# Patient Record
Sex: Female | Born: 1935
Health system: Southern US, Community
[De-identification: ages and names within clinical notes are randomized; demographics above are authoritative.]

## PROBLEM LIST (undated history)

## (undated) DIAGNOSIS — M810 Age-related osteoporosis without current pathological fracture: Principal | ICD-10-CM

## (undated) DIAGNOSIS — I447 Left bundle-branch block, unspecified: Secondary | ICD-10-CM

## (undated) DIAGNOSIS — K219 Gastro-esophageal reflux disease without esophagitis: Secondary | ICD-10-CM

## (undated) DIAGNOSIS — Z87442 Personal history of urinary calculi: Secondary | ICD-10-CM

## (undated) DIAGNOSIS — M069 Rheumatoid arthritis, unspecified: Secondary | ICD-10-CM

## (undated) DIAGNOSIS — I341 Nonrheumatic mitral (valve) prolapse: Secondary | ICD-10-CM

## (undated) DIAGNOSIS — E039 Hypothyroidism, unspecified: Secondary | ICD-10-CM

## (undated) DIAGNOSIS — M1990 Unspecified osteoarthritis, unspecified site: Secondary | ICD-10-CM

## (undated) DIAGNOSIS — N201 Calculus of ureter: Secondary | ICD-10-CM

## (undated) DIAGNOSIS — L719 Rosacea, unspecified: Secondary | ICD-10-CM

## (undated) DIAGNOSIS — T8859XA Other complications of anesthesia, initial encounter: Secondary | ICD-10-CM

## (undated) DIAGNOSIS — R002 Palpitations: Secondary | ICD-10-CM

## (undated) DIAGNOSIS — T4145XA Adverse effect of unspecified anesthetic, initial encounter: Secondary | ICD-10-CM

## (undated) HISTORY — PX: BACK SURGERY: SHX140

## (undated) HISTORY — DX: Palpitations: R00.2

## (undated) HISTORY — PX: CATARACT EXTRACTION W/ INTRAOCULAR LENS  IMPLANT, BILATERAL: SHX1307

## (undated) HISTORY — PX: KNEE ARTHROSCOPY: SHX127

## (undated) HISTORY — PX: OTHER SURGICAL HISTORY: SHX169

## (undated) HISTORY — DX: Age-related osteoporosis without current pathological fracture: M81.0

## (undated) HISTORY — DX: Unspecified osteoarthritis, unspecified site: M19.90

---
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
## 1968-05-27 HISTORY — PX: VAGINAL HYSTERECTOMY: SUR661

## 1992-07-27 ENCOUNTER — Encounter (INDEPENDENT_AMBULATORY_CARE_PROVIDER_SITE_OTHER): Payer: Self-pay | Admitting: *Deleted

## 1994-09-20 ENCOUNTER — Encounter (INDEPENDENT_AMBULATORY_CARE_PROVIDER_SITE_OTHER): Payer: Self-pay | Admitting: *Deleted

## 1997-02-01 ENCOUNTER — Encounter (INDEPENDENT_AMBULATORY_CARE_PROVIDER_SITE_OTHER): Payer: Self-pay | Admitting: *Deleted

## 1997-03-01 ENCOUNTER — Encounter (INDEPENDENT_AMBULATORY_CARE_PROVIDER_SITE_OTHER): Payer: Self-pay | Admitting: *Deleted

## 1997-03-21 ENCOUNTER — Encounter (INDEPENDENT_AMBULATORY_CARE_PROVIDER_SITE_OTHER): Payer: Self-pay | Admitting: *Deleted

## 1998-05-23 ENCOUNTER — Other Ambulatory Visit: Admission: RE | Admit: 1998-05-23 | Discharge: 1998-05-23 | Payer: Self-pay | Admitting: Obstetrics and Gynecology

## 1998-09-23 ENCOUNTER — Ambulatory Visit (HOSPITAL_COMMUNITY): Admission: RE | Admit: 1998-09-23 | Discharge: 1998-09-23 | Payer: Self-pay | Admitting: Neurosurgery

## 1998-09-23 ENCOUNTER — Encounter: Payer: Self-pay | Admitting: Neurosurgery

## 1999-05-31 ENCOUNTER — Other Ambulatory Visit: Admission: RE | Admit: 1999-05-31 | Discharge: 1999-05-31 | Payer: Self-pay | Admitting: Obstetrics and Gynecology

## 2000-09-22 ENCOUNTER — Other Ambulatory Visit: Admission: RE | Admit: 2000-09-22 | Discharge: 2000-09-22 | Payer: Self-pay | Admitting: Obstetrics and Gynecology

## 2001-07-16 ENCOUNTER — Ambulatory Visit (HOSPITAL_COMMUNITY): Admission: RE | Admit: 2001-07-16 | Discharge: 2001-07-16 | Payer: Self-pay | Admitting: Internal Medicine

## 2001-09-07 ENCOUNTER — Ambulatory Visit (HOSPITAL_COMMUNITY): Admission: RE | Admit: 2001-09-07 | Discharge: 2001-09-07 | Payer: Self-pay | Admitting: Internal Medicine

## 2001-09-07 ENCOUNTER — Encounter: Payer: Self-pay | Admitting: Internal Medicine

## 2003-05-28 HISTORY — PX: CHOLECYSTECTOMY: SHX55

## 2004-05-27 DIAGNOSIS — I341 Nonrheumatic mitral (valve) prolapse: Secondary | ICD-10-CM

## 2004-05-27 HISTORY — DX: Nonrheumatic mitral (valve) prolapse: I34.1

## 2005-02-20 ENCOUNTER — Ambulatory Visit: Payer: Self-pay | Admitting: Internal Medicine

## 2005-08-21 ENCOUNTER — Other Ambulatory Visit: Admission: RE | Admit: 2005-08-21 | Discharge: 2005-08-21 | Payer: Self-pay | Admitting: Obstetrics and Gynecology

## 2005-11-06 ENCOUNTER — Encounter: Payer: Self-pay | Admitting: Internal Medicine

## 2005-11-06 ENCOUNTER — Ambulatory Visit (HOSPITAL_COMMUNITY): Admission: RE | Admit: 2005-11-06 | Discharge: 2005-11-06 | Payer: Self-pay | Admitting: Internal Medicine

## 2005-11-06 ENCOUNTER — Ambulatory Visit: Payer: Self-pay | Admitting: Internal Medicine

## 2006-08-25 ENCOUNTER — Other Ambulatory Visit: Admission: RE | Admit: 2006-08-25 | Discharge: 2006-08-25 | Payer: Self-pay | Admitting: Obstetrics and Gynecology

## 2007-01-21 ENCOUNTER — Ambulatory Visit (HOSPITAL_COMMUNITY): Admission: RE | Admit: 2007-01-21 | Discharge: 2007-01-21 | Payer: Self-pay | Admitting: Rheumatology

## 2007-03-02 ENCOUNTER — Ambulatory Visit (HOSPITAL_COMMUNITY): Admission: RE | Admit: 2007-03-02 | Discharge: 2007-03-02 | Payer: Self-pay | Admitting: Rheumatology

## 2007-09-08 ENCOUNTER — Encounter: Admission: RE | Admit: 2007-09-08 | Discharge: 2007-09-08 | Payer: Self-pay | Admitting: Neurosurgery

## 2008-02-22 ENCOUNTER — Ambulatory Visit (HOSPITAL_COMMUNITY): Admission: RE | Admit: 2008-02-22 | Discharge: 2008-02-22 | Payer: Self-pay | Admitting: Rheumatology

## 2008-07-20 ENCOUNTER — Ambulatory Visit: Payer: Self-pay | Admitting: Obstetrics and Gynecology

## 2009-02-28 ENCOUNTER — Ambulatory Visit: Payer: Self-pay | Admitting: Obstetrics and Gynecology

## 2009-03-02 ENCOUNTER — Ambulatory Visit: Payer: Self-pay | Admitting: Obstetrics and Gynecology

## 2009-04-13 ENCOUNTER — Ambulatory Visit: Payer: Self-pay | Admitting: Obstetrics and Gynecology

## 2009-06-08 ENCOUNTER — Ambulatory Visit: Payer: Self-pay | Admitting: Obstetrics and Gynecology

## 2009-06-28 ENCOUNTER — Ambulatory Visit (HOSPITAL_COMMUNITY): Admission: RE | Admit: 2009-06-28 | Discharge: 2009-06-28 | Payer: Self-pay | Admitting: Rheumatology

## 2009-12-05 ENCOUNTER — Other Ambulatory Visit: Admission: RE | Admit: 2009-12-05 | Discharge: 2009-12-05 | Payer: Self-pay | Admitting: Obstetrics and Gynecology

## 2009-12-05 ENCOUNTER — Ambulatory Visit: Payer: Self-pay | Admitting: Obstetrics and Gynecology

## 2010-01-01 ENCOUNTER — Ambulatory Visit: Payer: Self-pay | Admitting: Obstetrics and Gynecology

## 2010-01-22 ENCOUNTER — Ambulatory Visit: Payer: Self-pay | Admitting: Internal Medicine

## 2010-01-22 DIAGNOSIS — K219 Gastro-esophageal reflux disease without esophagitis: Secondary | ICD-10-CM | POA: Insufficient documentation

## 2010-01-22 DIAGNOSIS — R1031 Right lower quadrant pain: Secondary | ICD-10-CM

## 2010-01-22 DIAGNOSIS — R1310 Dysphagia, unspecified: Secondary | ICD-10-CM | POA: Insufficient documentation

## 2010-01-22 DIAGNOSIS — K5900 Constipation, unspecified: Secondary | ICD-10-CM | POA: Insufficient documentation

## 2010-03-27 ENCOUNTER — Ambulatory Visit: Payer: Self-pay | Admitting: Internal Medicine

## 2010-03-30 ENCOUNTER — Encounter: Payer: Self-pay | Admitting: Internal Medicine

## 2010-05-27 HISTORY — PX: OTHER SURGICAL HISTORY: SHX169

## 2010-06-01 ENCOUNTER — Ambulatory Visit
Admission: RE | Admit: 2010-06-01 | Discharge: 2010-06-01 | Payer: Self-pay | Source: Home / Self Care | Attending: Obstetrics and Gynecology | Admitting: Obstetrics and Gynecology

## 2010-06-17 ENCOUNTER — Encounter: Payer: Self-pay | Admitting: Rheumatology

## 2010-06-26 NOTE — Assessment & Plan Note (Signed)
Summary: LOWER ABD PAIN/SHARP AT TIMES/YF   History of Present Illness Visit Type: Initial Consult Primary GI MD: Yancey Flemings MD Primary Provider: Charissa Bash, MD Requesting Provider: Edyth Gunnels, MD Chief Complaint: Abdominal pain, rt sided & lower History of Present Illness:   77 y.o. female w/ hypothyroidism, afib, osteoporosis, kindey stones, and GERD w/ peptic stricture. Remote GI care here [facility] '98) and more recent with Dr. Karilyn Cota in [place] ( Colonoscopy and EGD / dilation in 2003 and again in 2007 - REPORTS REVIEWED). Family hx colon cancer (mom 39 and aunt). Now sent re chronic right sided pain, recurrent dyspagia, and the need for colonoscopy. Could not get in to see Dr. Karilyn Cota until October, so sent here. Has had RLQ and right low back pain for several years. Gyn, GU, and NSU evals negative for cause. Small right ovary lesion stable and being followed - OUTSIDE RECORDS AND [place] REVIEWED. No exacerbating factors. Pain occurs most days. BM seems to help. Description is somewhat vague, though occasionally sharp.No polyps on prior colonoscopies. Tends to be constipated.. Takes Aciphex daily for GERD and Prev OTC for breakthrough. Now w/ recuurent solid food dysphagia for several months. Chronic medical problems stable.   GI Review of Systems    Reports abdominal pain, acid reflux, dysphagia with solids, and  nausea.     Location of  Abdominal pain: RLQ and back.    Denies belching, bloating, chest pain, dysphagia with liquids, heartburn, loss of appetite, vomiting, vomiting blood, weight loss, and  weight gain.      Reports constipation, diarrhea, and  hemorrhoids.     Denies anal fissure, black tarry stools, change in bowel habit, diverticulosis, fecal incontinence, heme positive stool, irritable bowel syndrome, jaundice, light color stool, liver problems, rectal bleeding, and  rectal pain. Preventive Screening-Counseling & Management  Alcohol-Tobacco     Smoking Status: never   Drug Use:  no.      Current Medications (verified): 1)  Lanoxin 0.125 Mg Tabs (Digoxin) .... Take 1/2 Tablet Once Daily 2)  Tenormin 25 Mg Tabs (Atenolol) .... Take 1/2 Tablet By Mouth Once Daily 3)  Aspirin 81 Mg Tbec (Aspirin) .... Once Daily 4)  Synthroid 75 Mcg Tabs (Levothyroxine Sodium) .... Once Daily 5)  Calcium-Vitamin D 600-125 Mg-Unit Tabs (Calcium-Vitamin D) .... Once Daily 6)  Centrum Silver  Tabs (Multiple Vitamins-Minerals) .... Once Daily 7)  Aciphex 20 Mg Tbec (Rabeprazole Sodium) .... Once Daily 8)  Vitamin D3 1000 Unit Tabs (Cholecalciferol) .... Once Daily 9)  Doxycycline Hyclate 100 Mg Tabs (Doxycycline Hyclate) .... Once Daily 10)  Tranxene-T 7.5 Mg Tabs (Clorazepate Dipotassium) .... At Bedtime As Needed 11)  Prevacid 30 Mg Cpdr (Lansoprazole) .... Once Daily  Allergies (verified): 1)  ! Penicillin  Past History:  Past Medical History: Osteoporosis Rt. Ovarian Neoplasm Arrhythmia Atrial Fibrillation Hypothyroidism Kidney Stones  Past Surgical History: Cholecystectomy Hysterectomy  Family History: Family History of Colon Cancer:Mother Stroke: Father  Social History: Occupation: Retired Patient has never smoked.  Alcohol Use - no Illicit Drug Use - no Smoking Status:  never Drug Use:  no  Review of Systems       The patient complains of arthritis/joint pain, back pain, fatigue, muscle pains/cramps, sleeping problems, swelling of feet/legs, urination - excessive, and urine leakage.  The patient denies allergy/sinus, anemia, anxiety-new, blood in urine, breast changes/lumps, change in vision, confusion, cough, coughing up blood, depression-new, fainting, fever, headaches-new, hearing problems, heart murmur, heart rhythm changes, itching, menstrual pain, night sweats, nosebleeds,  pregnancy symptoms, shortness of breath, skin rash, sore throat, swollen lymph glands, thirst - excessive , urination - excessive , urination changes/pain, vision changes, and  voice change.    Vital Signs:  Patient profile:   77 year old female Height:      65 inches Weight:      130.50 pounds BMI:     21.79 Pulse rate:   68 / minute Pulse rhythm:   irregular BP sitting:   102 / 66  (left arm) Cuff size:   regular  Vitals Entered By: June McMurray CMA Duncan Dull) (January 22, 2010 1:41 PM)  Physical Exam  General:  Well developed, well nourished, no acute distress. Head:  Normocephalic and atraumatic. Eyes:  PERRLA, no icterus. Mouth:  No deformity or lesions Neck:  Supple; no masses or thyromegaly. Lungs:  Clear throughout to auscultation. Heart:  Irregular rate with controlled rhythm; no murmurs, rubs,  or bruits. Abdomen:  Soft, nontender and nondistended. No masses, hepatosplenomegaly or hernias noted. Normal bowel sounds. Rectal:  deferred until colonoscopy Msk:  Symmetrical with no gross deformities. Normal posture. Pulses:  irregular Extremities:  No clubbing, cyanosis, edema or deformities noted. Neurologic:  Alert and  oriented x4;  grossly normal neurologically. Skin:  Intact without significant lesions or rashes. Cervical Nodes:  No significant cervical or supraclavicular adenopathy. Psych:  Alert and cooperative. Normal mood and affect.   Impression & Recommendations:  Problem # 1:  ABDOMINAL PAIN RIGHT LOWER QUADRANT (ICD-789.03) Chronic, stable, and nondescript. Suspect musculoskeletal or adhesions. Negative prior evaluations by other specialists. Somewhat better w/ BM.  PLAN: 1.Colonoscopy to r/o colonic / ileal cause (and provide CRC follow up screening) 2.Regulate bowels  Problem # 2:  FM HX MALIGNANT NEOPLASM GASTROINTESTINAL TRACT (ICD-V16.0) Due for follow up. Last exam 2007. DIFFICULT EXAM DESCRIBED.  PLAN: 1.COLONOSCOPY WITH PEDS SCOPE. Nature of procedure, risks,benefits,and alternatives reviewed. She understood and agreed to proceed. 2. Movi pep prescribed. Pt instructed on its use  Problem # 3:  CONSTIPATION  (ICD-564.00) 1. daily fiber 2. Miralax if needed  Problem # 4:  GERD (ICD-530.81) Some breakthough   PLAN: 1. Reflux precautions 2. Cont PPI  Problem # 5:  DYSPHAGIA UNSPECIFIED (ICD-787.20) Assessment: Deteriorated PLAN: 1. EGD with dilation. The nature of the procedure, R,B, and Alt. reviewed. She understood and agreed to proceed..  Other Orders: Colon/Endo (Colon/Endo)  Patient Instructions: 1)  Copy sent to : Charissa Bash, MD Edyth Gunnels, MD 2)  Colon/Endo [facility] 02/28/10 2:30 pm arrive at 1:30 pm 3)  MOvi prep instructions given to patient. 4)  Movi prep Rx. sent to pharmacy. 5)  Colonoscopy and Flexible Sigmoidoscopy brochure given.  6)  Upper Endoscopy brochure given.  7)  The medication list was reviewed and reconciled.  All changed / newly prescribed medications were explained.  A complete medication list was provided to the patient / caregiver. Prescriptions: MOVIPREP 100 GM  SOLR (PEG-KCL-NACL-NASULF-NA ASC-C) As per prep instructions.  #1 x 0   Entered by:   Milford Cage NCMA   Authorized by:   Hilarie Fredrickson MD   Signed by:   Milford Cage NCMA on 01/22/2010   Method used:   Electronically to        [facility]* (retail)       [address]       [place], [place]  [address]       Ph: [phone]  Fax: [phone]   RxID:   [id number]

## 2010-06-26 NOTE — Procedures (Signed)
Summary: Upper Endoscopy/[facility]  Upper Endoscopy/[facility]   Imported By: Sherian Rein 01/30/2010 12:19:51  _____________________________________________________________________  External Attachment:    Type:   Image     Comment:   External Document

## 2010-06-26 NOTE — Progress Notes (Signed)
Summary[facility]   Imported By: Sherian Rein 01/30/2010 12:22:50  _____________________________________________________________________  External Attachment:    Type:   Image     Comment:   External Document

## 2010-06-26 NOTE — Progress Notes (Signed)
Summary[facility]   Imported By: Sherian Rein 01/30/2010 12:23:50  _____________________________________________________________________  External Attachment:    Type:   Image     Comment:   External Document

## 2010-06-26 NOTE — Letter (Signed)
Summary: Patient Notice- Polyp Results  [facility]  [address] [place], [place] [address]   Phone: [phone]  Fax: [phone]        March 30, 2010 MRN: [id number]    Debra Richards [address] [place], [place]  [address]    Dear [name],  I am pleased to inform you that the colon polyp(s) removed during your recent colonoscopy was (were) found to be benign (no cancer detected) upon pathologic examination.    Additional information/recommendations:  __ No further action with gastroenterology is needed at this time. Please      follow-up with your primary care physician for your other healthcare      needs.    Please call us if you are having persistent problems or have questions about your condition that have not been fully answered at this time.  Sincerely,  Hilarie Fredrickson MD  This letter has been electronically signed by your physician.  Appended Document: Patient Notice- Polyp Results letter mailed

## 2010-06-26 NOTE — Procedures (Signed)
Summary: Upper Endoscopy  Patient: Debra Richards Note: All result statuses are Final unless otherwise noted.  Tests: (1) Upper Endoscopy (EGD)   EGD Upper Endoscopy       DONE     [facility]     [address]     [place], [place]  [address]           ENDOSCOPY PROCEDURE REPORT           PATIENT:  Debra Richards, Debra Richards  MR#:  [id number]     BIRTHDATE:  [birth year], 83 yrs. old  GENDER:  female           ENDOSCOPIST:  Wilhemina Bonito. Eda Keys, MD     Referred by:  Edyth Gunnels, M.D.           PROCEDURE DATE:  03/27/2010     PROCEDURE:  EGD, diagnostic 43235,     Maloney Dilation of Esophagus -71F     ASA CLASS:  Class II     INDICATIONS:  dysphagia, abdominal pain           MEDICATIONS:   There was residual sedation effect present from     prior procedure.     TOPICAL ANESTHETIC:  Exactacain Spray           DESCRIPTION OF PROCEDURE:   After the risks benefits and     alternatives of the procedure were thoroughly explained, informed     consent was obtained.  The LB GIF-H180 [id number] endoscope was     introduced through the mouth and advanced to the second portion of     the duodenum, without limitations.  The instrument was slowly     withdrawn as the mucosa was fully examined.     <<PROCEDUREIMAGES>>           A large caliber ring-like stricture was found in the distal     esophagus.  The stomach was entered and closely examined. The     antrum, angularis, and lesser curvature were well visualized,     including a retroflexed view of the cardia and fundus. The stomach     wall was normally distensable. The scope passed easily through the     pylorus into the duodenum.  The duodenal bulb was normal in     appearance, as was the postbulbar duodenum.    Retroflexed views     revealed no abnormalities.    The scope was then withdrawn from     the patient and the procedure completed.           THERAPY: 73F MALONEY DILATOR PASSED W/O RESITANCE OR HEME.  TOLERATED WELL           COMPLICATIONS:  None           ENDOSCOPIC IMPRESSION:     1) Stricture in the distal esophagus - S/P DILATION 73F     2) Normal stomach     3) Normal duodenum           RECOMMENDATIONS:     1) Clear liquids until 6PM, then soft foods rest of day. Resume     prior diet tomorrow.           ______________________________     Wilhemina Bonito. Eda Keys, MD           CC:  Carmelina Peal, MD, Werner Lean, MD, The Patient           n.     eSIGNED:  Wilhemina Bonito. Eda Keys at 03/27/2010 04:21 PM           Som-cook, Nicole Cella, [phone]  Note: An exclamation mark (!) indicates a result that was not dispersed into the flowsheet. Document Creation Date: 03/27/2010 4:21 PM _______________________________________________________________________  (1) Order result status: Final Collection or observation date-time: 03/27/2010 16:11 Requested date-time:  Receipt date-time:  Reported date-time:  Referring Physician:   Ordering Physician: Fransico Setters [phone]) Specimen Source:  Source: Launa Grill Order Number: [id number] Lab site:

## 2010-06-26 NOTE — Procedures (Signed)
Summary: EGD & Colonoscopy/[place]  EGD & Colonoscopy/[place]   Imported By: Sherian Rein 02/08/2010 12:04:00  _____________________________________________________________________  External Attachment:    Type:   Image     Comment:   External Document

## 2010-06-26 NOTE — Procedures (Signed)
Summary: Colonoscopy  Patient: Debra Richards Note: All result statuses are Final unless otherwise noted.  Tests: (1) Colonoscopy (COL)   COL Colonoscopy           [facility]      [facility]     [address]     [place], [place]  [address]           COLONOSCOPY PROCEDURE REPORT           PATIENT:  Debra Richards, Debra Richards  MR#:  [id number]     BIRTHDATE:  [birth year], 83 yrs. old  GENDER:  female     ENDOSCOPIST:  Wilhemina Bonito. Eda Keys, MD     REF. BY:  Edyth Gunnels, M.D.     PROCEDURE DATE:  03/27/2010     PROCEDURE:  Colonoscopy with snare polypectomy x 1     ASA CLASS:  Class II     INDICATIONS:  family history of colon cancer,  Elevated Risk     Screening - RLQ pain     MEDICATIONS:   Fentanyl 100 mcg IV, Versed 10 mg IV           DESCRIPTION OF PROCEDURE:   After the risks benefits and     alternatives of the procedure were thoroughly explained, informed     consent was obtained.  Digital rectal exam was performed and     revealed no abnormalities.   The LB CF-H180AL [id number] PEDS     endoscope was introduced through the anus and advanced to the     cecum, which was identified by both the appendix and ileocecal     valve, without limitations.Time to cecum = 14:29 min. The quality     of the prep was excellent, using MoviPrep.  The instrument was     then slowly withdrawn (time = 9:19 min) as the colon was fully     examined.     <<PROCEDUREIMAGES>>           FINDINGS:  The terminal ileum appeared normal.  A diminutive polyp     was found in the sigmoid colon. Polyp was snared without cautery.     Retrieval was successful.   Moderate diverticulosis was found in     the sigmoid colon.The rectosigmoid juction was narrowed and fixed     (likely due to adhesions and diverticular changes) and very     difficult to traverse.   Retroflexed views in the rectum revealed     no abnormalities.    The scope was then withdrawn from the patient     and the procedure completed.          COMPLICATIONS:  None     ENDOSCOPIC IMPRESSION:     1) Normal terminal ileum     2) Diminutive polyp in the sigmoid colon -removed     3) Moderate diverticulosis in the sigmoid colon     4) No cause for chronic pain found           RECOMMENDATIONS:     1) EGD today     2) Return to the care of your primary Wanda Rideout. GI follow up as     needed           ______________________________     Wilhemina Bonito. Eda Keys, MD           CC:  Carmelina Peal, MD; Werner Lean, MD; The Patient           n.  eSIGNED:   Wilhemina Bonito. Eda Keys at 03/27/2010 04:07 PM           Gartin-cook, Nicole Cella, [phone]  Note: An exclamation mark (!) indicates a result that was not dispersed into the flowsheet. Document Creation Date: 03/27/2010 4:08 PM _______________________________________________________________________  (1) Order result status: Final Collection or observation date-time: 03/27/2010 15:58 Requested date-time:  Receipt date-time:  Reported date-time:  Referring Physician:   Ordering Physician: Fransico Setters [phone]) Specimen Source:  Source: Launa Grill Order Number: [id number] Lab site:

## 2010-06-26 NOTE — Progress Notes (Signed)
[facility]   Imported By: Sherian Rein 01/30/2010 12:21:28  _____________________________________________________________________  External Attachment:    Type:   Image     Comment:   External Document

## 2010-06-26 NOTE — Procedures (Signed)
Summary: Colonoscopy & EGD/[place]  Colonoscopy & EGD/[place]   Imported By: Sherian Rein 02/08/2010 12:02:32  _____________________________________________________________________  External Attachment:    Type:   Image     Comment:   External Document

## 2010-06-26 NOTE — Procedures (Signed)
Summary: Colonoscopy/[place]  Colonoscopy/[place]   Imported By: Sherian Rein 01/30/2010 12:17:45  _____________________________________________________________________  External Attachment:    Type:   Image     Comment:   External Document

## 2010-06-26 NOTE — Letter (Signed)
Summary: [facility] Instructions  [facility]  [address] [place], [place] [address]   Phone: [phone]  Fax: [phone]       Debra Richards    12/11/1947    MRN: [id number]        Procedure Day /Date:WEDNESDAY, 02/28/10     Arrival Time:1:30 PM     Procedure Time:2:30 PM     Location of Procedure:                    X  [facility] (4th Floor)   PREPARATION FOR COLONOSCOPY WITH MOVIPREP/ENDO   Starting 5 days prior to your procedure 02/23/10 do not eat nuts, seeds, popcorn, corn, beans, peas,  salads, or any raw vegetables.  Do not take any fiber supplements (e.g. Metamucil, Citrucel, and Benefiber).  THE DAY BEFORE YOUR PROCEDURE         DATE: 02/27/10  DAY: TUESDAY  1.  Drink clear liquids the entire day-NO SOLID FOOD  2.  Do not drink anything colored red or purple.  Avoid juices with pulp.  No orange juice.  3.  Drink at least 64 oz. (8 glasses) of fluid/clear liquids during the day to prevent dehydration and help the prep work efficiently.  CLEAR LIQUIDS INCLUDE: Water Jello Ice Popsicles Tea (sugar ok, no milk/cream) Powdered fruit flavored drinks Coffee (sugar ok, no milk/cream) Gatorade Juice: apple, white grape, white cranberry  Lemonade Clear bullion, consomm, broth Carbonated beverages (any kind) Strained chicken noodle soup Hard Candy                             4.  In the morning, mix first dose of MoviPrep solution:    Empty 1 Pouch A and 1 Pouch B into the disposable container    Add lukewarm drinking water to the top line of the container. Mix to dissolve    Refrigerate (mixed solution should be used within 24 hrs)  5.  Begin drinking the prep at 5:00 p.m. The MoviPrep container is divided by 4 marks.   Every 15 minutes drink the solution down to the next mark (approximately 8 oz) until the full liter is complete.   6.  Follow completed prep with 16 oz of clear liquid of your choice (Nothing red or  purple).  Continue to drink clear liquids until bedtime.  7.  Before going to bed, mix second dose of MoviPrep solution:    Empty 1 Pouch A and 1 Pouch B into the disposable container    Add lukewarm drinking water to the top line of the container. Mix to dissolve    Refrigerate  THE DAY OF YOUR PROCEDURE      DATE: 02/28/10 DAY: WEDNESDAY  Beginning at 9:30 a.m. (5 hours before procedure):         1. Every 15 minutes, drink the solution down to the next mark (approx 8 oz) until the full liter is complete.  2. Follow completed prep with 16 oz. of clear liquid of your choice.    3. You may drink clear liquids until 12:30 PM (2 HOURS BEFORE PROCEDURE).   MEDICATION INSTRUCTIONS  Unless otherwise instructed, you should take regular prescription medications with a small sip of water   as early as possible the morning of your procedure.           OTHER INSTRUCTIONS  You will need a responsible adult at least 75 years  of age to accompany you and drive you home.   This person must remain in the waiting room during your procedure.  Wear loose fitting clothing that is easily removed.  Leave jewelry and other valuables at home.  However, you may wish to bring a book to read or  an iPod/MP3 player to listen to music as you wait for your procedure to start.  Remove all body piercing jewelry and leave at home.  Total time from sign-in until discharge is approximately 2-3 hours.  You should go home directly after your procedure and rest.  You can resume normal activities the  day after your procedure.  The day of your procedure you should not:   Drive   Make legal decisions   Operate machinery   Drink alcohol   Return to work  You will receive specific instructions about eating, activities and medications before you leave.    The above instructions have been reviewed and explained to me by   _______________________    I fully understand and can verbalize these  instructions _____________________________ Date _________

## 2010-09-24 ENCOUNTER — Ambulatory Visit (HOSPITAL_COMMUNITY)
Admission: RE | Admit: 2010-09-24 | Discharge: 2010-09-24 | Disposition: A | Discharge status: Home / Self Care | Payer: Medicare Other | Source: Ambulatory Visit | Attending: Urology | Admitting: Urology

## 2010-09-24 DIAGNOSIS — N201 Calculus of ureter: Secondary | ICD-10-CM | POA: Insufficient documentation

## 2010-09-24 DIAGNOSIS — R1084 Generalized abdominal pain: Secondary | ICD-10-CM | POA: Insufficient documentation

## 2010-09-24 DIAGNOSIS — K219 Gastro-esophageal reflux disease without esophagitis: Secondary | ICD-10-CM | POA: Insufficient documentation

## 2010-09-24 DIAGNOSIS — R310 Gross hematuria: Secondary | ICD-10-CM | POA: Insufficient documentation

## 2010-09-24 HISTORY — PX: EXTRACORPOREAL SHOCK WAVE LITHOTRIPSY: SHX1557

## 2010-10-12 NOTE — Op Note (Signed)
[facility]  Patient:    Debra Richards, Debra Richards Visit Number: [id number] MRN: [id number]          Service Type: END Location: DAY Attending Physician:  Malissa Hippo Dictated by:   Lionel December, M.D. Proc. Date: 09/07/01 Admit Date:  09/07/2001   CC:         Dr. Kathlene November   Operative Report  PROCEDURE:  Total colonoscopy, followed by esophagogastroduodenoscopy with esophageal dilatation.  INDICATIONS:  Debra Richards is a 77 year old Caucasian female with history of abdominal pain, constipation, and hematochezia recently.  Family history is positive for colon carcinoma in a mother.  Her last colonoscopy was five years ago in [place].  She is felt to be ______ and therefore undergoing examination, both for diagnostic and screening purposes.  She also has symptoms of GERD and persistent dysphagia.  She had EGD with ED about six weeks ago at this facility.  She had esophageal web and distal esophageal ring.  Both of the areas was disrupted by passing 54 [place] dilator. However, she is not feeling any better, and she has had a barium study performed at [facility], which reveals some narrowing of the GE junction.  She would like to have her esophagus stretched again.  Both of the procedures were reviewed with the patient, and informed consent was obtained.  PREOPERATIVE MEDICATIONS:  Demerol 50 mg IV, Versed 5 mg IV, Cetacaine spray for oropharyngeal topical anesthesia.  INSTRUMENT:  Olympus video system.  FINDINGS:  Procedures performed in endoscopy suite.  The patients vital signs and O2 sat were monitored during the procedure and remained stable.  PROCEDURE #1:  Total colonoscopy.  Rectal examination performed. This was within normal limits.  The scope was placed in the rectum with observation of sigmoid colon beyond.  A few diverticula were noted.  Sigmoid colon: Preparation was excellent.  The scope was passed through the cecum, which was  identified by appendiceal orifice and ileocecal valve.  As the scope was withdrawn, the mucosa was carefully examined, and no polyps and/or tumor masses were noted.  Rectal mucosa was normal. The scope was retroflexed.  Examined anorectal junction, and small hemorrhoids were noted below the dentate line.  The endoscope was withdrawn, and the patient was prepared for procedure #2.  PROCEDURE #2:  Esophagogastroduodenoscopy.  Endoscope was passed ______ difficulty into the esophagus.  Esophagus:  The mucosa of the esophagus was normal.  The squamocolumnar junction was unremarkable.  There was no obvious ring or stricture.  There was a small sliding hiatal hernia.  Stomach:  Examination of the stomach was normal.  The pyloric channel was patent.  Duodenum:  The duodenum was not examined.  Esophageal dilatation was performed using a 20 mm balloon.  The balloon dilator was positioned across the LES and insufflated to a diameter of 20 mm and maintained for about 2 minutes.  As the balloon dilator was deflated, this segment was re-examined.  There was a very superficial mucosal tear just above the GE junction. The stomach was decompressed, and the endoscope was withdrawn.  The patient tolerated the procedure well.  FINAL DIAGNOSES: 1. A few sigmoid colon diverticula and small external hemorrhoids; otherwise    normal total colonoscopy. 2. Small sliding hiatal hernia.  No evidence of ring or stricture.  Distal    esophagus/GE junction was dilated with a 20 mm balloon.  RECOMMENDATIONS:  She will continue antireflux measures, Prevacid, and ______ as before.  She will also be on a high-fiber diet  and continue Lactulose.  She will call us with a progress report in two weeks. Dictated by:   Lionel December, M.D. Attending Physician:  Malissa Hippo DD:  09/07/01 TD:  09/08/01 Job: [id number]

## 2010-10-12 NOTE — Op Note (Signed)
[name], BEFORT        ACCOUNT NO.:  [id number]   MEDICAL RECORD NO.:  [id number]          PATIENT TYPE:  AMB   LOCATION:  DAY                           FACILITY:  [facility]   PHYSICIAN:  Lionel December, M.D.    DATE OF BIRTH:  28-Aug-1925   DATE OF PROCEDURE:  11/06/2005  DATE OF DISCHARGE:                                 OPERATIVE REPORT   PROCEDURE:  Esophagogastroduodenoscopy with esophageal dilation followed by  colonoscopy.   INDICATIONS:  Debra Richards is a 77 year old Caucasian female who had an episode  of rectal bleeding one month ago described as moderate, large amount of  blood resolved spontaneously.  She was seen by Dr. Doyne Keel and colonoscopy  was requested.  She also complains of right lower quadrant pain.  She has  history of irritable bowel syndrome.  She also complains of bloating and  epigastric pain and weight loss, to evaluate for dysphagia.  At that time  EGD was recommended but she wanted to wait.  Now that she is here for  colonoscopy, she would like to get her upper GI tract evaluated, too.  Both  procedures were reviewed with the patient and informed consent was obtained.   FAMILY HISTORY:  Significant for colon carcinoma in her mother in her early  62s and maternal aunt in her mid 17s.  The patient's last colonoscopy was in  2003.   PREMEDICATION:  For conscious sedation, benzocaine spray for pharyngeal  topical anesthesia, Demerol 50 mg IV, Versed 6 mg IV in divided doses.   FINDINGS:  The procedures were performed in the endoscopy suite.  The  patient's vital signs and O2 saturation were monitored during the procedure  and remained stable.   ESOPHAGOGASTRODUODENOSCOPY:  The patient was placed in the left lateral  recumbent position and Olympus videoscope was passed through the oropharynx  without any difficulty into the esophagus.  Esophagus:  Mucosa of the esophagus was normal.  The GE junction was at 40  cm with inconspicuous, noncritical ring.   No hernia was noted.  Stomach:  It was empty and distended very well on insufflation.  Folds of  the proximal stomach were normal.  Examination of the mucosa revealed antral  erythema with no erosions or ulcers noted.  Pyloric channel was patent.  Angularis, fundus and cardia were examined by retroflexing the scope and  were normal.  Duodenum:  Duodenal bulb and post bulbar duodenum was normal.  The scope was  passed to the second part of the duodenum.  The mucosa and folds were  normal.   The endoscope was withdrawn and the esophagus was dilated by passing 54-  [place] dilator to full insertion.  After the dilator was withdrawn,  endoscope was passed in and there was a partial tear of the cervical  esophagus.  Part of the web was seen.  There was much smaller tear at the GE  junction.  Pictures were taken for right record.  Endoscope was withdrawn.   Patient was prepared for procedure #2.   COLONOSCOPY:  Rectal examination performed.  No abnormality noted on the  external or digital exam.  Olympus  videoscope was placed in the rectum and  advanced into the sigmoid colon which is very tortuous and noncompliant.  The scope was slowly and carefully advanced proximally.  Scattered  diverticula were noted in the sigmoid colon.  Preparation was satisfactory.  She had a lot of liquid stool which was suctioned out with the scope.  Using  abdominal pressure in different positions, scope was passed further and  finally into the cecum.  Cecum was identified by the ileocecal valve and  appendiceal orifice.  Pictures were taken for the record.  As the scope was  withdrawn, colonic mucosa was carefully examined.  It was normal throughout.  Rectal mucosa was normal.  Scope was retroflexed, examined the anorectal  junction.  Small hemorrhoids were noted below the dentate line.  Endoscope  was straightened and withdrawn. The patient tolerated the procedure fairly  well.   FINAL DIAGNOSIS:  1.   Noncritical distal esophageal ring.  Both of these were disrupted by      passing 54-[place] dilator.  2.  Nonerosive antral gastritis.  3.  A few scattered diverticula sigmoid colon.  4.  External hemorrhoids.  5.  Suspect recent episode of rectal bleeding could be secondary to      diverticular bleeding.   RECOMMENDATIONS:  1.  She will resume her usual medications and diet.  She needs to be on high      fiber diet and fiber supplement, 3-4 g per day.  2.  Will check her H pylori serology today.      Lionel December, M.D.  Electronically Signed     NR/MEDQ  D:  11/06/2005  T:  11/06/2005  Job:  [id number]   cc:   Wende Crease, MD

## 2010-10-30 ENCOUNTER — Ambulatory Visit (INDEPENDENT_AMBULATORY_CARE_PROVIDER_SITE_OTHER): Payer: Medicare Other | Admitting: Gynecology

## 2010-10-30 DIAGNOSIS — N898 Other specified noninflammatory disorders of vagina: Secondary | ICD-10-CM

## 2010-10-30 DIAGNOSIS — R82998 Other abnormal findings in urine: Secondary | ICD-10-CM

## 2010-10-30 DIAGNOSIS — B373 Candidiasis of vulva and vagina: Secondary | ICD-10-CM

## 2010-12-11 ENCOUNTER — Other Ambulatory Visit: Payer: Medicare Other

## 2010-12-11 ENCOUNTER — Ambulatory Visit (INDEPENDENT_AMBULATORY_CARE_PROVIDER_SITE_OTHER): Payer: Medicare Other | Admitting: Obstetrics and Gynecology

## 2010-12-11 DIAGNOSIS — N8300 Follicular cyst of ovary, unspecified side: Secondary | ICD-10-CM

## 2010-12-11 DIAGNOSIS — N949 Unspecified condition associated with female genital organs and menstrual cycle: Secondary | ICD-10-CM

## 2010-12-11 DIAGNOSIS — D3910 Neoplasm of uncertain behavior of unspecified ovary: Secondary | ICD-10-CM

## 2011-03-04 ENCOUNTER — Other Ambulatory Visit (HOSPITAL_COMMUNITY): Payer: Self-pay | Admitting: Orthopedic Surgery

## 2011-03-04 DIAGNOSIS — M1710 Unilateral primary osteoarthritis, unspecified knee: Secondary | ICD-10-CM

## 2011-03-04 DIAGNOSIS — M545 Low back pain: Secondary | ICD-10-CM

## 2011-03-05 ENCOUNTER — Ambulatory Visit (HOSPITAL_COMMUNITY)
Admission: RE | Admit: 2011-03-05 | Discharge: 2011-03-05 | Disposition: A | Discharge status: Home / Self Care | Payer: Medicare Other | Source: Ambulatory Visit | Attending: Orthopedic Surgery | Admitting: Orthopedic Surgery

## 2011-03-05 DIAGNOSIS — M545 Low back pain, unspecified: Secondary | ICD-10-CM | POA: Insufficient documentation

## 2011-03-05 DIAGNOSIS — M5137 Other intervertebral disc degeneration, lumbosacral region: Secondary | ICD-10-CM | POA: Insufficient documentation

## 2011-03-05 DIAGNOSIS — M5126 Other intervertebral disc displacement, lumbar region: Secondary | ICD-10-CM | POA: Insufficient documentation

## 2011-03-05 DIAGNOSIS — M1710 Unilateral primary osteoarthritis, unspecified knee: Secondary | ICD-10-CM

## 2011-03-05 DIAGNOSIS — M51379 Other intervertebral disc degeneration, lumbosacral region without mention of lumbar back pain or lower extremity pain: Secondary | ICD-10-CM | POA: Insufficient documentation

## 2011-03-05 DIAGNOSIS — M79609 Pain in unspecified limb: Secondary | ICD-10-CM | POA: Insufficient documentation

## 2011-03-07 ENCOUNTER — Other Ambulatory Visit (HOSPITAL_COMMUNITY): Payer: Medicare Other

## 2011-03-12 ENCOUNTER — Other Ambulatory Visit (HOSPITAL_COMMUNITY): Payer: Medicare Other

## 2011-04-12 ENCOUNTER — Encounter (HOSPITAL_BASED_OUTPATIENT_CLINIC_OR_DEPARTMENT_OTHER): Payer: Self-pay | Admitting: *Deleted

## 2011-04-12 NOTE — Progress Notes (Signed)
NPO AFTER MN WITH EXCEPTION CLEAR LIQUIDS (NO MILK/CREAM PRODUCTS) UNTIL 0800. PT TO ARRIVE 1200. NEEDS ISTAT. EKG AND NOTE TO BE FAXED FROM DR Alanda Amass.  WILL TAKES TENORMIN, ACIPHEX AND LANOXIN AM OF SURG. W/ SIPS OF WATER.

## 2011-04-16 ENCOUNTER — Encounter (HOSPITAL_BASED_OUTPATIENT_CLINIC_OR_DEPARTMENT_OTHER): Payer: Self-pay | Admitting: Orthopedic Surgery

## 2011-04-16 NOTE — H&P (Signed)
CC- Debra Richards is a 77 y.o. female who presents with right knee pain.  HPI- . Knee Pain: Patient presents with knee pain involving the  right knee. Onset of the symptoms was several months ago. Inciting event: none known. Current symptoms include giving out, locking, pain located laterally, stiffness and swelling. Pain is aggravated by any weight bearing, going up and down stairs, inactivity, squatting and walking.  Patient has had no prior knee problems. Evaluation to date: plain films: abnormal with slight lateral joint space narrowing and MRI: abnormal with lateral meniscal tear. Treatment to date: avoidance of offending activity, corticosteroid injection which was somewhat effective and rest.  Past Medical History  Diagnosis Date  . Fibroid   . Right ovarian cyst     MONITORED BY DR Eda Paschal  . History of kidney stones   . Rosacea   . Hypothyroidism   . MVP (mitral valve prolapse) MILD  . RA (rheumatoid arthritis)     followed by dr Kellie Simmering  . Right ureteral stone   . Coronary artery disease     CARDIOLOGIST- DR Alanda Amass-- LAST VISIT 04-04-11 (will request note)---   (per note cardiolite 2009 negative,  echo jan.2010  . Dysrhythmia     HX SVT AND PAF--- occ. palpitations  . LBBB (left bundle branch block) CHRONIC  . GERD (gastroesophageal reflux disease)     CONTROLLED W/ ACIPHEX AND prn prevacid  . Lateral meniscal tear right knee    pain and swelling  . Edema occasional in ankles    Past Surgical History  Procedure Date  . Cholecystectomy 2005  . Moles excised   . Extracorporeal shock wave lithotripsy 09-24-10    and 1990  . Vaginal hysterectomy 1970  . Cataract extraction w/ intraocular lens  implant, bilateral     Prior to Admission medications   Medication Sig Start Date End Date Taking? Authorizing Provider  aspirin 81 MG chewable tablet Chew 81 mg by mouth daily.    Yes Historical Provider, MD  Atenolol (TENORMIN PO) Take 25 mg by mouth every  morning. NO DOSE NOTED. TAKES 1/2 TAB DAILY.   Yes Historical Provider, MD  calcium carbonate (OS-CAL) 600 MG TABS Take 600 mg by mouth 2 (two) times daily with a meal.    Yes Historical Provider, MD  cholecalciferol (VITAMIN D) 1000 UNITS tablet Take 1,000 Units by mouth daily.    Yes Historical Provider, MD  clorazepate (TRANXENE) 7.5 MG tablet Take 7.5 mg by mouth 2 (two) times daily as needed.    Yes Historical Provider, MD  digoxin (LANOXIN) 0.125 MG tablet Take 125 mcg by mouth every morning. Takes 1/2 tablet    Yes Historical Provider, MD  doxycycline (VIBRAMYCIN) 100 MG capsule Take 100 mg by mouth 2 (two) times daily.    Yes Historical Provider, MD  HYDROcodone-acetaminophen (NORCO) 5-325 MG per tablet Take 1 tablet by mouth every 6 (six) hours as needed.     Yes Historical Provider, MD  lansoprazole (PREVACID) 30 MG capsule Take 30 mg by mouth as needed.     Yes Historical Provider, MD  levothyroxine (SYNTHROID, LEVOTHROID) 75 MCG tablet Take 75 mcg by mouth every evening.    Yes Historical Provider, MD  methotrexate (RHEUMATREX) 2.5 MG tablet Take 2.5 mg by mouth once a week. Caution:Chemotherapy. Protect from light.    Yes Historical Provider, MD  Multiple Vitamin (MULTIVITAMIN PO) Take by mouth daily.    Yes Historical Provider, MD  RABEprazole (ACIPHEX) 20 MG tablet Take  20 mg by mouth every morning.    Yes Historical Provider, MD     KNEE EXAM--antalgic gait, soft tissue tenderness over lateral joint line, effusion, negative drawer sign, negative pivot-shift, collateral ligaments intact, negative Lachman sign, normal ipsilateral hip exam, normal contralateral knee exam  Physical Examination: General appearance - alert, well appearing, and in no distress and oriented to person, place, and time Mental status - alert, oriented to person, place, and time, normal mood, behavior, speech, dress, motor activity, and thought processes Neck - supple, no significant adenopathy Chest - clear to  auscultation, no wheezes, rales or rhonchi, symmetric air entry Heart - normal rate, regular rhythm, normal S1, S2, no murmurs, rubs, clicks or gallops Abdomen - soft, nontender, nondistended, no masses or organomegaly Neurological - alert, oriented, normal speech, no focal findings or movement disorder noted Skin - normal coloration and turgor, no rashes, no suspicious skin lesions noted   Asessment/Plan--- Rightt knee lateral meniscal tear- - Plan rightt knee arthroscopy with meniscal debridement. Procedure risks and potential comps discussed with patient who elects to proceed. Goals are decreased pain and increased function with a high likelihood of achieving both   Gus Rankin. Issam Carlyon, MD    04/16/2011, 11:23 PM

## 2011-04-17 ENCOUNTER — Encounter (HOSPITAL_BASED_OUTPATIENT_CLINIC_OR_DEPARTMENT_OTHER): Payer: Self-pay | Admitting: Anesthesiology

## 2011-04-17 ENCOUNTER — Encounter (HOSPITAL_BASED_OUTPATIENT_CLINIC_OR_DEPARTMENT_OTHER): Payer: Self-pay | Admitting: Orthopedic Surgery

## 2011-04-17 ENCOUNTER — Encounter (HOSPITAL_BASED_OUTPATIENT_CLINIC_OR_DEPARTMENT_OTHER): Payer: Self-pay | Admitting: *Deleted

## 2011-04-17 ENCOUNTER — Ambulatory Visit (HOSPITAL_BASED_OUTPATIENT_CLINIC_OR_DEPARTMENT_OTHER): Payer: Medicare Other | Admitting: Anesthesiology

## 2011-04-17 ENCOUNTER — Encounter (HOSPITAL_BASED_OUTPATIENT_CLINIC_OR_DEPARTMENT_OTHER)
Admission: RE | Disposition: A | Discharge status: Home / Self Care | Payer: Self-pay | Source: Ambulatory Visit | Attending: Orthopedic Surgery

## 2011-04-17 ENCOUNTER — Ambulatory Visit (HOSPITAL_BASED_OUTPATIENT_CLINIC_OR_DEPARTMENT_OTHER)
Admission: RE | Admit: 2011-04-17 | Discharge: 2011-04-17 | Disposition: A | Discharge status: Home / Self Care | Payer: Medicare Other | Source: Ambulatory Visit | Attending: Orthopedic Surgery | Admitting: Orthopedic Surgery

## 2011-04-17 DIAGNOSIS — K219 Gastro-esophageal reflux disease without esophagitis: Secondary | ICD-10-CM | POA: Insufficient documentation

## 2011-04-17 DIAGNOSIS — Z79899 Other long term (current) drug therapy: Secondary | ICD-10-CM | POA: Insufficient documentation

## 2011-04-17 DIAGNOSIS — I447 Left bundle-branch block, unspecified: Secondary | ICD-10-CM | POA: Insufficient documentation

## 2011-04-17 DIAGNOSIS — M23359 Other meniscus derangements, posterior horn of lateral meniscus, unspecified knee: Secondary | ICD-10-CM | POA: Insufficient documentation

## 2011-04-17 DIAGNOSIS — E039 Hypothyroidism, unspecified: Secondary | ICD-10-CM | POA: Insufficient documentation

## 2011-04-17 DIAGNOSIS — Z7982 Long term (current) use of aspirin: Secondary | ICD-10-CM | POA: Insufficient documentation

## 2011-04-17 DIAGNOSIS — I2510 Atherosclerotic heart disease of native coronary artery without angina pectoris: Secondary | ICD-10-CM | POA: Insufficient documentation

## 2011-04-17 DIAGNOSIS — M25569 Pain in unspecified knee: Secondary | ICD-10-CM | POA: Insufficient documentation

## 2011-04-17 HISTORY — DX: Gastro-esophageal reflux disease without esophagitis: K21.9

## 2011-04-17 HISTORY — DX: Rosacea, unspecified: L71.9

## 2011-04-17 HISTORY — DX: Personal history of urinary calculi: Z87.442

## 2011-04-17 HISTORY — DX: Hypothyroidism, unspecified: E03.9

## 2011-04-17 HISTORY — DX: Calculus of ureter: N20.1

## 2011-04-17 HISTORY — DX: Nonrheumatic mitral (valve) prolapse: I34.1

## 2011-04-17 HISTORY — DX: Left bundle-branch block, unspecified: I44.7

## 2011-04-17 HISTORY — DX: Rheumatoid arthritis, unspecified: M06.9

## 2011-04-17 LAB — POCT I-STAT 4, (NA,K, GLUC, HGB,HCT)
Hemoglobin: 13.9 g/dL (ref 12.0–15.0)
Potassium: 4 mEq/L (ref 3.5–5.1)

## 2011-04-17 SURGERY — ARTHROSCOPY, KNEE
Anesthesia: Choice | Laterality: Right

## 2011-04-17 MED ORDER — METHOCARBAMOL 500 MG PO TABS
500.0000 mg | ORAL_TABLET | Freq: Three times a day (TID) | ORAL | Status: DC
  Administered 2011-04-17: 500 mg via ORAL

## 2011-04-17 MED ORDER — LIDOCAINE HCL (CARDIAC) 20 MG/ML IV SOLN
INTRAVENOUS | Status: DC | PRN
  Administered 2011-04-17: 50 mg via INTRAVENOUS

## 2011-04-17 MED ORDER — DEXAMETHASONE SODIUM PHOSPHATE 4 MG/ML IJ SOLN
INTRAMUSCULAR | Status: DC | PRN
  Administered 2011-04-17: 4 mg via INTRAVENOUS

## 2011-04-17 MED ORDER — FENTANYL CITRATE 0.05 MG/ML IJ SOLN
INTRAMUSCULAR | Status: DC | PRN
  Administered 2011-04-17 (×4): 25 ug via INTRAVENOUS

## 2011-04-17 MED ORDER — METHOCARBAMOL 100 MG/ML IJ SOLN: 500.0000 mg | Freq: Three times a day (TID) | INTRAVENOUS | Status: DC

## 2011-04-17 MED ORDER — CEFAZOLIN SODIUM 1-5 GM-% IV SOLN
1.0000 g | Freq: Once | INTRAVENOUS | Status: AC
  Administered 2011-04-17: 1 g via INTRAVENOUS

## 2011-04-17 MED ORDER — OXYCODONE-ACETAMINOPHEN 5-325 MG PO TABS: 1.0000 | ORAL_TABLET | ORAL | Status: AC | PRN

## 2011-04-17 MED ORDER — LACTATED RINGERS IV SOLN: INTRAVENOUS | Status: DC

## 2011-04-17 MED ORDER — BUPIVACAINE-EPINEPHRINE 0.25% -1:200000 IJ SOLN
INTRAMUSCULAR | Status: DC | PRN
  Administered 2011-04-17: 20 mL

## 2011-04-17 MED ORDER — PROMETHAZINE HCL 25 MG/ML IJ SOLN: 6.2500 mg | INTRAMUSCULAR | Status: DC | PRN

## 2011-04-17 MED ORDER — SODIUM CHLORIDE 0.9 % IR SOLN
Status: DC | PRN
  Administered 2011-04-17: 9000 mL

## 2011-04-17 MED ORDER — ONDANSETRON HCL 4 MG/2ML IJ SOLN
INTRAMUSCULAR | Status: DC | PRN
  Administered 2011-04-17: 4 mg via INTRAVENOUS

## 2011-04-17 MED ORDER — OXYCODONE-ACETAMINOPHEN 5-325 MG PO TABS
1.0000 | ORAL_TABLET | ORAL | Status: DC | PRN
  Administered 2011-04-17: 1 via ORAL

## 2011-04-17 MED ORDER — METHOCARBAMOL 500 MG PO TABS: 500.0000 mg | ORAL_TABLET | Freq: Four times a day (QID) | ORAL | Status: AC

## 2011-04-17 MED ORDER — PROPOFOL 10 MG/ML IV EMUL
INTRAVENOUS | Status: DC | PRN
  Administered 2011-04-17: 150 mg via INTRAVENOUS

## 2011-04-17 MED ORDER — LACTATED RINGERS IV SOLN
INTRAVENOUS | Status: DC
  Administered 2011-04-17: 100 mL/h via INTRAVENOUS

## 2011-04-17 MED ORDER — FENTANYL CITRATE 0.05 MG/ML IJ SOLN
25.0000 ug | INTRAMUSCULAR | Status: DC | PRN
  Administered 2011-04-17 (×2): 25 ug via INTRAVENOUS

## 2011-04-17 MED ORDER — CHLORHEXIDINE GLUCONATE 4 % EX LIQD: 60.0000 mL | Freq: Once | CUTANEOUS | Status: DC

## 2011-04-17 SURGICAL SUPPLY — 36 items
BANDAGE ELASTIC 6 VELCRO ST LF (GAUZE/BANDAGES/DRESSINGS) ×2 IMPLANT
BLADE 4.2CUDA (BLADE) ×2 IMPLANT
BLADE CUDA SHAVER 3.5 (BLADE) IMPLANT
BLADE CUTTER GATOR 3.5 (BLADE) IMPLANT
CANISTER SUCT LVC 12 LTR MEDI- (MISCELLANEOUS) ×2 IMPLANT
CANISTER SUCTION 2500CC (MISCELLANEOUS) IMPLANT
CLOTH BEACON ORANGE TIMEOUT ST (SAFETY) ×2 IMPLANT
DRAPE ARTHROSCOPY W/POUCH 114 (DRAPES) ×2 IMPLANT
DRSG EMULSION OIL 3X3 NADH (GAUZE/BANDAGES/DRESSINGS) ×2 IMPLANT
DURAPREP 26ML APPLICATOR (WOUND CARE) ×2 IMPLANT
ELECT MENISCUS 165MM 90D (ELECTRODE) IMPLANT
ELECT REM PT RETURN 9FT ADLT (ELECTROSURGICAL)
ELECTRODE REM PT RTRN 9FT ADLT (ELECTROSURGICAL) IMPLANT
GLOVE BIO SURGEON STRL SZ 6 (GLOVE) ×1 IMPLANT
GLOVE BIO SURGEON STRL SZ8 (GLOVE) ×2 IMPLANT
GLOVE ECLIPSE 6.0 STRL STRAW (GLOVE) ×1 IMPLANT
GLOVE INDICATOR 8.0 STRL GRN (GLOVE) ×2 IMPLANT
GOWN PREVENTION PLUS LG XLONG (DISPOSABLE) ×2 IMPLANT
GOWN STRL NON-REIN LRG LVL3 (GOWN DISPOSABLE) ×2 IMPLANT
IV NS IRRIG 3000ML ARTHROMATIC (IV SOLUTION) ×6 IMPLANT
KNEE WRAP E Z 3 GEL PACK (MISCELLANEOUS) ×2 IMPLANT
PACK ARTHROSCOPY DSU (CUSTOM PROCEDURE TRAY) ×2 IMPLANT
PACK BASIN DAY SURGERY FS (CUSTOM PROCEDURE TRAY) ×2 IMPLANT
PADDING CAST ABS 4INX4YD NS (CAST SUPPLIES) ×1
PADDING CAST ABS COTTON 4X4 ST (CAST SUPPLIES) ×1 IMPLANT
PADDING CAST COTTON 6X4 STRL (CAST SUPPLIES) ×2 IMPLANT
PADDING WEBRIL 4 STERILE (GAUZE/BANDAGES/DRESSINGS) ×1 IMPLANT
PENCIL BUTTON HOLSTER BLD 10FT (ELECTRODE) IMPLANT
SET ARTHROSCOPY TUBING (MISCELLANEOUS) ×2
SET ARTHROSCOPY TUBING LN (MISCELLANEOUS) ×1 IMPLANT
SPONGE GAUZE 4X4 12PLY (GAUZE/BANDAGES/DRESSINGS) ×2 IMPLANT
SUT ETHILON 4 0 PS 2 18 (SUTURE) ×2 IMPLANT
TOWEL OR 17X24 6PK STRL BLUE (TOWEL DISPOSABLE) ×2 IMPLANT
WAND 30 DEG SABER W/CORD (SURGICAL WAND) IMPLANT
WAND 90 DEG TURBOVAC W/CORD (SURGICAL WAND) ×1 IMPLANT
WATER STERILE IRR 500ML POUR (IV SOLUTION) ×2 IMPLANT

## 2011-04-17 NOTE — Anesthesia Preprocedure Evaluation (Signed)
Anesthesia Evaluation  Patient identified by MRN, date of birth, ID band Patient awake    Reviewed: Allergy & Precautions, H&P , NPO status , Patient's Chart, lab work & pertinent test results  Airway Mallampati: II TM Distance: >3 FB Neck ROM: Full    Dental No notable dental hx.    Pulmonary neg pulmonary ROS,  clear to auscultation  Pulmonary exam normal       Cardiovascular + CAD and neg cardio ROS Regular Normal lbbb    Neuro/Psych Negative Neurological ROS  Negative Psych ROS   GI/Hepatic negative GI ROS, Neg liver ROS, GERD-  Medicated,  Endo/Other  Negative Endocrine ROSHypothyroidism   Renal/GU negative Renal ROS  Genitourinary negative   Musculoskeletal negative musculoskeletal ROS (+) Arthritis -, Rheumatoid disorders,    Abdominal   Peds negative pediatric ROS (+)  Hematology negative hematology ROS (+)   Anesthesia Other Findings   Reproductive/Obstetrics negative OB ROS                           Anesthesia Physical Anesthesia Plan  ASA: II  Anesthesia Plan: General   Post-op Pain Management:    Induction: Intravenous  Airway Management Planned:   Additional Equipment:   Intra-op Plan:   Post-operative Plan: Extubation in OR  Informed Consent: I have reviewed the patients History and Physical, chart, labs and discussed the procedure including the risks, benefits and alternatives for the proposed anesthesia with the patient or authorized representative who has indicated his/her understanding and acceptance.   Dental advisory given  Plan Discussed with: CRNA  Anesthesia Plan Comments:         Anesthesia Quick Evaluation

## 2011-04-17 NOTE — Transfer of Care (Signed)
Immediate Anesthesia Transfer of Care Note  Patient: Debra Richards [facility]  Procedure(s) Performed:  ARTHROSCOPY KNEE - WITH LATERAL MENISCAL DEBRIDEMENT  Patient Location: PACU  Anesthesia Type: General  Level of Consciousness: sedated  Airway & Oxygen Therapy: Patient Spontanous Breathing and Patient connected to nasal cannula oxygen  Post-op Assessment: Report given to PACU RN  Post vital signs: Reviewed and stable  Complications: No apparent anesthesia complications

## 2011-04-17 NOTE — Interval H&P Note (Signed)
History and Physical Interval Note:   04/17/2011   1:47 PM   Kelli Churn Duff-Cook  has presented today for surgery, with the diagnosis of RIGHT KNEE LATERAL MENISCAL TEAR  The various methods of treatment have been discussed with the patient and family. After consideration of risks, benefits and other options for treatment, the patient has consented to  Procedure(s): ARTHROSCOPY KNEE as a surgical intervention .  The patients' history has been reviewed, patient examined, no change in status, stable for surgery.  I have reviewed the patients' chart and labs.  Questions were answered to the patient's satisfaction.     Loanne Drilling  MD

## 2011-04-17 NOTE — Anesthesia Procedure Notes (Signed)
Procedure Name: LMA Insertion Performed by: Ludie Pavlik Pre-anesthesia Checklist: Patient identified, Emergency Drugs available, Suction available and Patient being monitored Patient Re-evaluated:Patient Re-evaluated prior to inductionOxygen Delivery Method: Circle System Utilized Preoxygenation: Pre-oxygenation with 100% oxygen Intubation Type: IV induction Ventilation: Mask ventilation without difficulty LMA: LMA inserted LMA Size: 4.0 Number of attempts: 1 Placement Confirmation: positive ETCO2 Dental Injury: Teeth and Oropharynx as per pre-operative assessment      

## 2011-04-17 NOTE — Op Note (Signed)
Preoperative diagnosis-  Right knee lateral meniscal tear  Postoperative diagnosis Right- knee lateral meniscal tear   Procedure- Right knee arthroscopy with lateral  Meniscal debridement    Surgeon- Gus Rankin. Elric Tirado, MD  Anesthesia-General  EBL-  minimal Complications- None  Condition- PACU - hemodynamically stable.  Brief clinical note- -Debra Richards is a 77 y.o.  female with a several month history of right knee pain and mechanical symptoms. Exam and history suggested lateral meniscal tear confirmed by MRI. She presents now for arthroscopy and debridement  Procedure in detail -       After successful administration of General anesthetic, a tourmiquet is placed high on the Right  thigh and the Right lower extremity is prepped and draped in the usual sterile fashion. Time out is performed by the surgical team. Standard superomedial and inferolateral portal sites are marked and incisions made with an 11 blade. The inflow cannula is passed through the superomedial portal and camera through the inferolateral portal and inflow is initiated. Arthroscopic visualization proceeds.      The undersurface of the patella and trochlea are visualized and there is grade II chondromalacia of both with no unstable cartilage. The medial and lateral gutters are visualized and there are  no loose bodies. Flexion and valgus force is applied to the knee and the medial compartment is entered. A spinal needle is passed into the joint through the site marked for the inferomedial portal. A small incision is made and the dilator passed into the joint. The findings for the medial compartment are no evidence of tears or chondral defects .     The intercondylar notch is visualized and the ACL appears normal. The lateral compartment is entered and the findings are unstable tear of the body and posterior horn of the lateral meniscus . The tear is debrided to a stable base with baskets and a shaver and sealed off  with the Arthrocare. It is probed and found to be stable.      The joint is again inspected and there are no other tears, defects or loose bodies identified. The arthroscopic equipment is then removed from the inferior portals which are closed with interrupted 4-0 nylon. 20 ml of .25% Marcaine with epinephrine are injected through the inflow cannula and the cannula is then removed and the portal closed with nylon. The incisions are cleaned and dried and a bulky sterile dressing is applied. The patient is then awakened and transported to recovery in stable condition.   04/17/2011, 2:35 PM

## 2011-04-29 ENCOUNTER — Encounter (HOSPITAL_BASED_OUTPATIENT_CLINIC_OR_DEPARTMENT_OTHER): Payer: Self-pay | Admitting: Orthopedic Surgery

## 2011-04-29 NOTE — Anesthesia Postprocedure Evaluation (Signed)
  Anesthesia Post-op Note  Patient: Debra Richards  Procedure(s) Performed:  ARTHROSCOPY KNEE - WITH LATERAL MENISCAL DEBRIDEMENT  Patient Location: PACU  Anesthesia Type: General  Level of Consciousness: awake and alert   Airway and Oxygen Therapy: Patient Spontanous Breathing  Post-op Pain: mild  Post-op Assessment: Post-op Vital signs reviewed, Patient's Cardiovascular Status Stable, Respiratory Function Stable, Patent Airway and No signs of Nausea or vomiting  Post-op Vital Signs: stable  Complications: No apparent anesthesia complications

## 2011-05-10 ENCOUNTER — Encounter (HOSPITAL_BASED_OUTPATIENT_CLINIC_OR_DEPARTMENT_OTHER): Payer: Self-pay

## 2011-08-08 ENCOUNTER — Other Ambulatory Visit (INDEPENDENT_AMBULATORY_CARE_PROVIDER_SITE_OTHER): Payer: Medicare Other

## 2011-08-08 ENCOUNTER — Ambulatory Visit (INDEPENDENT_AMBULATORY_CARE_PROVIDER_SITE_OTHER): Payer: Medicare Other | Admitting: Obstetrics and Gynecology

## 2011-08-08 ENCOUNTER — Other Ambulatory Visit: Payer: Self-pay | Admitting: Obstetrics and Gynecology

## 2011-08-08 DIAGNOSIS — N83209 Unspecified ovarian cyst, unspecified side: Secondary | ICD-10-CM

## 2011-08-08 DIAGNOSIS — N839 Noninflammatory disorder of ovary, fallopian tube and broad ligament, unspecified: Secondary | ICD-10-CM

## 2011-08-08 DIAGNOSIS — N838 Other noninflammatory disorders of ovary, fallopian tube and broad ligament: Secondary | ICD-10-CM

## 2011-08-08 DIAGNOSIS — N8300 Follicular cyst of ovary, unspecified side: Secondary | ICD-10-CM

## 2011-08-08 DIAGNOSIS — B373 Candidiasis of vulva and vagina: Secondary | ICD-10-CM

## 2011-08-08 MED ORDER — TERCONAZOLE 0.8 % VA CREA: 1.0000 | TOPICAL_CREAM | Freq: Every day | VAGINAL | Status: AC

## 2011-08-08 NOTE — Progress Notes (Signed)
Patient came to see me today for followup ultrasound. We have been watching a complex cyst in her right ovary which was discovered in 2010 when she was ultrasounded for pelvic pain. In October of 2011 we had her see Dr. Emmaline Kluver for a second opinion who recommended observation rather than surgery if it remains stable. Today she came back for followup ultrasound. On ultrasound her uterus is surgically absent. Her right ovary continues to show a small solid tumor which is 1.2 x 0.9 x 1.3 cm which is stable. It still contains calcification. It is negative for PFD. Her left ovary shows a thin-walled cyst which is new and which is 1.2 cm with internal echoes. It is also negative for PFD. The cul-de-sac is free of fluid. The patient also feels that she has a slight yeast infection.  Assessment: #1. Persistent right ovarian cyst-stable #2 new left ovarian cyst #3 yeast vaginitis  Plan: Reassured patient that we should continue to watch these lesions. Followup ultrasound in 6 months. Prescription for terconazole 3 cream written.

## 2011-12-26 DIAGNOSIS — M810 Age-related osteoporosis without current pathological fracture: Secondary | ICD-10-CM

## 2011-12-26 HISTORY — DX: Age-related osteoporosis without current pathological fracture: M81.0

## 2011-12-27 ENCOUNTER — Encounter: Payer: Self-pay | Admitting: Obstetrics and Gynecology

## 2011-12-27 ENCOUNTER — Ambulatory Visit (INDEPENDENT_AMBULATORY_CARE_PROVIDER_SITE_OTHER): Payer: Medicare Other | Admitting: Obstetrics and Gynecology

## 2011-12-27 VITALS — BP 112/68 | Ht 64.0 in | Wt 135.0 lb

## 2011-12-27 DIAGNOSIS — I499 Cardiac arrhythmia, unspecified: Secondary | ICD-10-CM | POA: Insufficient documentation

## 2011-12-27 DIAGNOSIS — K219 Gastro-esophageal reflux disease without esophagitis: Secondary | ICD-10-CM | POA: Insufficient documentation

## 2011-12-27 DIAGNOSIS — N949 Unspecified condition associated with female genital organs and menstrual cycle: Secondary | ICD-10-CM

## 2011-12-27 DIAGNOSIS — N201 Calculus of ureter: Secondary | ICD-10-CM | POA: Insufficient documentation

## 2011-12-27 DIAGNOSIS — N83209 Unspecified ovarian cyst, unspecified side: Secondary | ICD-10-CM

## 2011-12-27 DIAGNOSIS — R102 Pelvic and perineal pain: Secondary | ICD-10-CM

## 2011-12-27 DIAGNOSIS — S83289A Other tear of lateral meniscus, current injury, unspecified knee, initial encounter: Secondary | ICD-10-CM | POA: Insufficient documentation

## 2011-12-27 DIAGNOSIS — R609 Edema, unspecified: Secondary | ICD-10-CM | POA: Insufficient documentation

## 2011-12-27 DIAGNOSIS — Z87442 Personal history of urinary calculi: Secondary | ICD-10-CM | POA: Insufficient documentation

## 2011-12-27 DIAGNOSIS — B373 Candidiasis of vulva and vagina: Secondary | ICD-10-CM

## 2011-12-27 DIAGNOSIS — D219 Benign neoplasm of connective and other soft tissue, unspecified: Secondary | ICD-10-CM | POA: Insufficient documentation

## 2011-12-27 DIAGNOSIS — N83201 Unspecified ovarian cyst, right side: Secondary | ICD-10-CM | POA: Insufficient documentation

## 2011-12-27 DIAGNOSIS — R350 Frequency of micturition: Secondary | ICD-10-CM

## 2011-12-27 DIAGNOSIS — L719 Rosacea, unspecified: Secondary | ICD-10-CM | POA: Insufficient documentation

## 2011-12-27 DIAGNOSIS — E039 Hypothyroidism, unspecified: Secondary | ICD-10-CM | POA: Insufficient documentation

## 2011-12-27 DIAGNOSIS — M069 Rheumatoid arthritis, unspecified: Secondary | ICD-10-CM | POA: Insufficient documentation

## 2011-12-27 DIAGNOSIS — I447 Left bundle-branch block, unspecified: Secondary | ICD-10-CM | POA: Insufficient documentation

## 2011-12-27 DIAGNOSIS — I341 Nonrheumatic mitral (valve) prolapse: Secondary | ICD-10-CM | POA: Insufficient documentation

## 2011-12-27 DIAGNOSIS — I2510 Atherosclerotic heart disease of native coronary artery without angina pectoris: Secondary | ICD-10-CM | POA: Insufficient documentation

## 2011-12-27 MED ORDER — FLUCONAZOLE 200 MG PO TABS: 200.0000 mg | ORAL_TABLET | Freq: Every day | ORAL | Status: AC

## 2011-12-27 NOTE — Patient Instructions (Signed)
Schedule pelvic ultrasound here for September this year. Bone density either at orthopedist or here.

## 2011-12-27 NOTE — Progress Notes (Signed)
Patient came back to see me today for further followup. We have been watching her with a complex cyst of her right ovary first found in 2010 when she was having pelvic pain. It has remained stable since then. We got a second opinion from Dr. Emmaline Kluver who recommended observation. Her last ultrasound was March, 2013 and it was stable but she had a new 1 cm cyst on her left ovary which appeared nonsuspicious. She is due for a followup ultrasound in September of this year. She continues to have some residual right lower quadrant pain that she feels is tolerable. She is having many orthopedic problems. She has had knee surgery in the past but Dr. Despina Hick now feels she needs knee replacement. He also told her that she has a fracture of her femur that did not occur with a fall. No one  has ordered a bone density. She had a bone density many years ago and does not know what it showed. She has had a hysterectomy and has never had abnormal Pap smears. Her last Pap smear was July, 2011 and was normal. She has had trouble with recurrent yeast infections. She was treated by the urologist with a cream which she said was pasty and this did not help. She continues to have vaginal itching and did not respond to the terconazole 3 cream I gave her in March.  ROS: 12 system review done. Pertinent positives above. Other positives include GERD, constipation, coronary artery disease, rosacea, renal stone and hypothyroidism  HEENT: Within normal limits. Neck: No masses. Supraclavicular lymph nodes: Not enlarged. Breasts: Examined in both sitting and lying position. Symmetrical without skin changes or masses. Abdomen: Soft no masses guarding or rebound. No hernias. Pelvic: External within normal limits. BUS within normal limits. Vaginal examination shows good estrogen effect, no cystocele enterocele or rectocele. Cervix and uterus absent. Adnexa within normal limits. Rectovaginal confirmatory. Extremities within  normal limits.  Assessment: Bilateral ovarian cysts. Possible osteoporosis. Yeast vaginitis. Chronic pelvic pain.  Plan: Followup ultrasound September, 2013 here. I told patient she needs bone density. She will talk to orthopedist in either have him do it prior to her surgery or come back here for it. Treat her with Diflucan 200 mg daily for 7 days. We may want to consider estrogen cream if itching persists. Mammogram-she will schedule it [place].

## 2011-12-28 LAB — URINALYSIS W MICROSCOPIC + REFLEX CULTURE
Bilirubin Urine: NEGATIVE
Casts: NONE SEEN
Glucose, UA: NEGATIVE mg/dL
Hgb urine dipstick: NEGATIVE
Ketones, ur: NEGATIVE mg/dL
Leukocytes, UA: NEGATIVE
Nitrite: NEGATIVE
Protein, ur: NEGATIVE mg/dL
Specific Gravity, Urine: 1.019 (ref 1.005–1.030)
Squamous Epithelial / HPF: NONE SEEN
Urobilinogen, UA: 1 mg/dL (ref 0.0–1.0)
pH: 5.5 (ref 5.0–8.0)

## 2011-12-30 ENCOUNTER — Other Ambulatory Visit: Payer: Self-pay | Admitting: *Deleted

## 2011-12-30 DIAGNOSIS — Z780 Asymptomatic menopausal state: Secondary | ICD-10-CM

## 2012-01-02 ENCOUNTER — Ambulatory Visit (INDEPENDENT_AMBULATORY_CARE_PROVIDER_SITE_OTHER): Payer: Medicare Other

## 2012-01-02 DIAGNOSIS — M810 Age-related osteoporosis without current pathological fracture: Secondary | ICD-10-CM

## 2012-01-02 DIAGNOSIS — Z780 Asymptomatic menopausal state: Secondary | ICD-10-CM

## 2012-01-22 ENCOUNTER — Ambulatory Visit (INDEPENDENT_AMBULATORY_CARE_PROVIDER_SITE_OTHER): Payer: Medicare Other | Admitting: Obstetrics and Gynecology

## 2012-01-22 DIAGNOSIS — M810 Age-related osteoporosis without current pathological fracture: Secondary | ICD-10-CM

## 2012-01-22 NOTE — Patient Instructions (Signed)
Call Dr. Tana Felts office and make sure they received the bone density we sent and get his advice as to whether she should start treatment before his surgery in october.

## 2012-01-22 NOTE — Progress Notes (Signed)
Patient came to see me today at my request because her bone density showed osteoporosis. She actually had a previous one done in [place] in 2009 which showed osteoporosis. At that time her rheumatoid arthritis was being managed by Dr. Doyne Keel who ordered the bone density. He discussed with her going on medication but wanted her to stop her estrogen first. She is scheduled for surgery on her knee by Dr. Lequita Halt  in October. She also has a fracture of her femur that he is going to fix at the time of surgery. Her rheumatologist is currently Dr. Kellie Simmering. She has terrible trouble swallowing pills and suffers from both GERD and throws up medication easily. She is taking calcium and vitamin D. She is a nonsmoker nondrinker. She tries to exercise as much as possible with her orthopedic problems.  We discussed her bone density in detail. I have made her aware of her great risk for future fractures and that they could be life-threatening especially if it was a hip fracture. We are going to send a copy of her bone density both to Dr. Kellie Simmering and Dr. Bernadene Bell. She will call Dr. Despina Hick and see if he wants her started on medication prior to her surgery. We discussed Prolia is the perfect choice for her with her problem with oral medication. I explained to her that both because I am retiring and a complicated issue she has with her rheumatoid arthritis that I thought Dr. Kellie Simmering should be the one  treating her. She will see him after her surgery. We will be happy to start her medication now if that's what her orthopedic surgeon wants. Lab work was done for secondary causes of  bone loss.

## 2012-01-23 LAB — VITAMIN D 25 HYDROXY (VIT D DEFICIENCY, FRACTURES): Vit D, 25-Hydroxy: 60 ng/mL (ref 30–89)

## 2012-02-13 NOTE — Addendum Note (Signed)
Addendum  created 02/13/12 0719 by Fran Lowes, CRNA   Modules edited:Notes Section

## 2012-02-26 ENCOUNTER — Other Ambulatory Visit: Payer: Self-pay | Admitting: Orthopedic Surgery

## 2012-02-26 MED ORDER — BUPIVACAINE 0.25 % ON-Q PUMP SINGLE CATH 300ML: 300.0000 mL | INJECTION | Status: DC

## 2012-02-26 MED ORDER — DEXAMETHASONE SODIUM PHOSPHATE 10 MG/ML IJ SOLN: 10.0000 mg | Freq: Once | INTRAMUSCULAR | Status: DC

## 2012-02-26 NOTE — Progress Notes (Signed)
Preoperative surgical orders have been place into the Epic hospital system for Debra Richards on 02/26/2012, 5:05 PM  by Patrica Duel for surgery on 03/23/2012.  Preop Total Knee orders including Bupivacaine On-Q pump, IV Tylenol, and IV Decadron as long as there are no contraindications to the above medications. Avel Peace, PA-C

## 2012-03-13 ENCOUNTER — Encounter (HOSPITAL_COMMUNITY): Payer: Self-pay | Admitting: Pharmacy Technician

## 2012-03-17 ENCOUNTER — Encounter (HOSPITAL_COMMUNITY): Payer: Self-pay

## 2012-03-18 ENCOUNTER — Encounter (HOSPITAL_COMMUNITY): Payer: Self-pay

## 2012-03-18 ENCOUNTER — Encounter (HOSPITAL_COMMUNITY)
Admission: RE | Admit: 2012-03-18 | Discharge: 2012-03-18 | Disposition: A | Discharge status: Home / Self Care | Payer: Medicare Other | Source: Ambulatory Visit | Attending: Orthopedic Surgery | Admitting: Orthopedic Surgery

## 2012-03-18 ENCOUNTER — Ambulatory Visit (HOSPITAL_COMMUNITY)
Admission: RE | Admit: 2012-03-18 | Discharge: 2012-03-18 | Disposition: A | Discharge status: Home / Self Care | Payer: Medicare Other | Source: Ambulatory Visit | Attending: Orthopedic Surgery | Admitting: Orthopedic Surgery

## 2012-03-18 DIAGNOSIS — M1710 Unilateral primary osteoarthritis, unspecified knee: Secondary | ICD-10-CM | POA: Insufficient documentation

## 2012-03-18 DIAGNOSIS — Z01818 Encounter for other preprocedural examination: Secondary | ICD-10-CM | POA: Insufficient documentation

## 2012-03-18 DIAGNOSIS — IMO0002 Reserved for concepts with insufficient information to code with codable children: Secondary | ICD-10-CM | POA: Insufficient documentation

## 2012-03-18 DIAGNOSIS — Z01812 Encounter for preprocedural laboratory examination: Secondary | ICD-10-CM | POA: Insufficient documentation

## 2012-03-18 LAB — URINE MICROSCOPIC-ADD ON

## 2012-03-18 LAB — SURGICAL PCR SCREEN: MRSA, PCR: NEGATIVE

## 2012-03-18 LAB — COMPREHENSIVE METABOLIC PANEL
ALT: 12 U/L (ref 0–35)
AST: 23 U/L (ref 0–37)
Calcium: 9.5 mg/dL (ref 8.4–10.5)
GFR calc Af Amer: 87 mL/min — ABNORMAL LOW (ref >90)
Glucose, Bld: 89 mg/dL (ref 70–99)
Sodium: 140 mEq/L (ref 135–145)
Total Protein: 6.8 g/dL (ref 6.0–8.3)

## 2012-03-18 LAB — URINALYSIS, ROUTINE W REFLEX MICROSCOPIC
Glucose, UA: NEGATIVE mg/dL
Nitrite: NEGATIVE
Protein, ur: NEGATIVE mg/dL

## 2012-03-18 LAB — CBC
MCH: 32.1 pg (ref 26.0–34.0)
MCHC: 34.1 g/dL (ref 30.0–36.0)
Platelets: 158 10*3/uL (ref 150–400)

## 2012-03-18 NOTE — Patient Instructions (Addendum)
[address] Alfredo Collymore Quintela-Cook  03/18/2012   Your procedure is scheduled on:  03-23-12  Report to [id number]   AM.  Call this number if you have problems the morning of surgery: [phone]   Remember:   Do not eat food :After Midnight.  Clear liquids midnight until 0755am day of surgery, then nothing by mouth..  Take these medicines the morning of surgery with A SIP OF WATER: tenormin, lanoxin, protonix   Do not wear jewelry or make up.  Do not wear lotions, powders, or perfumes. You may wear deodorant.    Do not bring valuables to the hospital.  Contacts, dentures or bridgework may not be worn into surgery.  Leave suitcase in the car. After surgery it may be brought to your room.  For patients admitted to the hospital, checkout time is 11:00 AM the day of discharge                             Patients discharged the day of surgery will not be allowed to drive home. If going home same day of surgery, you must have someone stay with you the first 24 hours at home and arrange for some one to drive you home from hospital.    Special Instructions: See [facility] Preparing for Surgery instruction sheet. Women do not shave legs or underarms for 12 hours before showers. Men may shave face morning of surgery.    Please read over the following fact sheets that you were given: MRSA Information  Cain Sieve [facility] pre op nurse phone number [phone], call if needed

## 2012-03-18 NOTE — Progress Notes (Signed)
Micro ua results faxed to dr alsuisio by epic

## 2012-03-18 NOTE — Pre-Procedure Instructions (Signed)
Pt wants to know if right remur needs to be added to or consent, if so please place new order in epic for pt to sign day of surgery. thanks

## 2012-03-18 NOTE — Progress Notes (Addendum)
ekg 02-18-2012 dr Alanda Amass on chart lov note 02-18-2012 dr Alanda Amass on chart 10-2--2013 stress test se heart and vascular on chart Dr Sherril Croon medical clearance note on chart Dr Alanda Amass cardiac clearance note on chart

## 2012-03-22 ENCOUNTER — Other Ambulatory Visit: Payer: Self-pay | Admitting: Orthopedic Surgery

## 2012-03-22 NOTE — H&P (Signed)
Debra Richards  DOB: 02/09/1926 Married / Language: English / Race: White Female  Date of Admission:  03/23/2012  Chief Complaint:  Right Knee Pain  History of Present Illness The patient is a 78 year old female who comes in for a preoperative History and Physical. The patient is scheduled for a right total knee arthroplasty to be performed by Dr. Frank V. Aluisio, MD at [facility] on 03/23/2012. The patient is being followed for their right knee pain and osteoarthritis. Symptoms reported today include: pain. Current treatment includes: physical therapy (patient states that it has helped.). The patient has been on the walker following her previous knee scope. She is still using the cane at times. She states that the physical therapy really has helped her a lot. She has regained more strength. She is still having discomfort. It is felt that she could benefit from undergoing the total knee replacement. She is now ready to proceed with surgery. They have been treated conservatively in the past for the above stated problem and despite conservative measures, they continue to have progressive pain and severe functional limitations and dysfunction. They have failed non-operative management including home exercise, medications. It is felt that they would benefit from undergoing total joint replacement. Risks and benefits of the procedure have been discussed with the patient and they elect to proceed with surgery. There are no active contraindications to surgery such as ongoing infection or rapidly progressive neurological disease.   Problem List Osteoarthritis, knee (715.96)   Allergies Penicillins   Family History Cerebrovascular Accident. father Cancer. mother and grandmother mothers side Heart Disease. father Congestive Heart Failure. father Liver Disease, Chronic. brother   Social History Living situation. live with spouse Illicit drug use.  no Number of flights of stairs before winded. 2-3 Marital status. married Exercise. Exercises rarely; does running / walking Current work status. retired Children. 2 Drug/Alcohol Rehab (Previously). no Drug/Alcohol Rehab (Currently). no Pain Contract. yes Tobacco use. Never smoker. never smoker Tobacco / smoke exposure. yes outdoors only Alcohol use. never consumed alcohol Post-Surgical Plans. Plan is to go home with famiy. Advance Directives. Healthcare POA   Medication History Lanoxin (0.125MG Tablet, Oral) Active. Tenormin ( Oral) Specific dose unknown - Active. Protonix (40MG Tablet DR, Oral) Active. Synthroid ( Oral) Specific dose unknown - Active. Calcium 600 (1500MG Tablet, Oral) Active. Centrum ( Oral) Active. Tranxene-T (7.5MG Tablet, Oral) Active. Aspirin EC (81MG Tablet DR, Oral) Active.   Past Surgical History Hysterectomy. partial (non-cancerous) Arthroscopy of Knee. bilateral Lithotripsy Gallbladder Surgery. open Breast Biopsy. left   Medical History Kidney Stone Irritable bowel syndrome Rheumatoid Arthritis Gout Cardiac Arrhythmia . A. Fib Anxiety Disorder Gastroesophageal Reflux Disease Hiatal Hernia Measles Mumps Left Bundle Branch Block. Chronic Mitral Valve Prolapse Mitral Regurgitation Shingles. Twice Bronchitis Diverticulosis Urinary Tract Infection   Review of Systems General:Present- Fatigue. Not Present- Chills, Fever, Night Sweats, Weight Gain, Weight Loss and Memory Loss. Skin:Not Present- Hives, Itching, Rash, Eczema and Lesions. HEENT:Not Present- Tinnitus, Headache, Double Vision, Visual Loss, Hearing Loss and Dentures. Respiratory:Present- Shortness of breath with exertion. Not Present- Shortness of breath at rest, Allergies, Coughing up blood and Chronic Cough. Cardiovascular:Present- Palpitations. Not Present- Chest Pain, Racing/skipping heartbeats, Difficulty Breathing Lying Down, Murmur and  Swelling. Gastrointestinal:Present- Constipation and Diarrhea. Not Present- Bloody Stool, Heartburn, Abdominal Pain, Vomiting, Nausea, Difficulty Swallowing, Jaundice and Loss of appetitie. Female Genitourinary:Present- Urinary frequency. Not Present- Blood in Urine, Weak urinary stream, Discharge, Flank Pain, Incontinence, Painful Urination, Urgency, Urinary Retention and Urinating   at Night. Musculoskeletal:Present- Muscle Pain, Joint Swelling, Joint Pain and Morning Stiffness. Not Present- Muscle Weakness, Back Pain and Spasms. Neurological:Not Present- Tremor, Dizziness, Blackout spells, Paralysis, Difficulty with balance and Weakness. Psychiatric:Not Present- Insomnia.   Vitals Weight: 130 lb Height: 65 in Body Surface Area: 1.64 m Body Mass Index: 21.63 kg/m Pulse: 56 (Regular) Resp.: 12 (Unlabored) BP: 122/82 (Sitting, Left Arm, Standard)    Physical Exam The physical exam findings are as follows:  Note: Patient is an 78 year old female with continued knee pain. Patient is accompanied today by her daughter.   General Mental Status - Alert, cooperative and good historian. General Appearance- pleasant. Not in acute distress. Orientation- Oriented X3. Build & Nutrition- Well nourished and Well developed.   Head and Neck Head- normocephalic, atraumatic . Neck Global Assessment- supple. no bruit auscultated on the right and no bruit auscultated on the left.   Eye Pupil- Bilateral- Regular and Round. Motion- Bilateral- EOMI.   Chest and Lung Exam Auscultation: Breath sounds:- clear at anterior chest wall and - clear at posterior chest wall. Adventitious sounds:- No Adventitious sounds.   Cardiovascular Auscultation:Rhythm- Regular rate and rhythm. Heart Sounds- S1 WNL and S2 WNL. Murmurs & Other Heart Sounds:Auscultation of the heart reveals - No Murmurs.   Abdomen Palpation/Percussion:Tenderness- Abdomen is non-tender  to palpation. Rigidity (guarding)- Abdomen is soft. Auscultation:Auscultation of the abdomen reveals - Bowel sounds normal.   Female Genitourinary Not done, not pertinent to present illness  Musculoskeletal She is in no distress. Her right knee shows trace effusion. She has a valgus deformity. Range 5 to 125 with no instability.  Assessment & Plan Osteoarthritis, knee (715.96)  Note: Patient is for a right total knee replacement by Dr. Aluisio.  Plan is to go home.  PCP - Dr.Vyas - Patient has been seen preoperatively and felt to be stable for surgery. Cardiology - Dr. Richard Weintruab   Signed electronically by DREW L PERKINS, PA-C  

## 2012-03-23 ENCOUNTER — Inpatient Hospital Stay (HOSPITAL_COMMUNITY)
Admission: RE | Admit: 2012-03-23 | Discharge: 2012-03-28 | DRG: 470 | Disposition: A | Discharge status: Home Health | Payer: Medicare Other | Source: Ambulatory Visit | Attending: Orthopedic Surgery | Admitting: Orthopedic Surgery

## 2012-03-23 ENCOUNTER — Encounter (HOSPITAL_COMMUNITY): Payer: Self-pay | Admitting: *Deleted

## 2012-03-23 ENCOUNTER — Inpatient Hospital Stay (HOSPITAL_COMMUNITY): Payer: Medicare Other | Admitting: *Deleted

## 2012-03-23 ENCOUNTER — Encounter (HOSPITAL_COMMUNITY)
Admission: RE | Disposition: A | Discharge status: Home Health | Payer: Self-pay | Source: Ambulatory Visit | Attending: Orthopedic Surgery

## 2012-03-23 DIAGNOSIS — D72829 Elevated white blood cell count, unspecified: Secondary | ICD-10-CM | POA: Diagnosis not present

## 2012-03-23 DIAGNOSIS — D62 Acute posthemorrhagic anemia: Secondary | ICD-10-CM | POA: Diagnosis not present

## 2012-03-23 DIAGNOSIS — K449 Diaphragmatic hernia without obstruction or gangrene: Secondary | ICD-10-CM | POA: Diagnosis present

## 2012-03-23 DIAGNOSIS — I4891 Unspecified atrial fibrillation: Secondary | ICD-10-CM | POA: Diagnosis present

## 2012-03-23 DIAGNOSIS — R410 Disorientation, unspecified: Secondary | ICD-10-CM

## 2012-03-23 DIAGNOSIS — E039 Hypothyroidism, unspecified: Secondary | ICD-10-CM | POA: Diagnosis present

## 2012-03-23 DIAGNOSIS — R404 Transient alteration of awareness: Secondary | ICD-10-CM

## 2012-03-23 DIAGNOSIS — E871 Hypo-osmolality and hyponatremia: Secondary | ICD-10-CM | POA: Diagnosis not present

## 2012-03-23 DIAGNOSIS — F1920 Other psychoactive substance dependence, uncomplicated: Secondary | ICD-10-CM | POA: Diagnosis present

## 2012-03-23 DIAGNOSIS — M1710 Unilateral primary osteoarthritis, unspecified knee: Principal | ICD-10-CM | POA: Diagnosis present

## 2012-03-23 DIAGNOSIS — F19939 Other psychoactive substance use, unspecified with withdrawal, unspecified: Secondary | ICD-10-CM | POA: Diagnosis not present

## 2012-03-23 DIAGNOSIS — E876 Hypokalemia: Secondary | ICD-10-CM

## 2012-03-23 DIAGNOSIS — Z96659 Presence of unspecified artificial knee joint: Secondary | ICD-10-CM

## 2012-03-23 HISTORY — PX: TOTAL KNEE ARTHROPLASTY: SHX125

## 2012-03-23 LAB — TYPE AND SCREEN

## 2012-03-23 LAB — ABO/RH: ABO/RH(D): A NEG

## 2012-03-23 SURGERY — ARTHROPLASTY, KNEE, TOTAL
Anesthesia: Spinal | Site: Knee | Laterality: Right | Wound class: Clean

## 2012-03-23 MED ORDER — LIDOCAINE HCL (CARDIAC) 20 MG/ML IV SOLN
INTRAVENOUS | Status: DC | PRN
  Administered 2012-03-23: 30 mg via INTRAVENOUS

## 2012-03-23 MED ORDER — DEXAMETHASONE 6 MG PO TABS
10.0000 mg | ORAL_TABLET | Freq: Three times a day (TID) | ORAL | Status: AC
  Administered 2012-03-23 – 2012-03-24 (×2): 10 mg via ORAL
  Filled 2012-03-23 (×3): qty 1

## 2012-03-23 MED ORDER — POLYETHYLENE GLYCOL 3350 17 G PO PACK: 17.0000 g | PACK | Freq: Every day | ORAL | Status: DC | PRN

## 2012-03-23 MED ORDER — SODIUM CHLORIDE 0.9 % IV SOLN
10.0000 mg | INTRAVENOUS | Status: DC | PRN
  Administered 2012-03-23: 20 ug/min via INTRAVENOUS

## 2012-03-23 MED ORDER — LEVOTHYROXINE SODIUM 75 MCG PO TABS
75.0000 ug | ORAL_TABLET | Freq: Every evening | ORAL | Status: DC
  Administered 2012-03-23 – 2012-03-27 (×5): 75 ug via ORAL
  Filled 2012-03-23 (×6): qty 1

## 2012-03-23 MED ORDER — FLEET ENEMA 7-19 GM/118ML RE ENEM: 1.0000 | ENEMA | Freq: Once | RECTAL | Status: AC | PRN

## 2012-03-23 MED ORDER — ACETAMINOPHEN 10 MG/ML IV SOLN
1000.0000 mg | Freq: Four times a day (QID) | INTRAVENOUS | Status: AC
  Administered 2012-03-23 – 2012-03-24 (×4): 1000 mg via INTRAVENOUS
  Filled 2012-03-23 (×7): qty 100

## 2012-03-23 MED ORDER — GLYCOPYRROLATE 0.2 MG/ML IJ SOLN
INTRAMUSCULAR | Status: DC | PRN
  Administered 2012-03-23: 0.2 mg via INTRAVENOUS

## 2012-03-23 MED ORDER — BUPIVACAINE ON-Q PAIN PUMP (FOR ORDER SET NO CHG)
INJECTION | Status: DC
  Filled 2012-03-23: qty 1

## 2012-03-23 MED ORDER — VANCOMYCIN HCL IN DEXTROSE 1-5 GM/200ML-% IV SOLN
1000.0000 mg | Freq: Two times a day (BID) | INTRAVENOUS | Status: AC
  Administered 2012-03-24: 1000 mg via INTRAVENOUS
  Filled 2012-03-23: qty 200

## 2012-03-23 MED ORDER — RIVAROXABAN 10 MG PO TABS
10.0000 mg | ORAL_TABLET | Freq: Every day | ORAL | Status: DC
  Administered 2012-03-24 – 2012-03-28 (×5): 10 mg via ORAL
  Filled 2012-03-23 (×7): qty 1

## 2012-03-23 MED ORDER — METOCLOPRAMIDE HCL 5 MG/ML IJ SOLN
5.0000 mg | Freq: Three times a day (TID) | INTRAMUSCULAR | Status: DC | PRN
  Administered 2012-03-23: 10 mg via INTRAVENOUS
  Filled 2012-03-23: qty 2

## 2012-03-23 MED ORDER — PHENOL 1.4 % MT LIQD
1.0000 | OROMUCOSAL | Status: DC | PRN
  Filled 2012-03-23: qty 177

## 2012-03-23 MED ORDER — ONDANSETRON HCL 4 MG/2ML IJ SOLN: 4.0000 mg | Freq: Four times a day (QID) | INTRAMUSCULAR | Status: DC | PRN

## 2012-03-23 MED ORDER — 0.9 % SODIUM CHLORIDE (POUR BTL) OPTIME
TOPICAL | Status: DC | PRN
  Administered 2012-03-23: 1000 mL

## 2012-03-23 MED ORDER — LACTATED RINGERS IV SOLN
INTRAVENOUS | Status: DC
  Administered 2012-03-23: 16:00:00 via INTRAVENOUS

## 2012-03-23 MED ORDER — DIPHENHYDRAMINE HCL 12.5 MG/5ML PO ELIX: 12.5000 mg | ORAL_SOLUTION | ORAL | Status: DC | PRN

## 2012-03-23 MED ORDER — SODIUM CHLORIDE 0.9 % IV SOLN: INTRAVENOUS | Status: DC

## 2012-03-23 MED ORDER — PROPOFOL 10 MG/ML IV EMUL
INTRAVENOUS | Status: DC | PRN
  Administered 2012-03-23: 75 ug/kg/min via INTRAVENOUS

## 2012-03-23 MED ORDER — BUPIVACAINE 0.25 % ON-Q PUMP SINGLE CATH 100 ML
INJECTION | Status: DC | PRN
  Administered 2012-03-23: 300 mL

## 2012-03-23 MED ORDER — PANTOPRAZOLE SODIUM 20 MG PO TBEC
20.0000 mg | DELAYED_RELEASE_TABLET | Freq: Every morning | ORAL | Status: DC
  Administered 2012-03-24 – 2012-03-28 (×5): 20 mg via ORAL
  Filled 2012-03-23 (×5): qty 1

## 2012-03-23 MED ORDER — METOCLOPRAMIDE HCL 10 MG PO TABS: 5.0000 mg | ORAL_TABLET | Freq: Three times a day (TID) | ORAL | Status: DC | PRN

## 2012-03-23 MED ORDER — LACTATED RINGERS IV SOLN
INTRAVENOUS | Status: DC | PRN
  Administered 2012-03-23 (×2): via INTRAVENOUS

## 2012-03-23 MED ORDER — DOCUSATE SODIUM 100 MG PO CAPS
100.0000 mg | ORAL_CAPSULE | Freq: Two times a day (BID) | ORAL | Status: DC
  Administered 2012-03-23 – 2012-03-26 (×6): 100 mg via ORAL

## 2012-03-23 MED ORDER — ACETAMINOPHEN 10 MG/ML IV SOLN
1000.0000 mg | Freq: Once | INTRAVENOUS | Status: AC
  Administered 2012-03-23: 1000 mg via INTRAVENOUS

## 2012-03-23 MED ORDER — MIDAZOLAM HCL 5 MG/5ML IJ SOLN
INTRAMUSCULAR | Status: DC | PRN
  Administered 2012-03-23 (×2): 0.5 mg via INTRAVENOUS
  Administered 2012-03-23: 1 mg via INTRAVENOUS

## 2012-03-23 MED ORDER — ATENOLOL 12.5 MG HALF TABLET
12.5000 mg | ORAL_TABLET | Freq: Every morning | ORAL | Status: DC
Start: 2012-03-24 — End: 2012-03-24
  Filled 2012-03-23: qty 1

## 2012-03-23 MED ORDER — SODIUM CHLORIDE 0.9 % IR SOLN
Status: DC | PRN
  Administered 2012-03-23: 1000 mL

## 2012-03-23 MED ORDER — DEXAMETHASONE SODIUM PHOSPHATE 10 MG/ML IJ SOLN
10.0000 mg | Freq: Three times a day (TID) | INTRAMUSCULAR | Status: AC
  Administered 2012-03-24: 10 mg via INTRAVENOUS
  Filled 2012-03-23 (×3): qty 1

## 2012-03-23 MED ORDER — ACETAMINOPHEN 325 MG PO TABS
650.0000 mg | ORAL_TABLET | Freq: Four times a day (QID) | ORAL | Status: DC | PRN
  Administered 2012-03-25 – 2012-03-26 (×3): 650 mg via ORAL
  Filled 2012-03-23 (×3): qty 2

## 2012-03-23 MED ORDER — ONDANSETRON HCL 4 MG/2ML IJ SOLN
INTRAMUSCULAR | Status: DC | PRN
  Administered 2012-03-23: 4 mg via INTRAVENOUS

## 2012-03-23 MED ORDER — DIGOXIN 0.0625 MG HALF TABLET
62.5000 ug | ORAL_TABLET | Freq: Every day | ORAL | Status: DC
  Administered 2012-03-24 – 2012-03-28 (×5): 62.5 ug via ORAL
  Filled 2012-03-23 (×5): qty 1

## 2012-03-23 MED ORDER — MENTHOL 3 MG MT LOZG
1.0000 | LOZENGE | OROMUCOSAL | Status: DC | PRN
  Filled 2012-03-23: qty 9

## 2012-03-23 MED ORDER — PHENYLEPHRINE HCL 10 MG/ML IJ SOLN
INTRAMUSCULAR | Status: DC | PRN
  Administered 2012-03-23 (×2): 40 ug via INTRAVENOUS

## 2012-03-23 MED ORDER — OXYCODONE HCL 5 MG PO TABS: 5.0000 mg | ORAL_TABLET | ORAL | Status: DC | PRN

## 2012-03-23 MED ORDER — CLORAZEPATE DIPOTASSIUM 7.5 MG PO TABS
7.5000 mg | ORAL_TABLET | Freq: Two times a day (BID) | ORAL | Status: DC
  Administered 2012-03-24 – 2012-03-27 (×5): 7.5 mg via ORAL
  Filled 2012-03-23 (×6): qty 1

## 2012-03-23 MED ORDER — EPHEDRINE SULFATE 50 MG/ML IJ SOLN
INTRAMUSCULAR | Status: DC | PRN
  Administered 2012-03-23 (×2): 5 mg via INTRAVENOUS

## 2012-03-23 MED ORDER — CHLORHEXIDINE GLUCONATE 4 % EX LIQD
60.0000 mL | Freq: Once | CUTANEOUS | Status: DC
  Filled 2012-03-23: qty 60

## 2012-03-23 MED ORDER — DEXTROSE-NACL 5-0.9 % IV SOLN
INTRAVENOUS | Status: DC
  Administered 2012-03-24 – 2012-03-25 (×2): via INTRAVENOUS

## 2012-03-23 MED ORDER — METHOCARBAMOL 100 MG/ML IJ SOLN
500.0000 mg | Freq: Four times a day (QID) | INTRAVENOUS | Status: DC | PRN
  Administered 2012-03-23: 500 mg via INTRAVENOUS
  Filled 2012-03-23 (×2): qty 5

## 2012-03-23 MED ORDER — METHOCARBAMOL 500 MG PO TABS
500.0000 mg | ORAL_TABLET | Freq: Four times a day (QID) | ORAL | Status: DC | PRN
  Administered 2012-03-24 (×2): 500 mg via ORAL
  Filled 2012-03-23 (×2): qty 1

## 2012-03-23 MED ORDER — BISACODYL 10 MG RE SUPP: 10.0000 mg | Freq: Every day | RECTAL | Status: DC | PRN

## 2012-03-23 MED ORDER — HYDROMORPHONE HCL PF 1 MG/ML IJ SOLN
0.2500 mg | INTRAMUSCULAR | Status: DC | PRN
  Administered 2012-03-23 (×2): 0.25 mg via INTRAVENOUS
  Administered 2012-03-23: 0.5 mg via INTRAVENOUS

## 2012-03-23 MED ORDER — MORPHINE SULFATE 2 MG/ML IJ SOLN
1.0000 mg | INTRAMUSCULAR | Status: DC | PRN
  Administered 2012-03-23: 1 mg via INTRAVENOUS
  Filled 2012-03-23 (×2): qty 1

## 2012-03-23 MED ORDER — VANCOMYCIN HCL 1000 MG IV SOLR
1500.0000 mg | INTRAVENOUS | Status: AC
  Administered 2012-03-23: 1500 mg via INTRAVENOUS
  Filled 2012-03-23: qty 1500

## 2012-03-23 MED ORDER — TRAMADOL HCL 50 MG PO TABS
50.0000 mg | ORAL_TABLET | Freq: Four times a day (QID) | ORAL | Status: DC | PRN
  Administered 2012-03-23: 50 mg via ORAL
  Administered 2012-03-24 (×2): 100 mg via ORAL
  Filled 2012-03-23 (×2): qty 2
  Filled 2012-03-23: qty 1

## 2012-03-23 MED ORDER — ACETAMINOPHEN 650 MG RE SUPP: 650.0000 mg | Freq: Four times a day (QID) | RECTAL | Status: DC | PRN

## 2012-03-23 MED ORDER — ONDANSETRON HCL 4 MG PO TABS: 4.0000 mg | ORAL_TABLET | Freq: Four times a day (QID) | ORAL | Status: DC | PRN

## 2012-03-23 SURGICAL SUPPLY — 55 items
BAG SPEC THK2 15X12 ZIP CLS (MISCELLANEOUS) ×1
BAG ZIPLOCK 12X15 (MISCELLANEOUS) ×2 IMPLANT
BANDAGE ELASTIC 6 VELCRO ST LF (GAUZE/BANDAGES/DRESSINGS) ×2 IMPLANT
BANDAGE ESMARK 6X9 LF (GAUZE/BANDAGES/DRESSINGS) ×1 IMPLANT
BLADE SAG 18X100X1.27 (BLADE) ×2 IMPLANT
BLADE SAW SGTL 11.0X1.19X90.0M (BLADE) ×2 IMPLANT
BNDG CMPR 9X6 STRL LF SNTH (GAUZE/BANDAGES/DRESSINGS) ×1
BNDG ESMARK 6X9 LF (GAUZE/BANDAGES/DRESSINGS) ×2
BOWL SMART MIX CTS (DISPOSABLE) ×2 IMPLANT
CATH KIT ON-Q SILVERSOAK 5IN (CATHETERS) ×2 IMPLANT
CEMENT HV SMART SET (Cement) ×4 IMPLANT
CLOTH BEACON ORANGE TIMEOUT ST (SAFETY) ×2 IMPLANT
CUFF TOURN SGL QUICK 34 (TOURNIQUET CUFF) ×2
CUFF TRNQT CYL 34X4X40X1 (TOURNIQUET CUFF) ×1 IMPLANT
DRAPE EXTREMITY T 121X128X90 (DRAPE) ×2 IMPLANT
DRAPE POUCH INSTRU U-SHP 10X18 (DRAPES) ×2 IMPLANT
DRAPE U-SHAPE 47X51 STRL (DRAPES) ×2 IMPLANT
DRSG ADAPTIC 3X8 NADH LF (GAUZE/BANDAGES/DRESSINGS) ×2 IMPLANT
DRSG PAD ABDOMINAL 8X10 ST (GAUZE/BANDAGES/DRESSINGS) ×2 IMPLANT
DURAPREP 26ML APPLICATOR (WOUND CARE) ×2 IMPLANT
ELECT REM PT RETURN 9FT ADLT (ELECTROSURGICAL) ×2
ELECTRODE REM PT RTRN 9FT ADLT (ELECTROSURGICAL) ×1 IMPLANT
EVACUATOR 1/8 PVC DRAIN (DRAIN) ×2 IMPLANT
FACESHIELD LNG OPTICON STERILE (SAFETY) ×10 IMPLANT
GLOVE BIO SURGEON STRL SZ8 (GLOVE) ×2 IMPLANT
GLOVE BIOGEL PI IND STRL 8 (GLOVE) ×2 IMPLANT
GLOVE BIOGEL PI INDICATOR 8 (GLOVE) ×2
GLOVE ECLIPSE 8.0 STRL XLNG CF (GLOVE) ×2 IMPLANT
GLOVE SURG SS PI 6.5 STRL IVOR (GLOVE) ×4 IMPLANT
GOWN STRL NON-REIN LRG LVL3 (GOWN DISPOSABLE) ×4 IMPLANT
GOWN STRL REIN XL XLG (GOWN DISPOSABLE) ×2 IMPLANT
HANDPIECE INTERPULSE COAX TIP (DISPOSABLE) ×2
IMMOBILIZER KNEE 20 (SOFTGOODS) ×2
IMMOBILIZER KNEE 20 THIGH 36 (SOFTGOODS) ×1 IMPLANT
KIT BASIN OR (CUSTOM PROCEDURE TRAY) ×2 IMPLANT
MANIFOLD NEPTUNE II (INSTRUMENTS) ×2 IMPLANT
NS IRRIG 1000ML POUR BTL (IV SOLUTION) ×2 IMPLANT
PACK TOTAL JOINT (CUSTOM PROCEDURE TRAY) ×2 IMPLANT
PAD ABD 7.5X8 STRL (GAUZE/BANDAGES/DRESSINGS) ×2 IMPLANT
PAD CAST 4YDX4 CTTN HI CHSV (CAST SUPPLIES) IMPLANT
PADDING CAST COTTON 4X4 STRL (CAST SUPPLIES) ×2
PADDING CAST COTTON 6X4 STRL (CAST SUPPLIES) ×4 IMPLANT
POSITIONER SURGICAL ARM (MISCELLANEOUS) ×2 IMPLANT
SET HNDPC FAN SPRY TIP SCT (DISPOSABLE) ×1 IMPLANT
SPONGE GAUZE 4X4 12PLY (GAUZE/BANDAGES/DRESSINGS) ×2 IMPLANT
STRIP CLOSURE SKIN 1/2X4 (GAUZE/BANDAGES/DRESSINGS) ×3 IMPLANT
SUCTION FRAZIER 12FR DISP (SUCTIONS) ×2 IMPLANT
SUT MNCRL AB 4-0 PS2 18 (SUTURE) ×2 IMPLANT
SUT VIC AB 2-0 CT1 27 (SUTURE) ×6
SUT VIC AB 2-0 CT1 TAPERPNT 27 (SUTURE) ×3 IMPLANT
SUT VLOC 180 0 24IN GS25 (SUTURE) ×2 IMPLANT
TOWEL OR 17X26 10 PK STRL BLUE (TOWEL DISPOSABLE) ×4 IMPLANT
TRAY FOLEY CATH 14FRSI W/METER (CATHETERS) ×2 IMPLANT
WATER STERILE IRR 1500ML POUR (IV SOLUTION) ×2 IMPLANT
WRAP KNEE MAXI GEL POST OP (GAUZE/BANDAGES/DRESSINGS) ×4 IMPLANT

## 2012-03-23 NOTE — H&P (View-Only) (Signed)
Debra Richards  DOB: 07-29-1925 Married / Language: English / Race: White Female  Date of Admission:  03/23/2012  Chief Complaint:  Right Knee Pain  History of Present Illness The patient is a 78 year old female who comes in for a preoperative History and Physical. The patient is scheduled for a right total knee arthroplasty to be performed by Dr. Gus Rankin. Aluisio, MD at [facility] on 03/23/2012. The patient is being followed for their right knee pain and osteoarthritis. Symptoms reported today include: pain. Current treatment includes: physical therapy (patient states that it has helped.). The patient has been on the walker following her previous knee scope. She is still using the cane at times. She states that the physical therapy really has helped her a lot. She has regained more strength. She is still having discomfort. It is felt that she could benefit from undergoing the total knee replacement. She is now ready to proceed with surgery. They have been treated conservatively in the past for the above stated problem and despite conservative measures, they continue to have progressive pain and severe functional limitations and dysfunction. They have failed non-operative management including home exercise, medications. It is felt that they would benefit from undergoing total joint replacement. Risks and benefits of the procedure have been discussed with the patient and they elect to proceed with surgery. There are no active contraindications to surgery such as ongoing infection or rapidly progressive neurological disease.   Problem List Osteoarthritis, knee (715.96)   Allergies Penicillins   Family History Cerebrovascular Accident. father Cancer. mother and grandmother mothers side Heart Disease. father Congestive Heart Failure. father Liver Disease, Chronic. brother   Social History Living situation. live with spouse Illicit drug use.  no Number of flights of stairs before winded. 2-3 Marital status. married Exercise. Exercises rarely; does running / walking Current work status. retired Copywriter, advertising. 2 Drug/Alcohol Rehab (Previously). no Drug/Alcohol Rehab (Currently). no Pain Contract. yes Tobacco use. Never smoker. never smoker Tobacco / smoke exposure. yes outdoors only Alcohol use. never consumed alcohol Post-Surgical Plans. Plan is to go home with famiy. Advance Directives. Healthcare POA   Medication History Lanoxin (0.125MG  Tablet, Oral) Active. Tenormin ( Oral) Specific dose unknown - Active. Protonix (40MG  Tablet DR, Oral) Active. Synthroid ( Oral) Specific dose unknown - Active. Calcium 600 (1500MG  Tablet, Oral) Active. Centrum ( Oral) Active. Tranxene-T (7.5MG  Tablet, Oral) Active. Aspirin EC (81MG  Tablet DR, Oral) Active.   Past Surgical History Hysterectomy. partial (non-cancerous) Arthroscopy of Knee. bilateral Lithotripsy Gallbladder Surgery. open Breast Biopsy. left   Medical History Kidney Stone Irritable bowel syndrome Rheumatoid Arthritis Gout Cardiac Arrhythmia . A. Fib Anxiety Disorder Gastroesophageal Reflux Disease Hiatal Hernia Measles Mumps Left Bundle Branch Block. Chronic Mitral Valve Prolapse Mitral Regurgitation Shingles. Twice Bronchitis Diverticulosis Urinary Tract Infection   Review of Systems General:Present- Fatigue. Not Present- Chills, Fever, Night Sweats, Weight Gain, Weight Loss and Memory Loss. Skin:Not Present- Hives, Itching, Rash, Eczema and Lesions. HEENT:Not Present- Tinnitus, Headache, Double Vision, Visual Loss, Hearing Loss and Dentures. Respiratory:Present- Shortness of breath with exertion. Not Present- Shortness of breath at rest, Allergies, Coughing up blood and Chronic Cough. Cardiovascular:Present- Palpitations. Not Present- Chest Pain, Racing/skipping heartbeats, Difficulty Breathing Lying Down, Murmur and  Swelling. Gastrointestinal:Present- Constipation and Diarrhea. Not Present- Bloody Stool, Heartburn, Abdominal Pain, Vomiting, Nausea, Difficulty Swallowing, Jaundice and Loss of appetitie. Female Genitourinary:Present- Urinary frequency. Not Present- Blood in Urine, Weak urinary stream, Discharge, Flank Pain, Incontinence, Painful Urination, Urgency, Urinary Retention and Urinating  at Night. Musculoskeletal:Present- Muscle Pain, Joint Swelling, Joint Pain and Morning Stiffness. Not Present- Muscle Weakness, Back Pain and Spasms. Neurological:Not Present- Tremor, Dizziness, Blackout spells, Paralysis, Difficulty with balance and Weakness. Psychiatric:Not Present- Insomnia.   Vitals Weight: 130 lb Height: 65 in Body Surface Area: 1.64 m Body Mass Index: 21.63 kg/m Pulse: 56 (Regular) Resp.: 12 (Unlabored) BP: 122/82 (Sitting, Left Arm, Standard)    Physical Exam The physical exam findings are as follows:  Note: Patient is an 78 year old female with continued knee pain. Patient is accompanied today by her daughter.   General Mental Status - Alert, cooperative and good historian. General Appearance- pleasant. Not in acute distress. Orientation- Oriented X3. Build & Nutrition- Well nourished and Well developed.   Head and Neck Head- normocephalic, atraumatic . Neck Global Assessment- supple. no bruit auscultated on the right and no bruit auscultated on the left.   Eye Pupil- Bilateral- Regular and Round. Motion- Bilateral- EOMI.   Chest and Lung Exam Auscultation: Breath sounds:- clear at anterior chest wall and - clear at posterior chest wall. Adventitious sounds:- No Adventitious sounds.   Cardiovascular Auscultation:Rhythm- Regular rate and rhythm. Heart Sounds- S1 WNL and S2 WNL. Murmurs & Other Heart Sounds:Auscultation of the heart reveals - No Murmurs.   Abdomen Palpation/Percussion:Tenderness- Abdomen is non-tender  to palpation. Rigidity (guarding)- Abdomen is soft. Auscultation:Auscultation of the abdomen reveals - Bowel sounds normal.   Female Genitourinary Not done, not pertinent to present illness  Musculoskeletal She is in no distress. Her right knee shows trace effusion. She has a valgus deformity. Range 5 to 125 with no instability.  Assessment & Plan Osteoarthritis, knee (715.96)  Note: Patient is for a right total knee replacement by Dr. Lequita Halt.  Plan is to go home.  PCP - Dr.Vyas - Patient has been seen preoperatively and felt to be stable for surgery. Cardiology - Dr. Craig Staggers   Signed electronically by Roberts Gaudy, PA-C

## 2012-03-23 NOTE — Anesthesia Postprocedure Evaluation (Signed)
  Anesthesia Post-op Note  Patient: Debra Richards  Procedure(s) Performed: Procedure(s) (LRB): TOTAL KNEE ARTHROPLASTY (Right)  Patient Location: PACU  Anesthesia Type: Spinal  Level of Consciousness: awake and alert   Airway and Oxygen Therapy: Patient Spontanous Breathing  Post-op Pain: mild  Post-op Assessment: Post-op Vital signs reviewed, Patient's Cardiovascular Status Stable, Respiratory Function Stable, Patent Airway and No signs of Nausea or vomiting  Post-op Vital Signs: stable  Complications: No apparent anesthesia complications

## 2012-03-23 NOTE — Transfer of Care (Signed)
Immediate Anesthesia Transfer of Care Note  Patient: Debra Richards  Procedure(s) Performed: Procedure(s) (LRB) with comments: TOTAL KNEE ARTHROPLASTY (Right)  Patient Location: PACU  Anesthesia Type:Spinal  Level of Consciousness: awake and patient cooperative  Airway & Oxygen Therapy: Patient Spontanous Breathing and Patient connected to face mask oxygen  Post-op Assessment: Report given to PACU RN and Post -op Vital signs reviewed and stable  Post vital signs: Reviewed and stable  Complications: No apparent anesthesia complications

## 2012-03-23 NOTE — Anesthesia Preprocedure Evaluation (Addendum)
Anesthesia Evaluation  Patient identified by MRN, date of birth, ID band Patient awake    Reviewed: Allergy & Precautions, H&P , NPO status , Patient's Chart, lab work & pertinent test results, reviewed documented beta blocker date and time   Airway Mallampati: II TM Distance: >3 FB Neck ROM: full    Dental No notable dental hx. (+) Teeth Intact and Dental Advisory Given   Pulmonary neg pulmonary ROS,  breath sounds clear to auscultation  Pulmonary exam normal       Cardiovascular + CAD + dysrhythmias Atrial Fibrillation and Supra Ventricular Tachycardia + Valvular Problems/Murmurs MVP Rhythm:regular Rate:Normal  LBBB   Neuro/Psych negative neurological ROS  negative psych ROS   GI/Hepatic negative GI ROS, Neg liver ROS, hiatal hernia, GERD-  Medicated and Controlled,  Endo/Other  negative endocrine ROSHypothyroidism   Renal/GU negative Renal ROS  negative genitourinary   Musculoskeletal  (+) Arthritis -, Rheumatoid disorders,    Abdominal   Peds  Hematology negative hematology ROS (+)   Anesthesia Other Findings   Reproductive/Obstetrics negative OB ROS                          Anesthesia Physical Anesthesia Plan  ASA: III  Anesthesia Plan: Spinal   Post-op Pain Management:    Induction:   Airway Management Planned:   Additional Equipment:   Intra-op Plan:   Post-operative Plan:   Informed Consent: I have reviewed the patients History and Physical, chart, labs and discussed the procedure including the risks, benefits and alternatives for the proposed anesthesia with the patient or authorized representative who has indicated his/her understanding and acceptance.   Dental Advisory Given  Plan Discussed with: CRNA and Surgeon  Anesthesia Plan Comments:         Anesthesia Quick Evaluation

## 2012-03-23 NOTE — Interval H&P Note (Signed)
History and Physical Interval Note:  03/23/2012 1:03 PM  Debra Richards  has presented today for surgery, with the diagnosis of Osteoarthritis of the Right Knee  The various methods of treatment have been discussed with the patient and family. After consideration of risks, benefits and other options for treatment, the patient has consented to  Procedure(s) (LRB) with comments: TOTAL KNEE ARTHROPLASTY (Right) as a surgical intervention .  The patient's history has been reviewed, patient examined, no change in status, stable for surgery.  I have reviewed the patient's chart and labs.  Questions were answered to the patient's satisfaction.     Loanne Drilling

## 2012-03-23 NOTE — Anesthesia Procedure Notes (Signed)
Spinal  Patient location during procedure: OR Start time: 03/23/2012 1:20 PM Staffing Performed by: resident/CRNA  Preanesthetic Checklist Completed: patient identified, site marked, surgical consent, pre-op evaluation, timeout performed, IV checked, risks and benefits discussed and monitors and equipment checked Spinal Block Patient position: sitting Prep: Betadine Patient monitoring: heart rate, continuous pulse ox and blood pressure Approach: midline Location: L3-4 Injection technique: single-shot Needle Needle gauge: 22 G Needle length: 5 cm Needle insertion depth: 4 cm Assessment Sensory level: T10

## 2012-03-23 NOTE — Op Note (Signed)
Pre-operative diagnosis- Osteoarthritis Right knee(s)  Post-operative diagnosis- Osteoarthritis  Right knee(s)  Procedure-   Right Total Knee Arthroplasty  Surgeon- Gus Rankin. Jye Fariss, MD  Assistant- Dimitri Ped, PA-C   Anesthesia-  Spinal   EBL- * No blood loss amount entered *   Drains Hemovac   Tourniquet time  Total Tourniquet Time Documented: Thigh (Right) - 29 minutes   Complications- None  Condition-PACU - hemodynamically stable.   Brief Clinical Note  Debra Richards is a 78 y.o. year old female with end stage OA of her right knee with progressively worsening pain and dysfunction. She has constant pain, with activity and at rest and significant functional deficits with difficulties even with ADLs. She has had extensive non-op management including analgesics, injections of cortisone and viscosupplements, and home exercise program, but remains in significant pain with significant dysfunction.Radiographs show bone on bone arthritis lateral and patellofemoral compartments with valgus deformity. She presents now for right Total Knee Arthroplasty.   Procedure in detail---       The patient is brought into the operating room and positioned supine on the operating table. After successful administration of Spinal anesthetic, a tourniquet is placed high on the Right thigh(s) and the lower extremity is prepped and draped in the usual sterile fashion. Time out is performed by the operating team and then the Right  lower extremity is wrapped in Esmarch, knee flexed and the tourniquet inflated to 300 mmHg.       A midline incision is made with a ten blade through the subcutaneous tissue to the level of the extensor mechanism. A fresh blade is used to make a lateral parapatellar arthrotomy due to the patients' valgus deformity. Soft tissue over the proximal lateral tibia is subperiosteally elevated to the joint line with a knife to the posterolateral corner but not including the  structures of the posterolateral corner. Soft tissue over the proximal medial tibia is elevated with attention being paid to avoiding the patellar tendon on the tibial tubercle. The patella is everted medially, knee flexed 90 degrees and the ACL and PCL are removed. Findings are bone on bone lateral and patellofemoral with large lateral osteophytes. .       The drill is used to create a starting hole in the distal femur and the canal is thoroughly irrigated with sterile saline to remove the fatty contents. The 5 degree Right  valgus alignment guide is placed into the femoral canal and the distal femoral cutting block is pinned to remove 10  mm off the distal femur. Resection is made with an oscillating saw.      The tibia is subluxed forward and the menisci are removed. The extramedullary alignment guide is placed referencing proximally at the medial aspect of the tibial tubercle and distally along the second metatarsal axis and tibial crest. The block is pinned to remove 2mm off the more deficient lateral side. Resection is made with an oscillating saw. Size 3  is the most appropriate size for the tibia and the proximal tibia is prepared with the modular drill and keel punch for that size.      The femoral sizing guide is placed and size 4  Narrow is most appropriate. Rotation is marked off the epicondylar axis and confirmed by creating a rectangular flexion gap at 90 degrees. The size 4  cutting block is pinned in this rotation and the anterior, posterior and chamfer cuts are made with the oscillating saw. The intercondylar block is then placed and  that cut is made.      Trial size 3  tibial component, trial size 4  Narrow posterior stabilized femur and a 10  mm posterior stabilized rotating platform insert trial is placed. Full extension is achieved with excellent varus/valgus and   anterior/posterior balance throughout full range of motion. The patella is everted and thickness measured to be 22  mm. Free  hand resection is taken to 12 mm, a 35 template is placed, lug holes are drilled, trial patella is placed, and it tracks normally. Osteophytes are removed off the posterior femur with the trial in place. All trials are removed and the cut bone surfaces prepared with pulsatile lavage. Cement is mixed and once ready for implantation, the size 3  tibial implant, size 4 narrow posterior stabilized femoral component, and the size 35  patella are cemented in place and the patella is held with the clamp. The trial insert is placed and the knee held in full extension. All extruded cement is removed and once the cement is hard the permanent 10  mm posterior stabilized rotating platform insert is placed into the tibial tray.      The wound is copiously irrigated with saline solution and the tourniquet is released for a total   tourniquet time of 29  minutes. Bleeding is identified and controlled with electrocautery. The extensor mechanism is closed with interrupted #1 PDS leaving open a small area from the superior to inferior pole of the patella to serve as a mini lateral release. Flexion against gravity is 140  degrees and the patella tracks normally. Subcutaneous tissue is closed with 2.0 vicryl and subcuticular with running 4.0 Monocryl. The catheter for the Marcaine pain pump is placed and the pump is initiated. The incision is cleaned and dried and steri-strips and a bulky sterile dressing are applied. The limb is placed into a knee immobilizer and the patient is awakened and transported to recovery in stable condition.      Please note that a surgical assistant was a medical necessity for this procedure in order to perform it in a safe and expeditious manner. Surgical assistant was necessary to retract the ligaments and vital neurovascular structures to prevent injury to them and also necessary for proper positioning of the limb to allow for anatomic placement of the prosthesis.    Gus Rankin Hiawatha Merriott,  MD    03/23/2012, 2:22 PM

## 2012-03-24 ENCOUNTER — Encounter (HOSPITAL_COMMUNITY): Payer: Self-pay | Admitting: Orthopedic Surgery

## 2012-03-24 DIAGNOSIS — D62 Acute posthemorrhagic anemia: Secondary | ICD-10-CM

## 2012-03-24 LAB — CBC
MCV: 93.4 fL (ref 78.0–100.0)
Platelets: 131 10*3/uL — ABNORMAL LOW (ref 150–400)
RBC: 3.51 MIL/uL — ABNORMAL LOW (ref 3.87–5.11)
WBC: 6.3 10*3/uL (ref 4.0–10.5)

## 2012-03-24 LAB — BASIC METABOLIC PANEL
CO2: 24 mEq/L (ref 19–32)
Chloride: 103 mEq/L (ref 96–112)
Creatinine, Ser: 0.66 mg/dL (ref 0.50–1.10)
Sodium: 138 mEq/L (ref 135–145)

## 2012-03-24 MED ORDER — ATENOLOL 12.5 MG HALF TABLET
12.5000 mg | ORAL_TABLET | Freq: Every morning | ORAL | Status: DC
  Administered 2012-03-24 – 2012-03-28 (×4): 12.5 mg via ORAL
  Filled 2012-03-24 (×5): qty 1

## 2012-03-24 NOTE — Progress Notes (Signed)
   Subjective: 1 Day Post-Op Procedure(s) (LRB): TOTAL KNEE ARTHROPLASTY (Right) Patient reports pain as mild and moderate.   Patient seen in rounds with Dr. Lequita Halt. Patient is well, and has had no acute complaints or problems We will start therapy today.  Plan is to go Home after hospital stay.  Objective: Vital signs in last 24 hours: Temp:  [97.2 F (36.2 C)-98 F (36.7 C)] 98 F (36.7 C) (10/29 2200) Pulse Rate:  [55-65] 65  (10/29 2200) Resp:  [14-16] 16  (10/29 2200) BP: (95-127)/(52-68) 127/68 mmHg (10/29 2200) SpO2:  [97 %-100 %] 97 % (10/29 2200)  Intake/Output from previous day:  Intake/Output Summary (Last 24 hours) at 03/24/12 2248 Last data filed at 03/24/12 2247  Gross per 24 hour  Intake    670 ml  Output   3650 ml  Net  -2980 ml    Intake/Output this shift: Total I/O In: 120 [P.O.:120] Out: 525 [Urine:525]  Labs:  [facility] 03/24/12 0433  HGB 11.0*    Basename 03/24/12 0433  WBC 6.3  RBC 3.51*  HCT 32.8*  PLT 131*    Basename 03/24/12 0433  NA 138  K 3.9  CL 103  CO2 24  BUN 9  CREATININE 0.66  GLUCOSE 216*  CALCIUM 8.4   No results found for this basename: LABPT:2,INR:2 in the last 72 hours  EXAM General - Patient is Alert, Appropriate and Oriented Extremity - Neurovascular intact Sensation intact distally Dorsiflexion/Plantar flexion intact Dressing - dressing C/D/I Motor Function - intact, moving foot and toes well on exam.  Hemovac pulled without difficulty.  Past Medical History  Diagnosis Date  . Right ovarian cyst     MONITORED BY DR Eda Paschal  . History of kidney stones   . Rosacea   . MVP (mitral valve prolapse) MILD  . Right ureteral stone   . Coronary artery disease     CARDIOLOGIST- DR Alanda Amass-- LAST VISIT 04-04-11 (will request note)---   (per note cardiolite 2009 negative,  echo jan.2010  . GERD (gastroesophageal reflux disease)     CONTROLLED W/ ACIPHEX AND prn prevacid  . Edema occasional in ankles  .  Hypothyroidism   . Dysrhythmia     HX SVT AND PAF--- occ. palpitations  . LBBB (left bundle branch block) CHRONIC  . H/O hiatal hernia   . RA (rheumatoid arthritis)     followed by dr Kellie Simmering  . Fibroid     right ovary    Assessment/Plan: 1 Day Post-Op Procedure(s) (LRB): TOTAL KNEE ARTHROPLASTY (Right) Principal Problem:  *OA (osteoarthritis) of knee Active Problems:  Postop Acute blood loss anemia  Estimated Body mass index is 22.76 kg/(m^2) as calculated from the following:   Height as of this encounter: 5' 3.386"[Documented in chart 03/18/12[(1.61 m).   Weight as of this encounter: 130 lb 1.1 oz(59 kg). Advance diet Up with therapy Discharge home with home health  DVT Prophylaxis - Xarelto Weight-Bearing as tolerated to right leg No vaccines. D/C O2 and Pulse OX and try on Room [address]  Debra Richards 03/24/2012, 10:48 PM

## 2012-03-24 NOTE — Progress Notes (Signed)
Physical Therapy Treatment Patient Details Name: Debra Richards MRN: [id number] DOB: 10/22/1925 Today's Date: 03/24/2012 Time: 1308-6578 PT Time Calculation (min): 25 min  PT Assessment / Plan / Recommendation Comments on Treatment Session  Improvement in activity tolerance and ability to process information but requires constant cues and repetition for correct task completion    Follow Up Recommendations  Home health PT;Post acute inpatient     Does the patient have the potential to tolerate intense rehabilitation  No, Recommend SNF  Barriers to Discharge        Equipment Recommendations  Rolling walker with 5" wheels    Recommendations for Other Services OT consult  Frequency 7X/week   Plan Discharge plan remains appropriate    Precautions / Restrictions Precautions Precautions: Knee Required Braces or Orthoses: Knee Immobilizer - Right Knee Immobilizer - Right: Discontinue once straight leg raise with < 10 degree lag Restrictions Weight Bearing Restrictions: No   Pertinent Vitals/Pain Pt continues to c/o R knee and thigh pain but unable to rate    Mobility  Bed Mobility Bed Mobility: Sit to Supine;Supine to Sit Supine to Sit: 4: Min assist Sit to Supine: 4: Min assist Details for Bed Mobility Assistance: cues for sequence and min assist for R LE Transfers Transfers: Sit to Stand;Stand to Sit Sit to Stand: 3: Mod assist Stand to Sit: 3: Mod assist Details for Transfer Assistance: cues for LE management and use of UEs Ambulation/Gait Ambulation/Gait Assistance: 1: +2 Total assist Ambulation/Gait: Patient Percentage: 80% Ambulation Distance (Feet): 92 Feet Assistive device: Rolling walker Ambulation/Gait Assistance Details: constant cues for posture, sequence, stride length and position from RW Gait Pattern: Step-to pattern    Exercises Total Joint Exercises Ankle Circles/Pumps: AROM;AAROM;10 reps Quad Sets: AROM;AAROM;10 reps;Supine;Both Heel Slides:  AAROM;10 reps;Supine;Right Straight Leg Raises: AAROM;10 reps;Supine;Right   PT Diagnosis: Difficulty walking  PT Problem List: Decreased strength;Decreased range of motion;Decreased activity tolerance;Decreased mobility;Decreased balance;Decreased knowledge of use of DME;Pain PT Treatment Interventions: DME instruction;Gait training;Stair training;Functional mobility training;Therapeutic activities;Therapeutic exercise;Patient/family education   PT Goals Acute Rehab PT Goals PT Goal Formulation: With patient Time For Goal Achievement: 04/01/12 Potential to Achieve Goals: Good Pt will go Supine/Side to Sit: with supervision PT Goal: Supine/Side to Sit - Progress: Progressing toward goal Pt will go Sit to Supine/Side: with supervision PT Goal: Sit to Supine/Side - Progress: Progressing toward goal Pt will go Sit to Stand: with supervision PT Goal: Sit to Stand - Progress: Progressing toward goal Pt will go Stand to Sit: with supervision PT Goal: Stand to Sit - Progress: Progressing toward goal Pt will Ambulate: 51 - 150 feet;with supervision;with rolling walker PT Goal: Ambulate - Progress: Progressing toward goal Pt will Go Up / Down Stairs: 3-5 stairs;with min assist;with least restrictive assistive device PT Goal: Up/Down Stairs - Progress: Goal set today  Visit Information  Last PT Received On: 03/24/12 Assistance Needed: +2    Subjective Data  Subjective: I want to get up and walk but this thigh is sore Patient Stated Goal: I want to be able to do things and get out of the house   Cognition  Overall Cognitive Status: Impaired Area of Impairment: Memory;Safety/judgement;Awareness of deficits;Problem solving;Following commands;Attention Arousal/Alertness: Awake/alert Orientation Level: Appears intact for tasks assessed Behavior During Session: Other (comment) Current Attention Level: Alternating Memory: Decreased recall of precautions Following Commands: Follows one step  commands inconsistently Safety/Judgement: Decreased awareness of safety precautions Cognition - Other Comments: pt rambling from one topic to another and repeating details frequently but  less so than this am    Balance     End of Session PT - End of Session Equipment Utilized During Treatment: Right knee immobilizer;Gait belt Activity Tolerance: Patient limited by fatigue;Patient tolerated treatment well;Patient limited by pain Patient left: in bed;with call bell/phone within reach Nurse Communication: Mobility status   GP     Christella App 03/24/2012, 3:55 PM

## 2012-03-24 NOTE — Progress Notes (Signed)
03/24/12 2315 Nursing Oneida Alar PA called reg: pt has been very confused since 7pm this evening. Confused to place and time and situation. Combative at times and has refused to get back to bed. Daughter Almira Coaster called at 10pm and is currently at the bedside. Patient calmer with daughter in the room and is reclined in the bed with bed alarm initiated. Order not to give tonight's bedtime dose of tranxene. Will continue to monitor patient. Medical Dr. To see patient tonight.

## 2012-03-24 NOTE — Progress Notes (Signed)
Utilization review completed.  

## 2012-03-24 NOTE — Evaluation (Signed)
Physical Therapy Evaluation Patient Details Name: Debra Richards MRN: [id number] DOB: [birth year] Today's Date: 03/24/2012 Time:  -     PT Assessment / Plan / Recommendation Clinical Impression       PT Assessment  Patient needs continued PT services    Follow Up Recommendations  Home health PT;Post acute inpatient (dependent on acute progress)    Does the patient have the potential to tolerate intense rehabilitation   No, Recommend SNF  Barriers to Discharge        Equipment Recommendations  Rolling walker with 5" wheels    Recommendations for Other Services OT consult   Frequency 7X/week    Precautions / Restrictions Precautions Precautions: Knee Required Braces or Orthoses: Knee Immobilizer - Right Knee Immobilizer - Right: Discontinue once straight leg raise with < 10 degree lag Restrictions Weight Bearing Restrictions: No   Pertinent Vitals/Pain Pt states knee is sore - unable to rate      Mobility  Bed Mobility Bed Mobility: Sit to Supine Sit to Supine: 4: Min assist Details for Bed Mobility Assistance: cues for sequence and min assist for R LE Transfers Transfers: Sit to Stand;Stand to Sit Sit to Stand: 3: Mod assist Stand to Sit: 3: Mod assist Details for Transfer Assistance: cues for LE management and use of UEs Ambulation/Gait Ambulation/Gait Assistance: 1: +2 Total assist Ambulation/Gait: Patient Percentage: 70% Assistive device: Rolling walker Gait Pattern: Step-to pattern    Shoulder Instructions     Exercises Total Joint Exercises Ankle Circles/Pumps: AROM;AAROM;10 reps Quad Sets: AROM;AAROM;10 reps;Supine;Both Heel Slides: AAROM;10 reps;Supine;Right Straight Leg Raises: AAROM;10 reps;Supine;Right   PT Diagnosis: Difficulty walking  PT Problem List: Decreased strength;Decreased range of motion;Decreased activity tolerance;Decreased mobility;Decreased balance;Decreased knowledge of use of DME;Pain PT Treatment Interventions: DME  instruction;Gait training;Stair training;Functional mobility training;Therapeutic activities;Therapeutic exercise;Patient/family education   PT Goals Acute Rehab PT Goals PT Goal Formulation: With patient Time For Goal Achievement: 04/01/12 Potential to Achieve Goals: Good Pt will go Supine/Side to Sit: with supervision PT Goal: Supine/Side to Sit - Progress: Goal set today Pt will go Sit to Supine/Side: with supervision PT Goal: Sit to Supine/Side - Progress: Goal set today Pt will go Sit to Stand: with supervision PT Goal: Sit to Stand - Progress: Goal set today Pt will go Stand to Sit: with supervision PT Goal: Stand to Sit - Progress: Goal set today Pt will Ambulate: 51 - 150 feet;with supervision;with rolling walker PT Goal: Ambulate - Progress: Goal set today Pt will Go Up / Down Stairs: 3-5 stairs;with min assist;with least restrictive assistive device PT Goal: Up/Down Stairs - Progress: Goal set today  Visit Information  Last PT Received On: 03/24/12 Assistance Needed: +2    Subjective Data  Subjective: I haven't been able to do much since last NOvember because of this knee Patient Stated Goal: I want to be able to do things and get out of the house   Prior Functioning  Home Living Lives With: Spouse Available Help at Discharge: Family Type of Home: House Home Access: Stairs to enter Secretary/administrator of Steps: 3 Entrance Stairs-Rails: Right Home Layout: Able to live on main level with bedroom/bathroom Home Adaptive Equipment: Wheelchair - manual Prior Function Level of Independence: Independent;Independent with assistive device(s) Able to Take Stairs?: Yes Communication Communication: No difficulties    Cognition  Overall Cognitive Status: Impaired Area of Impairment: Memory;Safety/judgement;Awareness of deficits;Problem solving;Following commands;Attention Arousal/Alertness: Awake/alert Orientation Level: Appears intact for tasks assessed Behavior  During Session: Other (comment) Current Attention Level: Alternating  Memory: Decreased recall of precautions Following Commands: Follows one step commands inconsistently Safety/Judgement: Decreased awareness of safety precautions Cognition - Other Comments: pt rambling from one topic to another and repeating details frequently    Extremity/Trunk Assessment Right Upper Extremity Assessment RUE ROM/Strength/Tone: [facility] for tasks assessed Left Upper Extremity Assessment LUE ROM/Strength/Tone: WFL for tasks assessed Right Lower Extremity Assessment RLE ROM/Strength/Tone: Deficits RLE ROM/Strength/Tone Deficits: 2+/5 quads with AAROM at knee -10 - 40 Left Lower Extremity Assessment LLE ROM/Strength/Tone: WFL for tasks assessed   Balance    End of Session PT - End of Session Equipment Utilized During Treatment: Right knee immobilizer;Gait belt Activity Tolerance: Patient limited by fatigue Patient left: in chair;with call bell/phone within reach Nurse Communication: Mobility status  GP     Debra Richards 03/24/2012, 3:50 PM

## 2012-03-25 ENCOUNTER — Inpatient Hospital Stay (HOSPITAL_COMMUNITY): Payer: Medicare Other

## 2012-03-25 DIAGNOSIS — D72829 Elevated white blood cell count, unspecified: Secondary | ICD-10-CM | POA: Diagnosis not present

## 2012-03-25 DIAGNOSIS — R404 Transient alteration of awareness: Secondary | ICD-10-CM | POA: Diagnosis present

## 2012-03-25 DIAGNOSIS — E039 Hypothyroidism, unspecified: Secondary | ICD-10-CM

## 2012-03-25 DIAGNOSIS — R410 Disorientation, unspecified: Secondary | ICD-10-CM | POA: Diagnosis not present

## 2012-03-25 DIAGNOSIS — R6889 Other general symptoms and signs: Secondary | ICD-10-CM

## 2012-03-25 LAB — BASIC METABOLIC PANEL
BUN: 12 mg/dL (ref 6–23)
CO2: 21 mEq/L (ref 19–32)
Chloride: 106 mEq/L (ref 96–112)
Creatinine, Ser: 0.72 mg/dL (ref 0.50–1.10)
Glucose, Bld: 117 mg/dL — ABNORMAL HIGH (ref 70–99)

## 2012-03-25 LAB — CBC
HCT: 27.1 % — ABNORMAL LOW (ref 36.0–46.0)
Hemoglobin: 9.3 g/dL — ABNORMAL LOW (ref 12.0–15.0)
MCV: 94.4 fL (ref 78.0–100.0)
RBC: 2.87 MIL/uL — ABNORMAL LOW (ref 3.87–5.11)
WBC: 15.7 10*3/uL — ABNORMAL HIGH (ref 4.0–10.5)

## 2012-03-25 LAB — URINALYSIS, ROUTINE W REFLEX MICROSCOPIC
Bilirubin Urine: NEGATIVE
Glucose, UA: NEGATIVE mg/dL
Ketones, ur: NEGATIVE mg/dL
Nitrite: NEGATIVE
Urobilinogen, UA: 0.2 mg/dL (ref 0.0–1.0)
pH: 6.5 (ref 5.0–8.0)

## 2012-03-25 LAB — URINE MICROSCOPIC-ADD ON

## 2012-03-25 MED ORDER — CLORAZEPATE DIPOTASSIUM 7.5 MG PO TABS
7.5000 mg | ORAL_TABLET | Freq: Every evening | ORAL | Status: DC | PRN
  Administered 2012-03-25 – 2012-03-26 (×2): 7.5 mg via ORAL
  Filled 2012-03-25: qty 1

## 2012-03-25 MED ORDER — LORAZEPAM 2 MG/ML IJ SOLN
INTRAMUSCULAR | Status: AC
  Filled 2012-03-25: qty 1

## 2012-03-25 MED ORDER — LORAZEPAM 2 MG/ML IJ SOLN
1.0000 mg | INTRAMUSCULAR | Status: DC | PRN
  Administered 2012-03-25 (×2): 1 mg via INTRAVENOUS
  Filled 2012-03-25: qty 1

## 2012-03-25 NOTE — Progress Notes (Signed)
Subjective: PT continues to have acute delirium on baseline dementia. Per nursing, no significant improvement since yesterday. Objective: Filed Vitals:   03/25/12 0000 03/25/12 0400 03/25/12 0412 03/25/12 1426  BP:   133/68 106/54  Pulse:   92 76  Temp:   98.2 F (36.8 C) 98 F (36.7 C)  TempSrc:   Oral Oral  Resp: 16 18 18 16   Height:      Weight:      SpO2:   97% 90%   Weight change:   Intake/Output Summary (Last 24 hours) at 03/25/12 1942 Last data filed at 03/25/12 1600  Gross per 24 hour  Intake    660 ml  Output    825 ml  Net   -165 ml    General: Alert but confused.  HEENT: [place]/AT PEERL, EOMI Neck: Trachea midline,  no masses, no thyromegal,y no JVD, no carotid bruit OROPHARYNX:  Moist, No exudate/ erythema/lesions.  Heart: Regular rate and rhythm.  Lungs: Clear to auscultation  Abdomen: Soft, nontender, nondistended, positive bowel sounds, no masses no hepatosplenomegaly noted..  Neuro: No focal neurological deficits noted . Musculoskeletal: No warm swelling or erythema around joints, no spinal tenderness noted. Psychiatric: Patient alert but confusedl.     Lab Results:  Basename 03/25/12 0630 03/24/12 0433  NA 138 138  K 3.9 3.9  CL 106 103  CO2 21 24  GLUCOSE 117* 216*  BUN 12 9  CREATININE 0.72 0.66  CALCIUM 9.1 8.4  MG -- --  PHOS -- --   No results found for this basename: AST:2,ALT:2,ALKPHOS:2,BILITOT:2,PROT:2,ALBUMIN:2 in the last 72 hours No results found for this basename: LIPASE:2,AMYLASE:2 in the last 72 hours  Basename 03/25/12 0630 03/24/12 0433  WBC 15.7* 6.3  NEUTROABS -- --  HGB 9.3* 11.0*  HCT 27.1* 32.8*  MCV 94.4 93.4  PLT 143* 131*   No results found for this basename: CKTOTAL:3,CKMB:3,CKMBINDEX:3,TROPONINI:3 in the last 72 hours No components found with this basename: POCBNP:3 No results found for this basename: DDIMER:2 in the last 72 hours No results found for this basename: HGBA1C:2 in the last 72 hours No results  found for this basename: CHOL:2,HDL:2,LDLCALC:2,TRIG:2,CHOLHDL:2,LDLDIRECT:2 in the last 72 hours  Basename 03/25/12 0630  TSH 0.117*  T4TOTAL --  T3FREE --  THYROIDAB --   No results found for this basename: VITAMINB12:2,FOLATE:2,FERRITIN:2,TIBC:2,IRON:2,RETICCTPCT:2 in the last 72 hours  Micro Results: Recent Results (from the past 240 hour(s))  SURGICAL PCR SCREEN     Status: Normal   Collection Time   03/18/12  1:47 PM      Component Value Range Status Comment   MRSA, PCR NEGATIVE  NEGATIVE Final    Staphylococcus aureus NEGATIVE  NEGATIVE Final     Studies/Results: Dg Chest 2 View  03/18/2012  *RADIOLOGY REPORT*  Clinical Data: Preop right total knee replacement  CHEST - 2 VIEW  Comparison: [facility] chest radiographs dated 05/11/2010.  Findings: Chronic interstitial markings.  Lungs are essentially clear. No pleural effusion or pneumothorax.  Cardiomediastinal silhouette is within normal limits.  Mild degenerative changes of the visualized thoracolumbar spine.  IMPRESSION: No evidence of acute cardiopulmonary disease.   Original Report Authenticated By: Charline Bills, M.D.     Medications: I have reviewed the patient's current medications. Scheduled Meds:   . atenolol  12.5 mg Oral q morning - 10a  . clorazepate  7.5 mg Oral BID  . digoxin  62.5 mcg Oral Daily  . docusate sodium  100 mg Oral BID  . levothyroxine  75  mcg Oral QPM  . LORazepam      . pantoprazole  20 mg Oral q morning - 10a  . rivaroxaban  10 mg Oral Q breakfast   Continuous Infusions:   . bupivacaine ON-Q pain pump    . dextrose 5 % and 0.9% NaCl 75 mL/hr at 03/25/12 1644   PRN Meds:.acetaminophen, acetaminophen, bisacodyl, clorazepate, diphenhydrAMINE, LORazepam, menthol-cetylpyridinium, metoCLOPramide (REGLAN) injection, metoCLOPramide, ondansetron (ZOFRAN) IV, ondansetron, phenol, polyethylene glycol, DISCONTD: methocarbamol (ROBAXIN) IV, DISCONTD: methocarbamol, DISCONTD:  morphine injection,  DISCONTD: oxyCODONE, DISCONTD: traMADol Assessment/Plan: Patient Active Hospital Problem List: Acute delirium: Pt had an abrupt discontinuation of her Tranxene and could certainly be having withdrawal symptoms. She has no evidence of an acute infection in Urine. Will check CXR for any chance of occult respiratory infection in light of new Leukocytosis. This of course could be just secondary to a change in environment. Will continue to monitor.    LOS: 2 days

## 2012-03-25 NOTE — Evaluation (Signed)
Occupational Therapy Evaluation Patient Details Name: Debra Richards MRN: [id number] DOB: 26-Jul-1925 Today's Date: 03/25/2012 Time: 4098-1191 OT Time Calculation (min): 19 min  OT Assessment / Plan / Recommendation Clinical Impression  This 78 year old female was admitted for R TKA.  She is experiencing some confusion (h/o dementia, but much worse now).  Anticipate that pt will benefit from skilled OT as cognition improves.       OT Assessment  Patient needs continued OT Services    Follow Up Recommendations  Skilled nursing facility (unless pt clears and family can manage her at home)    Barriers to Discharge      Equipment Recommendations  Rolling walker with 5" wheels    Recommendations for Other Services    Frequency  Min 2X/week    Precautions / Restrictions Precautions Precautions: Knee Required Braces or Orthoses: Knee Immobilizer - Right Knee Immobilizer - Right: Discontinue once straight leg raise with < 10 degree lag Restrictions Weight Bearing Restrictions: No   Pertinent Vitals/Pain No c/o pain    ADL  Eating/Feeding: Simulated;Set up Where Assessed - Eating/Feeding: Bed level Grooming: Performed;Min guard (for safety) Where Assessed - Grooming: Unsupported sitting Upper Body Bathing: Simulated;Minimal assistance Where Assessed - Upper Body Bathing: Unsupported sitting Lower Body Bathing: Simulated;Moderate assistance Where Assessed - Lower Body Bathing: Supported sit to stand Upper Body Dressing: Simulated;Minimal assistance (lines) Where Assessed - Upper Body Dressing: Unsupported sitting Lower Body Dressing: Simulated;Maximal assistance Where Assessed - Lower Body Dressing: Supported sit to stand Toilet Transfer: Performed;+2 Total assistance (pt very unsteady:  loses balance posteriorly) Toilet Transfer: Patient Percentage: 50% Toilet Transfer Method: Surveyor, minerals: Materials engineer  and Hygiene: Performed;Moderate assistance (hygiene) Where Assessed - Toileting Clothing Manipulation and Hygiene: Standing Transfers/Ambulation Related to ADLs: pt was on 3:1 when I arrived, NT in room, assisted with hygiene and back to bed.  Pt needs +2 for safety:  confused and not following all commands well ADL Comments: can do +1 for sit to stand only:  +2 for transfers due to decreased balance and safety    OT Diagnosis: Generalized weakness  OT Problem List: Decreased strength;Decreased activity tolerance;Impaired balance (sitting and/or standing);Decreased cognition;Decreased safety awareness;Decreased knowledge of use of DME or AE OT Treatment Interventions: Self-care/ADL training;Therapeutic activities;Cognitive remediation/compensation;Patient/family education;Balance training;DME and/or AE instruction   OT Goals Acute Rehab OT Goals OT Goal Formulation: With patient Time For Goal Achievement: 04/01/12 Potential to Achieve Goals: Fair ADL Goals Pt Will Transfer to Toilet: with min assist;3-in-1;Ambulation ADL Goal: Toilet Transfer - Progress: Goal set today Pt Will Perform Toileting - Hygiene: with min assist;Sit to stand from 3-in-1/toilet ADL Goal: Toileting - Hygiene - Progress: Goal set today Pt Will Perform Tub/Shower Transfer: Shower transfer;Ambulation;with mod assist ADL Goal: Tub/Shower Transfer - Progress: Goal set today Miscellaneous OT Goals Miscellaneous OT Goal #1: pt will maintain static standing balance for adls with min guard assist for 3 minutes OT Goal: Miscellaneous Goal #1 - Progress: Goal set today  Visit Information  Last OT Received On: 03/25/12 Assistance Needed: +2    Subjective Data  Subjective: I have that appointment Patient Stated Goal: unable to state   Prior Functioning     Home Living Lives With: Spouse Available Help at Discharge: Family Type of Home: House Home Access: Stairs to enter Secretary/administrator of Steps: 3 Entrance  Stairs-Rails: Right Home Layout: Able to live on main level with bedroom/bathroom Bathroom Shower/Tub: Health visitor: Handicapped height  Home Adaptive Equipment: Wheelchair - manual;Bedside commode/3-in-1 Prior Function Level of Independence: Independent;Independent with assistive device(s) Able to Take Stairs?: Yes Communication Communication: No difficulties         Vision/Perception     Cognition  Overall Cognitive Status: Impaired Area of Impairment: Memory;Safety/judgement;Awareness of deficits;Problem solving;Following commands;Attention Arousal/Alertness: Awake/alert Orientation Level:  (knew she had knee sx; perseverating on appts) Behavior During Session: [facility] for tasks performed Memory: Decreased recall of precautions Following Commands: Follows one step commands inconsistently Safety/Judgement: Decreased awareness of safety precautions    Extremity/Trunk Assessment Right Upper Extremity Assessment RUE ROM/Strength/Tone: Within functional levels Left Upper Extremity Assessment LUE ROM/Strength/Tone: Within functional levels     Mobility Bed Mobility Sit to Supine: 4: Min assist Details for Bed Mobility Assistance: min cues for position Transfers Sit to Stand: 3: Mod assist Details for Transfer Assistance: mod cues for safety     Shoulder Instructions     Exercise     Balance Static Standing Balance Static Standing - Balance Support: Bilateral upper extremity supported Static Standing - Level of Assistance: 3: Mod assist Static Standing - Comment/# of Minutes: 2   End of Session OT - End of Session Activity Tolerance: Patient tolerated treatment well Patient left: in bed;with call bell/phone within reach;with bed alarm set;with family/visitor present  GO     Trish Mancinelli 03/25/2012, 11:06 AM Marica Otter, OTR/L [phone] 03/25/2012

## 2012-03-25 NOTE — Progress Notes (Signed)
PT NOTE:  Attempted PT multiple times.  Deferred after discussion with RN based on pt's level of confusion/agitation.  Will follow in am.

## 2012-03-25 NOTE — Consult Note (Signed)
[facility] History and Physical  LABRITTANY WECHTER ZOX:[id number] DOB: Sep 18, 1925    PCP:   Ignatius Specking., MD   Chief Complaint:  I was asked by Mr Oneida Alar, PA to see patient for confusion.  HPI: Debra Richards is an 78 y.o. female s/p total knee for OA, with no hx of dementia, was found to be intermittently confused tonight.  She was a little agitated, and didn't know where she was.  She had no hx of dementia.  Her daughter came from home, and with her daughter at her bedside, she seems to be doing better, more calm and less confused.  Daughter stated that she acted the same way for a day with her previous knee surgery.  She was given some narcotic before, but it has been more than 12 hours.  I reviewed her medication list, she has been given Decadron IV post op.  She also has been taking Robaxin and Ultram.  She is on digitalis.  When I spoke with her, she converse well, with slight confusion.  She doesn't appear short of breath.  She denied cough, fever, or chills.  Her husband has been sick as well and was in the ER.  She worried about him.  She also hadn't slept much last night nor today.  She usually takes tranxene 7.5mg  PRN q hs sleep.  Rewiew of Systems:  Constitutional: Negative for malaise, fever and chills. No significant weight loss or weight gain Eyes: Negative for eye pain, redness and discharge, diplopia, visual changes, or flashes of light. ENMT: Negative for ear pain, hoarseness, nasal congestion, sinus pressure and sore throat. No headaches; tinnitus, drooling, or problem swallowing. Cardiovascular: Negative for chest pain, palpitations, diaphoresis, dyspnea and peripheral edema. ; No orthopnea, PND Respiratory: Negative for cough, hemoptysis, wheezing and stridor. No pleuritic chestpain. Gastrointestinal: Negative for nausea, vomiting, diarrhea, constipation, abdominal pain, melena, blood in stool, hematemesis, jaundice and rectal bleeding.      Genitourinary: Negative for frequency, dysuria, incontinence,flank pain and hematuria; Musculoskeletal: Negative for back pain and neck pain.  Skin: . Negative for pruritus, rash, abrasions, bruising and skin lesion.; ulcerations Neuro: Negative for headache, lightheadedness and neck stiffness. Negative for weakness, altered level of consciousness , altered mental status, extremity weakness, burning feet, involuntary movement, seizure and syncope.  Psych: negative for anxiety, depression, insomnia, tearfulness, panic attacks, hallucinations, paranoia, suicidal or homicidal ideation    Past Medical History  Diagnosis Date  . Right ovarian cyst     MONITORED BY DR Eda Paschal  . History of kidney stones   . Rosacea   . MVP (mitral valve prolapse) MILD  . Right ureteral stone   . Coronary artery disease     CARDIOLOGIST- DR Alanda Amass-- LAST VISIT 04-04-11 (will request note)---   (per note cardiolite 2009 negative,  echo jan.2010  . GERD (gastroesophageal reflux disease)     CONTROLLED W/ ACIPHEX AND prn prevacid  . Edema occasional in ankles  . Hypothyroidism   . Dysrhythmia     HX SVT AND PAF--- occ. palpitations  . LBBB (left bundle branch block) CHRONIC  . H/O hiatal hernia   . RA (rheumatoid arthritis)     followed by dr Kellie Simmering  . Fibroid     right ovary    Past Surgical History  Procedure Date  . Moles excised 2012    on nose  . Extracorporeal shock wave lithotripsy 09-24-10    and 1990  . Cataract extraction w/ intraocular lens  implant, bilateral  4 yrs ago    both eyes  . Knee arthroscopy 04/17/2011, right    Procedure: ARTHROSCOPY KNEE;  Surgeon: Gus Rankin Aluisio;  Location: [facility];  Service: Orthopedics;  Laterality: Right;  WITH LATERAL MENISCAL DEBRIDEMENT  . Vaginal hysterectomy 1970  . Cholecystectomy 2005  . Total knee arthroplasty 03/23/2012    Procedure: TOTAL KNEE ARTHROPLASTY;  Surgeon: Loanne Drilling, MD;  Location: [facility];  Service:  Orthopedics;  Laterality: Right;    Medications:  HOME MEDS: Prior to Admission medications   Medication Sig Start Date End Date Taking? Authorizing Provider  aspirin EC 81 MG tablet Take 81 mg by mouth every morning.   Yes Historical Provider, MD  atenolol (TENORMIN) 25 MG tablet Take 12.5 mg by mouth every morning.   Yes Historical Provider, MD  calcium carbonate (OS-CAL) 600 MG TABS Take 600 mg by mouth daily.    Yes Historical Provider, MD  clorazepate (TRANXENE) 7.5 MG tablet Take 7.5 mg by mouth 2 (two) times daily. sleep   Yes Historical Provider, MD  digoxin (LANOXIN) 0.125 MG tablet Take 62.5 mcg by mouth every morning.    Yes Historical Provider, MD  doxycycline (VIBRAMYCIN) 100 MG capsule Take 100 mg by mouth daily as needed. rosacea   Yes Historical Provider, MD  HYDROcodone-acetaminophen (NORCO) 5-325 MG per tablet Take 1 tablet by mouth every 6 (six) hours as needed. Pain   Yes Historical Provider, MD  levothyroxine (SYNTHROID, LEVOTHROID) 75 MCG tablet Take 75 mcg by mouth every evening.    Yes Historical Provider, MD  Multiple Vitamin (MULTIVITAMIN PO) Take 1 tablet by mouth daily.    Yes Historical Provider, MD  pantoprazole (PROTONIX) 20 MG tablet Take 20 mg by mouth every morning.    Yes Historical Provider, MD  Skin Protectants, Misc. (CALAZIME SKIN PROTECTANT EX) Apply topically.   Yes Historical Provider, MD  cholecalciferol (VITAMIN D) 1000 UNITS tablet Take 1,000 Units by mouth daily.     Historical Provider, MD     Allergies:  Allergies  Allergen Reactions  . Penicillins Hives    Social History:   reports that she quit smoking about 32 years ago. Her smoking use included Cigarettes. She has a 2.5 pack-year smoking history. She has never used smokeless tobacco. She reports that she does not drink alcohol or use illicit drugs.  Family History: Family History  Problem Relation Age of Onset  . Cancer Mother     COLON  . Heart disease Father     STROKE  .  Stroke Father   . Ovarian cancer Maternal Grandmother      Physical Exam: Filed Vitals:   03/24/12 0228 03/24/12 0657 03/24/12 1400 03/24/12 2200  BP: 117/52 101/52 95/56 127/68  Pulse: 55 55 62 65  Temp: 97.2 F (36.2 C) 97.3 F (36.3 C) 97.6 F (36.4 C)   TempSrc: Oral Oral Oral   Resp: 16 14 15 16   Height:      Weight:      SpO2: 100% 100% 99% 97%   Blood pressure 127/68, pulse 65, temperature 97.6 F (36.4 C), temperature source Oral, resp. rate 16, height 5' 3.39" (1.61 m), weight 59 kg (130 lb 1.1 oz), SpO2 97.00%.  GEN:  Pleasant  patient lying in the stretcher in no acute distress; cooperative with exam. PSYCH:  alert and oriented x3.; does not appear anxious or depressed; affect is appropriate. HEENT: Mucous membranes pink and anicteric; PERRLA; EOM intact; no cervical lymphadenopathy nor thyromegaly  or carotid bruit; no JVD; There were no stridor. Neck is very supple. Breasts:: Not examined CHEST WALL: No tenderness CHEST: Normal respiration, clear to auscultation bilaterally.  HEART: Regular rate and rhythm.  There are no murmur, rub, or gallops.   BACK: No kyphosis or scoliosis; no CVA tenderness ABDOMEN: soft and non-tender; no masses, no organomegaly, normal abdominal bowel sounds; no pannus; no intertriginous candida. There is no rebound and no distention. Rectal Exam: Not done EXTREMITIES: No bone or joint deformity; age-appropriate arthropathy of the hands and knees; no edema; no ulcerations.  There is no calf tenderness. Genitalia: not examined PULSES: 2+ and symmetric SKIN: Normal hydration no rash or ulceration CNS: Cranial nerves 2-12 grossly intact no focal lateralizing neurologic deficit.  Speech is fluent; uvula elevated with phonation, facial symmetry and tongue midline. DTR are normal bilaterally, cerebella exam is intact, barbinski is negative and strengths are equaled bilaterally.  No sensory loss.   Labs on Admission:  Basic Metabolic Panel:  Lab  03/24/12 0433 03/18/12 1400  NA 138 140  K 3.9 4.2  CL 103 102  CO2 24 29  GLUCOSE 216* 89  BUN 9 16  CREATININE 0.66 0.78  CALCIUM 8.4 9.5  MG -- --  PHOS -- --   Liver Function Tests:  Lab 03/18/12 1400  AST 23  ALT 12  ALKPHOS 76  BILITOT 0.4  PROT 6.8  ALBUMIN 3.9   No results found for this basename: LIPASE:5,AMYLASE:5 in the last 168 hours No results found for this basename: AMMONIA:5 in the last 168 hours CBC:  Lab 03/24/12 0433 03/18/12 1400  WBC 6.3 5.6  NEUTROABS -- --  HGB 11.0* 13.8  HCT 32.8* 40.5  MCV 93.4 94.2  PLT 131* 158   Cardiac Enzymes: No results found for this basename: CKTOTAL:5,CKMB:5,CKMBINDEX:5,TROPONINI:5 in the last 168 hours  CBG: No results found for this basename: GLUCAP:5 in the last 168 hours   Radiological Exams on Admission: No results found.  Assessment/Plan Present on Admission:  .OA (osteoarthritis) of knee .Altered awareness, transient S/p total knee Hypothyroidism Arythmia  PLAN:  She appears to have intermittent delirium.  I suspect it is from medication and environmental change aggravated with lack of sleep.  Medications include steroid, Dig, narcotics, muscle relaxant.  I will check an UA, TSH, Dig level.  Will continue her meds except will d/c robaxin and ultram.  I will continue tranxene, so she can get some sleep.  We should continue to follow her clinically.  If she doesn't clear, will need imaging of her head, but she doesn't appear to have had a stroke or TIA.  Thank you for the consult.  We will follow with you.  Other plans as per orders.  Code Status: FULL Unk Lightning, MD. [facility] Pager [phone] 7pm to 7am.  03/25/2012, 12:21 AM

## 2012-03-25 NOTE — Progress Notes (Signed)
Subjective: 2 Days Post-Op Procedure(s) (LRB): TOTAL KNEE ARTHROPLASTY (Right) Patient became confused last night.  Medicine consult called.  Patient seen and evaluated by Hospitalists service.  Hospitalist: PLAN: She appears to have intermittent delirium. I suspect it is from medication and environmental change aggravated with lack of sleep. Medications include steroid, Dig, narcotics, muscle relaxant. I will check an UA, TSH, Dig level. Will continue her meds except will d/c robaxin and ultram. I will continue tranxene, so she can get some sleep. We should continue to follow her clinically. If she doesn't clear, will need imaging of her head, but she doesn't appear to have had a stroke or TIA. Thank you for the consult. We will follow with you.  Patient seen in rounds by Dr. Lequita Halt. Patient is having problems with confusion. Plan is to go Home after hospital stay.  Objective: Vital signs in last 24 hours: Temp:  [97.6 F (36.4 C)-98.2 F (36.8 C)] 98.2 F (36.8 C) (10/30 0412) Pulse Rate:  [62-92] 92  (10/30 0412) Resp:  [15-18] 18  (10/30 0412) BP: (95-133)/(56-68) 133/68 mmHg (10/30 0412) SpO2:  [97 %-99 %] 97 % (10/30 0412)  Intake/Output from previous day:  Intake/Output Summary (Last 24 hours) at 03/25/12 0811 Last data filed at 03/25/12 0556  Gross per 24 hour  Intake    830 ml  Output   2875 ml  Net  -2045 ml    Intake/Output this shift:    Labs:  Basename 03/25/12 0630 03/24/12 0433  HGB 9.3* 11.0*    Basename 03/25/12 0630 03/24/12 0433  WBC 15.7* 6.3  RBC 2.87* 3.51*  HCT 27.1* 32.8*  PLT 143* 131*    Basename 03/25/12 0630 03/24/12 0433  NA 138 138  K 3.9 3.9  CL 106 103  CO2 21 24  BUN 12 9  CREATININE 0.72 0.66  GLUCOSE 117* 216*  CALCIUM 9.1 8.4   No results found for this basename: LABPT:2,INR:2 in the last 72 hours  EXAM General - Patient is Alert and Confused Extremity - Sensation intact distally Dressing/Incision - clean, dry, no  drainage Motor Function - intact, moving foot and toes well on exam.   Past Medical History  Diagnosis Date  . Right ovarian cyst     MONITORED BY DR Eda Paschal  . History of kidney stones   . Rosacea   . MVP (mitral valve prolapse) MILD  . Right ureteral stone   . Coronary artery disease     CARDIOLOGIST- DR Alanda Amass-- LAST VISIT 04-04-11 (will request note)---   (per note cardiolite 2009 negative,  echo jan.2010  . GERD (gastroesophageal reflux disease)     CONTROLLED W/ ACIPHEX AND prn prevacid  . Edema occasional in ankles  . Hypothyroidism   . Dysrhythmia     HX SVT AND PAF--- occ. palpitations  . LBBB (left bundle branch block) CHRONIC  . H/O hiatal hernia   . RA (rheumatoid arthritis)     followed by dr Kellie Simmering  . Fibroid     right ovary    Assessment/Plan: 2 Days Post-Op Procedure(s) (LRB): TOTAL KNEE ARTHROPLASTY (Right) Principal Problem:  *OA (osteoarthritis) of knee Active Problems:  Postop Acute blood loss anemia  Altered awareness, transient  Estimated Body mass index is 22.76 kg/(m^2) as calculated from the following:   Height as of this encounter: 5' 3.386"[Documented in chart 03/18/12[(1.61 m).   Weight as of this encounter: 130 lb 1.1 oz(59 kg).  Appreciate Medicine assistance with this patient. Hopefully, the confusion  will resolve shortly and be able to participate with therapy. Continue to monitor progress. DVT Prophylaxis - Xarelto Weight-Bearing as tolerated to right leg  PERKINS, ALEXZANDREW 03/25/2012, 8:11 AM

## 2012-03-26 DIAGNOSIS — R404 Transient alteration of awareness: Secondary | ICD-10-CM

## 2012-03-26 LAB — CBC WITH DIFFERENTIAL/PLATELET
Basophils Absolute: 0 10*3/uL (ref 0.0–0.1)
Eosinophils Absolute: 0 10*3/uL (ref 0.0–0.7)
Eosinophils Relative: 0 % (ref 0–5)
Lymphocytes Relative: 17 % (ref 12–46)
Lymphs Abs: 1.5 10*3/uL (ref 0.7–4.0)
MCH: 32.7 pg (ref 26.0–34.0)
MCV: 94.6 fL (ref 78.0–100.0)
Neutrophils Relative %: 74 % (ref 43–77)
Platelets: 126 10*3/uL — ABNORMAL LOW (ref 150–400)
RBC: 2.57 MIL/uL — ABNORMAL LOW (ref 3.87–5.11)
RDW: 14.1 % (ref 11.5–15.5)
WBC: 9 10*3/uL (ref 4.0–10.5)

## 2012-03-26 LAB — BASIC METABOLIC PANEL
Calcium: 8.3 mg/dL — ABNORMAL LOW (ref 8.4–10.5)
GFR calc non Af Amer: 78 mL/min — ABNORMAL LOW (ref >90)
Glucose, Bld: 107 mg/dL — ABNORMAL HIGH (ref 70–99)
Potassium: 3.3 mEq/L — ABNORMAL LOW (ref 3.5–5.1)
Sodium: 140 mEq/L (ref 135–145)

## 2012-03-26 MED ORDER — KETOROLAC TROMETHAMINE 15 MG/ML IJ SOLN
7.5000 mg | Freq: Once | INTRAMUSCULAR | Status: AC
  Administered 2012-03-26: 7.5 mg via INTRAVENOUS
  Filled 2012-03-26: qty 1

## 2012-03-26 MED ORDER — DOXYCYCLINE HYCLATE 100 MG PO TABS
100.0000 mg | ORAL_TABLET | Freq: Every day | ORAL | Status: DC
  Administered 2012-03-27: 100 mg via ORAL
  Filled 2012-03-26 (×2): qty 1

## 2012-03-26 MED ORDER — DOXYCYCLINE HYCLATE 100 MG PO TABS
100.0000 mg | ORAL_TABLET | Freq: Every day | ORAL | Status: AC
  Administered 2012-03-26: 100 mg via ORAL
  Filled 2012-03-26: qty 1

## 2012-03-26 MED ORDER — HYDROCODONE-ACETAMINOPHEN 5-325 MG PO TABS
1.0000 | ORAL_TABLET | ORAL | Status: DC | PRN
  Administered 2012-03-26 – 2012-03-28 (×6): 1 via ORAL
  Filled 2012-03-26 (×6): qty 1

## 2012-03-26 NOTE — Progress Notes (Signed)
03/26/2012 Colleen Can BSN RN CCM [phone] Per progress notes. Pt with confusion on 10/30; having pain today; 2+ assist with physical therapy/Initial plans were for patient to return to her home in [facility] where daughter would be caregiver; CM will follow progress.

## 2012-03-26 NOTE — Progress Notes (Signed)
Subjective: Pt is calm and coherent this afternoon. She is having sensible conversation. Her husband and niece are at the bedside and endorse that the patient is back to her baseline of functioning Objective: Filed Vitals:   03/26/12 0634 03/26/12 0930 03/26/12 1030 03/26/12 1456  BP: 109/65 102/61 105/66 110/56  Pulse: 68 69  69  Temp: 98.4 F (36.9 C) 98.2 F (36.8 C)  98.3 F (36.8 C)  TempSrc: Oral Oral  Oral  Resp: 18 17  18   Height:      Weight:      SpO2: 97% 97%  96%   Weight change:   Intake/Output Summary (Last 24 hours) at 03/26/12 1922 Last data filed at 03/26/12 1900  Gross per 24 hour  Intake   1575 ml  Output    206 ml  Net   1369 ml    General: Alert and oriented x3.  HEENT: [place]/AT PEERL, EOMI Neck: Trachea midline,  no masses, no thyromegal,y no JVD, no carotid bruit OROPHARYNX:  Moist, No exudate/ erythema/lesions.  Heart: Regular rate and rhythm.  Lungs: Clear to auscultation   Neuro: No focal neurological deficits noted . Musculoskeletal: No warm swelling or erythema around joints, no spinal tenderness noted.      Lab Results:  Basename 03/26/12 0440 03/25/12 0630  NA 140 138  K 3.3* 3.9  CL 107 106  CO2 24 21  GLUCOSE 107* 117*  BUN 15 12  CREATININE 0.70 0.72  CALCIUM 8.3* 9.1  MG -- --  PHOS -- --   No results found for this basename: AST:2,ALT:2,ALKPHOS:2,BILITOT:2,PROT:2,ALBUMIN:2 in the last 72 hours No results found for this basename: LIPASE:2,AMYLASE:2 in the last 72 hours  Basename 03/26/12 0440 03/25/12 0630  WBC 9.0 15.7*  NEUTROABS 6.6 --  HGB 8.4* 9.3*  HCT 24.3* 27.1*  MCV 94.6 94.4  PLT 126* 143*   No results found for this basename: CKTOTAL:3,CKMB:3,CKMBINDEX:3,TROPONINI:3 in the last 72 hours No components found with this basename: POCBNP:3 No results found for this basename: DDIMER:2 in the last 72 hours No results found for this basename: HGBA1C:2 in the last 72 hours No results found for this basename:  CHOL:2,HDL:2,LDLCALC:2,TRIG:2,CHOLHDL:2,LDLDIRECT:2 in the last 72 hours  Basename 03/25/12 0630  TSH 0.117*  T4TOTAL --  T3FREE --  THYROIDAB --   No results found for this basename: VITAMINB12:2,FOLATE:2,FERRITIN:2,TIBC:2,IRON:2,RETICCTPCT:2 in the last 72 hours  Micro Results: Recent Results (from the past 240 hour(s))  SURGICAL PCR SCREEN     Status: Normal   Collection Time   03/18/12  1:47 PM      Component Value Range Status Comment   MRSA, PCR NEGATIVE  NEGATIVE Final    Staphylococcus aureus NEGATIVE  NEGATIVE Final     Studies/Results: Dg Chest 2 View  03/18/2012  *RADIOLOGY REPORT*  Clinical Data: Preop right total knee replacement  CHEST - 2 VIEW  Comparison: [facility] chest radiographs dated 05/11/2010.  Findings: Chronic interstitial markings.  Lungs are essentially clear. No pleural effusion or pneumothorax.  Cardiomediastinal silhouette is within normal limits.  Mild degenerative changes of the visualized thoracolumbar spine.  IMPRESSION: No evidence of acute cardiopulmonary disease.   Original Report Authenticated By: Charline Bills, M.D.     Medications: I have reviewed the patient's current medications. Scheduled Meds:    . atenolol  12.5 mg Oral q morning - 10a  . clorazepate  7.5 mg Oral BID  . digoxin  62.5 mcg Oral Daily  . docusate sodium  100 mg Oral BID  .  doxycycline  100 mg Oral Daily  . doxycycline  100 mg Oral Daily  . ketorolac  7.5 mg Intravenous Once  . levothyroxine  75 mcg Oral QPM  . pantoprazole  20 mg Oral q morning - 10a  . rivaroxaban  10 mg Oral Q breakfast   Continuous Infusions:    . DISCONTD: bupivacaine ON-Q pain pump    . DISCONTD: dextrose 5 % and 0.9% NaCl 75 mL/hr at 03/25/12 2330   PRN Meds:.acetaminophen, acetaminophen, bisacodyl, clorazepate, diphenhydrAMINE, HYDROcodone-acetaminophen, LORazepam, menthol-cetylpyridinium, metoCLOPramide (REGLAN) injection, metoCLOPramide, ondansetron (ZOFRAN) IV, ondansetron, phenol,  polyethylene glycol Assessment/Plan: Patient Active Hospital Problem List: Acute delirium: Pt had an acute delirium which is likely secondary to Tranxene withdrawal. The patient is now back to her baseline at this time I will sign off. Please call with further questions. Thank you for inviting our assistance in the care of this patient.   LOS: 3 days

## 2012-03-26 NOTE — Progress Notes (Signed)
Physical Therapy Treatment Patient Details Name: Debra Richards MRN: [id number] DOB: 1925-07-30 Today's Date: 03/26/2012 Time: 2130-8657 PT Time Calculation (min): 29 min  PT Assessment / Plan / Recommendation Comments on Treatment Session       Follow Up Recommendations  Home health PT;Post acute inpatient     Does the patient have the potential to tolerate intense rehabilitation  No, Recommend SNF  Barriers to Discharge        Equipment Recommendations  Rolling walker with 5" wheels    Recommendations for Other Services OT consult  Frequency 7X/week   Plan Discharge plan remains appropriate    Precautions / Restrictions Precautions Precautions: Knee Required Braces or Orthoses: Knee Immobilizer - Right Knee Immobilizer - Right: Discontinue once straight leg raise with < 10 degree lag Restrictions Weight Bearing Restrictions: No   Pertinent Vitals/Pain     Mobility  Transfers Sit to Stand: 3: Mod assist    Exercises Total Joint Exercises Ankle Circles/Pumps: AROM;AAROM;20 reps;Both Quad Sets: AROM;AAROM;Supine;Both;20 reps Short Arc Quad: AAROM;20 reps;Supine;Both Heel Slides: AAROM;Supine;Right;20 reps Straight Leg Raises: AAROM;Supine;Right;20 reps   PT Diagnosis:    PT Problem List:   PT Treatment Interventions:     PT Goals Acute Rehab PT Goals PT Goal Formulation: With patient Time For Goal Achievement: 04/01/12 Potential to Achieve Goals: Good  Visit Information  Last PT Received On: 03/26/12 Assistance Needed: +2    Subjective Data  Subjective: I have been back and forth to the bathroom  3 times Patient Stated Goal: I want to be able to do things and get out of the house   Cognition  Overall Cognitive Status: Impaired Area of Impairment: Safety/judgement;Attention Arousal/Alertness: Awake/alert Orientation Level: Appears intact for tasks assessed Behavior During Session: [facility] for tasks performed Current Attention Level:  Alternating Memory: Decreased recall of precautions Following Commands: Follows one step commands inconsistently Safety/Judgement: Decreased awareness of safety precautions Cognition - Other Comments: distracts herself talking and in busy environment    Balance  Static Standing Balance Static Standing - Balance Support: Bilateral upper extremity supported Static Standing - Level of Assistance: 3: Mod assist Static Standing - Comment/# of Minutes: 2  End of Session PT - End of Session Activity Tolerance: Patient limited by fatigue;Patient tolerated treatment well;Patient limited by pain Patient left: in bed;with call bell/phone within reach Nurse Communication: Mobility status CPM Right Knee CPM Right Knee: On   GP     Debra Richards 03/26/2012, 3:52 PM

## 2012-03-26 NOTE — Progress Notes (Signed)
Physical Therapy Treatment Patient Details Name: Debra Richards MRN: [id number] DOB: 07/01/1925 Today's Date: 03/26/2012 Time: 7829-5621 PT Time Calculation (min): 25 min  PT Assessment / Plan / Recommendation Comments on Treatment Session       Follow Up Recommendations  Home health PT;Post acute inpatient     Does the patient have the potential to tolerate intense rehabilitation  No, Recommend SNF  Barriers to Discharge        Equipment Recommendations  Rolling walker with 5" wheels    Recommendations for Other Services OT consult  Frequency 7X/week   Plan Discharge plan remains appropriate    Precautions / Restrictions Precautions Precautions: Knee Required Braces or Orthoses: Knee Immobilizer - Right Knee Immobilizer - Right: Discontinue once straight leg raise with < 10 degree lag Restrictions Weight Bearing Restrictions: No   Pertinent Vitals/Pain It hurts less than this morning that's for sure    Mobility  Bed Mobility Bed Mobility: Supine to Sit Supine to Sit: 3: Mod assist Details for Bed Mobility Assistance: cues for sequence Transfers Transfers: Sit to Stand;Stand to Sit Sit to Stand: 3: Mod assist Stand to Sit: 3: Mod assist Details for Transfer Assistance: cues for LE management, use of UEs and saftey Ambulation/Gait Ambulation/Gait Assistance: 1: +2 Total assist Ambulation/Gait: Patient Percentage: 70% Ambulation Distance (Feet): 74 Feet Assistive device: Rolling walker Ambulation/Gait Assistance Details: cues for BOS, sequence, position from RW, increased WB on R heel Gait Pattern: Step-to pattern    Exercises     PT Diagnosis:    PT Problem List:   PT Treatment Interventions:     PT Goals Acute Rehab PT Goals PT Goal Formulation: With patient Time For Goal Achievement: 04/01/12 Potential to Achieve Goals: Good Pt will go Supine/Side to Sit: with supervision PT Goal: Supine/Side to Sit - Progress: Progressing toward goal Pt  will go Sit to Supine/Side: with supervision PT Goal: Sit to Supine/Side - Progress: Progressing toward goal Pt will go Sit to Stand: with supervision PT Goal: Sit to Stand - Progress: Progressing toward goal Pt will go Stand to Sit: with supervision PT Goal: Stand to Sit - Progress: Progressing toward goal Pt will Ambulate: 51 - 150 feet;with supervision;with rolling walker PT Goal: Ambulate - Progress: Progressing toward goal  Visit Information  Last PT Received On: 03/26/12 Assistance Needed: +2    Subjective Data  Subjective: I am doing better than I was Patient Stated Goal: I want to be able to do things and get out of the house   Cognition  Overall Cognitive Status: Impaired Area of Impairment: Safety/judgement;Attention Arousal/Alertness: Awake/alert Orientation Level: Appears intact for tasks assessed Behavior During Session: [facility] for tasks performed Current Attention Level: Alternating Memory: Decreased recall of precautions Following Commands: Follows one step commands inconsistently Safety/Judgement: Decreased awareness of safety precautions Cognition - Other Comments: distracts herself talking and in busy environment    Balance  Static Standing Balance Static Standing - Balance Support: Bilateral upper extremity supported Static Standing - Level of Assistance: 3: Mod assist Static Standing - Comment/# of Minutes: 2  End of Session CPM Right Knee CPM Right Knee: Off   GP     Maleni Seyer 03/26/2012, 1:24 PM

## 2012-03-26 NOTE — Progress Notes (Signed)
Occupational Therapy Treatment Patient Details Name: Debra Richards MRN: [id number] DOB: Jun 15, 1925 Today's Date: 03/26/2012 Time: 1191-4782 OT Time Calculation (min): 18 min  OT Assessment / Plan / Recommendation Comments on Treatment Session      Follow Up Recommendations  Skilled nursing facility unless family prefers Home with 24/7    Barriers to Discharge       Equipment Recommendations  Rolling walker with 5" wheels    Recommendations for Other Services    Frequency     Plan      Precautions / Restrictions Precautions Precautions: Knee Required Braces or Orthoses: Knee Immobilizer - Right Knee Immobilizer - Right: Discontinue once straight leg raise with < 10 degree lag Restrictions Weight Bearing Restrictions: No   Pertinent Vitals/Pain No c/o pain    ADL  Toilet Transfer: Performed;+2 Total assistance Toilet Transfer: Patient Percentage: 50% Toilet Transfer Method: Stand pivot Transfers/Ambulation Related to ADLs: static standing mod to max A.  +2 for ambulation for safety.  cotx with PT ADL Comments: Pt did not have to use bathroom at time of tx.  She is much more clear today, following commands.  continues to need cues for safety and sequence    OT Diagnosis:    OT Problem List:   OT Treatment Interventions:     OT Goals ADL Goals Pt Will Transfer to Toilet: with min assist;3-in-1;Ambulation ADL Goal: Toilet Transfer - Progress: Progressing toward goals Miscellaneous OT Goals Miscellaneous OT Goal #1: pt will maintain static standing balance for adls with min guard assist for 3 minutes OT Goal: Miscellaneous Goal #1 - Progress: Progressing toward goals  Visit Information  Last OT Received On: 03/26/12 Assistance Needed: +2    Subjective Data      Prior Functioning       Cognition  Overall Cognitive Status: Impaired Area of Impairment: Safety/judgement;Attention Arousal/Alertness: Awake/alert Behavior During Session: [facility] for tasks  performed Cognition - Other Comments: distracts herself talking and in busy environment    Mobility  Shoulder Instructions Transfers Sit to Stand: 3: Mod assist       Exercises      Balance Static Standing Balance Static Standing - Balance Support: Bilateral upper extremity supported Static Standing - Level of Assistance: 3: Mod assist Static Standing - Comment/# of Minutes: 2   End of Session OT - End of Session Activity Tolerance: Patient tolerated treatment well Patient left: in chair;with call bell/phone within reach;with family/visitor present CPM Right Knee CPM Right Knee: Off  GO     Mylie Mccurley 03/26/2012, 12:19 PM Marica Otter, OTR/L [phone] 03/26/2012

## 2012-03-26 NOTE — Progress Notes (Signed)
   Subjective: 3 Days Post-Op Procedure(s) (LRB): TOTAL KNEE ARTHROPLASTY (Right) Patient reports pain as mild and moderate.   Patient seen in rounds with Dr. Lequita Halt.  Family at bedside.  Patient better today.   Improved from yesterday.  She recognized Dr. Lequita Halt and the PA upon entering the room.  Still in pain.  Will slowly reinitiate some of the meds.  Try Toradol IV and she was on hydrocodone at home. Patient is having problems with pain in the knee, requiring pain medications Plan is to go Home after hospital stay.  Objective: Vital signs in last 24 hours: Temp:  [97.6 F (36.4 C)-98.4 F (36.9 C)] 98.4 F (36.9 C) (10/31 0634) Pulse Rate:  [68-76] 68  (10/31 0634) Resp:  [16-18] 18  (10/31 0634) BP: (106-109)/(54-65) 109/65 mmHg (10/31 0634) SpO2:  [90 %-97 %] 97 % (10/31 0634)  Intake/Output from previous day:  Intake/Output Summary (Last 24 hours) at 03/26/12 0909 Last data filed at 03/26/12 0500  Gross per 24 hour  Intake   1475 ml  Output    350 ml  Net   1125 ml    Intake/Output this shift:    Labs:  Basename 03/26/12 0440 03/25/12 0630 03/24/12 0433  HGB 8.4* 9.3* 11.0*    Basename 03/26/12 0440 03/25/12 0630  WBC 9.0 15.7*  RBC 2.57* 2.87*  HCT 24.3* 27.1*  PLT 126* 143*    Basename 03/26/12 0440 03/25/12 0630  NA 140 138  K 3.3* 3.9  CL 107 106  CO2 24 21  BUN 15 12  CREATININE 0.70 0.72  GLUCOSE 107* 117*  CALCIUM 8.3* 9.1   No results found for this basename: LABPT:2,INR:2 in the last 72 hours  EXAM General - Patient is alert and the confusion is resolving.  She is better today. Extremity - Neurovascular intact Sensation intact distally Dorsiflexion/Plantar flexion intact No cellulitis present Dressing/Incision - clean, dry, no drainage, healing Motor Function - intact, moving foot and toes well on exam.   Past Medical History  Diagnosis Date  . Right ovarian cyst     MONITORED BY DR Eda Paschal  . History of kidney stones   .  Rosacea   . MVP (mitral valve prolapse) MILD  . Right ureteral stone   . Coronary artery disease     CARDIOLOGIST- DR Alanda Amass-- LAST VISIT 04-04-11 (will request note)---   (per note cardiolite 2009 negative,  echo jan.2010  . GERD (gastroesophageal reflux disease)     CONTROLLED W/ ACIPHEX AND prn prevacid  . Edema occasional in ankles  . Hypothyroidism   . Dysrhythmia     HX SVT AND PAF--- occ. palpitations  . LBBB (left bundle branch block) CHRONIC  . H/O hiatal hernia   . RA (rheumatoid arthritis)     followed by dr Kellie Simmering  . Fibroid     right ovary    Assessment/Plan: 3 Days Post-Op Procedure(s) (LRB): TOTAL KNEE ARTHROPLASTY (Right) Principal Problem:  *OA (osteoarthritis) of knee Active Problems:  Postop Acute blood loss anemia  Altered awareness, transient  Delirium  Leukocytosis  Estimated Body mass index is 22.76 kg/(m^2) as calculated from the following:   Height as of this encounter: 5' 3.386"[Documented in chart 03/18/12[(1.61 m).   Weight as of this encounter: 130 lb 1.1 oz(59 kg). Up with therapy Plan for discharge tomorrow  DVT Prophylaxis - Xarelto Weight-Bearing as tolerated to right leg  PERKINS, ALEXZANDREW 03/26/2012, 9:09 AM

## 2012-03-27 DIAGNOSIS — E876 Hypokalemia: Secondary | ICD-10-CM

## 2012-03-27 MED ORDER — POTASSIUM CHLORIDE CRYS ER 20 MEQ PO TBCR
40.0000 meq | EXTENDED_RELEASE_TABLET | Freq: Two times a day (BID) | ORAL | Status: AC
  Administered 2012-03-27 (×2): 40 meq via ORAL
  Filled 2012-03-27 (×2): qty 2

## 2012-03-27 MED ORDER — RIVAROXABAN 10 MG PO TABS: 10.0000 mg | ORAL_TABLET | Freq: Every day | ORAL | Status: DC

## 2012-03-27 MED ORDER — HYDROCODONE-ACETAMINOPHEN 5-325 MG PO TABS
1.0000 | ORAL_TABLET | Freq: Four times a day (QID) | ORAL | Status: DC | PRN
Start: 2012-03-27 — End: 2012-10-12

## 2012-03-27 MED ORDER — METHOCARBAMOL 500 MG PO TABS: 500.0000 mg | ORAL_TABLET | Freq: Three times a day (TID) | ORAL | Status: DC | PRN

## 2012-03-27 NOTE — Discharge Summary (Signed)
Physician Discharge Summary   Patient ID: Debra Richards MRN: [id number] DOB/AGE: April 13, 1926 78 y.o.  Admit date: 03/23/2012 Discharge date: 03/28/2012  Primary Diagnosis: Osteoarthritis Right knee   Admission Diagnoses:  Past Medical History  Diagnosis Date  . Right ovarian cyst     MONITORED BY DR Eda Paschal  . History of kidney stones   . Rosacea   . MVP (mitral valve prolapse) MILD  . Right ureteral stone   . Coronary artery disease     CARDIOLOGIST- DR Alanda Amass-- LAST VISIT 04-04-11 (will request note)---   (per note cardiolite 2009 negative,  echo jan.2010  . GERD (gastroesophageal reflux disease)     CONTROLLED W/ ACIPHEX AND prn prevacid  . Edema occasional in ankles  . Hypothyroidism   . Dysrhythmia     HX SVT AND PAF--- occ. palpitations  . LBBB (left bundle branch block) CHRONIC  . H/O hiatal hernia   . RA (rheumatoid arthritis)     followed by dr Kellie Simmering  . Fibroid     right ovary   Discharge Diagnoses:   Principal Problem:  *OA (osteoarthritis) of knee Active Problems:  Postop Acute blood loss anemia  Altered awareness, transient  Delirium  Leukocytosis  Postop Hypokalemia  Estimated Body mass index is 22.76 kg/(m^2) as calculated from the following:   Height as of this encounter: 5' 3.386"[Documented in chart 03/18/12[(1.61 m).   Weight as of this encounter: 130 lb 1.1 oz(59 kg).  Classification of overweight in adults according to BMI (WHO, 1998)   Procedure:  Procedure(s) (LRB): TOTAL KNEE ARTHROPLASTY (Right)   Consults: Medicine Consult - Hospitalists  HPI: Debra Richards is a 78 y.o. year old female with end stage OA of her right knee with progressively worsening pain and dysfunction. She has constant pain, with activity and at rest and significant functional deficits with difficulties even with ADLs. She has had extensive non-op management including analgesics, injections of cortisone and viscosupplements, and home exercise  program, but remains in significant pain with significant dysfunction.Radiographs show bone on bone arthritis lateral and patellofemoral compartments with valgus deformity. She presents now for right Total Knee Arthroplasty.   Laboratory Data: Admission on 03/23/2012  Component Date Value Range Status  . ABO/RH(D) 03/23/2012 A NEG   Final  . Antibody Screen 03/23/2012 NEG   Final  . Sample Expiration 03/23/2012 03/26/2012   Final  . ABO/RH(D) 03/23/2012 A NEG   Final  . WBC 03/24/2012 6.3  4.0 - 10.5 K/uL Final  . RBC 03/24/2012 3.51* 3.87 - 5.11 MIL/uL Final  . Hemoglobin 03/24/2012 11.0* 12.0 - 15.0 g/dL Final  . HCT 21/30/8657 32.8* 36.0 - 46.0 % Final  . MCV 03/24/2012 93.4  78.0 - 100.0 fL Final  . MCH 03/24/2012 31.3  26.0 - 34.0 pg Final  . MCHC 03/24/2012 33.5  30.0 - 36.0 g/dL Final  . RDW 84/69/6295 13.0  11.5 - 15.5 % Final  . Platelets 03/24/2012 131* 150 - 400 K/uL Final  . Sodium 03/24/2012 138  135 - 145 mEq/L Final  . Potassium 03/24/2012 3.9  3.5 - 5.1 mEq/L Final  . Chloride 03/24/2012 103  96 - 112 mEq/L Final  . CO2 03/24/2012 24  19 - 32 mEq/L Final  . Glucose, Bld 03/24/2012 216* 70 - 99 mg/dL Final  . BUN 28/41/3244 9  6 - 23 mg/dL Final  . Creatinine, Ser 03/24/2012 0.66  0.50 - 1.10 mg/dL Final  . Calcium 05/29/7251 8.4  8.4 - 10.5 mg/dL  Final  . GFR calc non Af Amer 03/24/2012 79* >90 mL/min Final  . GFR calc Af Amer 03/24/2012 >90  >90 mL/min Final   Comment:                                 The eGFR has been calculated                          using the CKD EPI equation.                          This calculation has not been                          validated in all clinical                          situations.                          eGFR's persistently                          <90 mL/min signify                          possible Chronic Kidney Disease.  . WBC 03/25/2012 15.7* 4.0 - 10.5 K/uL Final  . RBC 03/25/2012 2.87* 3.87 - 5.11 MIL/uL Final  .  Hemoglobin 03/25/2012 9.3* 12.0 - 15.0 g/dL Final  . HCT 47/82/9562 27.1* 36.0 - 46.0 % Final  . MCV 03/25/2012 94.4  78.0 - 100.0 fL Final  . MCH 03/25/2012 32.4  26.0 - 34.0 pg Final  . MCHC 03/25/2012 34.3  30.0 - 36.0 g/dL Final  . RDW 13/12/6576 13.5  11.5 - 15.5 % Final  . Platelets 03/25/2012 143* 150 - 400 K/uL Final  . Sodium 03/25/2012 138  135 - 145 mEq/L Final  . Potassium 03/25/2012 3.9  3.5 - 5.1 mEq/L Final  . Chloride 03/25/2012 106  96 - 112 mEq/L Final  . CO2 03/25/2012 21  19 - 32 mEq/L Final  . Glucose, Bld 03/25/2012 117* 70 - 99 mg/dL Final  . BUN 46/96/2952 12  6 - 23 mg/dL Final  . Creatinine, Ser 03/25/2012 0.72  0.50 - 1.10 mg/dL Final  . Calcium 84/13/2440 9.1  8.4 - 10.5 mg/dL Final  . GFR calc non Af Amer 03/25/2012 77* >90 mL/min Final  . GFR calc Af Amer 03/25/2012 90* >90 mL/min Final   Comment:                                 The eGFR has been calculated                          using the CKD EPI equation.                          This calculation has not been                          validated in all clinical  situations.                          eGFR's persistently                          <90 mL/min signify                          possible Chronic Kidney Disease.  . Color, Urine 03/25/2012 YELLOW  YELLOW Final  . APPearance 03/25/2012 CLOUDY* CLEAR Final  . Specific Gravity, Urine 03/25/2012 1.008  1.005 - 1.030 Final  . pH 03/25/2012 6.5  5.0 - 8.0 Final  . Glucose, UA 03/25/2012 NEGATIVE  NEGATIVE mg/dL Final  . Hgb urine dipstick 03/25/2012 LARGE* NEGATIVE Final  . Bilirubin Urine 03/25/2012 NEGATIVE  NEGATIVE Final  . Ketones, ur 03/25/2012 NEGATIVE  NEGATIVE mg/dL Final  . Protein, ur 16/02/9603 NEGATIVE  NEGATIVE mg/dL Final  . Urobilinogen, UA 03/25/2012 0.2  0.0 - 1.0 mg/dL Final  . Nitrite 54/01/8118 NEGATIVE  NEGATIVE Final  . Leukocytes, UA 03/25/2012 NEGATIVE  NEGATIVE Final  . RBC / HPF 03/25/2012 11-20   <3 RBC/hpf Final  . Bacteria, UA 03/25/2012 RARE  RARE Final  . Digoxin Level 03/25/2012 0.3* 0.8 - 2.0 ng/mL Final  . TSH 03/25/2012 0.117* 0.350 - 4.500 uIU/mL Final  . WBC 03/26/2012 9.0  4.0 - 10.5 K/uL Final  . RBC 03/26/2012 2.57* 3.87 - 5.11 MIL/uL Final  . Hemoglobin 03/26/2012 8.4* 12.0 - 15.0 g/dL Final  . HCT 14/78/2956 24.3* 36.0 - 46.0 % Final  . MCV 03/26/2012 94.6  78.0 - 100.0 fL Final  . MCH 03/26/2012 32.7  26.0 - 34.0 pg Final  . MCHC 03/26/2012 34.6  30.0 - 36.0 g/dL Final  . RDW 21/30/8657 14.1  11.5 - 15.5 % Final  . Platelets 03/26/2012 126* 150 - 400 K/uL Final  . Neutrophils Relative 03/26/2012 74  43 - 77 % Final  . Neutro Abs 03/26/2012 6.6  1.7 - 7.7 K/uL Final  . Lymphocytes Relative 03/26/2012 17  12 - 46 % Final  . Lymphs Abs 03/26/2012 1.5  0.7 - 4.0 K/uL Final  . Monocytes Relative 03/26/2012 9  3 - 12 % Final  . Monocytes Absolute 03/26/2012 0.8  0.1 - 1.0 K/uL Final  . Eosinophils Relative 03/26/2012 0  0 - 5 % Final  . Eosinophils Absolute 03/26/2012 0.0  0.0 - 0.7 K/uL Final  . Basophils Relative 03/26/2012 0  0 - 1 % Final  . Basophils Absolute 03/26/2012 0.0  0.0 - 0.1 K/uL Final  . Sodium 03/26/2012 140  135 - 145 mEq/L Final  . Potassium 03/26/2012 3.3* 3.5 - 5.1 mEq/L Final  . Chloride 03/26/2012 107  96 - 112 mEq/L Final  . CO2 03/26/2012 24  19 - 32 mEq/L Final  . Glucose, Bld 03/26/2012 107* 70 - 99 mg/dL Final  . BUN 84/69/6295 15  6 - 23 mg/dL Final  . Creatinine, Ser 03/26/2012 0.70  0.50 - 1.10 mg/dL Final  . Calcium 28/41/3244 8.3* 8.4 - 10.5 mg/dL Final  . GFR calc non Af Amer 03/26/2012 78* >90 mL/min Final  . GFR calc Af Amer 03/26/2012 >90  >90 mL/min Final   Comment:  The eGFR has been calculated                          using the CKD EPI equation.                          This calculation has not been                          validated in all clinical                          situations.                           eGFR's persistently                          <90 mL/min signify                          possible Chronic Kidney Disease.  Hospital Outpatient Visit on 03/18/2012  Component Date Value Range Status  . aPTT 03/18/2012 32  24 - 37 seconds Final  . WBC 03/18/2012 5.6  4.0 - 10.5 K/uL Final  . RBC 03/18/2012 4.30  3.87 - 5.11 MIL/uL Final  . Hemoglobin 03/18/2012 13.8  12.0 - 15.0 g/dL Final  . HCT 47/82/9562 40.5  36.0 - 46.0 % Final  . MCV 03/18/2012 94.2  78.0 - 100.0 fL Final  . MCH 03/18/2012 32.1  26.0 - 34.0 pg Final  . MCHC 03/18/2012 34.1  30.0 - 36.0 g/dL Final  . RDW 13/12/6576 13.2  11.5 - 15.5 % Final  . Platelets 03/18/2012 158  150 - 400 K/uL Final  . Sodium 03/18/2012 140  135 - 145 mEq/L Final  . Potassium 03/18/2012 4.2  3.5 - 5.1 mEq/L Final  . Chloride 03/18/2012 102  96 - 112 mEq/L Final  . CO2 03/18/2012 29  19 - 32 mEq/L Final  . Glucose, Bld 03/18/2012 89  70 - 99 mg/dL Final  . BUN 46/96/2952 16  6 - 23 mg/dL Final  . Creatinine, Ser 03/18/2012 0.78  0.50 - 1.10 mg/dL Final  . Calcium 84/13/2440 9.5  8.4 - 10.5 mg/dL Final  . Total Protein 03/18/2012 6.8  6.0 - 8.3 g/dL Final  . Albumin 03/23/2535 3.9  3.5 - 5.2 g/dL Final  . AST 64/40/3474 23  0 - 37 U/L Final  . ALT 03/18/2012 12  0 - 35 U/L Final  . Alkaline Phosphatase 03/18/2012 76  39 - 117 U/L Final  . Total Bilirubin 03/18/2012 0.4  0.3 - 1.2 mg/dL Final  . GFR calc non Af Amer 03/18/2012 75* >90 mL/min Final  . GFR calc Af Amer 03/18/2012 87* >90 mL/min Final   Comment:                                 The eGFR has been calculated                          using the CKD EPI equation.  This calculation has not been                          validated in all clinical                          situations.                          eGFR's persistently                          <90 mL/min signify                          possible Chronic Kidney Disease.  Marland Kitchen  Prothrombin Time 03/18/2012 12.3  11.6 - 15.2 seconds Final  . INR 03/18/2012 0.92  0.00 - 1.49 Final  . Color, Urine 03/18/2012 YELLOW  YELLOW Final  . APPearance 03/18/2012 CLEAR  CLEAR Final  . Specific Gravity, Urine 03/18/2012 1.009  1.005 - 1.030 Final  . pH 03/18/2012 6.5  5.0 - 8.0 Final  . Glucose, UA 03/18/2012 NEGATIVE  NEGATIVE mg/dL Final  . Hgb urine dipstick 03/18/2012 LARGE* NEGATIVE Final  . Bilirubin Urine 03/18/2012 NEGATIVE  NEGATIVE Final  . Ketones, ur 03/18/2012 NEGATIVE  NEGATIVE mg/dL Final  . Protein, ur 09/81/1914 NEGATIVE  NEGATIVE mg/dL Final  . Urobilinogen, UA 03/18/2012 0.2  0.0 - 1.0 mg/dL Final  . Nitrite 78/29/5621 NEGATIVE  NEGATIVE Final  . Leukocytes, UA 03/18/2012 TRACE* NEGATIVE Final  . MRSA, PCR 03/18/2012 NEGATIVE  NEGATIVE Final  . Staphylococcus aureus 03/18/2012 NEGATIVE  NEGATIVE Final   Comment:                                 The Xpert SA Assay (FDA                          approved for NASAL specimens                          in patients over 72 years of age),                          is one component of                          a comprehensive surveillance                          program.  Test performance has                          been validated by [employer] for patients greater                          than or equal to 56 year old.  It is not intended                          to diagnose infection nor to                          guide or monitor treatment.  . WBC, UA 03/18/2012 0-2  <3 WBC/hpf Final  . RBC / HPF 03/18/2012 7-10  <3 RBC/hpf Final  . Bacteria, UA 03/18/2012 RARE  RARE Final     X-Rays:Dg Chest 2 View  03/18/2012  *RADIOLOGY REPORT*  Clinical Data: Preop right total knee replacement  CHEST - 2 VIEW  Comparison: [facility] chest radiographs dated 05/11/2010.  Findings: Chronic interstitial markings.  Lungs are essentially clear. No pleural effusion or  pneumothorax.  Cardiomediastinal silhouette is within normal limits.  Mild degenerative changes of the visualized thoracolumbar spine.  IMPRESSION: No evidence of acute cardiopulmonary disease.   Original Report Authenticated By: Charline Bills, M.D.    Dg Chest Port 1 View  03/25/2012  *RADIOLOGY REPORT*  Clinical Data: Delirium and leukocytosis.  Weakness.  PORTABLE CHEST - 1 VIEW  Comparison: Two-view chest 03/18/2012.  Findings: The heart size is normal.  Emphysematous changes are again noted.  Biapical pleural parenchymal scarring is noted.  The visualized soft tissues and bony thorax are unremarkable.  IMPRESSION:  1.  Emphysema. 2.  No acute cardiopulmonary disease.   Original Report Authenticated By: Jamesetta Orleans. MATTERN, M.D.     EKG:No orders found for this or any previous visit.   Hospital Course:  Debra Richards is a 78 y.o. who was admitted to [facility]. They were brought to the operating room on 03/23/2012 and underwent Procedure(s): TOTAL KNEE ARTHROPLASTY.  Patient tolerated the procedure well and was later transferred to the recovery room and then to the orthopaedic floor for postoperative care.  They were given PO and IV analgesics for pain control following their surgery.  They were given 24 hours of postoperative antibiotics of Vancomycin and started on DVT prophylaxis in the form of Xarelto.   PT and OT were ordered for total joint protocol.  Discharge planning consulted to help with postop disposition and equipment needs.  Patient had a decent night on the evening of surgery but did have some pain. She started to get up OOB with therapy on day one and walked over 90 feet.  Hemovac drain was pulled without difficulty.  On the evening of POD 1, the patient became confused. She had been very confused since 7pm that evening. She was confused to place and time and situation. Combative at times and has refused to get back to bed. Daughter Almira Coaster called at 10pm and came  to be at the bedside. Patient calmer with daughter in the room and was reclined in the bed with bed alarm initiated. Order not to give tonight's bedtime dose of tranxene. Medical team consulted to see patient that night. She was seen in the early morning hours of day two by Hospitalist Houston Siren, MD. HPI: Debra Richards is an 78 y.o. female s/p total knee for OA, with no hx of dementia, was found to be intermittently confused tonight. She was a little agitated, and didn't know where she was. She had no hx of dementia. Her daughter came from home, and with her daughter at her bedside, she seems to be doing better, more calm and less confused. Daughter stated that she acted  the same way for a day with her previous knee surgery. She was given some narcotic before, but it has been more than 12 hours. I reviewed her medication list, she has been given Decadron IV post op. She also has been taking Robaxin and Ultram. She is on digitalis. When I spoke with her, she converse well, with slight confusion. She doesn't appear short of breath. She denied cough, fever, or chills. Her husband has been sick as well and was in the ER. She worried about him. She also hadn't slept much last night nor today. She usually takes tranxene 7.5mg  PRN q hs sleep. PLAN: She appears to have intermittent delirium. I suspect it is from medication and environmental change aggravated with lack of sleep. Medications include steroid, Dig, narcotics, muscle relaxant. I will check an UA, TSH, Dig level. Will continue her meds except will d/c robaxin and ultram. I will continue tranxene, so she can get some sleep. We should continue to follow her clinically. If she doesn't clear, will need imaging of her head, but she doesn't appear to have had a stroke or TIA. Thank you for the consult. We will follow with you.  POD 2 - Patient noted to be confused on rounds.  Attempted to work with therapy into day two. Deferred after discussion with RN  based on pt's level of confusion/agitation. Dressing was changed on day two and the incision was healing well.  Seen in rounds by Dr. Ashley Royalty Assessment/Plan:  Patient Active Hospital Problem List: Acute delirium:  Pt had an abrupt discontinuation of her Tranxene and could certainly be having withdrawal symptoms. She has no evidence of an acute infection in Urine. Will check CXR for any chance of occult respiratory infection in light of new Leukocytosis. This of course could be just secondary to a change in environment. Will continue to monitor.  POD 3 - Patient seen in rounds with Dr. Lequita Halt. Family at bedside. Patient better today. Improved from yesterday. She recognized Dr. Lequita Halt and the PA upon entering the room. Still in pain. Will slowly reinitiate some of the meds. Tried Toradol IV and she was on hydrocodone at home. The patient had some progression with therapy walking some that morning with PT.  Incision was healing well. The plan was to still go home but she was not ready at that time. She was seen again by Medicine. Assessment/Plan:  Patient Active Hospital Problem List: Acute delirium:  Pt had an acute delirium which is likely secondary to Tranxene withdrawal. The patient is now back to her baseline at this time I will sign off. Please call with further questions. Thank you for inviting our assistance in the care of this patient.  POD 4 - Patient seen in rounds with Dr. Lequita Halt. Family in room. She was improving from her cognitive standpoint. She was more alert and oriented that day. She was slowly progressing with therapy. The patient still wanted to go home but not ready that day. Worked with more therapy and tentatively set her up for the next day.  POD 5 - Patient seen in rounds with Dr. Darrelyn Hillock. Patient was well, and had no acute complaints or problems. No issue overnight. She reported that she did have some pain in the knee but she was improved from day before. She denied chest  pain and shortness of breath. Plan was to go Home.   Discharge Medications: Prior to Admission medications   Medication Sig Start Date End Date Taking? Authorizing Provider  atenolol (TENORMIN)  25 MG tablet Take 12.5 mg by mouth every morning.   Yes Historical Provider, MD  clorazepate (TRANXENE) 7.5 MG tablet Take 7.5 mg by mouth 2 (two) times daily. sleep   Yes Historical Provider, MD  digoxin (LANOXIN) 0.125 MG tablet Take 62.5 mcg by mouth every morning.    Yes Historical Provider, MD  doxycycline (VIBRAMYCIN) 100 MG capsule Take 100 mg by mouth daily as needed. rosacea   Yes Historical Provider, MD  levothyroxine (SYNTHROID, LEVOTHROID) 75 MCG tablet Take 75 mcg by mouth every evening.    Yes Historical Provider, MD  pantoprazole (PROTONIX) 20 MG tablet Take 20 mg by mouth every morning.    Yes Historical Provider, MD  Skin Protectants, Misc. (CALAZIME SKIN PROTECTANT EX) Apply topically.   Yes Historical Provider, MD  HYDROcodone-acetaminophen (NORCO/VICODIN) 5-325 MG per tablet Take 1 tablet by mouth every 6 (six) hours as needed. Pain 03/27/12   Alexzandrew Julien Girt, PA  methocarbamol (ROBAXIN) 500 MG tablet Take 1 tablet (500 mg total) by mouth every 8 (eight) hours as needed. 03/27/12   Alexzandrew Julien Girt, PA  rivaroxaban (XARELTO) 10 MG TABS tablet Take 1 tablet (10 mg total) by mouth daily with breakfast. Take Xarelto for two and a half more weeks, then discontinue Xarelto. Once the patient has completed the Xarelto, they may resume the 81 mg Aspirin. 03/27/12   Alexzandrew Julien Girt, PA    Diet: Cardiac diet Activity:WBAT Follow-up: Follow up with Loanne Drilling, MD. Schedule an appointment as soon as possible for a visit on 04/02/2012. (Follow up with Dr. Lequita Halt in the [facility] clinic.)  Disposition - Home Discharged Condition: stable   Discharge Orders    Future Orders Please Complete By Expires   Diet - low sodium heart healthy      Call MD / Call 911      Comments:   If you  experience chest pain or shortness of breath, CALL 911 and be transported to the hospital emergency room.  If you develope a fever above 101 F, pus (white drainage) or increased drainage or redness at the wound, or calf pain, call your surgeon's office.   Discharge instructions      Comments:   Pick up stool softner and laxative for home. Do not submerge incision under water. May shower. Continue to use ice for pain and swelling from surgery.  Take Xarelto for two and a half more weeks, then discontinue Xarelto. Once the patient has completed the Xarelto, they may resume the 81 mg Aspirin.   Constipation Prevention      Comments:   Drink plenty of fluids.  Prune juice may be helpful.  You may use a stool softener, such as Colace (over the counter) 100 mg twice a day.  Use MiraLax (over the counter) for constipation as needed.   Increase activity slowly as tolerated      Patient may shower      Comments:   You may shower without a dressing once there is no drainage.  Do not wash over the wound.  If drainage remains, do not shower until drainage stops.   Weight bearing as tolerated      Driving restrictions      Comments:   No driving until released by the physician.   Lifting restrictions      Comments:   No lifting until released by the physician.   TED hose      Comments:   Use stockings (TED hose) for 3 weeks on both leg(s).  You may remove them at night for sleeping.   Change dressing      Comments:   Change dressing daily with sterile 4 x 4 inch gauze dressing and apply TED hose. Do not submerge the incision under water.   Do not put a pillow under the knee. Place it under the heel.      Do not sit on low chairs, stoools or toilet seats, as it may be difficult to get up from low surfaces          Medication List     As of 03/27/2012  8:26 AM    STOP taking these medications         aspirin EC 81 MG tablet      calcium carbonate 600 MG Tabs   Commonly known as: OS-CAL       cholecalciferol 1000 UNITS tablet   Commonly known as: VITAMIN D      MULTIVITAMIN PO      TAKE these medications         atenolol 25 MG tablet   Commonly known as: TENORMIN   Take 12.5 mg by mouth every morning.      CALAZIME SKIN PROTECTANT EX   Apply topically.      clorazepate 7.5 MG tablet   Commonly known as: TRANXENE   Take 7.5 mg by mouth 2 (two) times daily. sleep      digoxin 0.125 MG tablet   Commonly known as: LANOXIN   Take 62.5 mcg by mouth every morning.      doxycycline 100 MG capsule   Commonly known as: VIBRAMYCIN   Take 100 mg by mouth daily as needed. rosacea      HYDROcodone-acetaminophen 5-325 MG per tablet   Commonly known as: NORCO/VICODIN   Take 1 tablet by mouth every 6 (six) hours as needed. Pain      levothyroxine 75 MCG tablet   Commonly known as: SYNTHROID, LEVOTHROID   Take 75 mcg by mouth every evening.      methocarbamol 500 MG tablet   Commonly known as: ROBAXIN   Take 1 tablet (500 mg total) by mouth every 8 (eight) hours as needed.      pantoprazole 20 MG tablet   Commonly known as: PROTONIX   Take 20 mg by mouth every morning.      rivaroxaban 10 MG Tabs tablet   Commonly known as: XARELTO   Take 1 tablet (10 mg total) by mouth daily with breakfast. Take Xarelto for two and a half more weeks, then discontinue Xarelto.  Once the patient has completed the Xarelto, they may resume the 81 mg Aspirin.           Follow-up Information    Follow up with Loanne Drilling, MD. Schedule an appointment as soon as possible for a visit on 04/02/2012. (Follow up with Dr. Lequita Halt in the [facility] clinic.)    Contact information:   [address] [place] [address] [phone]          Signed: Patrica Duel 03/27/2012, 8:26 AM

## 2012-03-27 NOTE — Progress Notes (Signed)
Occupational Therapy Treatment Patient Details Name: Debra Richards MRN: [id number] DOB: [birth year] Today's Date: 03/27/2012 Time: 6213-0865 OT Time Calculation (min): 10 min  OT Assessment / Plan / Recommendation Comments on Treatment Session Pt making steady improvement.  Improved standing balance today.  Pt distracts herself talking and needs max cues for activities    Follow Up Recommendations  Home health OT;Supervision/Assistance - 24 hour    Barriers to Discharge       Equipment Recommendations  Rolling walker with 5" wheels    Recommendations for Other Services    Frequency     Plan      Precautions / Restrictions Precautions Precautions: Knee Required Braces or Orthoses: Knee Immobilizer - Right Knee Immobilizer - Right: Discontinue once straight leg raise with < 10 degree lag   Pertinent Vitals/Pain No c/o pain    ADL  Toilet Transfer: Performed;Minimal assistance (mod A for sit to stand and min cues) Toilet Transfer Method: Stand pivot Acupuncturist: Materials engineer and Hygiene: Performed;Supervision/safety Where Assessed - Engineer, mining and Hygiene: Sit on 3-in-1 or toilet Transfers/Ambulation Related to ADLs: static standing for 3 minutes with min A ADL Comments: Daughter is an Charity fundraiser at [employer].  She was present and is familiar with shower transfer sequence    OT Diagnosis:    OT Problem List:   OT Treatment Interventions:     OT Goals ADL Goals Pt Will Transfer to Toilet: with min assist;3-in-1;Ambulation ADL Goal: Toilet Transfer - Progress: Progressing toward goals Pt Will Perform Toileting - Hygiene: with min assist;Sit to stand from 3-in-1/toilet ADL Goal: Toileting - Hygiene - Progress: Progressing toward goals Miscellaneous OT Goals Miscellaneous OT Goal #1: pt will maintain static standing balance for adls with min guard assist for 3 minutes OT Goal: Miscellaneous Goal #1 -  Progress: Met  Visit Information  Last OT Received On: 03/27/12 Assistance Needed: +1 (spt)    Subjective Data      Prior Functioning       Cognition  Overall Cognitive Status: Impaired Area of Impairment: Safety/judgement;Attention Arousal/Alertness: Awake/alert Orientation Level: Appears intact for tasks assessed Behavior During Session: [facility] for tasks performed Current Attention Level:  (distracts self talking)    Mobility  Shoulder Instructions Bed Mobility Sit to Supine: 4: Min assist Transfers Sit to Stand: 3: Mod assist       Exercises      Balance Static Standing Balance Static Standing - Balance Support: Bilateral upper extremity supported Static Standing - Level of Assistance: 4: Min assist Static Standing - Comment/# of Minutes: 3   End of Session OT - End of Session Activity Tolerance: Patient tolerated treatment well Patient left: in chair;with call bell/phone within reach;with family/visitor present  GO     Tailer Volkert 03/27/2012, 10:12 AM Marica Otter, OTR/L [phone] 03/27/2012

## 2012-03-27 NOTE — Progress Notes (Signed)
Physical Therapy Treatment Patient Details Name: Debra Richards MRN: [id number] DOB: 08-20-1925 Today's Date: 03/27/2012 Time: 4098-1191 PT Time Calculation (min): 32 min  PT Assessment / Plan / Recommendation Comments on Treatment Session  POD #4 pm session.  Assisted pt OOB to amb to BR to void then amb in hallway.  Pt becoming more clear but not yet 100% as she demon impaired safety cognition. Pt plans to D/C to home tomorrow.    Follow Up Recommendations  Home health PT     Does the patient have the potential to tolerate intense rehabilitation     Barriers to Discharge        Equipment Recommendations       Recommendations for Other Services    Frequency 7X/week   Plan Discharge plan remains appropriate    Precautions / Restrictions Precautions Precautions: Knee Precaution Comments: instructed pt on KI use and when to D/C Required Braces or Orthoses: Knee Immobilizer - Right Knee Immobilizer - Right: Discontinue once straight leg raise with < 10 degree lag Restrictions Weight Bearing Restrictions: No Other Position/Activity Restrictions: WBAT    Pertinent Vitals/Pain C/o 6/10 with amb    Mobility  Bed Mobility Bed Mobility: Supine to Sit;Sit to Supine Supine to Sit: 4: Min guard Sitting - Scoot to Edge of Bed: 4: Min assist Sit to Supine: 4: Min guard Details for Bed Mobility Assistance: increased time  Transfers Transfers: Sit to Stand;Stand to Sit Sit to Stand: 4: Min assist;From bed;From toilet Stand to Sit: 4: Min assist;To toilet;To bed Details for Transfer Assistance: 25% VC's on proper tech and hand safety with turns.  Ambulation/Gait Ambulation/Gait Assistance: 4: Min guard Ambulation Distance (Feet): 85 Feet Assistive device: Rolling walker Ambulation/Gait Assistance Details: 50% VC's on sequencing and increased time with safety iassues in the abthroom not to reach outside of her BOS to wash hands.  Directed pt on completing turns while  staying inside of the RW at all times.  Gait Pattern: Step-to pattern;Decreased stance time - right;Trunk flexed Gait velocity: deceased        PT Goals                                                               progressing    Visit Information  Last PT Received On: 03/27/12 Assistance Needed: +1    Subjective Data  Subjective: I'm going home tomorrow   Cognition       Balance     End of Session PT - End of Session Equipment Utilized During Treatment: Gait belt Activity Tolerance: Patient tolerated treatment well Patient left: in bed;with call bell/phone within reach Nurse Communication: Mobility status  Felecia Shelling  PTA [facility]  Acute  Rehab Pager     [phone]

## 2012-03-27 NOTE — Progress Notes (Signed)
   Subjective: 4 Days Post-Op Procedure(s) (LRB): TOTAL KNEE ARTHROPLASTY (Right) Patient reports pain as mild.   Patient seen in rounds with Dr. Lequita Halt. Family in room.  She is slowly improving from her cognitive standpoint.  She is more alert and oriented today.  She is slowly progressing with therapy. The patient still wants to go home but not ready today.  Work with mor therapy today and probably tomorrow. Patient is well, and has had no acute complaints or problems Plan is to go Home after hospital stay.  Objective: Vital signs in last 24 hours: Temp:  [98.2 F (36.8 C)-98.7 F (37.1 C)] 98.7 F (37.1 C) (11/01 0510) Pulse Rate:  [69-78] 78  (11/01 0510) Resp:  [16-18] 16  (11/01 0510) BP: (102-112)/(56-68) 112/64 mmHg (11/01 0510) SpO2:  [95 %-98 %] 98 % (11/01 0510)  Intake/Output from previous day:  Intake/Output Summary (Last 24 hours) at 03/27/12 0817 Last data filed at 03/27/12 0015  Gross per 24 hour  Intake    360 ml  Output     10 ml  Net    350 ml    Intake/Output this shift:    Labs:  Basename 03/26/12 0440 03/25/12 0630  HGB 8.4* 9.3*    Basename 03/26/12 0440 03/25/12 0630  WBC 9.0 15.7*  RBC 2.57* 2.87*  HCT 24.3* 27.1*  PLT 126* 143*    Basename 03/26/12 0440 03/25/12 0630  NA 140 138  K 3.3* 3.9  CL 107 106  CO2 24 21  BUN 15 12  CREATININE 0.70 0.72  GLUCOSE 107* 117*  CALCIUM 8.3* 9.1   No results found for this basename: LABPT:2,INR:2 in the last 72 hours  EXAM General - Patient is Alert, Appropriate and Oriented Extremity - Neurovascular intact Sensation intact distally Dorsiflexion/Plantar flexion intact No cellulitis present Dressing/Incision - clean, dry, no drainage, healing Motor Function - intact, moving foot and toes well on exam.   Past Medical History  Diagnosis Date  . Right ovarian cyst     MONITORED BY DR Eda Paschal  . History of kidney stones   . Rosacea   . MVP (mitral valve prolapse) MILD  . Right  ureteral stone   . Coronary artery disease     CARDIOLOGIST- DR Alanda Amass-- LAST VISIT 04-04-11 (will request note)---   (per note cardiolite 2009 negative,  echo jan.2010  . GERD (gastroesophageal reflux disease)     CONTROLLED W/ ACIPHEX AND prn prevacid  . Edema occasional in ankles  . Hypothyroidism   . Dysrhythmia     HX SVT AND PAF--- occ. palpitations  . LBBB (left bundle branch block) CHRONIC  . H/O hiatal hernia   . RA (rheumatoid arthritis)     followed by dr Kellie Simmering  . Fibroid     right ovary    Assessment/Plan: 4 Days Post-Op Procedure(s) (LRB): TOTAL KNEE ARTHROPLASTY (Right) Principal Problem:  *OA (osteoarthritis) of knee Active Problems:  Postop Acute blood loss anemia  Altered awareness, transient  Delirium  Leukocytosis  Postop Hypokalemia  Estimated Body mass index is 22.76 kg/(m^2) as calculated from the following:   Height as of this encounter: 5' 3.386"[Documented in chart 03/18/12[(1.61 m).   Weight as of this encounter: 130 lb 1.1 oz(59 kg). Up with therapy Plan for discharge tomorrow Discharge home with home health  DVT Prophylaxis - Xarelto, Aspirin 81 mg on hold for now Weight-Bearing as tolerated to right leg  PERKINS, ALEXZANDREW 03/27/2012, 8:17 AM

## 2012-03-27 NOTE — Progress Notes (Signed)
Physical Therapy Treatment Patient Details Name: Debra Richards MRN: [id number] DOB: 11-Dec-1925 Today's Date: 03/27/2012 Time: 3244-0102 PT Time Calculation (min): 35 min  PT Assessment / Plan / Recommendation Comments on Treatment Session  POD #4 am session.  Inprovement in activity and alertness but still demon some cognitive safety impairments requiring above average VC's and durection.     Follow Up Recommendations  Home health PT     Does the patient have the potential to tolerate intense rehabilitation     Barriers to Discharge        Equipment Recommendations       Recommendations for Other Services    Frequency 7X/week   Plan Discharge plan remains appropriate    Precautions / Restrictions Precautions Precautions: Knee Precaution Comments: instructed pt on KI use and when to D/C Required Braces or Orthoses: Knee Immobilizer - Right Knee Immobilizer - Right: Discontinue once straight leg raise with < 10 degree lag Restrictions Weight Bearing Restrictions: No Other Position/Activity Restrictions: WBAT    Pertinent Vitals/Pain C/o 5/10 during TE's ICE applied    Mobility  Bed Mobility Bed Mobility: Supine to Sit;Sitting - Scoot to Edge of Bed Supine to Sit: 4: Min assist;3: Mod assist Sitting - Scoot to Edge of Bed: 4: Min assist Details for Bed Mobility Assistance: increased time and min assist to support R LE off bed  Transfers Transfers: Sit to Stand;Stand to Sit Sit to Stand: 3: Mod assist;4: Min assist;From bed Stand to Sit: 4: Min assist;3: Mod assist;To chair/3-in-1 Details for Transfer Assistance: 50% VC's on proper tech and hand placement to incease safety  Ambulation/Gait Ambulation/Gait Assistance: 4: Min assist Ambulation Distance (Feet): 80 Feet Assistive device: Rolling walker Ambulation/Gait Assistance Details: 75% VC's on proper sequencing and proper walker to self distance.  Pt still demon some cognitive impairment and requires  increased cueing for safety. Gait Pattern: Step-to pattern;Decreased stance time - right;Trunk flexed Gait velocity: decreased    Exercises Total Joint Exercises Ankle Circles/Pumps: AROM;Both;10 reps;Supine Quad Sets: AROM;Both;10 reps;Supine Gluteal Sets: AROM;Both;10 reps;Supine Towel Squeeze: AROM;Both;10 reps;Supine Short Arc Quad: AAROM;Right;10 reps;Supine Heel Slides: AAROM;Right;10 reps;Supine Hip ABduction/ADduction: AAROM;Right;10 reps;Supine Straight Leg Raises: AAROM;Right;10 reps;Supine    PT Goals                                                            progressing    Visit Information  Last PT Received On: 03/27/12 Assistance Needed: +1    Subjective Data      Cognition       Balance     End of Session PT - End of Session Equipment Utilized During Treatment: Gait belt Activity Tolerance: Patient tolerated treatment well Patient left: in chair;with call bell/phone within reach (ICE to R knee) Nurse Communication: Mobility status   Felecia Shelling  PTA [facility]  Acute  Rehab Pager     [phone]

## 2012-03-28 NOTE — Progress Notes (Signed)
Discharged from floor via w/c, family with pt. No changes in assessment. Debra Richards  

## 2012-03-28 NOTE — Progress Notes (Signed)
Physical Therapy Treatment Patient Details Name: Debra Richards MRN: [id number] DOB: 02/04/1926 Today's Date: 03/28/2012 Time: 4098-1191 PT Time Calculation (min): 45 min  PT Assessment / Plan / Recommendation Comments on Treatment Session  Pt to d/c home today. Daughter present during session. Reviewed gait training, stair negotiatiion, and exercises. Issued handouts for ROM exercises and stair negotiation. Encourage pt/daughter to perform exercises x 1 more time today. Daughter unsure if HHPT coming out tomorrow. Instructed pt/daughter to perform ROM exercises x2 tomorrow if [facility] does not come out until Monday. No further questions/concerns from pt/daughter.     Follow Up Recommendations  Home health PT;Supervision/Assistance - 24 hour     Does the patient have the potential to tolerate intense rehabilitation     Barriers to Discharge        Equipment Recommendations  Rolling walker with 5" wheels    Recommendations for Other Services    Frequency 7X/week   Plan Discharge plan remains appropriate    Precautions / Restrictions Precautions Precautions: Knee Required Braces or Orthoses: Knee Immobilizer - Right (D/C'd KI 03/28/12-pt able to SLR) Knee Immobilizer - Right: Discontinue once straight leg raise with < 10 degree lag Restrictions Weight Bearing Restrictions: No Other Position/Activity Restrictions: WBAT   Pertinent Vitals/Pain 7/10 R knee. Meds given during session    Mobility  Bed Mobility Bed Mobility: Not assessed Details for Bed Mobility Assistance: Pt sitting EOB at start of session Transfers Transfers: Stand to Sit;Sit to Stand Sit to Stand: 4: Min guard;From elevated surface;From bed;With upper extremity assist Stand to Sit: 4: Min assist;To chair/3-in-1;With armrests;With upper extremity assist Details for Transfer Assistance: VCs safety, technque, hand placement. Assist to stabilize once standing, control descent.  Ambulation/Gait Ambulation/Gait  Assistance: 4: Min guard Ambulation Distance (Feet): 100 Feet Assistive device: Rolling walker Ambulation/Gait Assistance Details: VCs safety, posture, to increased step length/stride length.Slow gait speed. Pt fatigues fairly easily.  Gait Pattern: Decreased stride length;Decreased step length - left;Decreased step length - right;Step-to pattern;Antalgic Stairs: Yes Stairs Assistance: 4: Min assist Stairs Assistance Details (indicate cue type and reason): VCs safety, technique, sequence. Assist to maneuver RW and steady pt. Daughter present and assisted with stabilizing RW.  Stair Management Technique: Backwards;With walker Number of Stairs: 2     Exercises Total Joint Exercises Ankle Circles/Pumps: AROM;Both;10 reps;Supine Quad Sets: AROM;Right;10 reps;Supine Short Arc Quad: AROM;Right;10 reps;Supine Heel Slides: AAROM;Right;10 reps;Supine Hip ABduction/ADduction: AAROM;Right;Supine;10 reps Straight Leg Raises: AROM;Right;10 reps;Supine   PT Diagnosis:    PT Problem List:   PT Treatment Interventions:     PT Goals Acute Rehab PT Goals Pt will go Sit to Stand: with supervision PT Goal: Sit to Stand - Progress: Progressing toward goal Pt will go Stand to Sit: with supervision PT Goal: Stand to Sit - Progress: Progressing toward goal Pt will Ambulate: 51 - 150 feet;with supervision PT Goal: Ambulate - Progress: Progressing toward goal Pt will Go Up / Down Stairs: 3-5 stairs;with min assist;with least restrictive assistive device PT Goal: Up/Down Stairs - Progress: Progressing toward goal  Visit Information  Last PT Received On: 03/28/12 Assistance Needed: +1    Subjective Data  Subjective: "This is the most I've walked since I've been here." ) pt appears to still be a little confused Patient Stated Goal: Home today   Cognition  Overall Cognitive Status: Impaired Area of Impairment: Memory;Attention Arousal/Alertness: Awake/alert Orientation Level: Appears intact for tasks  assessed Behavior During Session: [facility] for tasks performed Attention - Other Comments: Easily distracted with conversation  Memory: Decreased recall of precautions Memory Deficits: Pt did not recall participating with therapy Following Commands: Follows multi-step commands with increased time Safety/Judgement: Decreased safety judgement for tasks assessed    Balance     End of Session PT - End of Session Equipment Utilized During Treatment: Gait belt;Right knee immobilizer Activity Tolerance: Patient limited by fatigue;Patient limited by pain Patient left: in chair;with call bell/phone within reach;with family/visitor present   GP     Rebeca Alert [facility] 03/28/2012, 10:43 AM [phone]

## 2012-03-28 NOTE — Progress Notes (Signed)
   Subjective: 5 Days Post-Op Procedure(s) (LRB): TOTAL KNEE ARTHROPLASTY (Right) Patient reports pain as mild.   [name]. Patient is well, and has had no acute complaints or problems. No issue overnight. She reports that she does have some pain in the knee but she is improved from yesterday. She denies chest pain and shortness of breath.  Plan is to go Home today  Objective: Vital signs in last 24 hours: Temp:  [98.2 F (36.8 C)-98.3 F (36.8 C)] 98.3 F (36.8 C) (11/02 0629) Pulse Rate:  [68-72] 69  (11/02 0629) BP: (103-121)/(65-66) 103/66 mmHg (11/02 0629) SpO2:  [100 %] 100 % (11/02 0629)  Intake/Output from previous day:  Intake/Output Summary (Last 24 hours) at 03/28/12 0856 Last data filed at 03/28/12 0852  Gross per 24 hour  Intake    760 ml  Output   2950 ml  Net  -2190 ml    Intake/Output this shift: Total I/O In: 160 [P.O.:160] Out: -   Labs:  Basename 03/26/12 0440  HGB 8.4*    Basename 03/26/12 0440  WBC 9.0  RBC 2.57*  HCT 24.3*  PLT 126*    Basename 03/26/12 0440  NA 140  K 3.3*  CL 107  CO2 24  BUN 15  CREATININE 0.70  GLUCOSE 107*  CALCIUM 8.3*    EXAM General - Patient is Alert and Oriented Extremity - Neurologically intact Intact pulses distally Dorsiflexion/Plantar flexion intact No cellulitis present Dressing/Incision - clean, dry, no drainage Motor Function - intact, moving foot and toes well on exam.   Past Medical History  Diagnosis Date  . Right ovarian cyst     MONITORED BY DR Eda Paschal  . History of kidney stones   . Rosacea   . MVP (mitral valve prolapse) MILD  . Right ureteral stone   . Coronary artery disease     CARDIOLOGIST- DR Alanda Amass-- LAST VISIT 04-04-11 (will request note)---   (per note cardiolite 2009 negative,  echo jan.2010  . GERD (gastroesophageal reflux disease)     CONTROLLED W/ ACIPHEX AND prn prevacid  . Edema occasional in ankles  . Hypothyroidism   .  Dysrhythmia     HX SVT AND PAF--- occ. palpitations  . LBBB (left bundle branch block) CHRONIC  . H/O hiatal hernia   . RA (rheumatoid arthritis)     followed by dr Kellie Simmering  . Fibroid     right ovary    Assessment/Plan: 5 Days Post-Op Procedure(s) (LRB): TOTAL KNEE ARTHROPLASTY (Right) Principal Problem:  *OA (osteoarthritis) of knee Active Problems:  Postop Acute blood loss anemia  Altered awareness, transient  Delirium  Leukocytosis  Postop Hypokalemia  Estimated Body mass index is 22.76 kg/(m^2) as calculated from the following:   Height as of this encounter: 5' 3.386"[Documented in chart 03/18/12[(1.61 m).   Weight as of this encounter: 130 lb 1.1 oz(59 kg). Advance diet Up with therapy D/C IV fluids Discharge home with home health  DVT Prophylaxis - Xarelto Weight-Bearing as tolerated to right leg  Will discharge home today  Debra Richards 03/28/2012, 8:56 AM

## 2012-05-05 ENCOUNTER — Ambulatory Visit: Payer: Medicare Other | Admitting: Gynecology

## 2012-06-23 ENCOUNTER — Ambulatory Visit (HOSPITAL_COMMUNITY)
Admission: RE | Admit: 2012-06-23 | Discharge: 2012-06-23 | Disposition: A | Discharge status: Home / Self Care | Payer: Medicare Other | Source: Ambulatory Visit | Attending: Cardiovascular Disease | Admitting: Cardiovascular Disease

## 2012-06-23 DIAGNOSIS — I059 Rheumatic mitral valve disease, unspecified: Secondary | ICD-10-CM | POA: Insufficient documentation

## 2012-06-23 DIAGNOSIS — I2510 Atherosclerotic heart disease of native coronary artery without angina pectoris: Secondary | ICD-10-CM | POA: Insufficient documentation

## 2012-06-23 NOTE — Progress Notes (Signed)
*  PRELIMINARY RESULTS* Echocardiogram 2D Echocardiogram has been performed.  Debra Richards 06/23/2012, 2:31 PM

## 2012-08-05 ENCOUNTER — Telehealth: Payer: Self-pay | Admitting: *Deleted

## 2012-08-05 DIAGNOSIS — N83201 Unspecified ovarian cyst, right side: Secondary | ICD-10-CM

## 2012-08-05 NOTE — Telephone Encounter (Signed)
I would schedule ultrasound now.

## 2012-08-05 NOTE — Telephone Encounter (Signed)
Order placed, claudia will call pt and schedule appt.

## 2012-08-05 NOTE — Telephone Encounter (Signed)
(  Dr.G patient) she was told on OV 08/07/12 to return in 6 months for f/u u/s for right ovarian cyst.pt never came for this because she had knee surgery. You will be her physician, her annual is due in august, should patient wait until OV with you first? Or schedule u/s. Please advise

## 2012-08-19 ENCOUNTER — Ambulatory Visit: Payer: Medicare Other | Admitting: Gynecology

## 2012-08-19 ENCOUNTER — Other Ambulatory Visit: Payer: Medicare Other

## 2012-08-26 ENCOUNTER — Ambulatory Visit: Payer: Self-pay | Admitting: Gynecology

## 2012-08-26 ENCOUNTER — Other Ambulatory Visit: Payer: Self-pay

## 2012-09-04 ENCOUNTER — Encounter: Payer: Self-pay | Admitting: Gynecology

## 2012-09-04 ENCOUNTER — Ambulatory Visit (INDEPENDENT_AMBULATORY_CARE_PROVIDER_SITE_OTHER): Payer: Medicare Other

## 2012-09-04 ENCOUNTER — Ambulatory Visit (INDEPENDENT_AMBULATORY_CARE_PROVIDER_SITE_OTHER): Payer: Medicare Other | Admitting: Gynecology

## 2012-09-04 DIAGNOSIS — N83209 Unspecified ovarian cyst, unspecified side: Secondary | ICD-10-CM

## 2012-09-04 DIAGNOSIS — N83201 Unspecified ovarian cyst, right side: Secondary | ICD-10-CM

## 2012-09-04 NOTE — Progress Notes (Signed)
Former patient of Dr. Eda Paschal presents for ultrasound. They have been following some cystic/solid changes in her ovaries over the past several years. They have remained stable on serial ultrasounds. She was recommended to have a follow up ultrasound by Dr. Eda Paschal. Ultrasound today shows right and left ovaries visualized with small changes. A thin-walled avascular cyst 15 mm mean and a more solid calcified area on the right ovary 11 x 9 mm. These are all stable from her prior ultrasounds. I reviewed with the patient concern for malignancy is near 0 and I think given the stability over years so following these that we can stop scanning her. She is comfortable with this. She'll follow up routinely when she is due for her annual exam.

## 2012-09-04 NOTE — Patient Instructions (Signed)
Follow up routinely when you're due for your annual exam. 

## 2012-10-12 ENCOUNTER — Encounter (HOSPITAL_COMMUNITY): Payer: Self-pay | Admitting: Emergency Medicine

## 2012-10-12 ENCOUNTER — Emergency Department (HOSPITAL_COMMUNITY): Payer: Medicare Other

## 2012-10-12 ENCOUNTER — Inpatient Hospital Stay (HOSPITAL_COMMUNITY): Payer: Medicare Other | Admitting: Certified Registered Nurse Anesthetist

## 2012-10-12 ENCOUNTER — Inpatient Hospital Stay (HOSPITAL_COMMUNITY): Payer: Medicare Other

## 2012-10-12 ENCOUNTER — Encounter (HOSPITAL_COMMUNITY): Payer: Self-pay | Admitting: Certified Registered Nurse Anesthetist

## 2012-10-12 ENCOUNTER — Encounter (HOSPITAL_COMMUNITY)
Admission: EM | Disposition: A | Discharge status: Skilled Nursing Facility | Payer: Self-pay | Source: Home / Self Care | Attending: Family Medicine

## 2012-10-12 ENCOUNTER — Inpatient Hospital Stay (HOSPITAL_COMMUNITY)
Admission: EM | Admit: 2012-10-12 | Discharge: 2012-10-15 | DRG: 470 | Disposition: A | Discharge status: Skilled Nursing Facility | Payer: Medicare Other | Attending: Family Medicine | Admitting: Family Medicine

## 2012-10-12 DIAGNOSIS — L719 Rosacea, unspecified: Secondary | ICD-10-CM | POA: Diagnosis present

## 2012-10-12 DIAGNOSIS — I447 Left bundle-branch block, unspecified: Secondary | ICD-10-CM | POA: Diagnosis present

## 2012-10-12 DIAGNOSIS — K219 Gastro-esophageal reflux disease without esophagitis: Secondary | ICD-10-CM | POA: Diagnosis present

## 2012-10-12 DIAGNOSIS — I059 Rheumatic mitral valve disease, unspecified: Secondary | ICD-10-CM | POA: Diagnosis present

## 2012-10-12 DIAGNOSIS — R1031 Right lower quadrant pain: Secondary | ICD-10-CM | POA: Diagnosis present

## 2012-10-12 DIAGNOSIS — S72033A Displaced midcervical fracture of unspecified femur, initial encounter for closed fracture: Principal | ICD-10-CM | POA: Diagnosis present

## 2012-10-12 DIAGNOSIS — F19921 Other psychoactive substance use, unspecified with intoxication with delirium: Secondary | ICD-10-CM | POA: Diagnosis not present

## 2012-10-12 DIAGNOSIS — R4182 Altered mental status, unspecified: Secondary | ICD-10-CM | POA: Diagnosis present

## 2012-10-12 DIAGNOSIS — I2510 Atherosclerotic heart disease of native coronary artery without angina pectoris: Secondary | ICD-10-CM | POA: Diagnosis present

## 2012-10-12 DIAGNOSIS — M069 Rheumatoid arthritis, unspecified: Secondary | ICD-10-CM | POA: Diagnosis present

## 2012-10-12 DIAGNOSIS — R7989 Other specified abnormal findings of blood chemistry: Secondary | ICD-10-CM | POA: Diagnosis present

## 2012-10-12 DIAGNOSIS — T50995A Adverse effect of other drugs, medicaments and biological substances, initial encounter: Secondary | ICD-10-CM | POA: Diagnosis present

## 2012-10-12 DIAGNOSIS — I4891 Unspecified atrial fibrillation: Secondary | ICD-10-CM | POA: Diagnosis present

## 2012-10-12 DIAGNOSIS — I341 Nonrheumatic mitral (valve) prolapse: Secondary | ICD-10-CM | POA: Diagnosis present

## 2012-10-12 DIAGNOSIS — W010XXA Fall on same level from slipping, tripping and stumbling without subsequent striking against object, initial encounter: Secondary | ICD-10-CM | POA: Diagnosis present

## 2012-10-12 DIAGNOSIS — Y92009 Unspecified place in unspecified non-institutional (private) residence as the place of occurrence of the external cause: Secondary | ICD-10-CM

## 2012-10-12 DIAGNOSIS — S72001A Fracture of unspecified part of neck of right femur, initial encounter for closed fracture: Secondary | ICD-10-CM

## 2012-10-12 DIAGNOSIS — S72009A Fracture of unspecified part of neck of unspecified femur, initial encounter for closed fracture: Secondary | ICD-10-CM

## 2012-10-12 DIAGNOSIS — Z87891 Personal history of nicotine dependence: Secondary | ICD-10-CM

## 2012-10-12 DIAGNOSIS — E039 Hypothyroidism, unspecified: Secondary | ICD-10-CM | POA: Diagnosis present

## 2012-10-12 DIAGNOSIS — I499 Cardiac arrhythmia, unspecified: Secondary | ICD-10-CM | POA: Diagnosis present

## 2012-10-12 HISTORY — PX: HIP ARTHROPLASTY: SHX981

## 2012-10-12 LAB — CBC WITH DIFFERENTIAL/PLATELET
Basophils Absolute: 0 10*3/uL (ref 0.0–0.1)
Basophils Absolute: 0 10*3/uL (ref 0.0–0.1)
Basophils Relative: 0 % (ref 0–1)
Basophils Relative: 0 % (ref 0–1)
Eosinophils Absolute: 0 10*3/uL (ref 0.0–0.7)
Eosinophils Absolute: 0.1 10*3/uL (ref 0.0–0.7)
Eosinophils Relative: 0 % (ref 0–5)
Eosinophils Relative: 1 % (ref 0–5)
HCT: 39.8 % (ref 36.0–46.0)
HCT: 37.1 % (ref 36.0–46.0)
Lymphocytes Relative: 7 % — ABNORMAL LOW (ref 12–46)
MCH: 32.9 pg (ref 26.0–34.0)
MCHC: 34.2 g/dL (ref 30.0–36.0)
MCHC: 34.8 g/dL (ref 30.0–36.0)
MCV: 96.4 fL (ref 78.0–100.0)
MCV: 96.1 fL (ref 78.0–100.0)
Monocytes Absolute: 0.6 10*3/uL (ref 0.1–1.0)
Monocytes Absolute: 0.6 10*3/uL (ref 0.1–1.0)
Neutro Abs: 4.4 10*3/uL (ref 1.7–7.7)
RDW: 13.2 % (ref 11.5–15.5)
RDW: 13.3 % (ref 11.5–15.5)

## 2012-10-12 LAB — URINALYSIS, ROUTINE W REFLEX MICROSCOPIC
Glucose, UA: NEGATIVE mg/dL
Ketones, ur: NEGATIVE mg/dL
Leukocytes, UA: NEGATIVE
Protein, ur: NEGATIVE mg/dL

## 2012-10-12 LAB — COMPREHENSIVE METABOLIC PANEL
ALT: 56 U/L — ABNORMAL HIGH (ref 0–35)
Alkaline Phosphatase: 72 U/L (ref 39–117)
CO2: 30 mEq/L (ref 19–32)
Chloride: 104 mEq/L (ref 96–112)
GFR calc Af Amer: 86 mL/min — ABNORMAL LOW (ref >90)
Glucose, Bld: 119 mg/dL — ABNORMAL HIGH (ref 70–99)
Potassium: 4 mEq/L (ref 3.5–5.1)
Sodium: 141 mEq/L (ref 135–145)
Total Bilirubin: 0.7 mg/dL (ref 0.3–1.2)
Total Protein: 5.7 g/dL — ABNORMAL LOW (ref 6.0–8.3)

## 2012-10-12 LAB — BASIC METABOLIC PANEL
CO2: 28 mEq/L (ref 19–32)
Calcium: 10 mg/dL (ref 8.4–10.5)
Creatinine, Ser: 0.82 mg/dL (ref 0.50–1.10)

## 2012-10-12 LAB — SURGICAL PCR SCREEN
MRSA, PCR: NEGATIVE
Staphylococcus aureus: NEGATIVE

## 2012-10-12 SURGERY — HEMIARTHROPLASTY, HIP, DIRECT ANTERIOR APPROACH, FOR FRACTURE
Anesthesia: General | Site: Hip | Laterality: Right | Wound class: Clean

## 2012-10-12 MED ORDER — ONDANSETRON HCL 4 MG/2ML IJ SOLN
4.0000 mg | Freq: Once | INTRAMUSCULAR | Status: AC
  Administered 2012-10-12: 4 mg via INTRAVENOUS
  Filled 2012-10-12: qty 2

## 2012-10-12 MED ORDER — MENTHOL 3 MG MT LOZG: 1.0000 | LOZENGE | OROMUCOSAL | Status: DC | PRN

## 2012-10-12 MED ORDER — METHOCARBAMOL 500 MG PO TABS
500.0000 mg | ORAL_TABLET | Freq: Four times a day (QID) | ORAL | Status: DC | PRN
  Administered 2012-10-13: 500 mg via ORAL
  Filled 2012-10-12: qty 1

## 2012-10-12 MED ORDER — METOCLOPRAMIDE HCL 10 MG PO TABS
5.0000 mg | ORAL_TABLET | Freq: Three times a day (TID) | ORAL | Status: DC | PRN
Start: 2012-10-12 — End: 2012-10-15

## 2012-10-12 MED ORDER — DEXAMETHASONE SODIUM PHOSPHATE 10 MG/ML IJ SOLN
INTRAMUSCULAR | Status: DC | PRN
  Administered 2012-10-12: 8 mg via INTRAVENOUS

## 2012-10-12 MED ORDER — SODIUM CHLORIDE 0.9 % IJ SOLN
INTRAMUSCULAR | Status: DC | PRN
  Administered 2012-10-12: 19:00:00

## 2012-10-12 MED ORDER — PHENOL 1.4 % MT LIQD: 1.0000 | OROMUCOSAL | Status: DC | PRN

## 2012-10-12 MED ORDER — HYDROMORPHONE HCL PF 1 MG/ML IJ SOLN
0.5000 mg | Freq: Once | INTRAMUSCULAR | Status: AC
  Administered 2012-10-12: 0.5 mg via INTRAVENOUS
  Filled 2012-10-12: qty 1

## 2012-10-12 MED ORDER — LEVOTHYROXINE SODIUM 75 MCG PO TABS
75.0000 ug | ORAL_TABLET | Freq: Every evening | ORAL | Status: DC
  Administered 2012-10-13 – 2012-10-14 (×2): 75 ug via ORAL
  Filled 2012-10-12 (×4): qty 1

## 2012-10-12 MED ORDER — ROCURONIUM BROMIDE 100 MG/10ML IV SOLN
INTRAVENOUS | Status: DC | PRN
  Administered 2012-10-12: 30 mg via INTRAVENOUS

## 2012-10-12 MED ORDER — VANCOMYCIN HCL IN DEXTROSE 1-5 GM/200ML-% IV SOLN
1000.0000 mg | Freq: Two times a day (BID) | INTRAVENOUS | Status: AC
  Administered 2012-10-13: 1000 mg via INTRAVENOUS
  Filled 2012-10-12: qty 200

## 2012-10-12 MED ORDER — EPHEDRINE SULFATE 50 MG/ML IJ SOLN
INTRAMUSCULAR | Status: DC | PRN
  Administered 2012-10-12: 5 mg via INTRAVENOUS
  Administered 2012-10-12: 10 mg via INTRAVENOUS

## 2012-10-12 MED ORDER — ENOXAPARIN SODIUM 40 MG/0.4ML ~~LOC~~ SOLN
40.0000 mg | SUBCUTANEOUS | Status: DC
  Filled 2012-10-12 (×2): qty 0.4

## 2012-10-12 MED ORDER — METOCLOPRAMIDE HCL 5 MG/ML IJ SOLN: 5.0000 mg | Freq: Three times a day (TID) | INTRAMUSCULAR | Status: DC | PRN

## 2012-10-12 MED ORDER — GLYCOPYRROLATE 0.2 MG/ML IJ SOLN
INTRAMUSCULAR | Status: DC | PRN
  Administered 2012-10-12: 0.6 mg via INTRAVENOUS
  Administered 2012-10-12: 0.2 mg via INTRAVENOUS

## 2012-10-12 MED ORDER — ATENOLOL 25 MG PO TABS
25.0000 mg | ORAL_TABLET | Freq: Every day | ORAL | Status: DC
  Administered 2012-10-12 – 2012-10-15 (×3): 25 mg via ORAL
  Filled 2012-10-12 (×4): qty 1

## 2012-10-12 MED ORDER — ACETAMINOPHEN 325 MG PO TABS: 650.0000 mg | ORAL_TABLET | Freq: Four times a day (QID) | ORAL | Status: DC | PRN

## 2012-10-12 MED ORDER — ONDANSETRON HCL 4 MG PO TABS: 4.0000 mg | ORAL_TABLET | Freq: Four times a day (QID) | ORAL | Status: DC | PRN

## 2012-10-12 MED ORDER — SODIUM CHLORIDE 0.9 % IV SOLN
INTRAVENOUS | Status: DC
  Administered 2012-10-12 (×2): via INTRAVENOUS

## 2012-10-12 MED ORDER — HYDROCODONE-ACETAMINOPHEN 5-325 MG PO TABS: 1.0000 | ORAL_TABLET | Freq: Four times a day (QID) | ORAL | Status: DC | PRN

## 2012-10-12 MED ORDER — ONDANSETRON HCL 4 MG/2ML IJ SOLN
INTRAMUSCULAR | Status: DC | PRN
  Administered 2012-10-12: 4 mg via INTRAVENOUS

## 2012-10-12 MED ORDER — DOXYCYCLINE HYCLATE 100 MG PO CAPS: 100.0000 mg | ORAL_CAPSULE | Freq: Two times a day (BID) | ORAL | Status: DC

## 2012-10-12 MED ORDER — ONDANSETRON HCL 4 MG/2ML IJ SOLN: 4.0000 mg | Freq: Four times a day (QID) | INTRAMUSCULAR | Status: DC | PRN

## 2012-10-12 MED ORDER — PROMETHAZINE HCL 25 MG/ML IJ SOLN: 6.2500 mg | INTRAMUSCULAR | Status: DC | PRN

## 2012-10-12 MED ORDER — TRAMADOL HCL 50 MG PO TABS
50.0000 mg | ORAL_TABLET | Freq: Four times a day (QID) | ORAL | Status: DC | PRN
  Administered 2012-10-13 (×2): 50 mg via ORAL
  Administered 2012-10-14 – 2012-10-15 (×2): 100 mg via ORAL
  Filled 2012-10-12: qty 2
  Filled 2012-10-12 (×2): qty 1
  Filled 2012-10-12: qty 2

## 2012-10-12 MED ORDER — DOXYCYCLINE HYCLATE 100 MG PO TABS
100.0000 mg | ORAL_TABLET | Freq: Two times a day (BID) | ORAL | Status: DC
  Administered 2012-10-12: 100 mg via ORAL
  Filled 2012-10-12 (×2): qty 1

## 2012-10-12 MED ORDER — SODIUM CHLORIDE 0.9 % IV SOLN: Freq: Once | INTRAVENOUS | Status: DC

## 2012-10-12 MED ORDER — PANTOPRAZOLE SODIUM 20 MG PO TBEC
20.0000 mg | DELAYED_RELEASE_TABLET | Freq: Every morning | ORAL | Status: DC
  Administered 2012-10-12: 20 mg via ORAL
  Filled 2012-10-12 (×3): qty 1

## 2012-10-12 MED ORDER — SODIUM CHLORIDE 0.9 % IV SOLN: INTRAVENOUS | Status: DC

## 2012-10-12 MED ORDER — DIGOXIN 0.0625 MG HALF TABLET
62.5000 ug | ORAL_TABLET | Freq: Every day | ORAL | Status: DC
  Administered 2012-10-14 – 2012-10-15 (×2): 62.5 ug via ORAL
  Filled 2012-10-12 (×3): qty 1

## 2012-10-12 MED ORDER — BUPIVACAINE HCL (PF) 0.25 % IJ SOLN
INTRAMUSCULAR | Status: DC | PRN
  Administered 2012-10-12: 30 mL

## 2012-10-12 MED ORDER — LACTATED RINGERS IV SOLN
INTRAVENOUS | Status: DC
  Administered 2012-10-12: 17:00:00 via INTRAVENOUS

## 2012-10-12 MED ORDER — METHOCARBAMOL 100 MG/ML IJ SOLN: 500.0000 mg | Freq: Four times a day (QID) | INTRAMUSCULAR | Status: DC | PRN

## 2012-10-12 MED ORDER — SODIUM CHLORIDE 0.9 % IV SOLN
INTRAVENOUS | Status: DC
  Administered 2012-10-12: 22:00:00 via INTRAVENOUS

## 2012-10-12 MED ORDER — SODIUM CHLORIDE 0.9 % IV SOLN
Freq: Once | INTRAVENOUS | Status: AC
  Administered 2012-10-12: 02:00:00 via INTRAVENOUS

## 2012-10-12 MED ORDER — SUCCINYLCHOLINE CHLORIDE 20 MG/ML IJ SOLN
INTRAMUSCULAR | Status: DC | PRN
  Administered 2012-10-12: 100 mg via INTRAVENOUS

## 2012-10-12 MED ORDER — MORPHINE SULFATE 2 MG/ML IJ SOLN: 0.5000 mg | INTRAMUSCULAR | Status: DC | PRN

## 2012-10-12 MED ORDER — CEFAZOLIN SODIUM-DEXTROSE 2-3 GM-% IV SOLR
INTRAVENOUS | Status: DC | PRN
  Administered 2012-10-12: 2 g via INTRAVENOUS

## 2012-10-12 MED ORDER — CLORAZEPATE DIPOTASSIUM 7.5 MG PO TABS
7.5000 mg | ORAL_TABLET | Freq: Two times a day (BID) | ORAL | Status: DC | PRN
  Administered 2012-10-13: 7.5 mg via ORAL
  Filled 2012-10-12: qty 1

## 2012-10-12 MED ORDER — LIDOCAINE HCL (CARDIAC) 20 MG/ML IV SOLN
INTRAVENOUS | Status: DC | PRN
  Administered 2012-10-12: 100 mg via INTRAVENOUS

## 2012-10-12 MED ORDER — ACETAMINOPHEN 650 MG RE SUPP: 650.0000 mg | Freq: Four times a day (QID) | RECTAL | Status: DC | PRN

## 2012-10-12 MED ORDER — NEOSTIGMINE METHYLSULFATE 1 MG/ML IJ SOLN
INTRAMUSCULAR | Status: DC | PRN
  Administered 2012-10-12: 5 mg via INTRAVENOUS

## 2012-10-12 MED ORDER — BUPIVACAINE LIPOSOME 1.3 % IJ SUSP
20.0000 mL | Freq: Once | INTRAMUSCULAR | Status: DC
  Filled 2012-10-12: qty 20

## 2012-10-12 MED ORDER — FENTANYL CITRATE 0.05 MG/ML IJ SOLN
INTRAMUSCULAR | Status: DC | PRN
  Administered 2012-10-12: 50 ug via INTRAVENOUS
  Administered 2012-10-12: 25 ug via INTRAVENOUS
  Administered 2012-10-12: 50 ug via INTRAVENOUS
  Administered 2012-10-12 (×2): 25 ug via INTRAVENOUS

## 2012-10-12 MED ORDER — ENOXAPARIN SODIUM 40 MG/0.4ML ~~LOC~~ SOLN
40.0000 mg | SUBCUTANEOUS | Status: DC
  Administered 2012-10-13 – 2012-10-15 (×3): 40 mg via SUBCUTANEOUS
  Filled 2012-10-12 (×4): qty 0.4

## 2012-10-12 MED ORDER — PROPOFOL 10 MG/ML IV BOLUS
INTRAVENOUS | Status: DC | PRN
  Administered 2012-10-12: 80 mg via INTRAVENOUS

## 2012-10-12 MED ORDER — HYDROMORPHONE HCL PF 1 MG/ML IJ SOLN: 0.2500 mg | INTRAMUSCULAR | Status: DC | PRN

## 2012-10-12 MED ORDER — KETOROLAC TROMETHAMINE 15 MG/ML IJ SOLN: 7.5000 mg | Freq: Four times a day (QID) | INTRAMUSCULAR | Status: DC | PRN

## 2012-10-12 MED ORDER — HYDROCODONE-ACETAMINOPHEN 5-325 MG PO TABS
1.0000 | ORAL_TABLET | Freq: Once | ORAL | Status: AC
  Administered 2012-10-12: 0.5 via ORAL
  Filled 2012-10-12: qty 1

## 2012-10-12 SURGICAL SUPPLY — 48 items
BAG SPEC THK2 15X12 ZIP CLS (MISCELLANEOUS) ×1
BAG ZIPLOCK 12X15 (MISCELLANEOUS) ×2 IMPLANT
BIT DRILL 2.8X128 (BIT) ×2 IMPLANT
BLADE SAW SAG 73X25 THK (BLADE) ×1
BLADE SAW SGTL 73X25 THK (BLADE) ×1 IMPLANT
BRUSH FEMORAL CANAL (MISCELLANEOUS) ×2 IMPLANT
CEMENT BONE DEPUY (Cement) ×4 IMPLANT
CEMENT RESTRICTOR DEPUY SZ 4 (Cement) ×2 IMPLANT
CLOTH BEACON ORANGE TIMEOUT ST (SAFETY) ×2 IMPLANT
DRAPE INCISE IOBAN 66X45 STRL (DRAPES) ×2 IMPLANT
DRAPE ORTHO SPLIT 77X108 STRL (DRAPES) ×2
DRAPE POUCH INSTRU U-SHP 10X18 (DRAPES) ×2 IMPLANT
DRAPE SURG ORHT 6 SPLT 77X108 (DRAPES) ×1 IMPLANT
DRAPE U-SHAPE 47X51 STRL (DRAPES) ×2 IMPLANT
DRSG EMULSION OIL 3X16 NADH (GAUZE/BANDAGES/DRESSINGS) ×2 IMPLANT
DRSG MEPILEX BORDER 4X4 (GAUZE/BANDAGES/DRESSINGS) ×2 IMPLANT
DRSG MEPILEX BORDER 4X8 (GAUZE/BANDAGES/DRESSINGS) ×2 IMPLANT
DRSG PAD ABDOMINAL 8X10 ST (GAUZE/BANDAGES/DRESSINGS) ×2 IMPLANT
DURAPREP 26ML APPLICATOR (WOUND CARE) ×2 IMPLANT
ELECT REM PT RETURN 9FT ADLT (ELECTROSURGICAL) ×2
ELECTRODE REM PT RTRN 9FT ADLT (ELECTROSURGICAL) ×1 IMPLANT
EVACUATOR 1/8 PVC DRAIN (DRAIN) ×2 IMPLANT
FACESHIELD LNG OPTICON STERILE (SAFETY) ×6 IMPLANT
GLOVE BIO SURGEON STRL SZ7.5 (GLOVE) ×2 IMPLANT
GLOVE BIO SURGEON STRL SZ8 (GLOVE) ×4 IMPLANT
GOWN STRL NON-REIN LRG LVL3 (GOWN DISPOSABLE) ×2 IMPLANT
GOWN STRL REIN XL XLG (GOWN DISPOSABLE) ×2 IMPLANT
HANDPIECE INTERPULSE COAX TIP (DISPOSABLE) ×2
IMMOBILIZER KNEE 20 (SOFTGOODS)
IMMOBILIZER KNEE 20 THIGH 36 (SOFTGOODS) IMPLANT
KIT BASIN OR (CUSTOM PROCEDURE TRAY) ×2 IMPLANT
MANIFOLD NEPTUNE II (INSTRUMENTS) ×2 IMPLANT
PACK TOTAL JOINT (CUSTOM PROCEDURE TRAY) ×2 IMPLANT
PASSER SUT SWANSON 36MM LOOP (INSTRUMENTS) ×2 IMPLANT
POSITIONER SURGICAL ARM (MISCELLANEOUS) ×2 IMPLANT
PRESSURIZER FEMORAL UNIV (MISCELLANEOUS) ×2 IMPLANT
SET HNDPC FAN SPRY TIP SCT (DISPOSABLE) ×1 IMPLANT
SPONGE GAUZE 4X4 12PLY (GAUZE/BANDAGES/DRESSINGS) ×4 IMPLANT
STRIP CLOSURE SKIN 1/2X4 (GAUZE/BANDAGES/DRESSINGS) ×2 IMPLANT
SUT ETHIBOND NAB CT1 #1 30IN (SUTURE) ×4 IMPLANT
SUT MNCRL AB 4-0 PS2 18 (SUTURE) ×2 IMPLANT
SUT VIC AB 1 CT1 27 (SUTURE) ×6
SUT VIC AB 1 CT1 27XBRD ANTBC (SUTURE) ×3 IMPLANT
SUT VIC AB 2-0 CT1 27 (SUTURE) ×6
SUT VIC AB 2-0 CT1 TAPERPNT 27 (SUTURE) ×3 IMPLANT
SUT VLOC 180 0 24IN GS25 (SUTURE) ×2 IMPLANT
TOWEL OR 17X26 10 PK STRL BLUE (TOWEL DISPOSABLE) ×4 IMPLANT
TOWER CARTRIDGE SMART MIX (DISPOSABLE) ×2 IMPLANT

## 2012-10-12 NOTE — Consult Note (Signed)
Reason for Consult: Right hip fracture Referring Physician: Dr. J. Samtani  Debra Richards is an 79 y.o. female.  HPI: Debra Richards is a 79 y.o. Female who presents with the chief complaint of right hip pain. She reports that last evening around 1800 he was getting out of a chair at home when she fell onto her right side. She recalls hitting her right hip and right knee. She denies LOC or dizziness before the fall. She reports that she was able to ambulate that evening enough to get ready for bed. She continued to have pain despite resting in her bed. She then decided to present to the ED at [place] as she could no longer ambulate due to the pain. She presented to the ED at [place] around 2130. X-rays of the right hip revealed a right subcapital femoral neck fracture. She has a history of a right total knee arthroplasty in October 2013. She reports that she has been doing well in regards to that. She does have some discomfort over her right knee where she fell on it but reports that the hip is significantly more painful. She was transported to [place] this morning to be evaluated and undergo surgical treatment for the right hip fracture by Dr. Aluisio.    Past Medical History  Diagnosis Date  . History of kidney stones   . Rosacea   . MVP (mitral valve prolapse) MILD  . Right ureteral stone   . Coronary artery disease     CARDIOLOGIST- DR WEINTRAUB-- LAST VISIT 04-04-11 (will request note)---   (per note cardiolite 2009 negative,  echo jan.2010  . GERD (gastroesophageal reflux disease)     CONTROLLED W/ ACIPHEX AND prn prevacid  . Edema occasional in ankles  . Hypothyroidism   . Dysrhythmia     HX SVT AND PAF--- occ. palpitations  . LBBB (left bundle branch block) CHRONIC  . H/O hiatal hernia   . RA (rheumatoid arthritis)     followed by dr truslow  . Atrial fibrillation     Past Surgical History  Procedure Laterality Date  . Moles excised  2012    on nose    . Extracorporeal shock wave lithotripsy  09-24-10    and 1990  . Cataract extraction w/ intraocular lens  implant, bilateral  4 yrs ago    both eyes  . Knee arthroscopy  04/17/2011, right    Procedure: ARTHROSCOPY KNEE;  Surgeon: Frank V Aluisio;  Location: [facility];  Service: Orthopedics;  Laterality: Right;  WITH LATERAL MENISCAL DEBRIDEMENT  . Vaginal hysterectomy  1970  . Cholecystectomy  2005  . Total knee arthroplasty  03/23/2012    Procedure: TOTAL KNEE ARTHROPLASTY;  Surgeon: Frank V Aluisio, MD;  Location: [facility];  Service: Orthopedics;  Laterality: Right;    Family History  Problem Relation Age of Onset  . Cancer Mother     COLON  . Heart disease Father     STROKE  . Stroke Father   . Ovarian cancer Maternal Grandmother     Social History:  reports that she quit smoking about 33 years ago. Her smoking use included Cigarettes. She has a 2.5 pack-year smoking history. She has never used smokeless tobacco. She reports that she does not drink alcohol or use illicit drugs.  Allergies:  Allergies  Allergen Reactions  . Penicillins Hives    Medications: I have reviewed the patient's current medications.  Results for orders placed during the hospital   encounter of 10/12/12 (from the past 48 hour(s))  CBC WITH DIFFERENTIAL     Status: Abnormal   Collection Time    10/12/12  2:00 AM      Result Value Range   WBC 9.2  4.0 - 10.5 K/uL   RBC 4.13  3.87 - 5.11 MIL/uL   Hemoglobin 13.6  12.0 - 15.0 g/dL   HCT 39.8  36.0 - 46.0 %   MCV 96.4  78.0 - 100.0 fL   MCH 32.9  26.0 - 34.0 pg   MCHC 34.2  30.0 - 36.0 g/dL   RDW 13.2  11.5 - 15.5 %   Platelets 134 (*) 150 - 400 K/uL   Neutrophils Relative % 86 (*) 43 - 77 %   Neutro Abs 7.8 (*) 1.7 - 7.7 K/uL   Lymphocytes Relative 7 (*) 12 - 46 %   Lymphs Abs 0.7  0.7 - 4.0 K/uL   Monocytes Relative 7  3 - 12 %   Monocytes Absolute 0.6  0.1 - 1.0 K/uL   Eosinophils Relative 0  0 - 5 %   Eosinophils Absolute  0.0  0.0 - 0.7 K/uL   Basophils Relative 0  0 - 1 %   Basophils Absolute 0.0  0.0 - 0.1 K/uL  BASIC METABOLIC PANEL     Status: Abnormal   Collection Time    10/12/12  2:00 AM      Result Value Range   Sodium 139  135 - 145 mEq/L   Potassium 4.0  3.5 - 5.1 mEq/L   Chloride 102  96 - 112 mEq/L   CO2 28  19 - 32 mEq/L   Glucose, Bld 123 (*) 70 - 99 mg/dL   BUN 17  6 - 23 mg/dL   Creatinine, Ser 0.82  0.50 - 1.10 mg/dL   Calcium 10.0  8.4 - 10.5 mg/dL   GFR calc non Af Amer 64 (*) >90 mL/min   GFR calc Af Amer 74 (*) >90 mL/min   Comment:            The eGFR has been calculated     using the CKD EPI equation.     This calculation has not been     validated in all clinical     situations.     eGFR's persistently     <90 mL/min signify     possible Chronic Kidney Disease.  URINALYSIS, ROUTINE W REFLEX MICROSCOPIC     Status: None   Collection Time    10/12/12  2:22 AM      Result Value Range   Color, Urine YELLOW  YELLOW   APPearance CLEAR  CLEAR   Specific Gravity, Urine 1.015  1.005 - 1.030   pH 7.5  5.0 - 8.0   Glucose, UA NEGATIVE  NEGATIVE mg/dL   Hgb urine dipstick NEGATIVE  NEGATIVE   Bilirubin Urine NEGATIVE  NEGATIVE   Ketones, ur NEGATIVE  NEGATIVE mg/dL   Protein, ur NEGATIVE  NEGATIVE mg/dL   Urobilinogen, UA 0.2  0.0 - 1.0 mg/dL   Nitrite NEGATIVE  NEGATIVE   Leukocytes, UA NEGATIVE  NEGATIVE   Comment: MICROSCOPIC NOT DONE ON URINES WITH NEGATIVE PROTEIN, BLOOD, LEUKOCYTES, NITRITE, OR GLUCOSE <1000 mg/dL.  CBC WITH DIFFERENTIAL     Status: Abnormal   Collection Time    10/12/12 10:00 AM      Result Value Range   WBC 5.9  4.0 - 10.5 K/uL   RBC 3.86 (*) 3.87 -   5.11 MIL/uL   Hemoglobin 12.9  12.0 - 15.0 g/dL   HCT 37.1  36.0 - 46.0 %   MCV 96.1  78.0 - 100.0 fL   MCH 33.4  26.0 - 34.0 pg   MCHC 34.8  30.0 - 36.0 g/dL   RDW 13.3  11.5 - 15.5 %   Platelets 122 (*) 150 - 400 K/uL   Neutrophils Relative % 74  43 - 77 %   Neutro Abs 4.4  1.7 - 7.7 K/uL    Lymphocytes Relative 15  12 - 46 %   Lymphs Abs 0.9  0.7 - 4.0 K/uL   Monocytes Relative 10  3 - 12 %   Monocytes Absolute 0.6  0.1 - 1.0 K/uL   Eosinophils Relative 1  0 - 5 %   Eosinophils Absolute 0.1  0.0 - 0.7 K/uL   Basophils Relative 0  0 - 1 %   Basophils Absolute 0.0  0.0 - 0.1 K/uL  COMPREHENSIVE METABOLIC PANEL     Status: Abnormal   Collection Time    10/12/12 10:00 AM      Result Value Range   Sodium 141  135 - 145 mEq/L   Potassium 4.0  3.5 - 5.1 mEq/L   Chloride 104  96 - 112 mEq/L   CO2 30  19 - 32 mEq/L   Glucose, Bld 119 (*) 70 - 99 mg/dL   BUN 16  6 - 23 mg/dL   Creatinine, Ser 0.79  0.50 - 1.10 mg/dL   Calcium 9.3  8.4 - 10.5 mg/dL   Total Protein 5.7 (*) 6.0 - 8.3 g/dL   Albumin 3.3 (*) 3.5 - 5.2 g/dL   AST 125 (*) 0 - 37 U/L   ALT 56 (*) 0 - 35 U/L   Alkaline Phosphatase 72  39 - 117 U/L   Total Bilirubin 0.7  0.3 - 1.2 mg/dL   GFR calc non Af Amer 74 (*) >90 mL/min   GFR calc Af Amer 86 (*) >90 mL/min   Comment:            The eGFR has been calculated     using the CKD EPI equation.     This calculation has not been     validated in all clinical     situations.     eGFR's persistently     <90 mL/min signify     possible Chronic Kidney Disease.  TYPE AND SCREEN     Status: None   Collection Time    10/12/12 10:00 AM      Result Value Range   ABO/RH(D) A NEG     Antibody Screen NEG     Sample Expiration 10/15/2012      Dg Hip Complete Right  10/12/2012   *RADIOLOGY REPORT*  Clinical Data: Fall.  Right hip injury and pain.  RIGHT HIP - COMPLETE 2+ VIEW  Comparison: None.  Findings: A subcapital right femoral neck fracture is seen with varus angulation.  No evidence of dislocation.  No pelvic fracture identified.  IMPRESSION: Subcapital right femoral neck fracture, with varus angulation.   Original Report Authenticated By: John Stahl, M.D.   Dg Chest Portable 1 View  10/12/2012   *RADIOLOGY REPORT*  Clinical Data: Fell. Right hip fracture.   PORTABLE CHEST - 1 VIEW  Comparison: 03/25/2012.  Findings: The cardiac silhouette, mediastinal and hilar contours are normal and stable.  There are chronic lung changes with apical scarring.  No acute pulmonary findings.    No pleural effusion.  The bony thorax is intact.  IMPRESSION: Chronic lung changes but no acute pulmonary findings.   Original Report Authenticated By: P. Gallerani, M.D.    Review of Systems  Constitutional: Positive for malaise/fatigue. Negative for fever, chills, weight loss and diaphoresis.  HENT: Negative.  Negative for neck pain.   Eyes: Negative.   Respiratory: Negative.   Cardiovascular: Negative.   Gastrointestinal: Negative.   Genitourinary: Negative.   Musculoskeletal: Positive for joint pain and falls. Negative for myalgias and back pain.       Right hip pain  Skin: Negative.   Neurological: Negative.  Negative for weakness.  Endo/Heme/Allergies: Negative.   Psychiatric/Behavioral: Negative.    Blood pressure 105/62, pulse 57, temperature 97.8 F (36.6 C), temperature source Oral, resp. rate 16, height 5' (1.524 m), weight 58.06 kg (128 lb), SpO2 98.00%. Physical Exam  Constitutional: She is oriented to person, place, and time. She appears well-developed and well-nourished. No distress.  HENT:  Head: Normocephalic and atraumatic.  Right Ear: External ear normal.  Left Ear: External ear normal.  Nose: Nose normal.  Mouth/Throat: Oropharynx is clear and moist.  Eyes: Conjunctivae and EOM are normal. Pupils are equal, round, and reactive to light.  Neck: Normal range of motion. No tracheal deviation present. No thyromegaly present.  Cardiovascular: Normal rate, regular rhythm, normal heart sounds and intact distal pulses.   No murmur heard. Respiratory: Effort normal and breath sounds normal. No respiratory distress. She has no wheezes. She exhibits no tenderness.  GI: Soft. Bowel sounds are normal. She exhibits no distension and no mass. There is no  tenderness.  Musculoskeletal:       Right hip: She exhibits decreased range of motion, decreased strength and deformity.       Left hip: Normal.       Right knee: She exhibits normal range of motion, no swelling, no effusion and no erythema. Tenderness found.       Left knee: Normal.       Right lower leg: She exhibits no tenderness and no swelling.       Left lower leg: She exhibits no tenderness and no swelling.       Legs: Lymphadenopathy:    She has no cervical adenopathy.  Neurological: She is alert and oriented to person, place, and time. She has normal strength. No sensory deficit.  Skin: No rash noted. She is not diaphoretic. No erythema.  Psychiatric: She has a normal mood and affect. Her behavior is normal.    Assessment/Plan: The patient has a right subcapital femoral neck fracture. Dr. Aluisio reviewed her films and reports that she will require a right hip hemiarthroplasty. She will be kept NPO and transported to the OR this evening for surgery. Will update labs prior to surgery and obtain a type and screen. Maintain foley catheter.   Isobella Ascher LAUREN 10/12/2012, 11:05 AM      

## 2012-10-12 NOTE — Progress Notes (Signed)
Utilization review completed.  

## 2012-10-12 NOTE — Anesthesia Preprocedure Evaluation (Addendum)
Anesthesia Evaluation  Patient identified by MRN, date of birth, ID band Patient awake    Reviewed: Allergy & Precautions, H&P , NPO status , Patient's Chart, lab work & pertinent test results  Airway Mallampati: II TM Distance: >3 FB Neck ROM: Full    Dental no notable dental hx.    Pulmonary neg pulmonary ROS,  breath sounds clear to auscultation  Pulmonary exam normal       Cardiovascular + dysrhythmias Supra Ventricular Tachycardia Rhythm:Regular Rate:Normal     Neuro/Psych negative neurological ROS  negative psych ROS   GI/Hepatic Neg liver ROS, GERD-  Medicated and Controlled,  Endo/Other  Hypothyroidism   Renal/GU negative Renal ROS  negative genitourinary   Musculoskeletal  (+) Arthritis -, Rheumatoid disorders,    Abdominal   Peds negative pediatric ROS (+)  Hematology negative hematology ROS (+)   Anesthesia Other Findings   Reproductive/Obstetrics negative OB ROS                           Anesthesia Physical Anesthesia Plan  ASA: III  Anesthesia Plan: General   Post-op Pain Management:    Induction: Intravenous  Airway Management Planned: Oral ETT  Additional Equipment:   Intra-op Plan:   Post-operative Plan: Extubation in OR  Informed Consent: I have reviewed the patients History and Physical, chart, labs and discussed the procedure including the risks, benefits and alternatives for the proposed anesthesia with the patient or authorized representative who has indicated his/her understanding and acceptance.   Dental advisory given  Plan Discussed with: CRNA and Surgeon  Anesthesia Plan Comments: (No midazolam, desflurane maintenance vapor)       Anesthesia Quick Evaluation

## 2012-10-12 NOTE — Interval H&P Note (Signed)
History and Physical Interval Note:  10/12/2012 5:15 PM  Debra Richards  has presented today for surgery, with the diagnosis of fractured right hip  The various methods of treatment have been discussed with the patient and family. After consideration of risks, benefits and other options for treatment, the patient has consented to  Procedure(s): ARTHROPLASTY BIPOLAR HIP (Right) as a surgical intervention .  The patient's history has been reviewed, patient examined, no change in status, stable for surgery.  I have reviewed the patient's chart and labs.  Questions were answered to the patient's satisfaction.     Loanne Drilling

## 2012-10-12 NOTE — Progress Notes (Signed)
[facility] History and Physical  AVO SCHLACHTER UEA:[id number] DOB: 05/07/1926 DOA: 10/12/2012  Referring physician: Kris Mouton PCP: Ignatius Specking., MD  Specialists: Despina Hick  Chief Complaint: Hip Fracture  HPI: Debra Richards is a 79 y.o. female with multiple medical conditions who was gettign out of the chair at about 6:00 pm on 5/17.  SHe was getting up and had an accidentla fall.  No lightheadedness or syncope..  She thinks she took about 3 steps and then fell.  Her husband tried to pick himself up and also fell  SHe isn;t sure if her recently operated knee gave away.  She walked around allegedly after that and took a nap and when she awoke and was in a lot more pain and couldn;t get up. She called her daughter and told her about the pain-eventuiallty decided at about 21:30 to seek medical attention.  She underwent recent surgery on the R knee in October without significant event.  Review of Systems: The patient denies CP./SOB/Bklurred vision-just sleepy.  No pounding in the heart.  She just saw Cardiologist Dr. Alanda Amass and was given the okay.  Past Medical History  Diagnosis Date  . History of kidney stones   . Rosacea   . MVP (mitral valve prolapse) MILD  . Right ureteral stone   . Coronary artery disease     CARDIOLOGIST- DR Alanda Amass-- LAST VISIT 04-04-11 (will request note)---   (per note cardiolite 2009 negative,  echo jan.2010  . GERD (gastroesophageal reflux disease)     CONTROLLED W/ ACIPHEX AND prn prevacid  . Edema occasional in ankles  . Hypothyroidism   . Dysrhythmia     HX SVT AND PAF--- occ. palpitations  . LBBB (left bundle branch block) CHRONIC  . H/O hiatal hernia   . RA (rheumatoid arthritis)     followed by dr Kellie Simmering  . Atrial fibrillation    Admission 03/23/12 for R knee pain and noted to have tranxeen withdrawal Seen by Dr. Marina Goodell for RLQ abdominal pain with h/o malignant neoplasm-difficult exame noted at that time H/o SVT and  PAF  Past Surgical History  Procedure Laterality Date  . Moles excised  2012    on nose  . Extracorporeal shock wave lithotripsy  09-24-10    and 1990  . Cataract extraction w/ intraocular lens  implant, bilateral  4 yrs ago    both eyes  . Knee arthroscopy  04/17/2011, right    Procedure: ARTHROSCOPY KNEE;  Surgeon: Gus Rankin Aluisio;  Location: [facility];  Service: Orthopedics;  Laterality: Right;  WITH LATERAL MENISCAL DEBRIDEMENT  . Vaginal hysterectomy  1970  . Cholecystectomy  2005  . Total knee arthroplasty  03/23/2012    Procedure: TOTAL KNEE ARTHROPLASTY;  Surgeon: Loanne Drilling, MD;  Location: [facility];  Service: Orthopedics;  Laterality: Right;   Social History:  reports that she quit smoking about 33 years ago. Her smoking use included Cigarettes. She has a 2.5 pack-year smoking history. She has never used smokeless tobacco. She reports that she does not drink alcohol or use illicit drugs.  Allergies  Allergen Reactions  . Penicillins Hives   Family History  Problem Relation Age of Onset  . Cancer Mother     COLON  . Heart disease Father     STROKE  . Stroke Father   . Ovarian cancer Maternal Grandmother     Prior to Admission medications   Medication Sig Start Date End Date Taking? Authorizing Provider  Atenolol (TENORMIN PO)  Take 12.5 mg by mouth daily.    Yes Historical Provider, MD  clorazepate (TRANXENE) 7.5 MG tablet Take 7.5 mg by mouth 2 (two) times daily as needed. sleep   Yes Historical Provider, MD  digoxin (LANOXIN) 0.125 MG tablet Take 62.5 mcg by mouth every morning.    Yes Historical Provider, MD  doxycycline (VIBRAMYCIN) 100 MG capsule Take 100 mg by mouth daily as needed. rosacea   Yes Historical Provider, MD  HYDROcodone-acetaminophen (VICODIN) 5-500 MG per tablet Take 1 tablet by mouth 2 (two) times daily as needed for pain.   Yes Historical Provider, MD  levothyroxine (SYNTHROID, LEVOTHROID) 75 MCG tablet Take 75 mcg by mouth  every evening.    Yes Historical Provider, MD  pantoprazole (PROTONIX) 20 MG tablet Take 20 mg by mouth every morning.    Yes Historical Provider, MD  Skin Protectants, Misc. (CALAZIME SKIN PROTECTANT EX) Apply topically.   Yes Historical Provider, MD  HYDROcodone-acetaminophen (NORCO/VICODIN) 5-325 MG per tablet Take 1 tablet by mouth every 6 (six) hours as needed. Pain 03/27/12   Alexzandrew Julien Girt, PA-C   Physical Exam: Filed Vitals:   10/12/12 0028 10/12/12 0339 10/12/12 0656 10/12/12 0855  BP: 115/68 123/48 100/63 105/62  Pulse: 78 67 69 57  Temp: 99.1 F (37.3 C) 98.3 F (36.8 C) 98.6 F (37 C) 97.8 F (36.6 C)  TempSrc: Oral Oral Oral   Resp: 20 16 16 16   Height: 5' (1.524 m)     Weight: 58.06 kg (128 lb)     SpO2: 97% 98% 98% 98%     General:  Alert pleasant oriented no apparent distress however little sleepy  Eyes: EOMI no pallor  ENT: No lymphadenopathy no throat swelling moderate dentition  Neck: Soft supple  Cardiovascular: S1-S2 regular rate rhythm no murmur rub or gallop  Respiratory: Clinically clear  Abdomen: Soft nontender  Skin: Intact  Musculoskeletal: Right lower extremity externally rotated  Psychiatric: Euthymic  Neurologic: Grossly intact  Labs on Admission:  Basic Metabolic Panel:  Recent Labs Lab 10/12/12 0200  NA 139  K 4.0  CL 102  CO2 28  GLUCOSE 123*  BUN 17  CREATININE 0.82  CALCIUM 10.0   Liver Function Tests: No results found for this basename: AST, ALT, ALKPHOS, BILITOT, PROT, ALBUMIN,  in the last 168 hours No results found for this basename: LIPASE, AMYLASE,  in the last 168 hours No results found for this basename: AMMONIA,  in the last 168 hours CBC:  Recent Labs Lab 10/12/12 0200  WBC 9.2  NEUTROABS 7.8*  HGB 13.6  HCT 39.8  MCV 96.4  PLT 134*   Cardiac Enzymes: No results found for this basename: CKTOTAL, CKMB, CKMBINDEX, TROPONINI,  in the last 168 hours  BNP (last 3 results) No results found for  this basename: PROBNP,  in the last 8760 hours CBG: No results found for this basename: GLUCAP,  in the last 168 hours  Radiological Exams on Admission: Dg Hip Complete Right  10/12/2012   *RADIOLOGY REPORT*  Clinical Data: Fall.  Right hip injury and pain.  RIGHT HIP - COMPLETE 2+ VIEW  Comparison: None.  Findings: A subcapital right femoral neck fracture is seen with varus angulation.  No evidence of dislocation.  No pelvic fracture identified.  IMPRESSION: Subcapital right femoral neck fracture, with varus angulation.   Original Report Authenticated By: Myles Rosenthal, M.D.   Dg Chest Portable 1 View  10/12/2012   *RADIOLOGY REPORT*  Clinical Data: Larey Seat. Right hip fracture.  PORTABLE CHEST - 1 VIEW  Comparison: 03/25/2012.  Findings: The cardiac silhouette, mediastinal and hilar contours are normal and stable.  There are chronic lung changes with apical scarring.  No acute pulmonary findings.  No pleural effusion.  The bony thorax is intact.  IMPRESSION: Chronic lung changes but no acute pulmonary findings.   Original Report Authenticated By: Rudie Meyer, M.D.    EKG: Independently reviewed. Normal sinus rhythm some left bundle branch block PR interval 0.12 axis -10 no prior EKG to compare  Assessment/Plan Principal Problem:   Hip fracture, right Active Problems:   ABDOMINAL PAIN RIGHT LOWER QUADRANT   MVP (mitral valve prolapse)   Coronary artery disease   RA (rheumatoid arthritis)   Dysrhythmia   Rosacea   LBBB (left bundle branch block)   Hypothyroidism    1. Right subcapital femoral fracture-per orthopedics. Cleared from medical standpoint for orthopedic surgery although intermediate risk given nature of surgery, dysrhythmia + potential prior CAD.  Recommend vitamin D/cacliium replacement after surgery as she has had multiple recent fractures and has documented history of osteoporosis 2. P A FIb on atenolol-not on Coumadin-continue atenolol 25 mg daily. Outpatient followup with Dr.  Alanda Amass.   3. Rosacea continue doxycycline for suppressive therapy 4. Potential CAD in the past, left bundle branch block present currently 5. Hypothyroidism continue levothyroxine 75 mcg every afternoon 6. Rheumatoid arthritis-used to be on steroids at 4 years ago but discontinued them.  Outpatient followup with Dr. Kellie Simmering. 7. Reflux-continue pantoprazole 20 mg every morning  Dr. Despina Hick aware of patient-might have surgery  Code Status: Full (must indicate code status--if unknown or must be presumed, indicate so) Family Communication: Spoke Deneen Harts [phone] indicated she was clear for surgery and Orthopedics would be in touch with her Disposition Plan:  Inpatient, MEd-surg (indicate anticipated LOS)  Time spent:  63  Mahala Menghini [facility] Pager [phone]  If 7PM-7AM, please contact night-coverage [email or web address] Password [facility] 10/12/2012, 9:21 AM

## 2012-10-12 NOTE — Op Note (Signed)
OPERATIVE REPORT   PREOPERATIVE DIAGNOSIS:  Right femoral neck fracture displaced.   POSTOPERATIVE DIAGNOSIS:  Right femoral neck fracture displaced.   PROCEDURE:  Right hip hemiarthroplasty.   SURGEON: Ollen Gross, M.D.   ASSISTANT: Avel Peace, PA-C  ANESTHESIA: General  ESTIMATED BLOOD LOSS:100 ml    DRAINS: Hemovac x1.   COMPLICATIONS: None.   CONDITION: Stable to recovery.   BRIEF CLINICAL NOTE: Debra Richards is an 79 y.o. female with  significant dementia who had a fall yesterday leading to a displaced   Right femoral neck fracture. She was evaluated in the emergency  department and admitted to the medical service. She was cleared for  surgery and presents for right hip hemiarthroplasty.   PROCEDURE IN DETAIL: After successful administration of general  anesthetic, the patient was placed in the Left lateral decubitus  position with the Right side up and held with the hip positioner.Right lower extremity was isolated from her perineum with plastic drapes and  prepped and draped in the usual sterile fashion. Short posterolateral  incision was made with a 10 blade through the subcutaneous tissue to the  level of fascia lata which was incised in line with the skin incision.  The sciatic nerve was palpated and protected, and short rotators and  capsule were isolated off the femur. There was complete fracture of the  femoral neck. The femoral head was removed. Diameters 48 mm. We then  isolated the femur with retractors and freshened up the femoral neck cut  with an oscillating saw. I used a starter reamer to get into the  femoral canal and then irrigated with saline. A lateralizing reamer was  then placed. We then broached up to a size 6, which had excellent  rotational and torsional stability. Trial neck for the size 6 with a  28 mm + 1.5 head and 48 mm bipolar was placed. Hip was reduced with excellent  stability. I placed the right leg on top of the left  leg, and lengths  were equal. Hip was dislocated. All trials were removed. We trialed  the cement restrictor, and size 4 was most appropriate. The size 4  cement restrictor was placed at the appropriate depth in the femoral  canal. Canal was then prepared with pulsatile lavage and thoroughly  dried. Cement was mixed and once ready implantation was injected into  the femoral canal and pressurized. The size 6 [place] basic  cemented stem was then cemented into place in about 20 degrees of  anteversion. When the cement was fully hardened, then the permanent 28 mm + 1.5  head and 48 mm bipolar components were placed. Hip was reduced with  same stability parameters. Capsule and short rotators reattached to the  femur through drill holes with Ethibond suture. Fascia lata was closed  over Hemovac drain with running #1 V-Loc suture. Subcu closed with 2-0  Vicryl, and subcuticular running 4-0 Monocryl. The drains were hooked  to suction. Incision was cleaned and dried and Steri-Strips and a bulky  sterile dressing applied. She was then placed into a knee immobilizer,  awakened and transported to recovery in stable condition.   Ollen Gross, M.D.

## 2012-10-12 NOTE — ED Provider Notes (Signed)
History     CSN: [id number]  Arrival date & time 10/12/12  0020   First MD Initiated Contact with Patient 10/12/12 0025      Chief Complaint  Patient presents with  . Hip Injury  . Knee Injury    (Consider location/radiation/quality/duration/timing/severity/associated sxs/prior treatment) HPI HPI Comments: Debra Richards is a 79 y.o. female with a h/o knee replacement, kidney stones, CAD, GERD, hypothyroidism,  who presents to the Emergency Department complaining of right hip pain after falling over a rug in her home around 1900. Her husband helped her up and she used her walker. When getting ready for bed she found she could not bear weight on the right hip.   PCP Dr. Sherril Croon Orthopedist Dr. Lequita Halt Past Medical History  Diagnosis Date  . History of kidney stones   . Rosacea   . MVP (mitral valve prolapse) MILD  . Right ureteral stone   . Coronary artery disease     CARDIOLOGIST- DR Alanda Amass-- LAST VISIT 04-04-11 (will request note)---   (per note cardiolite 2009 negative,  echo jan.2010  . GERD (gastroesophageal reflux disease)     CONTROLLED W/ ACIPHEX AND prn prevacid  . Edema occasional in ankles  . Hypothyroidism   . Dysrhythmia     HX SVT AND PAF--- occ. palpitations  . LBBB (left bundle branch block) CHRONIC  . H/O hiatal hernia   . RA (rheumatoid arthritis)     followed by dr Kellie Simmering  . Atrial fibrillation     Past Surgical History  Procedure Laterality Date  . Moles excised  2012    on nose  . Extracorporeal shock wave lithotripsy  09-24-10    and 1990  . Cataract extraction w/ intraocular lens  implant, bilateral  4 yrs ago    both eyes  . Knee arthroscopy  04/17/2011, right    Procedure: ARTHROSCOPY KNEE;  Surgeon: Gus Rankin Aluisio;  Location: [facility];  Service: Orthopedics;  Laterality: Right;  WITH LATERAL MENISCAL DEBRIDEMENT  . Vaginal hysterectomy  1970  . Cholecystectomy  2005  . Total knee arthroplasty  03/23/2012   Procedure: TOTAL KNEE ARTHROPLASTY;  Surgeon: Loanne Drilling, MD;  Location: [facility];  Service: Orthopedics;  Laterality: Right;    Family History  Problem Relation Age of Onset  . Cancer Mother     COLON  . Heart disease Father     STROKE  . Stroke Father   . Ovarian cancer Maternal Grandmother     History  Substance Use Topics  . Smoking status: Former Smoker -- 0.25 packs/day for 10 years    Types: Cigarettes    Quit date: 04/12/1979  . Smokeless tobacco: Never Used  . Alcohol Use: No    OB History   Grav Para Term Preterm Abortions TAB SAB Ect Mult Living   2 2 2       2       Review of Systems  Constitutional: Negative for fever.       10 Systems reviewed and are negative for acute change except as noted in the HPI.  HENT: Negative for congestion.   Eyes: Negative for discharge and redness.  Respiratory: Negative for cough and shortness of breath.   Cardiovascular: Negative for chest pain.  Gastrointestinal: Negative for vomiting and abdominal pain.  Musculoskeletal: Negative for back pain.       Right hip pain  Skin: Negative for rash.  Neurological: Negative for syncope, numbness and headaches.  Psychiatric/Behavioral:  No behavior change.    Allergies  Penicillins  Home Medications   Current Outpatient Rx  Name  Route  Sig  Dispense  Refill  . Atenolol (TENORMIN PO)   Oral   Take 12.5 mg by mouth daily.          . clorazepate (TRANXENE) 7.5 MG tablet   Oral   Take 7.5 mg by mouth 2 (two) times daily as needed. sleep         . digoxin (LANOXIN) 0.125 MG tablet   Oral   Take 62.5 mcg by mouth every morning.          Marland Kitchen doxycycline (VIBRAMYCIN) 100 MG capsule   Oral   Take 100 mg by mouth daily as needed. rosacea         . HYDROcodone-acetaminophen (VICODIN) 5-500 MG per tablet   Oral   Take 1 tablet by mouth 2 (two) times daily as needed for pain.         Marland Kitchen levothyroxine (SYNTHROID, LEVOTHROID) 75 MCG tablet   Oral   Take 75  mcg by mouth every evening.          . pantoprazole (PROTONIX) 20 MG tablet   Oral   Take 20 mg by mouth every morning.          . Skin Protectants, Misc. (CALAZIME SKIN PROTECTANT EX)   Apply externally   Apply topically.         Marland Kitchen HYDROcodone-acetaminophen (NORCO/VICODIN) 5-325 MG per tablet   Oral   Take 1 tablet by mouth every 6 (six) hours as needed. Pain           BP 115/68  Pulse 78  Temp(Src) 99.1 F (37.3 C) (Oral)  Resp 20  Ht 5' (1.524 m)  Wt 128 lb (58.06 kg)  BMI 25 kg/m2  SpO2 97%  Physical Exam  Nursing note and vitals reviewed. Constitutional:  Awake, alert, nontoxic appearance.  HENT:  Head: Normocephalic and atraumatic.  Eyes: EOM are normal. Pupils are equal, round, and reactive to light.  Neck: Normal range of motion. Neck supple.  Cardiovascular: Normal rate and intact distal pulses.   Pulmonary/Chest: Effort normal and breath sounds normal. She exhibits no tenderness.  Abdominal: Soft. Bowel sounds are normal. There is no tenderness. There is no rebound.  Musculoskeletal: She exhibits no tenderness.  Baseline ROM, no obvious new focal weakness.Pain with palpation of the right hip. Limited ROM due to pain.   Neurological: She is alert.  Mental status and motor strength appears baseline for patient and situation.  Skin: Skin is warm. No rash noted.  Psychiatric: She has a normal mood and affect.    ED Course  Procedures (including critical care time) Results for orders placed during the hospital encounter of 10/12/12  CBC WITH DIFFERENTIAL      Result Value Range   WBC 9.2  4.0 - 10.5 K/uL   RBC 4.13  3.87 - 5.11 MIL/uL   Hemoglobin 13.6  12.0 - 15.0 g/dL   HCT 16.1  09.6 - 04.5 %   MCV 96.4  78.0 - 100.0 fL   MCH 32.9  26.0 - 34.0 pg   MCHC 34.2  30.0 - 36.0 g/dL   RDW 40.9  81.1 - 91.4 %   Platelets 134 (*) 150 - 400 K/uL   Neutrophils Relative % 86 (*) 43 - 77 %   Neutro Abs 7.8 (*) 1.7 - 7.7 K/uL   Lymphocytes Relative 7 (*)  12 -  46 %   Lymphs Abs 0.7  0.7 - 4.0 K/uL   Monocytes Relative 7  3 - 12 %   Monocytes Absolute 0.6  0.1 - 1.0 K/uL   Eosinophils Relative 0  0 - 5 %   Eosinophils Absolute 0.0  0.0 - 0.7 K/uL   Basophils Relative 0  0 - 1 %   Basophils Absolute 0.0  0.0 - 0.1 K/uL  BASIC METABOLIC PANEL      Result Value Range   Sodium 139  135 - 145 mEq/L   Potassium 4.0  3.5 - 5.1 mEq/L   Chloride 102  96 - 112 mEq/L   CO2 28  19 - 32 mEq/L   Glucose, Bld 123 (*) 70 - 99 mg/dL   BUN 17  6 - 23 mg/dL   Creatinine, Ser 3.66  0.50 - 1.10 mg/dL   Calcium 44.0  8.4 - 34.7 mg/dL   GFR calc non Af Amer 64 (*) >90 mL/min   GFR calc Af Amer 74 (*) >90 mL/min  URINALYSIS, ROUTINE W REFLEX MICROSCOPIC      Result Value Range   Color, Urine YELLOW  YELLOW   APPearance CLEAR  CLEAR   Specific Gravity, Urine 1.015  1.005 - 1.030   pH 7.5  5.0 - 8.0   Glucose, UA NEGATIVE  NEGATIVE mg/dL   Hgb urine dipstick NEGATIVE  NEGATIVE   Bilirubin Urine NEGATIVE  NEGATIVE   Ketones, ur NEGATIVE  NEGATIVE mg/dL   Protein, ur NEGATIVE  NEGATIVE mg/dL   Urobilinogen, UA 0.2  0.0 - 1.0 mg/dL   Nitrite NEGATIVE  NEGATIVE   Leukocytes, UA NEGATIVE  NEGATIVE    Dg Hip Complete Right  10/12/2012   *RADIOLOGY REPORT*  Clinical Data: Fall.  Right hip injury and pain.  RIGHT HIP - COMPLETE 2+ VIEW  Comparison: None.  Findings: A subcapital right femoral neck fracture is seen with varus angulation.  No evidence of dislocation.  No pelvic fracture identified.  IMPRESSION: Subcapital right femoral neck fracture, with varus angulation.   Original Report Authenticated By: Myles Rosenthal, M.D.   Dg Chest Portable 1 View  10/12/2012   *RADIOLOGY REPORT*  Clinical Data: Larey Seat. Right hip fracture.  PORTABLE CHEST - 1 VIEW  Comparison: 03/25/2012.  Findings: The cardiac silhouette, mediastinal and hilar contours are normal and stable.  There are chronic lung changes with apical scarring.  No acute pulmonary findings.  No pleural effusion.   The bony thorax is intact.  IMPRESSION: Chronic lung changes but no acute pulmonary findings.   Original Report Authenticated By: Rudie Meyer, M.D.    Date: 10/12/2012  0226  Rate: 73  Rhythm: normal sinus rhythm  QRS Axis: normal  Intervals: normal  ST/T Wave abnormalities: normal  Conduction Disutrbances:left bundle branch block  Narrative Interpretation:   Old EKG Reviewed: none available     0258  T/C to Dr. Darrelyn Hillock, orthopedist, case discussed, including:  HPI, pertinent PM/SHx, VS/PE, dx testing, ED course and treatment.  Agreeable to admission.  Requests to write temporary orders, med-surg  bed to Dr. Lequita Halt at [facility].  MDM  Patient with right hip pain after falling at home. Painful to walk and apply pressure. Xray with subcapital right femoral neck fracture. Foley catheter placed. Labs normal. EKG unchanged from previous. Call placed to Dr. Darrelyn Hillock, orthopedist on call for Dr. Lequita Halt. Patient to be transferred to [facility] for surgery. NPO. Pt stable in ED with no significant deterioration in condition.The patient  appears reasonably stabilized for admission considering the current resources, flow, and capabilities available in the ED at this time, and I doubt any other [facility] requiring further screening and/or treatment in the ED prior to admission.  MDM Reviewed: nursing note and vitals Interpretation: labs, ECG and x-ray           Nicoletta Dress. Colon Branch, MD 10/12/12 1610

## 2012-10-12 NOTE — Anesthesia Postprocedure Evaluation (Signed)
  Anesthesia Post-op Note  Patient: Debra Richards  Procedure(s) Performed: Procedure(s) (LRB): ARTHROPLASTY BIPOLAR HIP (Right)  Patient Location: PACU  Anesthesia Type: General  Level of Consciousness: awake and alert   Airway and Oxygen Therapy: Patient Spontanous Breathing  Post-op Pain: mild  Post-op Assessment: Post-op Vital signs reviewed, Patient's Cardiovascular Status Stable, Respiratory Function Stable, Patent Airway and No signs of Nausea or vomiting  Last Vitals:  Filed Vitals:   10/12/12 1343  BP: 114/62  Pulse: 50  Temp: 36.6 C  Resp: 16    Post-op Vital Signs: stable   Complications: No apparent anesthesia complications

## 2012-10-12 NOTE — ED Notes (Signed)
Patient sleeping at this time.

## 2012-10-12 NOTE — Transfer of Care (Signed)
Immediate Anesthesia Transfer of Care Note  Patient: Debra Richards  Procedure(s) Performed: Procedure(s) (LRB): ARTHROPLASTY BIPOLAR HIP (Right)  Patient Location: PACU  Anesthesia Type: General  Level of Consciousness: sedated, patient cooperative and responds to stimulaton  Airway & Oxygen Therapy: Patient Spontanous Breathing and Patient connected to face mask oxgen  Post-op Assessment: Report given to PACU RN and Post -op Vital signs reviewed and stable  Post vital signs: Reviewed and stable  Complications: No apparent anesthesia complications

## 2012-10-12 NOTE — ED Notes (Signed)
Patient presents to ER via [facility] with c/o right hip and right knee pain s/p fall.  Patient denies tripping over rug, but EMS states there was a rug in the vicinity.

## 2012-10-12 NOTE — Progress Notes (Signed)
Nutrition Brief Note  Patient identified on the Malnutrition Screening Tool (MST) Report and consult.   Body mass index is 25 kg/(m^2). Patient meets criteria for overweight based on current BMI.   Pt admitted with right hip fracture. Pt NPO for right hip hemiarthroplasty scheduled for this evening. Met with pt who reports eating 2 meals/day and a snack PTA with good appetite and stable weight. Pt denies any problems chewing/swallowing, does c/o occasional reflux. Diet likely to be advanced shortly after surgery. Encouraged pt to consume high protein diet post-op to promote wound healing/improved muscle strength. Sample meals/snacks discussed.   No nutrition interventions warranted at this time. If nutrition issues arise, please consult RD.   Levon Hedger MS, RD, LDN [phone] Pager [phone] After Hours Pager

## 2012-10-12 NOTE — Preoperative (Signed)
Beta Blockers   Reason not to administer Beta Blockers:Not Applicable 

## 2012-10-12 NOTE — H&P (View-Only) (Signed)
Reason for Consult: Right hip fracture Referring Physician: Dr. Landis Gandy  Debra Richards is an 79 y.o. female.  HPI: Debra Richards is a 79 y.o. Female who presents with the chief complaint of right hip pain. She reports that last evening around 1800 he was getting out of a chair at home when she fell onto her right side. She recalls hitting her right hip and right knee. She denies LOC or dizziness before the fall. She reports that she was able to ambulate that evening enough to get ready for bed. She continued to have pain despite resting in her bed. She then decided to present to the ED at [facility] as she could no longer ambulate due to the pain. She presented to the ED at [facility] around 2130. X-rays of the right hip revealed a right subcapital femoral neck fracture. She has a history of a right total knee arthroplasty in October 2013. She reports that she has been doing well in regards to that. She does have some discomfort over her right knee where she fell on it but reports that the hip is significantly more painful. She was transported to [facility] this morning to be evaluated and undergo surgical treatment for the right hip fracture by Dr. Lequita Halt.    Past Medical History  Diagnosis Date  . History of kidney stones   . Rosacea   . MVP (mitral valve prolapse) MILD  . Right ureteral stone   . Coronary artery disease     CARDIOLOGIST- DR Alanda Amass-- LAST VISIT 04-04-11 (will request note)---   (per note cardiolite 2009 negative,  echo jan.2010  . GERD (gastroesophageal reflux disease)     CONTROLLED W/ ACIPHEX AND prn prevacid  . Edema occasional in ankles  . Hypothyroidism   . Dysrhythmia     HX SVT AND PAF--- occ. palpitations  . LBBB (left bundle branch block) CHRONIC  . H/O hiatal hernia   . RA (rheumatoid arthritis)     followed by dr Kellie Simmering  . Atrial fibrillation     Past Surgical History  Procedure Laterality Date  . Moles excised  2012    on nose    . Extracorporeal shock wave lithotripsy  09-24-10    and 1990  . Cataract extraction w/ intraocular lens  implant, bilateral  4 yrs ago    both eyes  . Knee arthroscopy  04/17/2011, right    Procedure: ARTHROSCOPY KNEE;  Surgeon: Gus Rankin Aluisio;  Location: [facility];  Service: Orthopedics;  Laterality: Right;  WITH LATERAL MENISCAL DEBRIDEMENT  . Vaginal hysterectomy  1970  . Cholecystectomy  2005  . Total knee arthroplasty  03/23/2012    Procedure: TOTAL KNEE ARTHROPLASTY;  Surgeon: Loanne Drilling, MD;  Location: [facility];  Service: Orthopedics;  Laterality: Right;    Family History  Problem Relation Age of Onset  . Cancer Mother     COLON  . Heart disease Father     STROKE  . Stroke Father   . Ovarian cancer Maternal Grandmother     Social History:  reports that she quit smoking about 33 years ago. Her smoking use included Cigarettes. She has a 2.5 pack-year smoking history. She has never used smokeless tobacco. She reports that she does not drink alcohol or use illicit drugs.  Allergies:  Allergies  Allergen Reactions  . Penicillins Hives    Medications: I have reviewed the patient's current medications.  Results for orders placed during the hospital  encounter of 10/12/12 (from the past 48 hour(s))  CBC WITH DIFFERENTIAL     Status: Abnormal   Collection Time    10/12/12  2:00 AM      Result Value Range   WBC 9.2  4.0 - 10.5 K/uL   RBC 4.13  3.87 - 5.11 MIL/uL   Hemoglobin 13.6  12.0 - 15.0 g/dL   HCT 95.6  21.3 - 08.6 %   MCV 96.4  78.0 - 100.0 fL   MCH 32.9  26.0 - 34.0 pg   MCHC 34.2  30.0 - 36.0 g/dL   RDW 57.8  46.9 - 62.9 %   Platelets 134 (*) 150 - 400 K/uL   Neutrophils Relative % 86 (*) 43 - 77 %   Neutro Abs 7.8 (*) 1.7 - 7.7 K/uL   Lymphocytes Relative 7 (*) 12 - 46 %   Lymphs Abs 0.7  0.7 - 4.0 K/uL   Monocytes Relative 7  3 - 12 %   Monocytes Absolute 0.6  0.1 - 1.0 K/uL   Eosinophils Relative 0  0 - 5 %   Eosinophils Absolute  0.0  0.0 - 0.7 K/uL   Basophils Relative 0  0 - 1 %   Basophils Absolute 0.0  0.0 - 0.1 K/uL  BASIC METABOLIC PANEL     Status: Abnormal   Collection Time    10/12/12  2:00 AM      Result Value Range   Sodium 139  135 - 145 mEq/L   Potassium 4.0  3.5 - 5.1 mEq/L   Chloride 102  96 - 112 mEq/L   CO2 28  19 - 32 mEq/L   Glucose, Bld 123 (*) 70 - 99 mg/dL   BUN 17  6 - 23 mg/dL   Creatinine, Ser 5.28  0.50 - 1.10 mg/dL   Calcium 41.3  8.4 - 24.4 mg/dL   GFR calc non Af Amer 64 (*) >90 mL/min   GFR calc Af Amer 74 (*) >90 mL/min   Comment:            The eGFR has been calculated     using the CKD EPI equation.     This calculation has not been     validated in all clinical     situations.     eGFR's persistently     <90 mL/min signify     possible Chronic Kidney Disease.  URINALYSIS, ROUTINE W REFLEX MICROSCOPIC     Status: None   Collection Time    10/12/12  2:22 AM      Result Value Range   Color, Urine YELLOW  YELLOW   APPearance CLEAR  CLEAR   Specific Gravity, Urine 1.015  1.005 - 1.030   pH 7.5  5.0 - 8.0   Glucose, UA NEGATIVE  NEGATIVE mg/dL   Hgb urine dipstick NEGATIVE  NEGATIVE   Bilirubin Urine NEGATIVE  NEGATIVE   Ketones, ur NEGATIVE  NEGATIVE mg/dL   Protein, ur NEGATIVE  NEGATIVE mg/dL   Urobilinogen, UA 0.2  0.0 - 1.0 mg/dL   Nitrite NEGATIVE  NEGATIVE   Leukocytes, UA NEGATIVE  NEGATIVE   Comment: MICROSCOPIC NOT DONE ON URINES WITH NEGATIVE PROTEIN, BLOOD, LEUKOCYTES, NITRITE, OR GLUCOSE <1000 mg/dL.  CBC WITH DIFFERENTIAL     Status: Abnormal   Collection Time    10/12/12 10:00 AM      Result Value Range   WBC 5.9  4.0 - 10.5 K/uL   RBC 3.86 (*) 3.87 -  5.11 MIL/uL   Hemoglobin 12.9  12.0 - 15.0 g/dL   HCT 16.1  09.6 - 04.5 %   MCV 96.1  78.0 - 100.0 fL   MCH 33.4  26.0 - 34.0 pg   MCHC 34.8  30.0 - 36.0 g/dL   RDW 40.9  81.1 - 91.4 %   Platelets 122 (*) 150 - 400 K/uL   Neutrophils Relative % 74  43 - 77 %   Neutro Abs 4.4  1.7 - 7.7 K/uL    Lymphocytes Relative 15  12 - 46 %   Lymphs Abs 0.9  0.7 - 4.0 K/uL   Monocytes Relative 10  3 - 12 %   Monocytes Absolute 0.6  0.1 - 1.0 K/uL   Eosinophils Relative 1  0 - 5 %   Eosinophils Absolute 0.1  0.0 - 0.7 K/uL   Basophils Relative 0  0 - 1 %   Basophils Absolute 0.0  0.0 - 0.1 K/uL  COMPREHENSIVE METABOLIC PANEL     Status: Abnormal   Collection Time    10/12/12 10:00 AM      Result Value Range   Sodium 141  135 - 145 mEq/L   Potassium 4.0  3.5 - 5.1 mEq/L   Chloride 104  96 - 112 mEq/L   CO2 30  19 - 32 mEq/L   Glucose, Bld 119 (*) 70 - 99 mg/dL   BUN 16  6 - 23 mg/dL   Creatinine, Ser 7.82  0.50 - 1.10 mg/dL   Calcium 9.3  8.4 - 95.6 mg/dL   Total Protein 5.7 (*) 6.0 - 8.3 g/dL   Albumin 3.3 (*) 3.5 - 5.2 g/dL   AST 213 (*) 0 - 37 U/L   ALT 56 (*) 0 - 35 U/L   Alkaline Phosphatase 72  39 - 117 U/L   Total Bilirubin 0.7  0.3 - 1.2 mg/dL   GFR calc non Af Amer 74 (*) >90 mL/min   GFR calc Af Amer 86 (*) >90 mL/min   Comment:            The eGFR has been calculated     using the CKD EPI equation.     This calculation has not been     validated in all clinical     situations.     eGFR's persistently     <90 mL/min signify     possible Chronic Kidney Disease.  TYPE AND SCREEN     Status: None   Collection Time    10/12/12 10:00 AM      Result Value Range   ABO/RH(D) A NEG     Antibody Screen NEG     Sample Expiration 10/15/2012      Dg Hip Complete Right  10/12/2012   *RADIOLOGY REPORT*  Clinical Data: Fall.  Right hip injury and pain.  RIGHT HIP - COMPLETE 2+ VIEW  Comparison: None.  Findings: A subcapital right femoral neck fracture is seen with varus angulation.  No evidence of dislocation.  No pelvic fracture identified.  IMPRESSION: Subcapital right femoral neck fracture, with varus angulation.   Original Report Authenticated By: Myles Rosenthal, M.D.   Dg Chest Portable 1 View  10/12/2012   *RADIOLOGY REPORT*  Clinical Data: Larey Seat. Right hip fracture.   PORTABLE CHEST - 1 VIEW  Comparison: 03/25/2012.  Findings: The cardiac silhouette, mediastinal and hilar contours are normal and stable.  There are chronic lung changes with apical scarring.  No acute pulmonary findings.  No pleural effusion.  The bony thorax is intact.  IMPRESSION: Chronic lung changes but no acute pulmonary findings.   Original Report Authenticated By: Rudie Meyer, M.D.    Review of Systems  Constitutional: Positive for malaise/fatigue. Negative for fever, chills, weight loss and diaphoresis.  HENT: Negative.  Negative for neck pain.   Eyes: Negative.   Respiratory: Negative.   Cardiovascular: Negative.   Gastrointestinal: Negative.   Genitourinary: Negative.   Musculoskeletal: Positive for joint pain and falls. Negative for myalgias and back pain.       Right hip pain  Skin: Negative.   Neurological: Negative.  Negative for weakness.  Endo/Heme/Allergies: Negative.   Psychiatric/Behavioral: Negative.    Blood pressure 105/62, pulse 57, temperature 97.8 F (36.6 C), temperature source Oral, resp. rate 16, height 5' (1.524 m), weight 58.06 kg (128 lb), SpO2 98.00%. Physical Exam  Constitutional: She is oriented to person, place, and time. She appears well-developed and well-nourished. No distress.  HENT:  Head: Normocephalic and atraumatic.  Right Ear: External ear normal.  Left Ear: External ear normal.  Nose: Nose normal.  Mouth/Throat: Oropharynx is clear and moist.  Eyes: Conjunctivae and EOM are normal. Pupils are equal, round, and reactive to light.  Neck: Normal range of motion. No tracheal deviation present. No thyromegaly present.  Cardiovascular: Normal rate, regular rhythm, normal heart sounds and intact distal pulses.   No murmur heard. Respiratory: Effort normal and breath sounds normal. No respiratory distress. She has no wheezes. She exhibits no tenderness.  GI: Soft. Bowel sounds are normal. She exhibits no distension and no mass. There is no  tenderness.  Musculoskeletal:       Right hip: She exhibits decreased range of motion, decreased strength and deformity.       Left hip: Normal.       Right knee: She exhibits normal range of motion, no swelling, no effusion and no erythema. Tenderness found.       Left knee: Normal.       Right lower leg: She exhibits no tenderness and no swelling.       Left lower leg: She exhibits no tenderness and no swelling.       Legs: Lymphadenopathy:    She has no cervical adenopathy.  Neurological: She is alert and oriented to person, place, and time. She has normal strength. No sensory deficit.  Skin: No rash noted. She is not diaphoretic. No erythema.  Psychiatric: She has a normal mood and affect. Her behavior is normal.    Assessment/Plan: The patient has a right subcapital femoral neck fracture. Dr. Lequita Halt reviewed her films and reports that she will require a right hip hemiarthroplasty. She will be kept NPO and transported to the OR this evening for surgery. Will update labs prior to surgery and obtain a type and screen. Maintain foley catheter.   Braydin Aloi LAUREN 10/12/2012, 11:05 AM

## 2012-10-13 MED ORDER — MENTHOL 3 MG MT LOZG
1.0000 | LOZENGE | OROMUCOSAL | Status: DC | PRN
  Filled 2012-10-13: qty 9

## 2012-10-13 MED ORDER — ALUM & MAG HYDROXIDE-SIMETH 200-200-20 MG/5ML PO SUSP
30.0000 mL | Freq: Four times a day (QID) | ORAL | Status: DC | PRN
  Administered 2012-10-13 – 2012-10-14 (×2): 30 mL via ORAL
  Filled 2012-10-13 (×2): qty 30

## 2012-10-13 MED ORDER — CALCIUM CARBONATE-VITAMIN D 500-200 MG-UNIT PO TABS
1.0000 | ORAL_TABLET | Freq: Two times a day (BID) | ORAL | Status: DC
  Administered 2012-10-13 – 2012-10-15 (×3): 1 via ORAL
  Filled 2012-10-13 (×6): qty 1

## 2012-10-13 MED ORDER — PANTOPRAZOLE SODIUM 20 MG PO TBEC
20.0000 mg | DELAYED_RELEASE_TABLET | Freq: Every day | ORAL | Status: DC
  Administered 2012-10-13 – 2012-10-14 (×2): 20 mg via ORAL
  Filled 2012-10-13 (×2): qty 1

## 2012-10-13 NOTE — Evaluation (Addendum)
Physical Therapy Evaluation Patient Details Name: Debra Richards MRN: [id number] DOB: [birth year] Today's Date: 10/13/2012 Time: 7829-5621 PT Time Calculation (min): 24 min  PT Assessment / Plan / Recommendation Clinical Impression  Pt is an 79 year old female s/p fall at home resulting in R hip fx and now with R hip hemiarthroplasty.  Pt would benefit from acute PT services in order to improve independence with transfers and gait while maintaining posterior hip precautions to prepare for d/c to next venue.    PT Assessment  Patient needs continued PT services    Follow Up Recommendations  Supervision/Assistance - 24 hour;SNF    Does the patient have the potential to tolerate intense rehabilitation      Barriers to Discharge        Equipment Recommendations  None recommended by PT    Recommendations for Other Services     Frequency Min 6X/week    Precautions / Restrictions Precautions Precautions: Fall;Posterior Hip Restrictions Weight Bearing Restrictions: No RLE Weight Bearing: Weight bearing as tolerated   Pertinent Vitals/Pain Pt reports increased pain with movement however able to tolerate activity with assist and RW     Mobility  Bed Mobility Bed Mobility: Supine to Sit Supine to Sit: 1: +2 Total assist Supine to Sit: Patient Percentage: 50% Details for Bed Mobility Assistance: verbal cues for safe technique and precautions, assist for R LE and trunk, pt able to scoot hips to EOB with cuing Transfers Transfers: Sit to Stand;Stand to Sit Sit to Stand: 1: +2 Total assist;With upper extremity assist;From chair/3-in-1;From bed Sit to Stand: Patient Percentage: 50% Stand to Sit: 1: +2 Total assist;With upper extremity assist;To chair/3-in-1;To bed Stand to Sit: Patient Percentage: 50% Details for Transfer Assistance: verbal cues for safe technique and precautions Ambulation/Gait Ambulation/Gait Assistance: 3: Mod assist Ambulation Distance (Feet): 45  Feet Assistive device: Rolling walker Ambulation/Gait Assistance Details: increased verbal cues for sequence, RW safety and distance, turning toward unaffected LE, pt reported increased fatigue with droopy eyelids so chair brought behind pt and pt assisted to chair, pt states just fatigue however suspect dizziness due to eyes closing and less talkative  Gait Pattern: Step-to pattern;Decreased stance time - right;Decreased step length - right;Antalgic;Narrow base of support Gait velocity: decreased    Exercises     PT Diagnosis: Difficulty walking;Acute pain  PT Problem List: Decreased strength;Decreased range of motion;Decreased balance;Decreased activity tolerance;Decreased mobility;Decreased knowledge of precautions;Decreased safety awareness;Decreased knowledge of use of DME;Pain PT Treatment Interventions: Gait training;DME instruction;Functional mobility training;Therapeutic activities;Therapeutic exercise;Patient/family education   PT Goals Acute Rehab PT Goals PT Goal Formulation: With patient Time For Goal Achievement: 10/20/12 Potential to Achieve Goals: Fair Pt will go Supine/Side to Sit: with supervision PT Goal: Supine/Side to Sit - Progress: Goal set today Pt will go Sit to Supine/Side: with supervision PT Goal: Sit to Supine/Side - Progress: Goal set today Pt will go Sit to Stand: with supervision PT Goal: Sit to Stand - Progress: Goal set today Pt will go Stand to Sit: with supervision PT Goal: Stand to Sit - Progress: Goal set today Pt will Ambulate: 51 - 150 feet;with supervision;with least restrictive assistive device PT Goal: Ambulate - Progress: Goal set today  Visit Information  Last PT Received On: 10/13/12 Assistance Needed: +2    Subjective Data  Subjective: I think that's my daughter up here yelling.  I hope she didnt fall and you all aren't telling me.  (pt appears confused, nsg tech contacted daughter by phone for pt to  talk with her after session)    Prior Functioning  Home Living Lives With: Spouse Type of Home: House Home Access: Stairs to enter [employer] of Steps: pt states stairs at home but not able to give details due to cognition Home Adaptive Equipment: Walker - rolling Prior Function Level of Independence: Independent with assistive device(s) Communication Communication: No difficulties    Cognition  Cognition Arousal/Alertness: Awake/alert Behavior During Therapy: WFL for tasks assessed/performed Overall Cognitive Status: No family/caregiver present to determine baseline cognitive functioning Area of Impairment: Orientation Orientation Level: Disoriented to;Time;Place General Comments: pt seems confused this morning stating daughter is in hospital and worried that daughter fell here trying to visit her    Extremity/Trunk Assessment Right Lower Extremity Assessment RLE ROM/Strength/Tone: Deficits RLE ROM/Strength/Tone Deficits: pt unable to move against gravity without assist, increased pain with movement Left Lower Extremity Assessment LLE ROM/Strength/Tone: [facility] for tasks assessed   Balance    End of Session PT - End of Session Activity Tolerance: Patient limited by fatigue;Patient limited by pain Patient left: in bed;with call bell/phone within reach;with bed alarm set Nurse Communication: Mobility status (observed pt ambulating) Pillow placed between legs to assist with keeping precautions  GP     Debra Richards,Debra Richards 10/13/2012, 11:36 AM Debra Richards, PT, DPT 10/13/2012 Pager: [phone]

## 2012-10-13 NOTE — Progress Notes (Signed)
PROGRESS NOTE  Debra Richards ZOX:[id number] DOB: 06-Aug-1925 DOA: 10/12/2012 PCP: Ignatius Specking., MD  Brief narrative: 79 yr old CF admitted 10/12/12 with # R hip.  Underwent R Hip Hemi-arthroplasty 5/19 by Dr. Despina Hick  Past medical history-As per Problem list  Consultants:  Ortho Alusio  Procedures:  R THR 5/19  Antibiotics:  none   Subjective  Alert.  States pain isn't too bad at rest.  Needed pain medicine [tramadol, robaxin] this am however.  Has Genella Rife and needed her PPI in the early am.  States also intolerance to statins.  Seems accepting of the fact she might require short term SNF placement-wants this in [place], close to her house. Denies Cp/v/sob, but mild reflux this am. Nursing reports intermittent mild confusion   Objective    Interim History: none  Telemetry: none  Objective: Filed Vitals:   10/12/12 2015 10/12/12 2116 10/13/12 0218 10/13/12 0542  BP: 111/54 127/48 114/63 105/56  Pulse: 55 59 69 65  Temp: 97.4 F (36.3 C) 97.3 F (36.3 C) 98.1 F (36.7 C) 98.2 F (36.8 C)  TempSrc:  Axillary Oral Oral  Resp: 15 16 14 16   Height:      Weight:      SpO2: 98% 100% 100% 100%    Intake/Output Summary (Last 24 hours) at 10/13/12 0809 Last data filed at 10/13/12 0700  Gross per 24 hour  Intake 3347.5 ml  Output   1600 ml  Net 1747.5 ml    Exam: like NSR  General: alert pleasant tangential Cardiovascular:  s1 s2 no m/r/g, sounds predominantly NSR Respiratory:  Clear  Abdomen: soft, NT/ND Skin:nad-wound not visualized Neuro: grossly intact  Data Reviewed: Basic Metabolic Panel:  Recent Labs Lab 10/12/12 0200 10/12/12 1000  NA 139 141  K 4.0 4.0  CL 102 104  CO2 28 30  GLUCOSE 123* 119*  BUN 17 16  CREATININE 0.82 0.79  CALCIUM 10.0 9.3   Liver Function Tests:  Recent Labs Lab 10/12/12 1000  AST 125*  ALT 56*  ALKPHOS 72  BILITOT 0.7  PROT 5.7*  ALBUMIN 3.3*   No results found for this basename: LIPASE, AMYLASE,  in  the last 168 hours No results found for this basename: AMMONIA,  in the last 168 hours CBC:  Recent Labs Lab 10/12/12 0200 10/12/12 1000  WBC 9.2 5.9  NEUTROABS 7.8* 4.4  HGB 13.6 12.9  HCT 39.8 37.1  MCV 96.4 96.1  PLT 134* 122*   Cardiac Enzymes: No results found for this basename: CKTOTAL, CKMB, CKMBINDEX, TROPONINI,  in the last 168 hours BNP: No components found with this basename: POCBNP,  CBG: No results found for this basename: GLUCAP,  in the last 168 hours  Recent Results (from the past 240 hour(s))  SURGICAL PCR SCREEN     Status: None   Collection Time    10/12/12 11:23 AM      Result Value Range Status   MRSA, PCR NEGATIVE  NEGATIVE Final   Staphylococcus aureus NEGATIVE  NEGATIVE Final   Comment:            The Xpert SA Assay (FDA     approved for NASAL specimens     in patients over 41 years of age),     is one component of     a comprehensive surveillance     program.  Test performance has     been validated by [employer] for patients greater  than or equal to 67 year old.     It is not intended     to diagnose infection nor to     guide or monitor treatment.     Studies:              All Imaging reviewed and is as per above notation   Scheduled Meds: . atenolol  25 mg Oral Daily  . digoxin  62.5 mcg Oral Daily  . enoxaparin (LOVENOX) injection  40 mg Subcutaneous Q24H  . levothyroxine  75 mcg Oral QPM  . pantoprazole  20 mg Oral QAC breakfast   Continuous Infusions: . sodium chloride 20 mL/hr at 10/13/12 0752     Assessment/Plan: 1. Right subcapital femoral fracture-per orthopedics Dr. Despina Hick.  S/p R THR 5/19.  Oscal-d BID.  Pain management, Weight bearing and Anti-coagulation per ortho 2. P A FIb on atenolol-not on Coumadin-continue atenolol 25 mg daily. Outpatient followup with Dr. Alanda Amass.  3. Elevated LFT's and TCP-? Hematologic issue vs Rheumatoid.  Recommend further work-up outpatient with [facility] liver, on repeat CBC in am  get Peripheral smear 4. Rosacea-continue doxycycline for suppressive therapy 5. Potential CAD in the past, left bundle branch block present currently 6. Hypothyroidism- continue levothyroxine 75 mcg every afternoon 7. Rheumatoid arthritis-used to be on steroids at 4 years ago but discontinued them. Outpatient followup with Dr. Kellie Simmering. 8. Reflux-continue pantoprazole 20 mg every morning   Code Status: Full Family Communication: Spoke with daughter briefly.  Explained might need SNf.  Understands Disposition Plan: inpatient, ? SNF 1-2 days pending PT/Ot eval   Pleas Koch, MD  [facility] Pager [phone] 10/13/2012, 8:09 AM    LOS: 1 day

## 2012-10-13 NOTE — Progress Notes (Signed)
Clinical Social Work Department CLINICAL SOCIAL WORK PLACEMENT NOTE 10/13/2012  Patient:  JOLLEEN, SEMAN  Account Number:  [id number] Admit date:  10/12/2012  Clinical Social Worker:  Cori Razor, LCSW  Date/time:  10/13/2012 11:25 AM  Clinical Social Work is seeking post-discharge placement for this patient at the following level of care:   SKILLED NURSING   (*CSW will update this form in Epic as items are completed)   10/13/2012  Patient/family provided with [facility] Department of Clinical Social Work's list of facilities offering this level of care within the geographic area requested by the patient (or if unable, by the patient's family).  10/13/2012  Patient/family informed of their freedom to choose among providers that offer the needed level of care, that participate in Medicare, Medicaid or managed care program needed by the patient, have an available bed and are willing to accept the patient.  10/13/2012  Patient/family informed of [facility]' ownership interest in [facility], as well as of the fact that they are under no obligation to receive care at this facility.  PASARR submitted to EDS on 10/12/2012 PASARR number received from EDS on 10/12/2012  FL2 transmitted to all facilities in geographic area requested by pt/family on  10/13/2012 FL2 transmitted to all facilities within larger geographic area on   Patient informed that his/her managed care company has contracts with or will negotiate with  certain facilities, including the following:     Patient/family informed of bed offers received:   Patient chooses bed at  Physician recommends and patient chooses bed at    Patient to be transferred to  on   Patient to be transferred to facility by   The following physician request were entered in Epic:   Additional Comments: Cori Razor LCSW [phone]

## 2012-10-13 NOTE — Progress Notes (Signed)
Clinical Social Work Department BRIEF PSYCHOSOCIAL ASSESSMENT 10/13/2012  Patient:  Debra Richards, Debra Richards     Account Number:  [id number]     Admit date:  10/12/2012  Clinical Social Worker:  Candie Chroman  Date/Time:  10/13/2012 11:18 AM  Referred by:  Physician  Date Referred:  10/13/2012 Referred for  SNF Placement   Other Referral:   Interview type:  Patient Other interview type:    PSYCHOSOCIAL DATA Living Status:  HUSBAND Admitted from facility:   Level of care:   Primary support name:  Corie Chiquito Primary support relationship to patient:  SPOUSE Degree of support available:   supportive    CURRENT CONCERNS Current Concerns  Post-Acute Placement   Other Concerns:    SOCIAL WORK ASSESSMENT / PLAN Pt is an 79 yr old female living at home prior to hospitalization. CSW met with pt to assist with d/c planning. Pt feels [facility] Rehab will be needed following hospital d/c. She would like to go to [place]  for rehab. SNF has been contacted and bed will be available at time of d/c. CSW will continue to follow to assist with d/c planning to SNF.   Assessment/plan status:  Psychosocial Support/Ongoing Assessment of Needs Other assessment/ plan:   Information/referral to community resources:   SNF list provided.    PATIENT'S/FAMILY'S RESPONSE TO PLAN OF CARE: Pt was hoping to go home but feels ST Rehab is needed.   Cori Razor LCSW [phone]

## 2012-10-13 NOTE — Progress Notes (Signed)
Subjective: 1 Day Post-Op Procedure(s) (LRB): ARTHROPLASTY BIPOLAR HIP (Right) Patient reports pain as mild.   Patient seen in rounds with Dr. Lequita Halt. Patient is well, but has had some minor complaints of pain in the hip, requiring pain medications We will start therapy today.  Plan is to go Skilled nursing facility after hospital stay.  Objective: Vital signs in last 24 hours: Temp:  [97.3 F (36.3 C)-98.2 F (36.8 C)] 98.2 F (36.8 C) (05/20 0542) Pulse Rate:  [50-69] 65 (05/20 0542) Resp:  [8-16] 16 (05/20 0542) BP: (105-127)/(47-66) 105/56 mmHg (05/20 0542) SpO2:  [98 %-100 %] 100 % (05/20 0542)  Intake/Output from previous day: 05/19 0701 - 05/20 0700 In: 3347.5 [P.O.:120; I.V.:3227.5] Out: 1600 [Urine:1400; Drains:100; Blood:100]   Recent Labs  10/12/12 0200 10/12/12 1000  HGB 13.6 12.9    Recent Labs  10/12/12 0200 10/12/12 1000  WBC 9.2 5.9  RBC 4.13 3.86*  HCT 39.8 37.1  PLT 134* 122*    Recent Labs  10/12/12 0200 10/12/12 1000  NA 139 141  K 4.0 4.0  CL 102 104  CO2 28 30  BUN 17 16  CREATININE 0.82 0.79  GLUCOSE 123* 119*  CALCIUM 10.0 9.3   No results found for this basename: LABPT, INR,  in the last 72 hours  EXAM General - Patient is Alert, Appropriate and Oriented Extremity - Neurovascular intact Sensation intact distally Dorsiflexion/Plantar flexion intact Dressing - dressing C/D/I Motor Function - intact, moving foot and toes well on exam.   Past Medical History  Diagnosis Date  . History of kidney stones   . Rosacea   . MVP (mitral valve prolapse) MILD  . Right ureteral stone   . Coronary artery disease     CARDIOLOGIST- DR Alanda Amass-- LAST VISIT 04-04-11 (will request note)---   (per note cardiolite 2009 negative,  echo jan.2010  . GERD (gastroesophageal reflux disease)     CONTROLLED W/ ACIPHEX AND prn prevacid  . Edema occasional in ankles  . Hypothyroidism   . Dysrhythmia     HX SVT AND PAF--- occ. palpitations  .  LBBB (left bundle branch block) CHRONIC  . H/O hiatal hernia   . RA (rheumatoid arthritis)     followed by dr Kellie Simmering  . Atrial fibrillation     Assessment/Plan: 1 Day Post-Op Procedure(s) (LRB): ARTHROPLASTY BIPOLAR HIP (Right) Principal Problem:   Hip fracture, right Active Problems:   ABDOMINAL PAIN RIGHT LOWER QUADRANT   MVP (mitral valve prolapse)   Coronary artery disease   RA (rheumatoid arthritis)   Dysrhythmia   Rosacea   LBBB (left bundle branch block)   Hypothyroidism  Estimated body mass index is 25 kg/(m^2) as calculated from the following:   Height as of this encounter: 5' (1.524 m).   Weight as of this encounter: 58.06 kg (128 lb). Advance diet Up with therapy Discharge to SNF  Patient got confused postop back last October 2013 following her total knee replacement.  Will need to monitor for mental status changes.  Tramadol for pain, but has vicodin ordered.  DC Toradol since starting on Lovenox prophylaxis today.  DVT Prophylaxis - Lovenox Weight-Bearing as tolerated to right leg No vaccines. D/C O2 and Pulse OX and try on Room [address]  Patrica Duel 10/13/2012, 8:44 AM

## 2012-10-14 ENCOUNTER — Encounter (HOSPITAL_COMMUNITY): Payer: Self-pay | Admitting: Orthopedic Surgery

## 2012-10-14 DIAGNOSIS — R4182 Altered mental status, unspecified: Secondary | ICD-10-CM

## 2012-10-14 DIAGNOSIS — S72009A Fracture of unspecified part of neck of unspecified femur, initial encounter for closed fracture: Secondary | ICD-10-CM

## 2012-10-14 DIAGNOSIS — I447 Left bundle-branch block, unspecified: Secondary | ICD-10-CM

## 2012-10-14 DIAGNOSIS — I059 Rheumatic mitral valve disease, unspecified: Secondary | ICD-10-CM

## 2012-10-14 DIAGNOSIS — E039 Hypothyroidism, unspecified: Secondary | ICD-10-CM

## 2012-10-14 LAB — URINALYSIS, ROUTINE W REFLEX MICROSCOPIC
Bilirubin Urine: NEGATIVE
Ketones, ur: NEGATIVE mg/dL
Nitrite: NEGATIVE
pH: 7 (ref 5.0–8.0)

## 2012-10-14 LAB — BASIC METABOLIC PANEL
BUN: 11 mg/dL (ref 6–23)
Calcium: 8.8 mg/dL (ref 8.4–10.5)
Creatinine, Ser: 0.67 mg/dL (ref 0.50–1.10)
GFR calc Af Amer: >90 mL/min (ref >90)
GFR calc non Af Amer: 79 mL/min — ABNORMAL LOW (ref >90)

## 2012-10-14 LAB — CBC
HCT: 33 % — ABNORMAL LOW (ref 36.0–46.0)
MCH: 31 pg (ref 26.0–34.0)
MCHC: 32.4 g/dL (ref 30.0–36.0)
MCV: 95.7 fL (ref 78.0–100.0)
RDW: 13.4 % (ref 11.5–15.5)

## 2012-10-14 LAB — URINE MICROSCOPIC-ADD ON

## 2012-10-14 NOTE — Progress Notes (Signed)
   Subjective: 2 Days Post-Op Procedure(s) (LRB): ARTHROPLASTY BIPOLAR HIP (Right) Patient reports pain as mild and moderate.   Patient seen in rounds with Dr. Lequita Halt.  Patient confused last night and this morning.  DC the norco and robaxin. Patient is having problems with confusion. Plan is to go Skilled nursing facility after hospital stay.  Objective: Vital signs in last 24 hours: Temp:  [97.3 F (36.3 C)-99.3 F (37.4 C)] 99.3 F (37.4 C) (05/21 0710) Pulse Rate:  [65-87] 87 (05/21 0710) Resp:  [16-20] 20 (05/21 0710) BP: (84-119)/(41-65) 119/59 mmHg (05/21 0710) SpO2:  [97 %-100 %] 97 % (05/21 0710)  Intake/Output from previous day:  Intake/Output Summary (Last 24 hours) at 10/14/12 0743 Last data filed at 10/14/12 0710  Gross per 24 hour  Intake    120 ml  Output    950 ml  Net   -830 ml     Labs:  Recent Labs  10/12/12 0200 10/12/12 1000 10/14/12 0508  HGB 13.6 12.9 10.7*    Recent Labs  10/12/12 1000 10/14/12 0508  WBC 5.9 8.2  RBC 3.86* 3.45*  HCT 37.1 33.0*  PLT 122* 111*    Recent Labs  10/12/12 1000 10/14/12 0508  NA 141 135  K 4.0 4.2  CL 104 100  CO2 30 29  BUN 16 11  CREATININE 0.79 0.67  GLUCOSE 119* 118*  CALCIUM 9.3 8.8   No results found for this basename: LABPT, INR,  in the last 72 hours  EXAM General - Patient is Alert and Confused Extremity - Neurovascular intact Sensation intact distally Dorsiflexion/Plantar flexion intact No cellulitis present Dressing/Incision - clean, dry, no drainage, healing Motor Function - intact, moving foot and toes well on exam.   Past Medical History  Diagnosis Date  . History of kidney stones   . Rosacea   . MVP (mitral valve prolapse) MILD  . Right ureteral stone   . Coronary artery disease     CARDIOLOGIST- DR Alanda Amass-- LAST VISIT 04-04-11 (will request note)---   (per note cardiolite 2009 negative,  echo jan.2010  . GERD (gastroesophageal reflux disease)     CONTROLLED W/  ACIPHEX AND prn prevacid  . Edema occasional in ankles  . Hypothyroidism   . Dysrhythmia     HX SVT AND PAF--- occ. palpitations  . LBBB (left bundle branch block) CHRONIC  . H/O hiatal hernia   . RA (rheumatoid arthritis)     followed by dr Kellie Simmering  . Atrial fibrillation     Assessment/Plan: 2 Days Post-Op Procedure(s) (LRB): ARTHROPLASTY BIPOLAR HIP (Right) Principal Problem:   Hip fracture, right Active Problems:   ABDOMINAL PAIN RIGHT LOWER QUADRANT   MVP (mitral valve prolapse)   Coronary artery disease   RA (rheumatoid arthritis)   Dysrhythmia   Rosacea   LBBB (left bundle branch block)   Hypothyroidism   Altered mental status  Estimated body mass index is 25 kg/(m^2) as calculated from the following:   Height as of this encounter: 5' (1.524 m).   Weight as of this encounter: 58.06 kg (128 lb). Up with therapy Plan for discharge tomorrow Discharge to SNF Recheck labs in the morning.  DVT Prophylaxis - Lovenox Weight Bearing As Tolerated right Leg  Aron Needles 10/14/2012, 7:43 AM

## 2012-10-14 NOTE — Care Management Note (Signed)
    Page 1 of 1   10/14/2012     7:18:37 PM   CARE MANAGEMENT NOTE 10/14/2012  Patient:  Debra Richards, Debra Richards   Account Number:  [id number]  Date Initiated:  10/14/2012  Documentation initiated by:  Colleen Can  Subjective/Objective Assessment:   dx  fx rt hip; hemiarthroplasty     Action/Plan:   SNF rehab   Anticipated DC Date:  10/15/2012   Anticipated DC Plan:  SKILLED NURSING FACILITY  In-house referral  Clinical Social Worker      DC Planning Services  CM consult      Choice offered to / List presented to:             Status of service:  Completed, signed off Medicare Important Message given?  NA - LOS <3 / Initial given by admissions (If response is "NO", the following Medicare IM given date fields will be blank) Date Medicare IM given:   Date Additional Medicare IM given:    Discharge Disposition:    Per UR Regulation:    If discussed at Long Length of Stay Meetings, dates discussed:    Comments:

## 2012-10-14 NOTE — Progress Notes (Signed)
Physical Therapy Treatment Patient Details Name: Debra Richards MRN: [id number] DOB: 1925/07/25 Today's Date: 10/14/2012 Time: 4098-1191 PT Time Calculation (min): 24 min  PT Assessment / Plan / Recommendation Comments on Treatment Session  POD # 2  R Hemiarthroplasty due to a fall/fx.  Assisted pt off BSC then amb in hallway.  Pt's cognition improving but still requires MAX VC's for her THP.  Pt plans to D/C to SNF for ST Rehab.    Follow Up Recommendations  SNF     Does the patient have the potential to tolerate intense rehabilitation     Barriers to Discharge        Equipment Recommendations  None recommended by PT    Recommendations for Other Services    Frequency Min 6X/week   Plan      Precautions / Restrictions Precautions Precautions: Fall;Posterior Hip Precaution Comments: Pt unaware of her THP and requires 100% cueing during sit to stand to sit    Pertinent Vitals/Pain C/o "soreness"    Mobility  Bed Mobility Bed Mobility: Supine to Sit Supine to Sit: 1: +1 Total assist Supine to Sit: Patient Percentage: 50% Details for Bed Mobility Assistance: assisted pt back to bed with increased time for positioning to avoid THP Transfers Transfers: Sit to Stand;Stand to Sit Sit to Stand: 2: Max assist Stand to Sit: 2: Max assist Details for Transfer Assistance: 100% VC's to avoid hip flex > 90' and proper hand placement to control decend Ambulation/Gait Ambulation/Gait Assistance: 3: Mod assist Ambulation Distance (Feet): 55 Feet Assistive device: Rolling walker Ambulation/Gait Assistance Details: 75% VC's on proper sequencing, proper walker to self distance and safety with turns.  Pt gets easily distracted, turns to talk and lets go of the walker when she is in motion.  Gait Pattern: Step-to pattern;Decreased stance time - right;Decreased step length - right;Antalgic;Narrow base of support Gait velocity: decreased     PT Goals                                                               progressing    Visit Information  Last PT Received On: 10/14/12 Assistance Needed: +1    Subjective Data      Cognition       Balance     End of Session PT - End of Session Equipment Utilized During Treatment: Gait belt Activity Tolerance: Patient tolerated treatment well Patient left: in bed;with call bell/phone within reach;with bed alarm set   Felecia Shelling  PTA [facility]  Acute  Rehab Pager      [phone]

## 2012-10-14 NOTE — Progress Notes (Signed)
OT Cancellation Note  Patient Details Name: Debra Richards MRN: [id number] DOB: Dec 29, 1925   Cancelled Treatment:     Pt's plan is for STSNF.  Will defer OT eval to that venue.    Shimika Ames 10/14/2012, 9:23 AM Marica Otter, OTR/L [phone] 10/14/2012

## 2012-10-14 NOTE — Progress Notes (Signed)
Daughter called to check on status of mother this am.  Informed that mother appeared comfortable, not yelling out, awaiting breakfast.  States she will arrive approximately noon to visit

## 2012-10-14 NOTE — Progress Notes (Addendum)
PROGRESS NOTE  Debra Richards UXL:[id number] DOB: 1926-02-05 DOA: 10/12/2012 PCP: Ignatius Specking., MD  Brief narrative: 79 yr old CF admitted 10/12/12 with # R hip.  Underwent R Hip Hemi-arthroplasty 5/19 by Dr. Despina Hick  Past medical history-As per Problem list  Consultants:  Ortho [facility]  Procedures:  R THR 5/19  Antibiotics:  none   Subjective  Patient is intermittently confused. Though she knows she is in the hospital and had hip surgery.   Objective    Interim History: none  Telemetry: none  Objective: Filed Vitals:   10/13/12 2200 10/14/12 0710 10/14/12 1200 10/14/12 1416  BP: 113/65 119/59  102/61  Pulse: 78 87  68  Temp: 98.6 F (37 C) 99.3 F (37.4 C)  99.3 F (37.4 C)  TempSrc: Oral Oral  Oral  Resp: 18 20 20 16   Height:      Weight:      SpO2: 97% 97% 97% 97%    Intake/Output Summary (Last 24 hours) at 10/14/12 1444 Last data filed at 10/14/12 1416  Gross per 24 hour  Intake    300 ml  Output    550 ml  Net   -250 ml    Exam: like NSR  General: alert pleasantly confused Cardiovascular:  S1s2 RRR Respiratory:  Clear bilaterally Abdomen: soft, non tender, no otganomegaly Skin:nad-wound not visualized Neuro: grossly intact  Data Reviewed: Basic Metabolic Panel:  Recent Labs Lab 10/12/12 0200 10/12/12 1000 10/14/12 0508  NA 139 141 135  K 4.0 4.0 4.2  CL 102 104 100  CO2 28 30 29   GLUCOSE 123* 119* 118*  BUN 17 16 11   CREATININE 0.82 0.79 0.67  CALCIUM 10.0 9.3 8.8   Liver Function Tests:  Recent Labs Lab 10/12/12 1000  AST 125*  ALT 56*  ALKPHOS 72  BILITOT 0.7  PROT 5.7*  ALBUMIN 3.3*   No results found for this basename: LIPASE, AMYLASE,  in the last 168 hours No results found for this basename: AMMONIA,  in the last 168 hours CBC:  Recent Labs Lab 10/12/12 0200 10/12/12 1000 10/14/12 0508  WBC 9.2 5.9 8.2  NEUTROABS 7.8* 4.4  --   HGB 13.6 12.9 10.7*  HCT 39.8 37.1 33.0*  MCV 96.4 96.1 95.7  PLT  134* 122* 111*   Cardiac Enzymes: No results found for this basename: CKTOTAL, CKMB, CKMBINDEX, TROPONINI,  in the last 168 hours BNP: No components found with this basename: POCBNP,  CBG: No results found for this basename: GLUCAP,  in the last 168 hours  Recent Results (from the past 240 hour(s))  SURGICAL PCR SCREEN     Status: None   Collection Time    10/12/12 11:23 AM      Result Value Range Status   MRSA, PCR NEGATIVE  NEGATIVE Final   Staphylococcus aureus NEGATIVE  NEGATIVE Final   Comment:            The Xpert SA Assay (FDA     approved for NASAL specimens     in patients over 47 years of age),     is one component of     a comprehensive surveillance     program.  Test performance has     been validated by [employer] for patients greater     than or equal to 40 year old.     It is not intended     to diagnose infection nor to     guide  or monitor treatment.     Studies:              All Imaging reviewed and is as per above notation   Scheduled Meds: . atenolol  25 mg Oral Daily  . calcium-vitamin D  1 tablet Oral BID  . digoxin  62.5 mcg Oral Daily  . enoxaparin (LOVENOX) injection  40 mg Subcutaneous Q24H  . levothyroxine  75 mcg Oral QPM  . pantoprazole  20 mg Oral QAC breakfast   Continuous Infusions:     Assessment/Plan: 1. Right subcapital femoral fracture-per orthopedics Dr. Despina Hick.  S/p R THR 5/19.  Oscal-d BID.  Pain management, Weight bearing and Anti-coagulation per ortho 2. P A FIb on atenolol-not on Coumadin-continue atenolol 25 mg daily. Outpatient followup with Dr. Alanda Amass.  3. Elevated LFT's: Could be secondary to doxycycline, it has been held in the hospital, will hold the doxycycline at this time. 4. Rosacea-Will hold the doxycycline at this time 5. Potential CAD in the past, left bundle branch block present currently 6. Hypothyroidism- continue levothyroxine 75 mcg every afternoon 7. Rheumatoid arthritis-used to be on steroids at  4 years ago but discontinued them. Outpatient followup with Dr. Kellie Simmering. 8. Reflux-continue pantoprazole 20 mg every morning 9. Confusion- Spoke to the daughter who says she did have similar confusion last time she had surgery, and it got worse with Robaxin. Patient got robaxin yesterday and became confused.Will continue to monitor.   Code Status: Full Family Communication: Spoke with daughter Disposition: SNF    Mauro Kaufmann  [facility] Pager [phone] 10/14/2012, 2:44 PM    LOS: 2 days

## 2012-10-15 DIAGNOSIS — E039 Hypothyroidism, unspecified: Secondary | ICD-10-CM

## 2012-10-15 DIAGNOSIS — L719 Rosacea, unspecified: Secondary | ICD-10-CM

## 2012-10-15 DIAGNOSIS — K219 Gastro-esophageal reflux disease without esophagitis: Secondary | ICD-10-CM

## 2012-10-15 DIAGNOSIS — R4182 Altered mental status, unspecified: Secondary | ICD-10-CM

## 2012-10-15 LAB — CBC
HCT: 31.5 % — ABNORMAL LOW (ref 36.0–46.0)
MCHC: 34.6 g/dL (ref 30.0–36.0)
MCV: 95.7 fL (ref 78.0–100.0)
RDW: 13.5 % (ref 11.5–15.5)

## 2012-10-15 LAB — BASIC METABOLIC PANEL
BUN: 13 mg/dL (ref 6–23)
Calcium: 8.6 mg/dL (ref 8.4–10.5)
Creatinine, Ser: 0.7 mg/dL (ref 0.50–1.10)
GFR calc Af Amer: 90 mL/min — ABNORMAL LOW (ref >90)
GFR calc non Af Amer: 77 mL/min — ABNORMAL LOW (ref >90)
Potassium: 4 mEq/L (ref 3.5–5.1)

## 2012-10-15 LAB — URINE CULTURE

## 2012-10-15 MED ORDER — ASPIRIN EC 325 MG PO TBEC: 325.0000 mg | DELAYED_RELEASE_TABLET | Freq: Every day | ORAL | Status: AC

## 2012-10-15 MED ORDER — TRAMADOL HCL 50 MG PO TABS: 50.0000 mg | ORAL_TABLET | Freq: Four times a day (QID) | ORAL | Status: DC | PRN

## 2012-10-15 MED ORDER — ENOXAPARIN SODIUM 40 MG/0.4ML ~~LOC~~ SOLN: 40.0000 mg | SUBCUTANEOUS | Status: DC

## 2012-10-15 NOTE — Progress Notes (Signed)
Subjective: 3 Days Post-Op Procedure(s) (LRB): ARTHROPLASTY BIPOLAR HIP (Right) Patient reports pain as mild.   Patient seen in rounds by Dr. Lequita Halt.  She is clear and back to baseline today. Patient is improved from yesterday. Patient is ready to go the SNF of choice.  Objective: Vital signs in last 24 hours: Temp:  [98.5 F (36.9 C)-99.3 F (37.4 C)] 98.9 F (37.2 C) (05/22 0434) Pulse Rate:  [68-72] 72 (05/22 0434) Resp:  [14-20] 18 (05/22 0434) BP: (97-102)/(53-61) 97/53 mmHg (05/22 0434) SpO2:  [95 %-100 %] 96 % (05/22 0434)  Intake/Output from previous day:  Intake/Output Summary (Last 24 hours) at 10/15/12 0735 Last data filed at 10/15/12 0436  Gross per 24 hour  Intake    360 ml  Output    975 ml  Net   -615 ml    Intake/Output this shift:    Labs:  Recent Labs  10/12/12 1000 10/14/12 0508 10/15/12 0435  HGB 12.9 10.7* 10.9*    Recent Labs  10/14/12 0508 10/15/12 0435  WBC 8.2 7.6  RBC 3.45* 3.29*  HCT 33.0* 31.5*  PLT 111* 131*    Recent Labs  10/14/12 0508 10/15/12 0435  NA 135 134*  K 4.2 4.0  CL 100 100  CO2 29 28  BUN 11 13  CREATININE 0.67 0.70  GLUCOSE 118* 99  CALCIUM 8.8 8.6   No results found for this basename: LABPT, INR,  in the last 72 hours  EXAM: General - Patient is Alert, Appropriate and Oriented Extremity - Neurovascular intact Sensation intact distally Dorsiflexion/Plantar flexion intact No cellulitis present Incision - clean, dry, no drainage, healing Motor Function - intact, moving foot and toes well on exam.   Assessment/Plan: 3 Days Post-Op Procedure(s) (LRB): ARTHROPLASTY BIPOLAR HIP (Right) Procedure(s) (LRB): ARTHROPLASTY BIPOLAR HIP (Right) Past Medical History  Diagnosis Date  . History of kidney stones   . Rosacea   . MVP (mitral valve prolapse) MILD  . Right ureteral stone   . Coronary artery disease     CARDIOLOGIST- DR Alanda Amass-- LAST VISIT 04-04-11 (will request note)---   (per note  cardiolite 2009 negative,  echo jan.2010  . GERD (gastroesophageal reflux disease)     CONTROLLED W/ ACIPHEX AND prn prevacid  . Edema occasional in ankles  . Hypothyroidism   . Dysrhythmia     HX SVT AND PAF--- occ. palpitations  . LBBB (left bundle branch block) CHRONIC  . H/O hiatal hernia   . RA (rheumatoid arthritis)     followed by dr Kellie Simmering  . Atrial fibrillation    Principal Problem:   Hip fracture, right Active Problems:   ABDOMINAL PAIN RIGHT LOWER QUADRANT   MVP (mitral valve prolapse)   Coronary artery disease   RA (rheumatoid arthritis)   Dysrhythmia   Rosacea   LBBB (left bundle branch block)   Hypothyroidism   Altered mental status  Estimated body mass index is 25 kg/(m^2) as calculated from the following:   Height as of this encounter: 5' (1.524 m).   Weight as of this encounter: 58.06 kg (128 lb). Up with therapy today Diet - Cardiac diet Follow up - in 2 weeks with Dr. Lequita Halt Activity - WBAT Disposition - Skilled nursing facility as per Medicine Service Condition Upon Discharge - Stable D/C Meds - See DC Summary DVT Prophylaxis - Lovenox for a total of 10 days (7 more days at this point) and then 325 mg ASA Daily  Leniyah Martell 10/15/2012, 7:35 AM

## 2012-10-15 NOTE — Progress Notes (Signed)
Pt has SNF bed at [facility] today if pt is stable for d/c. CSW will assist with d/c planning to SNF.  Cori Razor LCSW [phone]

## 2012-10-15 NOTE — Discharge Summary (Addendum)
Physician Discharge Summary  Debra Richards:[id number] DOB: 10/25/1925 DOA: 10/12/2012  PCP: Ignatius Specking., MD  Admit date: 10/12/2012 Discharge date: 10/15/2012  Time spent: 50* minutes  Recommendations for Outpatient Follow-up:  1. Follow up Ortho as outpatient, call to make appointment  2. Repeat LFT's in two weeks   Discharge Diagnoses:  Principal Problem:   Hip fracture, right Active Problems:   ABDOMINAL PAIN RIGHT LOWER QUADRANT   MVP (mitral valve prolapse)   Coronary artery disease   RA (rheumatoid arthritis)   Dysrhythmia   Rosacea   LBBB (left bundle branch block)   Hypothyroidism   Altered mental status   Discharge Condition: Stable  Diet recommendation: Low salt, heart healthy diet  Filed Weights   10/12/12 0028  Weight: 58.06 kg (128 lb)    History of present illness:  y.o. Female who presents with the chief complaint of right hip pain. She reports that last evening around 1800 he was getting out of a chair at home when she fell onto her right side. She recalls hitting her right hip and right knee. She denies LOC or dizziness before the fall. She reports that she was able to ambulate that evening enough to get ready for bed. She continued to have pain despite resting in her bed. She then decided to present to the ED at [facility] as she could no longer ambulate due to the pain. She presented to the ED at [facility] around 2130. X-rays of the right hip revealed a right subcapital femoral neck fracture. She has a history of a right total knee arthroplasty in October 2013. She reports that she has been doing well in regards to that. She does have some discomfort over her right knee where she fell on it but reports that the hip is significantly more painful. She was transported to [facility] this morning to be evaluated and undergo surgical treatment for the right hip fracture by Dr. Ester Rink Course:  1. Right subcapital femoral fracture-per  orthopedics Dr. Despina Hick. S/p R THR 5/19. Oscal-d BID. Pain management, 2. Paroxysmal atrial fibrillation On atenolol-not on Coumadin-continue atenolol 25 mg daily. Outpatient followup with Dr. Alanda Amass.  Elevated LFT's: Could be secondary to doxycycline, it has been held in the hospital,take doxycycline only if needed for Rosacea. Should get repeat LFT's in 2 weeks Rosacea- Continue prn doxycycline 3. Potential CAD in the past, left bundle branch block present currently 4. Hypothyroidism- continue levothyroxine 75 mcg every afternoon 5. Rheumatoid arthritis-used to be on steroids at 4 years ago but discontinued them. Outpatient followup with Dr. Kellie Simmering. 6. Reflux-continue pantoprazole 20 mg every morning 7. Confusion- Spoke to the daughter who says she did have similar confusion last time she had surgery, and it got worse with Robaxin. Patient got robaxin yesterday and became confused.Will continue to monitor. 8. DVT prophylaxis- lovenox daily for seven more days then change to aspirin 325 mg po daily for four weeks     Procedures: S/p ARTHROPLASTY BIPOLAR HIP (Right)    Consultations:  orthopedics  Discharge Exam: Filed Vitals:   10/14/12 1416 10/14/12 1516 10/14/12 2025 10/15/12 0434  BP: 102/61  99/57 97/53  Pulse: 68  69 72  Temp: 99.3 F (37.4 C)  98.5 F (36.9 C) 98.9 F (37.2 C)  TempSrc: Oral  Oral Oral  Resp: 16 14 16 18   Height:      Weight:      SpO2: 97% 95% 100% 96%    General: Appear  in no acute distress Cardiovascular: S1s2 RRR Respiratory: Clear bilaterally Ext : No edema  Discharge Instructions  Discharge Orders   Future Orders Complete By Expires     Call MD / Call 911  As directed     Comments:      If you experience chest pain or shortness of breath, CALL 911 and be transported to the hospital emergency room.  If you develope a fever above 101 F, pus (white drainage) or increased drainage or redness at the wound, or calf pain, call your surgeon's  office.    Change dressing  As directed     Comments:      You may change your dressing dressing daily with sterile 4 x 4 inch gauze dressing and paper tape.  Do not submerge the incision under water.    Constipation Prevention  As directed     Comments:      Drink plenty of fluids.  Prune juice may be helpful.  You may use a stool softener, such as Colace (over the counter) 100 mg twice a day.  Use MiraLax (over the counter) for constipation as needed.    Diet - low sodium heart healthy  As directed     Diet - low sodium heart healthy  As directed     Discharge instructions  As directed     Comments:      Pick up stool softner and laxative for home. Do not submerge incision under water. May shower. Continue to use ice for pain and swelling from surgery. Hip precautions.   Take Lovenox injections for seven more days and the stop and switch to a 325 mg Aspirin daily for four more weeks.    Do not sit on low chairs, stoools or toilet seats, as it may be difficult to get up from low surfaces  As directed     Driving restrictions  As directed     Comments:      No driving until released by the physician.    Follow the hip precautions as taught in Physical Therapy  As directed     Increase activity slowly as tolerated  As directed     Increase activity slowly  As directed     Lifting restrictions  As directed     Comments:      No lifting until released by the physician.    Patient may shower  As directed     Comments:      You may shower without a dressing once there is no drainage.  Do not wash over the wound.  If drainage remains, do not shower until drainage stops.    TED hose  As directed     Comments:      Use stockings (TED hose) for 3 weeks on both leg(s).  You may remove them at night for sleeping.    Weight bearing as tolerated  As directed     Weight bearing as tolerated  As directed         Medication List    STOP taking these medications        HYDROcodone-acetaminophen 5-325 MG per tablet  Commonly known as:  NORCO/VICODIN     HYDROcodone-acetaminophen 5-500 MG per tablet  Commonly known as:  VICODIN      TAKE these medications       aspirin EC 325 MG tablet  Take 1 tablet (325 mg total) by mouth daily.  Start taking on:  10/22/2012  clorazepate 7.5 MG tablet  Commonly known as:  TRANXENE  Take 7.5 mg by mouth 2 (two) times daily as needed. sleep     digoxin 0.125 MG tablet  Commonly known as:  LANOXIN  Take 62.5 mcg by mouth every morning.     doxycycline 100 MG capsule  Commonly known as:  VIBRAMYCIN  Take 100 mg by mouth daily as needed. rosacea     enoxaparin 40 MG/0.4ML injection  Commonly known as:  LOVENOX  Inject 0.4 mLs (40 mg total) into the skin daily. Take Lovenox injections for seven more days, then may discontinue. After stopping the Lovenox, start a 325 mg aspirin daily for four more weeks.     levothyroxine 75 MCG tablet  Commonly known as:  SYNTHROID, LEVOTHROID  Take 75 mcg by mouth every evening.     pantoprazole 20 MG tablet  Commonly known as:  PROTONIX  Take 20 mg by mouth every morning.     TENORMIN PO  Take 12.5 mg by mouth daily.     traMADol 50 MG tablet  Commonly known as:  ULTRAM  Take 1-2 tablets (50-100 mg total) by mouth every 6 (six) hours as needed for pain.      ASK your doctor about these medications       CALAZIME SKIN PROTECTANT EX  Apply topically.       Allergies  Allergen Reactions  . Penicillins Hives       Follow-up Information   Follow up with Loanne Drilling, MD. Schedule an appointment as soon as possible for a visit on 10/29/2012.   Contact information:   [address] [place] [address] [phone]        The results of significant diagnostics from this hospitalization (including imaging, microbiology, ancillary and laboratory) are listed below for reference.    Significant Diagnostic  Studies: Dg Hip Complete Right  10/12/2012   *RADIOLOGY REPORT*  Clinical Data: Fall.  Right hip injury and pain.  RIGHT HIP - COMPLETE 2+ VIEW  Comparison: None.  Findings: A subcapital right femoral neck fracture is seen with varus angulation.  No evidence of dislocation.  No pelvic fracture identified.  IMPRESSION: Subcapital right femoral neck fracture, with varus angulation.   Original Report Authenticated By: Myles Rosenthal, M.D.   Dg Pelvis Portable  10/12/2012   *RADIOLOGY REPORT*  Clinical Data: Postop right hip arthroplasty.  PORTABLE PELVIS  Comparison: 10/12/2012.  Findings: There is a well seated bipolar right hip prosthesis.  No complicating features are demonstrated.  IMPRESSION: Bipolar right hip prosthesis in good position without complicating features.   Original Report Authenticated By: Rudie Meyer, M.D.   Dg Chest Portable 1 View  10/12/2012   *RADIOLOGY REPORT*  Clinical Data: Larey Seat. Right hip fracture.  PORTABLE CHEST - 1 VIEW  Comparison: 03/25/2012.  Findings: The cardiac silhouette, mediastinal and hilar contours are normal and stable.  There are chronic lung changes with apical scarring.  No acute pulmonary findings.  No pleural effusion.  The bony thorax is intact.  IMPRESSION: Chronic lung changes but no acute pulmonary findings.   Original Report Authenticated By: Rudie Meyer, M.D.    Microbiology: Recent Results (from the past 240 hour(s))  SURGICAL PCR SCREEN     Status: None   Collection Time    10/12/12 11:23 AM      Result Value Range Status   MRSA, PCR NEGATIVE  NEGATIVE Final   Staphylococcus aureus NEGATIVE  NEGATIVE  Final   Comment:            The Xpert SA Assay (FDA     approved for NASAL specimens     in patients over 33 years of age),     is one component of     a comprehensive surveillance     program.  Test performance has     been validated by [employer] for patients greater     than or equal to 32 year old.     It is not intended     to  diagnose infection nor to     guide or monitor treatment.     Labs: Basic Metabolic Panel:  Recent Labs Lab 10/12/12 0200 10/12/12 1000 10/14/12 0508 10/15/12 0435  NA 139 141 135 134*  K 4.0 4.0 4.2 4.0  CL 102 104 100 100  CO2 28 30 29 28   GLUCOSE 123* 119* 118* 99  BUN 17 16 11 13   CREATININE 0.82 0.79 0.67 0.70  CALCIUM 10.0 9.3 8.8 8.6   Liver Function Tests:  Recent Labs Lab 10/12/12 1000  AST 125*  ALT 56*  ALKPHOS 72  BILITOT 0.7  PROT 5.7*  ALBUMIN 3.3*   No results found for this basename: LIPASE, AMYLASE,  in the last 168 hours No results found for this basename: AMMONIA,  in the last 168 hours CBC:  Recent Labs Lab 10/12/12 0200 10/12/12 1000 10/14/12 0508 10/15/12 0435  WBC 9.2 5.9 8.2 7.6  NEUTROABS 7.8* 4.4  --   --   HGB 13.6 12.9 10.7* 10.9*  HCT 39.8 37.1 33.0* 31.5*  MCV 96.4 96.1 95.7 95.7  PLT 134* 122* 111* 131*   Cardiac Enzymes: No results found for this basename: CKTOTAL, CKMB, CKMBINDEX, TROPONINI,  in the last 168 hours BNP: BNP (last 3 results) No results found for this basename: PROBNP,  in the last 8760 hours CBG: No results found for this basename: GLUCAP,  in the last 168 hours     Signed:  Oleg Oleson S  [facility] 10/15/2012, 9:59 AM

## 2012-10-15 NOTE — Progress Notes (Signed)
Physical Therapy Treatment Patient Details Name: Debra Richards MRN: [id number] DOB: 04-03-1926 Today's Date: 10/15/2012 Time: 8657-8469 PT Time Calculation (min): 27 min  PT Assessment / Plan / Recommendation Comments on Treatment Session  Progressing well. Plan is for SNF today.     Follow Up Recommendations  SNF     Does the patient have the potential to tolerate intense rehabilitation     Barriers to Discharge        Equipment Recommendations  None recommended by PT    Recommendations for Other Services    Frequency Min 6X/week   Plan Discharge plan remains appropriate    Precautions / Restrictions Precautions Precautions: Fall;Posterior Hip Precaution Comments: Verbally reviewed and demonstrated hip precautions. Pt unable to recall any of them. Will require repeated cueing.  Restrictions Weight Bearing Restrictions: No RLE Weight Bearing: Weight bearing as tolerated   Pertinent Vitals/Pain R hip/knee-unrated    Mobility  Bed Mobility Bed Mobility: Supine to Sit;Sit to Supine Supine to Sit: 3: Mod assist Sit to Supine: 3: Mod assist Details for Bed Mobility Assistance: Assist for Les off/onto bed. Multimodal cues for safety, adherence to precautions Transfers Transfers: Sit to Stand;Stand to Sit Sit to Stand: 3: Mod assist;From bed Stand to Sit: 4: Min assist;To bed Details for Transfer Assistance: Multimodal cues for safety, technique, hand placement. assist to rise, stabilize, control descent.  Ambulation/Gait Ambulation/Gait Assistance: 4: Min assist Ambulation Distance (Feet): 75 Feet Assistive device: Rolling walker Ambulation/Gait Assistance Details: Multimodale cues for safety, sequence, distance from RW. assist to stabilize pt and maneuver with RW. Pt has difficulty with attention to task.  Gait Pattern: Step-to pattern;Step-through pattern;Narrow base of support;Antalgic;Decreased stride length    Exercises Total Joint Exercises Ankle  Circles/Pumps: AROM;Both;10 reps;Supine Quad Sets: AROM;Both;10 reps;Supine Heel Slides: AAROM;Right;10 reps;Supine Hip ABduction/ADduction: AAROM;Right;10 reps;Supine   PT Diagnosis:    PT Problem List:   PT Treatment Interventions:     PT Goals Acute Rehab PT Goals Pt will go Supine/Side to Sit: with supervision PT Goal: Supine/Side to Sit - Progress: Progressing toward goal Pt will go Sit to Supine/Side: with supervision PT Goal: Sit to Supine/Side - Progress: Progressing toward goal Pt will go Sit to Stand: with supervision PT Goal: Sit to Stand - Progress: Progressing toward goal Pt will go Stand to Sit: with supervision PT Goal: Stand to Sit - Progress: Progressing toward goal Pt will Ambulate: 51 - 150 feet;with supervision;with least restrictive assistive device PT Goal: Ambulate - Progress: Progressing toward goal  Visit Information  Last PT Received On: 10/15/12 Assistance Needed: +1    Subjective Data  Subjective: Im just getting tired of sitting here Patient Stated Goal: rehab for a few days   Cognition  Cognition Arousal/Alertness: Awake/alert Behavior During Therapy: WFL for tasks assessed/performed Overall Cognitive Status: Impaired/Different from baseline Area of Impairment: Memory Memory: Decreased short-term memory    Balance     End of Session PT - End of Session Activity Tolerance: Patient tolerated treatment well Patient left: with call bell/phone within reach;with bed alarm set   GP     Rebeca Alert, MPT Pager: [phone]

## 2012-10-15 NOTE — Progress Notes (Signed)
Clinical Social Work Department CLINICAL SOCIAL WORK PLACEMENT NOTE 10/15/2012  Patient:  Debra Richards, Debra Richards  Account Number:  [id number] Admit date:  10/12/2012  Clinical Social Worker:  Cori Razor, LCSW  Date/time:  10/13/2012 11:25 AM  Clinical Social Work is seeking post-discharge placement for this patient at the following level of care:   SKILLED NURSING   (*CSW will update this form in Epic as items are completed)   10/13/2012  Patient/family provided with [facility] Department of Clinical Social Work's list of facilities offering this level of care within the geographic area requested by the patient (or if unable, by the patient's family).  10/13/2012  Patient/family informed of their freedom to choose among providers that offer the needed level of care, that participate in Medicare, Medicaid or managed care program needed by the patient, have an available bed and are willing to accept the patient.  10/13/2012  Patient/family informed of [facility]' ownership interest in [facility], as well as of the fact that they are under no obligation to receive care at this facility.  PASARR submitted to EDS on 10/12/2012 PASARR number received from EDS on 10/12/2012  FL2 transmitted to all facilities in geographic area requested by pt/family on  10/13/2012 FL2 transmitted to all facilities within larger geographic area on   Patient informed that his/her managed care company has contracts with or will negotiate with  certain facilities, including the following:     Patient/family informed of bed offers received:  10/14/2012 Patient chooses bed at [facility] Physician recommends and patient chooses bed at    Patient to be transferred to [facility] on  10/15/2012 Patient to be transferred to facility by [facility]  The following physician request were entered in Epic:   Additional Comments:  Cori Razor LCSW [phone]

## 2012-12-15 ENCOUNTER — Encounter: Payer: Self-pay | Admitting: Gynecology

## 2012-12-15 ENCOUNTER — Ambulatory Visit (INDEPENDENT_AMBULATORY_CARE_PROVIDER_SITE_OTHER): Payer: Medicare Other | Admitting: Gynecology

## 2012-12-15 DIAGNOSIS — R319 Hematuria, unspecified: Secondary | ICD-10-CM

## 2012-12-15 DIAGNOSIS — N200 Calculus of kidney: Secondary | ICD-10-CM

## 2012-12-15 LAB — URINALYSIS W MICROSCOPIC + REFLEX CULTURE
Crystals: NONE SEEN
Ketones, ur: NEGATIVE mg/dL
Nitrite: NEGATIVE
Specific Gravity, Urine: 1.02 (ref 1.005–1.030)
Urobilinogen, UA: 0.2 mg/dL (ref 0.0–1.0)

## 2012-12-15 NOTE — Progress Notes (Signed)
Patient presents with a several day history of gross hematuria now resolving. No real pain, frequency urgency dysuria fevers or chills. Absolutely sure that his urinary and not vaginal or rectal. History of renal lithiasis with multiple stones and hematuria in the past. Recently saw Dr. Patsi Sears earlier this year.  Exam Spine straight without CVA tenderness Abdomen soft nontender without masses guarding rebound organomegaly.  Urinalysis with TNTC RBC rare bacteria 0-2 WBC.  Assessment and plan: Probable renal lithiasis. We'll check culture rule out infectious hemorrhagic cystitis. Recommended patient call [facility] to make an appointment to followup for the hematuria. Will treat if culture positive. Patient is due for exam and will make an appointment to see me in one month for pelvic exam.

## 2012-12-15 NOTE — Patient Instructions (Addendum)
Call Dr. Imelda Pillow office and make an appointment to followup for the blood in your urine., Followup with me in one month for GYN exam.

## 2012-12-17 LAB — URINE CULTURE: Colony Count: 80000

## 2013-01-06 ENCOUNTER — Encounter: Payer: Self-pay | Admitting: Cardiovascular Disease

## 2013-01-20 ENCOUNTER — Encounter: Payer: Self-pay | Admitting: Gynecology

## 2013-01-20 ENCOUNTER — Ambulatory Visit (INDEPENDENT_AMBULATORY_CARE_PROVIDER_SITE_OTHER): Payer: Medicare Other | Admitting: Gynecology

## 2013-01-20 VITALS — BP 124/84 | Ht 65.0 in | Wt 130.0 lb

## 2013-01-20 DIAGNOSIS — N952 Postmenopausal atrophic vaginitis: Secondary | ICD-10-CM

## 2013-01-20 DIAGNOSIS — N83209 Unspecified ovarian cyst, unspecified side: Secondary | ICD-10-CM

## 2013-01-20 DIAGNOSIS — M810 Age-related osteoporosis without current pathological fracture: Secondary | ICD-10-CM

## 2013-01-20 NOTE — Patient Instructions (Signed)
Office will contact you to arrange for the Prolia injections that he will get every 6 months for your osteoporosis. Followup for reexamination in one year.

## 2013-01-20 NOTE — Progress Notes (Signed)
Keiran Sias Brisco-Cook 1926-03-02 [id number]        79 y.o.  [id number] for followup exam.  Former patient of Dr. Eda Paschal. Several issues noted below.  Past medical history,surgical history, medications, allergies, family history and social history were all reviewed and documented in the EPIC chart.  ROS:  Performed and pertinent positives and negatives are included in the history, assessment and plan .  Exam: Kim assistant Filed Vitals:   01/20/13 1448  BP: 124/84  Height: 5\' 5"  (1.651 m)  Weight: 130 lb (58.968 kg)   General appearance  Normal Skin grossly normal Head/Neck normal with no cervical or supraclavicular adenopathy thyroid normal Lungs  clear Cardiac RR, without RMG Abdominal  soft, nontender, without masses, organomegaly or hernia Breasts  examined lying and sitting without masses, retractions, discharge or axillary adenopathy. Pelvic  Ext/BUS/vagina  normal with atrophic changes  Adnexa  Without masses or tenderness    Anus and perineum  normal   Rectovaginal  normal sphincter tone without palpated masses or tenderness.    Assessment/Plan:  79 y.o. [id number] female for followup exam.   1. Osteoporosis. DEXA 12/2016 with T score -2.7. Dr. Eda Paschal had talked treatment with the patient per his 01/22/2012 note. She's had a fractured hip and femur in the past. She does have rheumatoid arthritis. Reviewed her significant risk of future fractures and my recommendation to begin treatment. Has difficulty swallowing pills and cannot tolerate most oral medications. Dr. Eda Paschal had spoken to her about Prolia which I think is certainly a good idea. I reviewed the risks with her to include osteonecrosis of the jaw, atypical fractures, rashes and increased risks of infection. After lengthy discussion the patient is interested in initiating and we'll go ahead and make arrangements for this. 2. Atrophic genital changes. Had used Estrace cream in the past but has discontinued this and  is doing well without the cream. No significant vaginal dryness. We'll follow expectantly. 3. Hematuria. Intermittent bouts of hematuria that she's actively being worked up by Dr. Patsi Sears now for. Has renal CT scheduled this coming week. We'll continue to follow up with him. Does have history of renal lithiasis. 4. Mammography 02/2012. Will repeat this coming October. SBE monthly review. 5. Pap smear 2011. No Pap smear done today. No history of significant abnormal Pap smears. Status post vaginal hysterectomy for benign indications. At the age of 46. We both agreed to stop screening and she is comfortable with this. 6. Colonoscopy 2011. Repeat at [facility] recommended interval. 7. Health maintenance. No lab work done as it is done through her other physician's offices. Followup for initiation of Prolia otherwise 1 year.  Note: This document was prepared with digital dictation and possible smart phrase technology. Any transcriptional errors that result from this process are unintentional.   Dara Lords MD, 3:26 PM 01/20/2013

## 2013-02-17 ENCOUNTER — Telehealth: Payer: Self-pay | Admitting: *Deleted

## 2013-02-17 NOTE — Telephone Encounter (Signed)
Pt was called per TF regarding Prolia. Medicare covers it $0 plus any deductible which has been met. PT does want injection and will call back in October to schedule, she is waiting for another apt that brings her to [place]. KW

## 2013-02-28 ENCOUNTER — Emergency Department (HOSPITAL_COMMUNITY): Payer: Medicare Other

## 2013-02-28 ENCOUNTER — Inpatient Hospital Stay (HOSPITAL_COMMUNITY)
Admission: EM | Admit: 2013-02-28 | Discharge: 2013-03-04 | DRG: 689 | Disposition: A | Discharge status: Home / Self Care | Payer: Medicare Other | Attending: Internal Medicine | Admitting: Internal Medicine

## 2013-02-28 ENCOUNTER — Encounter (HOSPITAL_COMMUNITY): Payer: Self-pay | Admitting: Emergency Medicine

## 2013-02-28 DIAGNOSIS — R5381 Other malaise: Secondary | ICD-10-CM

## 2013-02-28 DIAGNOSIS — I4891 Unspecified atrial fibrillation: Secondary | ICD-10-CM | POA: Diagnosis present

## 2013-02-28 DIAGNOSIS — D696 Thrombocytopenia, unspecified: Secondary | ICD-10-CM | POA: Diagnosis present

## 2013-02-28 DIAGNOSIS — Z87440 Personal history of urinary (tract) infections: Secondary | ICD-10-CM

## 2013-02-28 DIAGNOSIS — N390 Urinary tract infection, site not specified: Secondary | ICD-10-CM | POA: Diagnosis present

## 2013-02-28 DIAGNOSIS — B952 Enterococcus as the cause of diseases classified elsewhere: Secondary | ICD-10-CM | POA: Diagnosis present

## 2013-02-28 DIAGNOSIS — I447 Left bundle-branch block, unspecified: Secondary | ICD-10-CM | POA: Diagnosis present

## 2013-02-28 DIAGNOSIS — K219 Gastro-esophageal reflux disease without esophagitis: Secondary | ICD-10-CM | POA: Diagnosis present

## 2013-02-28 DIAGNOSIS — Z79899 Other long term (current) drug therapy: Secondary | ICD-10-CM

## 2013-02-28 DIAGNOSIS — Z96649 Presence of unspecified artificial hip joint: Secondary | ICD-10-CM

## 2013-02-28 DIAGNOSIS — Z823 Family history of stroke: Secondary | ICD-10-CM

## 2013-02-28 DIAGNOSIS — R7881 Bacteremia: Secondary | ICD-10-CM | POA: Diagnosis present

## 2013-02-28 DIAGNOSIS — Z7982 Long term (current) use of aspirin: Secondary | ICD-10-CM

## 2013-02-28 DIAGNOSIS — E039 Hypothyroidism, unspecified: Secondary | ICD-10-CM | POA: Diagnosis present

## 2013-02-28 DIAGNOSIS — Z8249 Family history of ischemic heart disease and other diseases of the circulatory system: Secondary | ICD-10-CM

## 2013-02-28 DIAGNOSIS — I959 Hypotension, unspecified: Secondary | ICD-10-CM | POA: Diagnosis present

## 2013-02-28 DIAGNOSIS — M069 Rheumatoid arthritis, unspecified: Secondary | ICD-10-CM | POA: Diagnosis present

## 2013-02-28 DIAGNOSIS — I059 Rheumatic mitral valve disease, unspecified: Secondary | ICD-10-CM | POA: Diagnosis present

## 2013-02-28 DIAGNOSIS — N179 Acute kidney failure, unspecified: Secondary | ICD-10-CM | POA: Diagnosis present

## 2013-02-28 DIAGNOSIS — E860 Dehydration: Secondary | ICD-10-CM | POA: Diagnosis present

## 2013-02-28 DIAGNOSIS — Z96659 Presence of unspecified artificial knee joint: Secondary | ICD-10-CM

## 2013-02-28 DIAGNOSIS — R531 Weakness: Secondary | ICD-10-CM | POA: Diagnosis present

## 2013-02-28 DIAGNOSIS — G9340 Encephalopathy, unspecified: Secondary | ICD-10-CM | POA: Diagnosis present

## 2013-02-28 DIAGNOSIS — E876 Hypokalemia: Secondary | ICD-10-CM

## 2013-02-28 DIAGNOSIS — Z880 Allergy status to penicillin: Secondary | ICD-10-CM

## 2013-02-28 DIAGNOSIS — I2510 Atherosclerotic heart disease of native coronary artery without angina pectoris: Secondary | ICD-10-CM | POA: Diagnosis present

## 2013-02-28 DIAGNOSIS — Z87442 Personal history of urinary calculi: Secondary | ICD-10-CM

## 2013-02-28 DIAGNOSIS — Z8041 Family history of malignant neoplasm of ovary: Secondary | ICD-10-CM

## 2013-02-28 DIAGNOSIS — M810 Age-related osteoporosis without current pathological fracture: Secondary | ICD-10-CM | POA: Diagnosis present

## 2013-02-28 LAB — CBC WITH DIFFERENTIAL/PLATELET
Basophils Absolute: 0 10*3/uL (ref 0.0–0.1)
Basophils Relative: 0 % (ref 0–1)
Eosinophils Relative: 0 % (ref 0–5)
Lymphocytes Relative: 9 % — ABNORMAL LOW (ref 12–46)
MCHC: 34.4 g/dL (ref 30.0–36.0)
MCV: 93.3 fL (ref 78.0–100.0)
Neutro Abs: 6.1 10*3/uL (ref 1.7–7.7)
Neutrophils Relative %: 80 % — ABNORMAL HIGH (ref 43–77)
Platelets: 137 10*3/uL — ABNORMAL LOW (ref 150–400)
RBC: 3.89 MIL/uL (ref 3.87–5.11)
RDW: 13.3 % (ref 11.5–15.5)
WBC: 7.7 10*3/uL (ref 4.0–10.5)

## 2013-02-28 LAB — COMPREHENSIVE METABOLIC PANEL
ALT: 11 U/L (ref 0–35)
AST: 23 U/L (ref 0–37)
Albumin: 3.3 g/dL — ABNORMAL LOW (ref 3.5–5.2)
Alkaline Phosphatase: 62 U/L (ref 39–117)
CO2: 24 mEq/L (ref 19–32)
Calcium: 9.4 mg/dL (ref 8.4–10.5)
Chloride: 98 mEq/L (ref 96–112)
Creatinine, Ser: 1.14 mg/dL — ABNORMAL HIGH (ref 0.50–1.10)
GFR calc non Af Amer: 43 mL/min — ABNORMAL LOW (ref >90)
Potassium: 3.7 mEq/L (ref 3.5–5.1)
Sodium: 135 mEq/L (ref 135–145)
Total Bilirubin: 0.5 mg/dL (ref 0.3–1.2)
Total Protein: 6.7 g/dL (ref 6.0–8.3)

## 2013-02-28 LAB — URINALYSIS, ROUTINE W REFLEX MICROSCOPIC
Bilirubin Urine: NEGATIVE
Protein, ur: NEGATIVE mg/dL
Specific Gravity, Urine: 1.02 (ref 1.005–1.030)
Urobilinogen, UA: 0.2 mg/dL (ref 0.0–1.0)

## 2013-02-28 LAB — URINE MICROSCOPIC-ADD ON

## 2013-02-28 LAB — TROPONIN I: Troponin I: <0.3 ng/mL (ref <0.30)

## 2013-02-28 MED ORDER — LEVOTHYROXINE SODIUM 75 MCG PO TABS
75.0000 ug | ORAL_TABLET | Freq: Every evening | ORAL | Status: DC
  Administered 2013-02-28 – 2013-03-03 (×4): 75 ug via ORAL
  Filled 2013-02-28 (×4): qty 1

## 2013-02-28 MED ORDER — ENOXAPARIN SODIUM 40 MG/0.4ML ~~LOC~~ SOLN
40.0000 mg | SUBCUTANEOUS | Status: DC
  Administered 2013-02-28 – 2013-03-03 (×4): 40 mg via SUBCUTANEOUS
  Filled 2013-02-28 (×4): qty 0.4

## 2013-02-28 MED ORDER — CIPROFLOXACIN IN D5W 400 MG/200ML IV SOLN
400.0000 mg | Freq: Once | INTRAVENOUS | Status: AC
  Administered 2013-02-28: 400 mg via INTRAVENOUS
  Filled 2013-02-28: qty 200

## 2013-02-28 MED ORDER — INFLUENZA VAC SPLIT QUAD 0.5 ML IM SUSP
0.5000 mL | INTRAMUSCULAR | Status: AC
  Administered 2013-03-01: 0.5 mL via INTRAMUSCULAR
  Filled 2013-02-28: qty 0.5

## 2013-02-28 MED ORDER — ACETAMINOPHEN 650 MG RE SUPP: 650.0000 mg | Freq: Four times a day (QID) | RECTAL | Status: DC | PRN

## 2013-02-28 MED ORDER — SODIUM CHLORIDE 0.9 % IV BOLUS (SEPSIS)
1000.0000 mL | Freq: Once | INTRAVENOUS | Status: AC
  Administered 2013-02-28: 1000 mL via INTRAVENOUS

## 2013-02-28 MED ORDER — SODIUM CHLORIDE 0.9 % IJ SOLN
3.0000 mL | Freq: Two times a day (BID) | INTRAMUSCULAR | Status: DC
  Administered 2013-02-28 – 2013-03-03 (×5): 3 mL via INTRAVENOUS

## 2013-02-28 MED ORDER — SODIUM CHLORIDE 0.9 % IV SOLN
INTRAVENOUS | Status: DC
  Administered 2013-02-28: 19:00:00 via INTRAVENOUS

## 2013-02-28 MED ORDER — ACETAMINOPHEN 325 MG PO TABS
650.0000 mg | ORAL_TABLET | Freq: Four times a day (QID) | ORAL | Status: DC | PRN
  Administered 2013-02-28 – 2013-03-04 (×6): 650 mg via ORAL
  Filled 2013-02-28 (×6): qty 2

## 2013-02-28 MED ORDER — CEFTRIAXONE SODIUM 1 G IJ SOLR
1.0000 g | INTRAMUSCULAR | Status: DC
  Administered 2013-03-01: 1 g via INTRAVENOUS
  Filled 2013-02-28 (×2): qty 10

## 2013-02-28 MED ORDER — SODIUM CHLORIDE 0.9 % IV BOLUS (SEPSIS)
500.0000 mL | Freq: Once | INTRAVENOUS | Status: AC
  Administered 2013-02-28: 500 mL via INTRAVENOUS

## 2013-02-28 MED ORDER — PNEUMOCOCCAL VAC POLYVALENT 25 MCG/0.5ML IJ INJ
0.5000 mL | INJECTION | INTRAMUSCULAR | Status: AC
  Filled 2013-02-28: qty 0.5

## 2013-02-28 MED ORDER — ONDANSETRON HCL 4 MG PO TABS: 4.0000 mg | ORAL_TABLET | Freq: Four times a day (QID) | ORAL | Status: DC | PRN

## 2013-02-28 MED ORDER — ASPIRIN EC 81 MG PO TBEC
81.0000 mg | DELAYED_RELEASE_TABLET | Freq: Every morning | ORAL | Status: DC
  Administered 2013-03-01 – 2013-03-04 (×4): 81 mg via ORAL
  Filled 2013-02-28 (×4): qty 1

## 2013-02-28 MED ORDER — DIGOXIN 125 MCG PO TABS
0.0625 mg | ORAL_TABLET | Freq: Every morning | ORAL | Status: DC
  Administered 2013-03-01 – 2013-03-04 (×4): 0.0625 mg via ORAL
  Filled 2013-02-28 (×4): qty 1

## 2013-02-28 MED ORDER — ONDANSETRON HCL 4 MG/2ML IJ SOLN: 4.0000 mg | Freq: Four times a day (QID) | INTRAMUSCULAR | Status: DC | PRN

## 2013-02-28 MED ORDER — PANTOPRAZOLE SODIUM 20 MG PO TBEC
20.0000 mg | DELAYED_RELEASE_TABLET | Freq: Every morning | ORAL | Status: DC
  Filled 2013-02-28: qty 1

## 2013-02-28 NOTE — ED Provider Notes (Addendum)
CSN: [id number]     Arrival date & time 02/28/13  1316 History  This chart was scribed for Benny Lennert, MD by Dorothey Baseman, ED Scribe. This patient was seen in room APA12/APA12 and the patient's care was started at 2:05 PM.    Chief Complaint  Patient presents with  . Altered Mental Status  . Dizziness  . Weakness   Patient is a 79 y.o. female presenting with weakness. The history is provided by the patient. No language interpreter was used.  Weakness This is a new problem. The current episode started 2 days ago. The problem occurs constantly. The problem has not changed since onset.Associated symptoms include headaches. Pertinent negatives include no chest pain. Nothing aggravates the symptoms. Nothing relieves the symptoms. She has tried nothing for the symptoms. The treatment provided no relief.   HPI Comments: Debra Richards is a 79 y.o. female who presents to the Emergency Department complaining of headache and diffuse weakness onset 2 days ago that she states is currently improving. She states that she took a Meclizine at home with mild, temporary relief. She denies any dizziness. Her daughter reports abnormal ambulation, but the patient was able to ambulate normally in the ED. Patient reports that weakness in the right leg is normal for her, secondary to multiple surgeries to the area.   Past Medical History  Diagnosis Date  . History of kidney stones   . Rosacea   . MVP (mitral valve prolapse) MILD  . Right ureteral stone   . Coronary artery disease     CARDIOLOGIST- DR Alanda Amass-- LAST VISIT 04-04-11 (will request note)---   (per note cardiolite 2009 negative,  echo jan.2010  . GERD (gastroesophageal reflux disease)     CONTROLLED W/ ACIPHEX AND prn prevacid  . Edema occasional in ankles  . Hypothyroidism   . Dysrhythmia     HX SVT AND PAF--- occ. palpitations  . LBBB (left bundle branch block) CHRONIC  . H/O hiatal hernia   . RA (rheumatoid arthritis)     followed  by dr Kellie Simmering  . Atrial fibrillation   . Osteoporosis 12/2011     T score -2.7   Past Surgical History  Procedure Laterality Date  . Moles excised  2012    on nose  . Extracorporeal shock wave lithotripsy  09-24-10    and 1990  . Cataract extraction w/ intraocular lens  implant, bilateral  4 yrs ago    both eyes  . Knee arthroscopy  04/17/2011, right    Procedure: ARTHROSCOPY KNEE;  Surgeon: Gus Rankin Aluisio;  Location: [facility];  Service: Orthopedics;  Laterality: Right;  WITH LATERAL MENISCAL DEBRIDEMENT  . Cholecystectomy  2005  . Total knee arthroplasty  03/23/2012    Procedure: TOTAL KNEE ARTHROPLASTY;  Surgeon: Loanne Drilling, MD;  Location: [facility];  Service: Orthopedics;  Laterality: Right;  . Hip arthroplasty Right 10/12/2012    Procedure: ARTHROPLASTY BIPOLAR HIP;  Surgeon: Loanne Drilling, MD;  Location: [facility];  Service: Orthopedics;  Laterality: Right;  . Vaginal hysterectomy  1970    Leiomyomata   Family History  Problem Relation Age of Onset  . Cancer Mother     COLON  . Heart disease Father     STROKE  . Stroke Father   . Ovarian cancer Maternal Grandmother    History  Substance Use Topics  . Smoking status: Never Smoker   . Smokeless tobacco: Never Used  . Alcohol Use: No  OB History   Grav Para Term Preterm Abortions TAB SAB Ect Mult Living   2 2 2       2      Review of Systems  Constitutional: Negative for appetite change and fatigue.  HENT: Negative for congestion, sinus pressure and ear discharge.   Eyes: Negative for discharge.  Respiratory: Negative for cough.   Cardiovascular: Negative for chest pain.  Gastrointestinal: Negative for diarrhea.  Endocrine: Negative for polydipsia.  Genitourinary: Negative for frequency and hematuria.  Musculoskeletal: Negative for back pain.  Neurological: Positive for weakness and headaches. Negative for dizziness.    Allergies  Penicillins  Home Medications   Current Outpatient Rx   Name  Route  Sig  Dispense  Refill  . Atenolol (TENORMIN PO)   Oral   Take 12.5 mg by mouth daily.          . clorazepate (TRANXENE) 7.5 MG tablet   Oral   Take 7.5 mg by mouth 2 (two) times daily as needed. sleep         . digoxin (LANOXIN) 0.125 MG tablet   Oral   Take 62.5 mcg by mouth every morning.          Marland Kitchen levothyroxine (SYNTHROID, LEVOTHROID) 75 MCG tablet   Oral   Take 75 mcg by mouth every evening.          . pantoprazole (PROTONIX) 20 MG tablet   Oral   Take 20 mg by mouth every morning.          . Skin Protectants, Misc. (CALAZIME SKIN PROTECTANT EX)   Apply externally   Apply topically.          Triage Vitals: BP 90/47  Pulse 72  Temp(Src) 98.6 F (37 C) (Oral)  Resp 16  Ht 5\' 5"  (1.651 m)  Wt 125 lb (56.7 kg)  BMI 20.8 kg/m2  SpO2 98%  Physical Exam  Constitutional: She is oriented to person, place, and time. She appears well-developed.  HENT:  Head: Normocephalic.  Eyes: Conjunctivae and EOM are normal. No scleral icterus.  Neck: Neck supple. No thyromegaly present.  Cardiovascular: Normal rate, regular rhythm and normal heart sounds.  Exam reveals no gallop and no friction rub.   No murmur heard. Pulmonary/Chest: Effort normal and breath sounds normal. No stridor. No respiratory distress. She has no wheezes. She has no rales. She exhibits no tenderness.  Abdominal: She exhibits no distension. There is no tenderness. There is no rebound.  Musculoskeletal: Normal range of motion. She exhibits no edema.  Weakness to the right leg at baseline.   Lymphadenopathy:    She has no cervical adenopathy.  Neurological: She is oriented to person, place, and time. She exhibits normal muscle tone. Coordination normal.  No facial drooping. Good grip strength. Normal finger-to-nose bilaterally.   Skin: No rash noted. No erythema.  Psychiatric: She has a normal mood and affect. Her behavior is normal.    ED Course  Procedures (including critical  care time)  Medications  sodium chloride 0.9 % bolus 500 mL (500 mLs Intravenous New Bag/Given 02/28/13 1446)    DIAGNOSTIC STUDIES: Oxygen Saturation is 98% on room air, normal by my interpretation.    COORDINATION OF CARE: 2:09PM- Will order IV fluids, labs, chest x-ray, and a head CT. Discussed treatment plan with patient at bedside and patient verbalized agreement.   Labs Review Labs Reviewed  CBC WITH DIFFERENTIAL - Abnormal; Notable for the following:    Platelets 137 (*)  Neutrophils Relative % 80 (*)    Lymphocytes Relative 9 (*)    All other components within normal limits  COMPREHENSIVE METABOLIC PANEL - Abnormal; Notable for the following:    Glucose, Bld 116 (*)    Creatinine, Ser 1.14 (*)    Albumin 3.3 (*)    GFR calc non Af Amer 43 (*)    GFR calc Af Amer 50 (*)    All other components within normal limits  DIGOXIN LEVEL - Abnormal; Notable for the following:    Digoxin Level 0.3 (*)    All other components within normal limits  CULTURE, BLOOD (ROUTINE X 2)  CULTURE, BLOOD (ROUTINE X 2)  TROPONIN I  LACTIC ACID, PLASMA  URINALYSIS, ROUTINE W REFLEX MICROSCOPIC   Imaging Review Dg Chest 2 View  02/28/2013   CLINICAL DATA:  Posterior chest pain  EXAM: CHEST  2 VIEW  COMPARISON:  10/12/2012  FINDINGS: Lungs are hyperinflated. Normal cardiac silhouette. Chronic bronchitic markings again noted. No acute osseous abnormality.  IMPRESSION: No active cardiopulmonary disease.   Electronically Signed   By: Genevive Bi M.D.   On: 02/28/2013 15:43   Ct Head Wo Contrast  02/28/2013   CLINICAL DATA:  Altered mental status and weakness  EXAM: CT HEAD WITHOUT CONTRAST  TECHNIQUE: Contiguous axial images were obtained from the base of the skull through the vertex without intravenous contrast.  COMPARISON:  None.  FINDINGS: The bony calvarium is intact. Diffuse atrophic changes are noted. Mild chronic white matter ischemic change is seen. No findings to suggest acute  hemorrhage, acute infarction or space-occupying mass lesion are noted.  IMPRESSION: Chronic changes without acute abnormality.   Electronically Signed   By: Alcide Clever M.D.   On: 02/28/2013 15:32  CRITICAL CARE Performed by: Emmely Bittinger L Total critical care time: 35 Critical care time was exclusive of separately billable procedures and treating other patients. Critical care was necessary to treat or prevent imminent or life-threatening deterioration. Critical care was time spent personally by me on the following activities: development of treatment plan with patient and/or surrogate as well as nursing, discussions with consultants, evaluation of patient's response to treatment, examination of patient, obtaining history from patient or surrogate, ordering and performing treatments and interventions, ordering and review of laboratory studies, ordering and review of radiographic studies, pulse oximetry and re-evaluation of patient's condition.  Date: 02/28/2013  Rate:63  Rhythm: normal sinus rhythm  QRS Axis: normal  Intervals: normal  ST/T Wave abnormalities: normal  Conduction Disutrbances:left bundle branch block  Narrative Interpretation:   Old EKG Reviewed: unchanged     MDM  No diagnosis found.  The chart was scribed for me under my direct supervision.  I personally performed the history, physical, and medical decision making and all procedures in the evaluation of this patient.Benny Lennert, MD 02/28/13 1648  Benny Lennert, MD 02/28/13 7829  Benny Lennert, MD 02/28/13 [phone]

## 2013-02-28 NOTE — ED Notes (Signed)
Family called and notified of pt's room number.

## 2013-02-28 NOTE — H&P (Addendum)
History and Physical  Debra Richards BJY:[id number] DOB: 10-Sep-1925 DOA: 02/28/2013  Referring physician: Dr. Estell Harpin PCP: Ignatius Specking., MD   Chief Complaint: difficulty walking  HPI:  79 year old woman presented to the emergency department with history of generalized weakness, gait instability. Initial evaluation is notable for hypotension, UTI and suspected acute renal failure.  History provided by patient, daughter and son at bedside. Patient remains independent and lives alone, usually ambulates without device. For the last 3 days she has had intermittent chills, right worse especially at night, abnormal dreams, progressive generalized weakness and gait instability. Her daughter has noted at behavior at times including difficulty drinking from a water bottle and confusion. Because of the persistence of these symptoms and especially the behavior and confusion she was brought to the emergency department. She denies difficulty urinating or dysuria. She does report that she drinks very little fluids. She notes that her blood pressure is very well-controlled, systolic typically running 90-100. She denies headache. She has some chronic right leg pain and weakness which is unchanged. No focal neurologic deficits.  In the emergency department was noted to be borderline hypotensive with systolic 80-96. Heart rate, respirations and oxygenation were unremarkable. Afebrile. Chemistry panel suggested mild acute renal failure. Troponin, lactic acid unremarkable. CBC unremarkable. Digoxin level was subtherapeutic. Urinalysis was grossly positive. CT of the head and chest x-ray unremarkable. EKG nonacute. She was treated with ciprofloxacin and IV fluids. Improvement are noted in the emergency department.  Review of Systems:  Negative for visual changes, sore throat, rash, new muscle aches, chest pain, SOB, dysuria, bleeding, n/v/abdominal pain.  Past Medical History  Diagnosis Date  . History of  kidney stones   . Rosacea   . MVP (mitral valve prolapse) MILD  . Right ureteral stone   . Coronary artery disease     CARDIOLOGIST- DR Alanda Amass-- LAST VISIT 04-04-11 (will request note)---   (per note cardiolite 2009 negative,  echo jan.2010  . GERD (gastroesophageal reflux disease)     CONTROLLED W/ ACIPHEX AND prn prevacid  . Edema occasional in ankles  . Hypothyroidism   . Dysrhythmia     HX SVT AND PAF--- occ. palpitations  . LBBB (left bundle branch block) CHRONIC  . H/O hiatal hernia   . RA (rheumatoid arthritis)     followed by dr Kellie Simmering  . Atrial fibrillation   . Osteoporosis 12/2011     T score -2.7    Past Surgical History  Procedure Laterality Date  . Moles excised  2012    on nose  . Extracorporeal shock wave lithotripsy  09-24-10    and 1990  . Cataract extraction w/ intraocular lens  implant, bilateral  4 yrs ago    both eyes  . Knee arthroscopy  04/17/2011, right    Procedure: ARTHROSCOPY KNEE;  Surgeon: Gus Rankin Aluisio;  Location: [facility];  Service: Orthopedics;  Laterality: Right;  WITH LATERAL MENISCAL DEBRIDEMENT  . Cholecystectomy  2005  . Total knee arthroplasty  03/23/2012    Procedure: TOTAL KNEE ARTHROPLASTY;  Surgeon: Loanne Drilling, MD;  Location: [facility];  Service: Orthopedics;  Laterality: Right;  . Hip arthroplasty Right 10/12/2012    Procedure: ARTHROPLASTY BIPOLAR HIP;  Surgeon: Loanne Drilling, MD;  Location: [facility];  Service: Orthopedics;  Laterality: Right;  . Vaginal hysterectomy  1970    Leiomyomata    Social History:  reports that she has never smoked. She has never used smokeless tobacco. She reports that she does  not drink alcohol or use illicit drugs.  Allergies  Allergen Reactions  . Penicillins Hives    Family History  Problem Relation Age of Onset  . Cancer Mother     COLON  . Heart disease Father     STROKE  . Stroke Father   . Ovarian cancer Maternal Grandmother      Prior to Admission  medications   Medication Sig Start Date End Date Taking? Authorizing Provider  aspirin EC 81 MG tablet Take 81 mg by mouth every morning.   Yes Historical Provider, MD  atenolol (TENORMIN) 25 MG tablet Take 12.5 mg by mouth every morning.   Yes Historical Provider, MD  Calcium-Vitamin D-Vitamin K (CALCIUM SOFT CHEWS) 500-100-40 MG-UNT-MCG CHEW Chew by mouth daily.   Yes Historical Provider, MD  clorazepate (TRANXENE) 7.5 MG tablet Take 7.5 mg by mouth 2 (two) times daily as needed. sleep   Yes Historical Provider, MD  digoxin (LANOXIN) 0.125 MG tablet Take 0.0625 mg by mouth every morning.    Yes Historical Provider, MD  levothyroxine (SYNTHROID, LEVOTHROID) 75 MCG tablet Take 75 mcg by mouth every evening.    Yes Historical Provider, MD  Multiple Vitamin (MULTIVITAMIN WITH MINERALS) TABS tablet Take 1 tablet by mouth daily.   Yes Historical Provider, MD  pantoprazole (PROTONIX) 20 MG tablet Take 20 mg by mouth every morning.    Yes Historical Provider, MD   Physical Exam: Filed Vitals:   02/28/13 1601 02/28/13 1602 02/28/13 1605 02/28/13 1611  BP: 86/41 86/41 99/49  82/56  Pulse: 59 59 70 65  Temp:      TempSrc:      Resp: 12     Height:      Weight:      SpO2: 97%      General: Examined in the emergency department.  Appears calm and comfortable. Well-appearing. Eyes: PERRL, normal lids, irises & ENT: grossly normal hearing, lips & tongue Neck: no LAD, masses or thyromegaly Cardiovascular: RRR, no m/r/g. No LE edema. Respiratory: CTA bilaterally, no w/r/r. Normal respiratory effort. Abdomen: soft, ntnd Skin: no rash or induration seen  Musculoskeletal: grossly normal tone and strength BUE/BLE Psychiatric: grossly normal mood and affect, speech fluent and appropriate Neurologic: grossly non-focal.  Wt Readings from Last 3 Encounters:  02/28/13 56.7 kg (125 lb)  01/20/13 58.968 kg (130 lb)  10/12/12 58.06 kg (128 lb)    Labs on Admission:  Basic Metabolic Panel:  Recent  Labs Lab 02/28/13 1429  NA 135  K 3.7  CL 98  CO2 24  GLUCOSE 116*  BUN 19  CREATININE 1.14*  CALCIUM 9.4    Liver Function Tests:  Recent Labs Lab 02/28/13 1429  AST 23  ALT 11  ALKPHOS 62  BILITOT 0.5  PROT 6.7  ALBUMIN 3.3*    CBC:  Recent Labs Lab 02/28/13 1429  WBC 7.7  NEUTROABS 6.1  HGB 12.5  HCT 36.3  MCV 93.3  PLT 137*    Cardiac Enzymes:  Recent Labs Lab 02/28/13 1429  TROPONINI <0.30    Radiological Exams on Admission: Dg Chest 2 View  02/28/2013   CLINICAL DATA:  Posterior chest pain  EXAM: CHEST  2 VIEW  COMPARISON:  10/12/2012  FINDINGS: Lungs are hyperinflated. Normal cardiac silhouette. Chronic bronchitic markings again noted. No acute osseous abnormality.  IMPRESSION: No active cardiopulmonary disease.   Electronically Signed   By: Genevive Bi M.D.   On: 02/28/2013 15:43   Ct Head Wo Contrast  02/28/2013  CLINICAL DATA:  Altered mental status and weakness  EXAM: CT HEAD WITHOUT CONTRAST  TECHNIQUE: Contiguous axial images were obtained from the base of the skull through the vertex without intravenous contrast.  COMPARISON:  None.  FINDINGS: The bony calvarium is intact. Diffuse atrophic changes are noted. Mild chronic white matter ischemic change is seen. No findings to suggest acute hemorrhage, acute infarction or space-occupying mass lesion are noted.  IMPRESSION: Chronic changes without acute abnormality.   Electronically Signed   By: Alcide Clever M.D.   On: 02/28/2013 15:32    EKG: Independently reviewed. Normal sinus rhythm, left bundle branch block. Compared to previous study 10/12/2012, left bundle branch block is old.   Principal Problem:   UTI (lower urinary tract infection) Active Problems:   LBBB (left bundle branch block)   Hypotension   Acute renal failure   Generalized weakness   Thrombocytopenia   Assessment/Plan 1. UTI: Associated encephalopathy, generalized weakness and gait instability. Empiric antibiotics.  Followup culture. 2. Hypotension: Baseline systolic blood pressure 90-100 per patient. Just below baseline. No signs or symptoms to suggest sepsis. Lactic acid normal. History and creatinine suggest dehydration as etiology. IV fluids. Monitor clinically. Hold atenolol. 3. Dehydration, suspected acute renal failure: Baseline creatinine 0.6-0.7. IV fluids. Repeat basic metabolic panel the morning. 4. Generalized weakness, gait instability: Secondary to infection, dehydration. Physical therapy consult. 5. Thrombocytopenia: Appears to be chronic dating back to 02/2012. Appears to be at baseline. No further evaluation suggested. 6. History of atrial fibrillation: Continue digoxin. Currently in sinus rhythm.  Addendum: Red streaking up the arm after ciprofloxacin. Patient has allergy to penicillin however she has taken amoxicillin without difficulty. Start empiric Rocephin tomorrow.  Code Status: Full code  DVT prophylaxis:Lovenox  Family Communication: Discussed with son and daughter at bedside  Disposition Plan/Anticipated LOS: Admitted. 1-2 days.  Time spent: 55  minutes  Brendia Sacks, MD  [facility] Pager [phone] 02/28/2013, 5:08 PM

## 2013-02-28 NOTE — ED Notes (Signed)
Pt c/o headache x2-3 days. Pt states headache is on right side and radiates to her right ear. Pt states she took meclizine at home yesterday and reports some relief of headache. Pt's family reports some confusion with pt since symptoms began.

## 2013-02-28 NOTE — ED Notes (Signed)
Patient c/o headache that started on Thursday. Per patient started in top of head and then radiated into right ear. Per patient took a meclizine yesterday and felt better. Denies any headache today. Family also reports patient having slight confusion. Per family patient has also been "top heavy." Patient denies falling.

## 2013-02-28 NOTE — ED Notes (Addendum)
Pt's family has left for the afternoon. Charisse March - [phone] is pt's daughter and can be called if needed.

## 2013-02-28 NOTE — ED Notes (Signed)
Pt called staff into room due to mild pain at IV site. Red streaks noted on left arm. Cipro stopped. MD is being paged so he is aware.

## 2013-03-01 DIAGNOSIS — D696 Thrombocytopenia, unspecified: Secondary | ICD-10-CM

## 2013-03-01 DIAGNOSIS — R7881 Bacteremia: Secondary | ICD-10-CM

## 2013-03-01 LAB — CBC
HCT: 34.3 % — ABNORMAL LOW (ref 36.0–46.0)
Hemoglobin: 11.4 g/dL — ABNORMAL LOW (ref 12.0–15.0)
MCH: 31.7 pg (ref 26.0–34.0)
MCHC: 33.2 g/dL (ref 30.0–36.0)
MCV: 95.3 fL (ref 78.0–100.0)
RBC: 3.6 MIL/uL — ABNORMAL LOW (ref 3.87–5.11)
RDW: 13.5 % (ref 11.5–15.5)

## 2013-03-01 LAB — BASIC METABOLIC PANEL
BUN: 14 mg/dL (ref 6–23)
CO2: 24 mEq/L (ref 19–32)
Calcium: 8.5 mg/dL (ref 8.4–10.5)
Chloride: 111 mEq/L (ref 96–112)
Creatinine, Ser: 0.94 mg/dL (ref 0.50–1.10)
GFR calc non Af Amer: 54 mL/min — ABNORMAL LOW (ref >90)
Glucose, Bld: 104 mg/dL — ABNORMAL HIGH (ref 70–99)
Potassium: 3.7 mEq/L (ref 3.5–5.1)
Sodium: 144 mEq/L (ref 135–145)

## 2013-03-01 MED ORDER — SODIUM CHLORIDE 0.45 % IV SOLN
INTRAVENOUS | Status: DC
  Administered 2013-03-01 (×2): via INTRAVENOUS

## 2013-03-01 MED ORDER — PANTOPRAZOLE SODIUM 40 MG PO TBEC
40.0000 mg | DELAYED_RELEASE_TABLET | Freq: Every morning | ORAL | Status: DC
  Administered 2013-03-01 – 2013-03-04 (×4): 40 mg via ORAL
  Filled 2013-03-01 (×4): qty 1

## 2013-03-01 NOTE — Evaluation (Signed)
Physical Therapy Evaluation Patient Details Name: Debra Richards MRN: [id number] DOB: 1925/09/16 Today's Date: 03/01/2013 Time: 4034-7425 PT Time Calculation (min): 26 min  PT Assessment / Plan / Recommendation History of Present Illness   PT is an 79 yo female who has been referred to PT to improve her mobility  Clinical Impression  Pt is currently receiving OP therapy at [facility] in [place].  Pt is mod I with walker and will not need acute therapy                             PT Assessment  All further PT needs can be met in the next venue of care    Follow Up Recommendations  Outpatient PT    Does the patient have the potential to tolerate intense rehabilitation    nA  Barriers to Discharge  none      Equipment Recommendations  None recommended by PT    Recommendations for Other Services   none  Frequency   N/A   Precautions / Restrictions Precautions Precautions: None Restrictions Weight Bearing Restrictions: No   Pertinent Vitals/Pain 0/10      Mobility  Bed Mobility Bed Mobility: Supine to Sit;Sit to Supine Supine to Sit: 6: Modified independent (Device/Increase time) Sit to Supine: 6: Modified independent (Device/Increase time) Transfers Transfers: Sit to Stand Sit to Stand: 6: Modified independent (Device/Increase time) Ambulation/Gait Ambulation/Gait Assistance: 6: Modified independent (Device/Increase time) Ambulation Distance (Feet): 200 Feet Assistive device: Rolling walker    Exercises  none   PT Diagnosis: Difficulty walking;Generalized weakness  PT Problem List: Decreased activity tolerance;Decreased balance;Decreased strength PT Treatment Interventions:       PT Goals(Current goals can be found in the care plan section) Acute Rehab PT Goals Patient Stated Goal: To get stronger and be able to walk PT Goal Formulation: No goals set, d/c therapy Potential to Achieve Goals: Good  Visit Information  Last PT Received On: 03/01/13        Prior Functioning  Home Living Family/patient expects to be discharged to:: Private residence Living Arrangements: Spouse/significant other Available Help at Discharge: Family Type of Home: House Home Access: Stairs to enter Secretary/administrator of Steps: 3 Entrance Stairs-Rails: Right Home Layout: Able to live on main level with bedroom/bathroom Home Equipment: Walker - 2 wheels;Cane - single point Prior Function Level of Independence: Independent with assistive device(s) Communication Communication: No difficulties    Cognition  Cognition Arousal/Alertness: Awake/alert Overall Cognitive Status: Within Functional Limits for tasks assessed    Extremity/Trunk Assessment Lower Extremity Assessment Lower Extremity Assessment: Overall WFL for tasks assessed   Balance    End of Session PT - End of Session Equipment Utilized During Treatment: Gait belt Activity Tolerance: Patient tolerated treatment well Patient left: in chair;with call bell/phone within reach  GP     RUSSELL,CINDY 03/01/2013, 9:31 AM

## 2013-03-01 NOTE — Care Management Note (Signed)
    Page 1 of 1   03/04/2013     12:01:11 PM   CARE MANAGEMENT NOTE 03/04/2013  Patient:  Debra Richards, Debra Richards   Account Number:  [id number]  Date Initiated:  03/01/2013  Documentation initiated by:  Sharrie Rothman  Subjective/Objective Assessment:   Pt admitted from home with UTI and dehydration. Pt lives alone since husband is now staying with his daughter (pt has dementia). Pt has a cane for prn use and is driving to her appts. Pt also bought a w/c to have when she needed it.     Action/Plan:   PT has recommended outpt PT. Will arrnage at discharge. No other CM needs noted.   Anticipated DC Date:  03/03/2013   Anticipated DC Plan:  HOME/SELF CARE      DC Planning Services  CM consult      Choice offered to / List presented to:             Status of service:  Completed, signed off Medicare Important Message given?  YES (If response is "NO", the following Medicare IM given date fields will be blank) Date Medicare IM given:  03/04/2013 Date Additional Medicare IM given:    Discharge Disposition:  HOME/SELF CARE  Per UR Regulation:    If discussed at Long Length of Stay Meetings, dates discussed:    Comments:  03/04/13 1200 Arlyss Queen, RN BSN CM Pt discharged home today with daughter. Pts daughter will be staying with pt for a while after discharge. Orders for outpt PT faxed to [facility] in [place] (where pt was receiving outpt PT) and they will call her with appt time. No other CM needs noted.  03/01/13 1100 Arlyss Queen, RN BSN CM

## 2013-03-01 NOTE — Progress Notes (Signed)
UR chart review completed.  

## 2013-03-01 NOTE — Progress Notes (Signed)
[facility] PROGRESS NOTE  NOTNAMED CROUCHER RUE:[id number] DOB: 1925/06/19 DOA: 02/28/2013 PCP: Ignatius Specking., MD  Assessment/Plan: 1. UTI: Continue empiric ceftriaxone. 2. Gram variable rod bacteremia: Secondary to UTI. Plan as above. No fever or signs of frank hemodynamic instability. 3. Hypotension: Mild, asymptomatic. No signs or symptoms to suggest sepsis. She reports baseline systolic blood pressure 90-100. 4. Dehydration, acute renal failure: Improving with IV fluids. 5. Reported acute encephalopathy prior to admission has resolved 6. Generalized weakness, gait instability: Secondary to acute infection. Physical therapy consult appreciated. Outpatient PT. 7. Thrombocytopenia: Chronic, dates back to at least October 2013. Appears stable. No further inpatient evaluation suggested. 8. History of atrial fibrillation: Continue digoxin. Currently in sinus rhythm. Unclear why not on anticoagulant, this can be followed up as an outpatient.   Continue abx, f/u cultures  D/c telemetry  D/c foley  OOBTC, PT consult  Pending studies:   UC  BC  Code Status: full code DVT prophylaxis: Lovenox Family Communication: none Disposition Plan: home when improved  Brendia Sacks, MD  [facility]  Pager [phone] If 7PM-7AM, please contact night-coverage at [email or web address], password [facility] 03/01/2013, 8:55 AM  LOS: 1 day   Summary: 79 year old woman presented to the emergency department with history of generalized weakness, gait instability. Initial evaluation is notable for hypotension, UTI and suspected acute renal failure.  Consultants:  Physical therapy: Outpatient PT  Procedures:    Antibiotics:  Ceftriaxone 10/5 >>   HPI/Subjective: Doing ok, chills again this AM. No n/v. Generalized weakness. Blood culture 1/2 positive for gram variable rod. Dr. Orvan Falconer discussed with Dr. Ninetta Lights who recommended continuing antibiotics pending culture  results.  Objective: Filed Vitals:   02/28/13 1611 02/28/13 1750 02/28/13 2033 03/01/13 0422  BP: 82/56 87/39 106/44 94/50  Pulse: 65 56 57 57  Temp:   97.3 F (36.3 C) 97.8 F (36.6 C)  TempSrc:   Oral Oral  Resp:  18 18 18   Height:      Weight:      SpO2:  99% 99% 98%    Intake/Output Summary (Last 24 hours) at 03/01/13 0855 Last data filed at 03/01/13 0846  Gross per 24 hour  Intake    720 ml  Output   1100 ml  Net   -380 ml     Filed Weights   02/28/13 1341  Weight: 56.7 kg (125 lb)    Exam:   Afebrile, vital signs stable. Mild hypotension asymptomatic. Observed ambulating the hall with physical therapy.  General: Appears calm and comfortable. Appears weak, mildly ill. Speech fluent and clear.  Cardiovascular: Regular rate and rhythm. No murmur, rub, gallop.  Telemetry: Sinus rhythm.  Respiratory: Clear to auscultation bilaterally. No wheezes, rales, rhonchi. Normal respiratory effort.  Abdomen: Soft, nontender, nondistended.  Psychiatric: Grossly normal mood and affect. Speech fluent and appropriate.  Data Reviewed:  Basic metabolic panel shows improvement in creatinine. Otherwise unremarkable.  CBC notable for mild thrombocytopenia without significant change. No leukocytosis.  Scheduled Meds: . aspirin EC  81 mg Oral q morning - 10a  . cefTRIAXone (ROCEPHIN)  IV  1 g Intravenous Q24H  . digoxin  0.0625 mg Oral q morning - 10a  . enoxaparin (LOVENOX) injection  40 mg Subcutaneous Q24H  . influenza vac split quadrivalent PF  0.5 mL Intramuscular Tomorrow-1000  . levothyroxine  75 mcg Oral QPM  . pantoprazole  40 mg Oral q morning - 10a  . pneumococcal 23 valent vaccine  0.5 mL Intramuscular Tomorrow-1000  . sodium chloride  3 mL Intravenous Q12H   Continuous Infusions: . sodium chloride 75 mL/hr at 03/01/13 0825    Principal Problem:   UTI (lower urinary tract infection) Active Problems:   LBBB (left bundle branch block)   Hypotension    Acute renal failure   Generalized weakness   Thrombocytopenia   Acute encephalopathy   Bacteremia   Time spent 15 minutes

## 2013-03-02 LAB — URINE CULTURE: Colony Count: 70000

## 2013-03-02 LAB — BASIC METABOLIC PANEL
BUN: 9 mg/dL (ref 6–23)
CO2: 24 mEq/L (ref 19–32)
Calcium: 8.9 mg/dL (ref 8.4–10.5)
GFR calc non Af Amer: 61 mL/min — ABNORMAL LOW (ref >90)
Glucose, Bld: 104 mg/dL — ABNORMAL HIGH (ref 70–99)
Potassium: 3.3 mEq/L — ABNORMAL LOW (ref 3.5–5.1)
Sodium: 142 mEq/L (ref 135–145)

## 2013-03-02 MED ORDER — LORAZEPAM 2 MG/ML IJ SOLN
0.5000 mg | Freq: Four times a day (QID) | INTRAMUSCULAR | Status: DC | PRN
  Administered 2013-03-02: 0.5 mg via INTRAVENOUS
  Filled 2013-03-02: qty 1

## 2013-03-02 MED ORDER — POTASSIUM CHLORIDE CRYS ER 20 MEQ PO TBCR
40.0000 meq | EXTENDED_RELEASE_TABLET | Freq: Two times a day (BID) | ORAL | Status: AC
  Administered 2013-03-02 (×2): 40 meq via ORAL
  Filled 2013-03-02 (×2): qty 2

## 2013-03-02 MED ORDER — SODIUM CHLORIDE 0.9 % IV SOLN
1.5000 g | Freq: Three times a day (TID) | INTRAVENOUS | Status: DC
  Administered 2013-03-02 – 2013-03-04 (×6): 1.5 g via INTRAVENOUS
  Filled 2013-03-02 (×11): qty 1.5

## 2013-03-02 NOTE — Progress Notes (Signed)
ANTIBIOTIC CONSULT NOTE - INITIAL  Pharmacy Consult for Unasyn Indication: enterococcus UTI  * Pt has reported an allergy to PCN but has tolerated Amoxicillin in the past.  MD aware.    Allergies  Allergen Reactions  . Penicillins Hives    03/02/2013:  Discussed with Dr Irene Limbo.  Pt has tolerated Amoxicillin in the past.  Will go ahead with Unasyn and observe pt closely.  . Ciprofloxacin Rash   Patient Measurements: Height: 5\' 5"  (165.1 cm) Weight: 125 lb (56.7 kg) IBW/kg (Calculated) : 57  Vital Signs: Temp: 98.7 F (37.1 C) (10/07 0442) Temp src: Oral (10/07 0442) BP: 120/72 mmHg (10/07 0442) Pulse Rate: 68 (10/07 0941) Intake/Output from previous day: 10/06 0701 - 10/07 0700 In: 2760 [P.O.:1080; I.V.:1680] Out: 1550 [Urine:1550] Intake/Output from this shift: Total I/O In: 120 [P.O.:120] Out: 250 [Urine:250]  Labs:  Recent Labs  02/28/13 1429 03/01/13 0446 03/02/13 0457  WBC 7.7 5.1  --   HGB 12.5 11.4*  --   PLT 137* 128*  --   CREATININE 1.14* 0.94 0.85   Estimated Creatinine Clearance: 44.1 ml/min (by C-G formula based on Cr of 0.85). No results found for this basename: VANCOTROUGH, VANCOPEAK, VANCORANDOM, GENTTROUGH, GENTPEAK, GENTRANDOM, TOBRATROUGH, TOBRAPEAK, TOBRARND, AMIKACINPEAK, AMIKACINTROU, AMIKACIN,  in the last 72 hours   Microbiology: Recent Results (from the past 720 hour(s))  CULTURE, BLOOD (ROUTINE X 2)     Status: None   Collection Time    02/28/13  2:32 PM      Result Value Range Status   Specimen Description BLOOD LEFT ANTECUBITAL   Final   Special Requests     Final   Value: BOTTLES DRAWN AEROBIC AND ANAEROBIC AEB >15CC ANA 15 CC   Culture PENDING   Incomplete   Report Status PENDING   Incomplete  CULTURE, BLOOD (ROUTINE X 2)     Status: None   Collection Time    02/28/13  2:34 PM      Result Value Range Status   Specimen Description BLOOD RIGHT ANTECUBITAL   Final   Special Requests     Final   Value: BOTTLES DRAWN AEROBIC  AND ANAEROBIC AEB>15CC ANA 15CC   Culture     Final   Value: GRAM VARIABLE ROD     Gram Stain Report Called to,Read Back By and Verified With: HEARN,J @ 0015 ON 03/01/13 BY WOODIE,J     GS DONE @ [facility]   Report Status PENDING   Incomplete  URINE CULTURE     Status: None   Collection Time    02/28/13  3:50 PM      Result Value Range Status   Specimen Description URINE, RANDOM   Final   Special Requests NONE   Final   Culture  Setup Time     Final   Value: 02/28/2013 21:48     Performed at [facility] Count     Final   Value: 70,000 COLONIES/ML     Performed at [facility]   Culture     Final   Value: ENTEROCOCCUS SPECIES     Performed at [facility]   Report Status PENDING   Incomplete   Medical History: Past Medical History  Diagnosis Date  . History of kidney stones   . Rosacea   . MVP (mitral valve prolapse) MILD  . Right ureteral stone   . Coronary artery disease     CARDIOLOGIST- DR Alanda Amass-- LAST VISIT 04-04-11 (will request  note)---   (per note cardiolite 2009 negative,  echo jan.2010  . GERD (gastroesophageal reflux disease)     CONTROLLED W/ ACIPHEX AND prn prevacid  . Edema occasional in ankles  . Hypothyroidism   . Dysrhythmia     HX SVT AND PAF--- occ. palpitations  . LBBB (left bundle branch block) CHRONIC  . H/O hiatal hernia   . RA (rheumatoid arthritis)     followed by dr Kellie Simmering  . Atrial fibrillation   . Osteoporosis 12/2011     T score -2.7   Medications:  Scheduled:  . ampicillin-sulbactam (UNASYN) IV  1.5 g Intravenous Q8H  . aspirin EC  81 mg Oral q morning - 10a  . digoxin  0.0625 mg Oral q morning - 10a  . enoxaparin (LOVENOX) injection  40 mg Subcutaneous Q24H  . levothyroxine  75 mcg Oral QPM  . pantoprazole  40 mg Oral q morning - 10a  . potassium chloride  40 mEq Oral BID  . sodium chloride  3 mL Intravenous Q12H   Assessment: 79yo female admitted for weakness, hypotension and UTI.  Urine cx with  70K enterococcus.  F/U ID and Sensitivities. Estimated Creatinine Clearance: 44.1 ml/min (by C-G formula based on Cr of 0.85).  Goal of Therapy:  Eradicate infection.  Plan:  Unasyn 1.5gm IV q8h Observe pt closely for s/sx of allergic rxn Monitor labs, renal fxn and cultures  Valrie Hart A 03/02/2013,1:19 PM

## 2013-03-02 NOTE — Progress Notes (Signed)
[facility] PROGRESS NOTE  Debra Richards VHQ:[id number] DOB: 1925-07-20 DOA: 02/28/2013 PCP: Ignatius Specking., MD  Summary: 79 year old woman presented to the emergency department with history of generalized weakness, gait instability. Initial evaluation notable for hypotension, UTI and suspected acute renal failure. Currently issues are enterococcus UTI with gram variable rod bacteremia and mild acute encephalopathy. Hypotension has resolved. Will discontinue Rocephin today and change to Unasyn pending culture results. Likely home in 2-3 days.  Assessment/Plan: 1. UTI: Enterococcus. Change to Unasyn pending culture results. 2. Gram variable rod bacteremia: Secondary to UTI. Plan as above. No fever or signs of frank hemodynamic instability. 3. Acute encephalopathy: Secondary to infection. Continue antibiotics. 4. Hypotension: Resolved. Secondary to acute infection, possible dehydration. No signs or symptoms to suggest sepsis. She reports baseline systolic blood pressure 90-100. 5. Dehydration, acute renal failure: Resolved with IV fluids. 6. Generalized weakness, gait instability: Secondary to acute infection. Physical therapy consult appreciated. Outpatient PT. 7. Thrombocytopenia: Chronic, dates back to at least October 2013. Appears stable. No further inpatient evaluation suggested. 8. History of atrial fibrillation: Continue digoxin. Currently in sinus rhythm. Unclear why not on anticoagulant, this can be followed up as an outpatient.   Replete potassium  Discontinue Rocephin. Start Unasyn pending culture results. Patient has an allergy to penicillin but has taken amoxicillin in the past without difficulty.  Pending studies:   UC--enterococcus  BC  Code Status: full code DVT prophylaxis: Lovenox Family Communication: none Disposition Plan: home when improved  Debra Sacks, MD  [facility]  Pager [phone] If 7PM-7AM, please contact night-coverage at  [email or web address], password [facility] 03/02/2013, 10:59 AM  LOS: 2 days     Consultants:  Physical therapy: Outpatient PT  Procedures:    Antibiotics:  Ceftriaxone 10/5 >> 10/6    HPI/Subjective: Confused last night. Some headache.  Objective: Filed Vitals:   03/01/13 1356 03/01/13 2223 03/02/13 0442 03/02/13 0941  BP: 81/41 125/70 120/72   Pulse: 61 75 69 68  Temp: 98.5 F (36.9 C) 99.4 F (37.4 C) 98.7 F (37.1 C)   TempSrc: Oral Oral Oral   Resp: 18 20 18    Height:      Weight:      SpO2: 99% 98% 98%     Intake/Output Summary (Last 24 hours) at 03/02/13 1059 Last data filed at 03/02/13 0900  Gross per 24 hour  Intake   2640 ml  Output   1600 ml  Net   1040 ml     Filed Weights   02/28/13 1341  Weight: 56.7 kg (125 lb)    Exam:   Afebrile, vital signs stable. Hypotension has resolved.  General: Appears mildly anxious and comfortable. Confused.  Cardiovascular: Regular rate and rhythm. No murmur, rub, gallop.  Respiratory: Clear to auscultation bilaterally. No wheezes, rales, rhonchi. Normal respiratory effort.  Abdomen: Soft, nontender, nondistended.  Psychiatric: Confused. Speech fluent and appropriate. Not oriented to location or year.  Data Reviewed:  Creatinine appears to be near baseline. Potassium 3.3.  Scheduled Meds: . aspirin EC  81 mg Oral q morning - 10a  . cefTRIAXone (ROCEPHIN)  IV  1 g Intravenous Q24H  . digoxin  0.0625 mg Oral q morning - 10a  . enoxaparin (LOVENOX) injection  40 mg Subcutaneous Q24H  . levothyroxine  75 mcg Oral QPM  . pantoprazole  40 mg Oral q morning - 10a  . potassium chloride  40 mEq Oral BID  . sodium chloride  3 mL Intravenous Q12H   Continuous Infusions: .  sodium chloride 75 mL/hr at 03/02/13 1610    Principal Problem:   UTI (lower urinary tract infection) Active Problems:   LBBB (left bundle branch block)   Hypotension   Acute renal failure   Generalized weakness   Thrombocytopenia   Acute  encephalopathy   Bacteremia   Time spent 15 minutes

## 2013-03-03 DIAGNOSIS — E876 Hypokalemia: Secondary | ICD-10-CM

## 2013-03-03 DIAGNOSIS — E039 Hypothyroidism, unspecified: Secondary | ICD-10-CM

## 2013-03-03 MED ORDER — POTASSIUM CHLORIDE CRYS ER 20 MEQ PO TBCR
40.0000 meq | EXTENDED_RELEASE_TABLET | Freq: Once | ORAL | Status: AC
  Administered 2013-03-03: 40 meq via ORAL
  Filled 2013-03-03: qty 2

## 2013-03-03 NOTE — Progress Notes (Signed)
ANTIBIOTIC CONSULT NOTE   Pharmacy Consult for Unasyn Indication: enterococcus UTI  * Pt has reported an allergy to PCN but has tolerated Amoxicillin in the past.  MD aware.    Allergies  Allergen Reactions  . Penicillins Hives    03/02/2013:  Discussed with Dr Irene Limbo.  Pt has tolerated Amoxicillin in the past.  Will go ahead with Unasyn and observe pt closely.  . Ciprofloxacin Rash   Patient Measurements: Height: 5\' 5"  (165.1 cm) Weight: 125 lb (56.7 kg) IBW/kg (Calculated) : 57  Vital Signs: Temp: 98.4 F (36.9 C) (10/08 0607) Temp src: Oral (10/08 0607) BP: 111/52 mmHg (10/08 0607) Pulse Rate: 74 (10/08 0607) Intake/Output from previous day: 10/07 0701 - 10/08 0700 In: 1531 [P.O.:840; I.V.:541; IV Piggyback:150] Out: 950 [Urine:950] Intake/Output from this shift: Total I/O In: 320 [P.O.:320] Out: -   Labs:  Recent Labs  02/28/13 1429 03/01/13 0446 03/02/13 0457  WBC 7.7 5.1  --   HGB 12.5 11.4*  --   PLT 137* 128*  --   CREATININE 1.14* 0.94 0.85   Estimated Creatinine Clearance: 44.1 ml/min (by C-G formula based on Cr of 0.85). No results found for this basename: VANCOTROUGH, VANCOPEAK, VANCORANDOM, GENTTROUGH, GENTPEAK, GENTRANDOM, TOBRATROUGH, TOBRAPEAK, TOBRARND, AMIKACINPEAK, AMIKACINTROU, AMIKACIN,  in the last 72 hours   Microbiology: Recent Results (from the past 720 hour(s))  CULTURE, BLOOD (ROUTINE X 2)     Status: None   Collection Time    02/28/13  2:32 PM      Result Value Range Status   Specimen Description BLOOD LEFT ANTECUBITAL   Final   Special Requests     Final   Value: BOTTLES DRAWN AEROBIC AND ANAEROBIC AEB >15CC ANA 15 CC   Culture PENDING   Incomplete   Report Status PENDING   Incomplete  CULTURE, BLOOD (ROUTINE X 2)     Status: None   Collection Time    02/28/13  2:34 PM      Result Value Range Status   Specimen Description BLOOD RIGHT ANTECUBITAL   Final   Special Requests     Final   Value: BOTTLES DRAWN AEROBIC AND  ANAEROBIC AEB>15CC ANA 15CC   Culture     Final   Value: GRAM VARIABLE ROD     Gram Stain Report Called to,Read Back By and Verified With: HEARN,J @ 0015 ON 03/01/13 BY WOODIE,J     GS DONE @ [facility]   Report Status PENDING   Incomplete  URINE CULTURE     Status: None   Collection Time    02/28/13  3:50 PM      Result Value Range Status   Specimen Description URINE, RANDOM   Final   Special Requests NONE   Final   Culture  Setup Time     Final   Value: 02/28/2013 21:48     Performed at [facility] Count     Final   Value: 70,000 COLONIES/ML     Performed at [facility]   Culture     Final   Value: ENTEROCOCCUS SPECIES     Performed at [facility]   Report Status 03/02/2013 FINAL   Final   Organism ID, Bacteria ENTEROCOCCUS SPECIES   Final   Medical History: Past Medical History  Diagnosis Date  . History of kidney stones   . Rosacea   . MVP (mitral valve prolapse) MILD  . Right ureteral stone   . Coronary artery disease  CARDIOLOGIST- DR Alanda Amass-- LAST VISIT 04-04-11 (will request note)---   (per note cardiolite 2009 negative,  echo jan.2010  . GERD (gastroesophageal reflux disease)     CONTROLLED W/ ACIPHEX AND prn prevacid  . Edema occasional in ankles  . Hypothyroidism   . Dysrhythmia     HX SVT AND PAF--- occ. palpitations  . LBBB (left bundle branch block) CHRONIC  . H/O hiatal hernia   . RA (rheumatoid arthritis)     followed by dr Kellie Simmering  . Atrial fibrillation   . Osteoporosis 12/2011     T score -2.7   Medications:  Scheduled:  . ampicillin-sulbactam (UNASYN) IV  1.5 g Intravenous Q8H  . aspirin EC  81 mg Oral q morning - 10a  . digoxin  0.0625 mg Oral q morning - 10a  . enoxaparin (LOVENOX) injection  40 mg Subcutaneous Q24H  . levothyroxine  75 mcg Oral QPM  . pantoprazole  40 mg Oral q morning - 10a  . sodium chloride  3 mL Intravenous Q12H   Assessment: 79yo female admitted for weakness, hypotension and UTI.   Urine cx with 70K enterococcus.  PCN allergy noted but pt has tolerated Amoxil in the past.  No problems noted with Unasyn. Estimated Creatinine Clearance: 44.1 ml/min (by C-G formula based on Cr of 0.85).   Afebrile.  Organism ID, Bacteria ENTEROCOCCUS SPECIES    Resulting Agency [facility]   Culture & Susceptibility    Antibiotic  Organism Organism Organism     ENTEROCOCCUS SPECIES     AMPICILLIN  <=2 SENSITIVE S Final       LEVOFLOXACIN  1 SENSITIVE S Final       NITROFURANTOIN  <=16 SENSITIVE S Final       TETRACYCLINE  >=16 RESISTANT R Final       VANCOMYCIN  1 SENSITIVE S Final       Goal of Therapy:  Eradicate infection.  Plan:  Unasyn 1.5gm IV q8h Observe pt closely for s/sx of allergic rxn Monitor labs, renal fxn and cultures  Valrie Hart A 03/03/2013,8:50 AM

## 2013-03-03 NOTE — Progress Notes (Signed)
[facility] PROGRESS NOTE  Debra Richards WJX:[id number] DOB: 1925-06-26 DOA: 02/28/2013 PCP: Ignatius Specking., MD  Summary: 79 year old woman presented to the emergency department with history of generalized weakness, gait instability. Initial evaluation notable for hypotension, UTI and suspected acute renal failure. Currently issues are enterococcus UTI and mild acute encephalopathy. Hypotension has resolved. Will discontinue Rocephin today and change to Unasyn pending culture results. Likely home in am. Mental status appears to be improving.  Assessment/Plan: 1. UTI: Enterococcus. On unasyn.  Continue for today.  Will likely transition to augmentin tomorrow. 2. Gram variable rod bacteremia: Addendum on blood culture report shows that this is actually no specific growth.  Continue to follow clinically. 3. Hypotension: Resolved. Secondary to acute infection, possible dehydration. No signs or symptoms to suggest sepsis. She reports baseline systolic blood pressure 90-100. 4. Dehydration, acute renal failure: Resolved with IV fluids. 5. Generalized weakness, gait instability: Secondary to acute infection. Physical therapy consult appreciated. Outpatient PT. 6. Thrombocytopenia: Chronic, dates back to at least October 2013. Appears stable. No further inpatient evaluation suggested. 7. History of atrial fibrillation: Continue digoxin. Currently in sinus rhythm. Unclear why not on anticoagulant, this can be followed up as an outpatient.  Pending studies:   UC--enterococcus  BC no growth  Code Status: full code DVT prophylaxis: Lovenox Family Communication: none Disposition Plan: home when improved, likely in am  Erick Blinks, MD  [facility]  Pager [phone] If 7PM-7AM, please contact night-coverage at [email or web address], password [facility] 03/03/2013, 1:50 PM  LOS: 3 days     Consultants:  Physical therapy: Outpatient PT  Procedures:    Antibiotics:  Ceftriaxone  10/5 >> 10/6    HPI/Subjective: Has some headache. New complaints.  Sitter is present in room  Objective: Filed Vitals:   03/02/13 0941 03/02/13 1457 03/02/13 2126 03/03/13 0607  BP:  124/75 122/39 111/52  Pulse: 68 67 81 74  Temp:  98.5 F (36.9 C) 99.5 F (37.5 C) 98.4 F (36.9 C)  TempSrc:  Oral Oral Oral  Resp:  20 20 20   Height:      Weight:      SpO2:  98% 100% 100%    Intake/Output Summary (Last 24 hours) at 03/03/13 1350 Last data filed at 03/03/13 0842  Gross per 24 hour  Intake   1611 ml  Output    700 ml  Net    911 ml     Filed Weights   02/28/13 1341  Weight: 56.7 kg (125 lb)    Exam:   Afebrile, vital signs stable. Hypotension has resolved.  General: Appears mildly anxious and comfortable. Confused.  Cardiovascular: Regular rate and rhythm. No murmur, rub, gallop.  Respiratory: Clear to auscultation bilaterally. No wheezes, rales, rhonchi. Normal respiratory effort.  Abdomen: Soft, nontender, nondistended.  Psychiatric: pleasant, speech is coherent, seems mildly confused  Data Reviewed:    Scheduled Meds: . ampicillin-sulbactam (UNASYN) IV  1.5 g Intravenous Q8H  . aspirin EC  81 mg Oral q morning - 10a  . digoxin  0.0625 mg Oral q morning - 10a  . enoxaparin (LOVENOX) injection  40 mg Subcutaneous Q24H  . levothyroxine  75 mcg Oral QPM  . pantoprazole  40 mg Oral q morning - 10a  . sodium chloride  3 mL Intravenous Q12H   Continuous Infusions:    Principal Problem:   UTI (lower urinary tract infection) Active Problems:   LBBB (left bundle branch block)   Hypotension   Acute renal failure   Generalized weakness  Thrombocytopenia   Acute encephalopathy   Bacteremia   Time spent 25 minutes

## 2013-03-04 MED ORDER — AMOXICILLIN-POT CLAVULANATE 500-125 MG PO TABS: 1.0000 | ORAL_TABLET | Freq: Three times a day (TID) | ORAL | Status: DC

## 2013-03-04 NOTE — Progress Notes (Signed)
UR chart review completed.  

## 2013-03-04 NOTE — Progress Notes (Signed)
Pt is to be discharged home with family today. Pt is in NAD, IV is out, all paperwork has been reviewed/discussed with patient, and there are no questions/concerns at this time. Assessment is unchanged from this morning. Pt is to be accompanied downstairs by staff and family via wheelchair.

## 2013-03-04 NOTE — Discharge Summary (Signed)
Physician Discharge Summary  Debra Richards ZOX:[id number] DOB: [birth year] DOA: 02/28/2013  PCP: Ignatius Specking., MD  Admit date: 02/28/2013 Discharge date: 03/04/2013  Time spent: 35 minutes  Recommendations for Outpatient Follow-up:  1. Follow up with primary care physician in 2 weeks  Discharge Diagnoses:  Principal Problem:   UTI (lower urinary tract infection) Active Problems:   LBBB (left bundle branch block)   Hypotension   Acute renal failure   Generalized weakness   Thrombocytopenia   Acute encephalopathy   Discharge Condition: improved  Diet recommendation: low salt  Filed Weights   02/28/13 1341  Weight: 56.7 kg (125 lb)    History of present illness:  79 year old woman presented to the emergency department with history of generalized weakness, gait instability. Initial evaluation is notable for hypotension, UTI and suspected acute renal failure.  History provided by patient, daughter and son at bedside. Patient remains independent and lives alone, usually ambulates without device. For the last 3 days she has had intermittent chills, right worse especially at night, abnormal dreams, progressive generalized weakness and gait instability. Her daughter has noted at behavior at times including difficulty drinking from a water bottle and confusion. Because of the persistence of these symptoms and especially the behavior and confusion she was brought to the emergency department. She denies difficulty urinating or dysuria. She does report that she drinks very little fluids. She notes that her blood pressure is very well-controlled, systolic typically running 90-100. She denies headache. She has some chronic right leg pain and weakness which is unchanged. No focal neurologic deficits.  In the emergency department was noted to be borderline hypotensive with systolic 80-96. Heart rate, respirations and oxygenation were unremarkable. Afebrile. Chemistry panel suggested mild  acute renal failure. Troponin, lactic acid unremarkable. CBC unremarkable. Digoxin level was subtherapeutic. Urinalysis was grossly positive. CT of the head and chest x-ray unremarkable. EKG nonacute. She was treated with ciprofloxacin and IV fluids. Improvement are noted in the emergency department.   Hospital Course:  This patient was admitted to the hospital with urinary tract infection, acute renal failure, hypotension and generalized weakness.  She was started empirically on antibiotics.  Urine culture was positive for Enterococcus.  She was started on unasyn and has clinically improved.  Although she has been listed with a penicillin allergy in the past, she tolerated ampicillin without any problems.  She has been transitioned over to augmentin to complete the course.  Blood cultures showed no growth.  She did have an episode of confusion which was likely related to UTI, but this has since resolved. Renal function has normalized. She is ready for discharge home today  Procedures:  none  Consultations:  none  Discharge Exam: Filed Vitals:   03/04/13 0622  BP: 109/80  Pulse: 73  Temp: 97.7 F (36.5 C)  Resp: 20    General: NAD Cardiovascular: S1, S2 RRR Respiratory: CTA B  Discharge Instructions      Discharge Orders   Future Orders Complete By Expires   Call MD for:  temperature >100.4  As directed    Diet - low sodium heart healthy  As directed    Increase activity slowly  As directed        Medication List         amoxicillin-clavulanate 500-125 MG per tablet  Commonly known as:  AUGMENTIN  Take 1 tablet (500 mg total) by mouth 3 (three) times daily.     aspirin EC 81 MG tablet  Take 81 mg  by mouth every morning.     atenolol 25 MG tablet  Commonly known as:  TENORMIN  Take 12.5 mg by mouth every morning.     CALCIUM SOFT CHEWS 500-100-40 MG-UNT-MCG Chew  Generic drug:  Calcium-Vitamin D-Vitamin K  Chew by mouth daily.     clorazepate 7.5 MG tablet   Commonly known as:  TRANXENE  Take 7.5 mg by mouth 2 (two) times daily as needed. sleep     digoxin 0.125 MG tablet  Commonly known as:  LANOXIN  Take 0.0625 mg by mouth every morning.     levothyroxine 75 MCG tablet  Commonly known as:  SYNTHROID, LEVOTHROID  Take 75 mcg by mouth every evening.     multivitamin with minerals Tabs tablet  Take 1 tablet by mouth daily.     pantoprazole 20 MG tablet  Commonly known as:  PROTONIX  Take 20 mg by mouth every morning.       Allergies  Allergen Reactions  . Penicillins Hives    03/04/13: Pt has tolerated Amoxicillin/Unsyn without reaction  . Ciprofloxacin Rash      The results of significant diagnostics from this hospitalization (including imaging, microbiology, ancillary and laboratory) are listed below for reference.    Significant Diagnostic Studies: Dg Chest 2 View  02/28/2013   CLINICAL DATA:  Posterior chest pain  EXAM: CHEST  2 VIEW  COMPARISON:  10/12/2012  FINDINGS: Lungs are hyperinflated. Normal cardiac silhouette. Chronic bronchitic markings again noted. No acute osseous abnormality.  IMPRESSION: No active cardiopulmonary disease.   Electronically Signed   By: Genevive Bi M.D.   On: 02/28/2013 15:43   Ct Head Wo Contrast  02/28/2013   CLINICAL DATA:  Altered mental status and weakness  EXAM: CT HEAD WITHOUT CONTRAST  TECHNIQUE: Contiguous axial images were obtained from the base of the skull through the vertex without intravenous contrast.  COMPARISON:  None.  FINDINGS: The bony calvarium is intact. Diffuse atrophic changes are noted. Mild chronic white matter ischemic change is seen. No findings to suggest acute hemorrhage, acute infarction or space-occupying mass lesion are noted.  IMPRESSION: Chronic changes without acute abnormality.   Electronically Signed   By: Alcide Clever M.D.   On: 02/28/2013 15:32    Microbiology: Recent Results (from the past 240 hour(s))  CULTURE, BLOOD (ROUTINE X 2)     Status: None    Collection Time    02/28/13  2:32 PM      Result Value Range Status   Specimen Description BLOOD LEFT ANTECUBITAL   Final   Special Requests BOTTLES DRAWN AEROBIC AND ANAEROBIC 15CC   Final   Culture NO GROWTH 4 DAYS   Final   Report Status PENDING   Incomplete  CULTURE, BLOOD (ROUTINE X 2)     Status: None   Collection Time    02/28/13  2:34 PM      Result Value Range Status   Specimen Description BLOOD RIGHT ANTECUBITAL   Final   Special Requests BOTTLES DRAWN AEROBIC AND ANAEROBIC 15CC   Final   Culture  Setup Time     Final   Value: 03/01/2013 14:34     Performed at [facility]   Culture     Final   Value:        BLOOD CULTURE RECEIVED NO GROWTH TO DATE CULTURE WILL BE HELD FOR 5 DAYS BEFORE ISSUING A FINAL NEGATIVE REPORT     Note: PREVIOUSLY REPORTED AS GRAM VARIABLE ROD CORRECTED RESULTS  CALLED TO: MARGARET BAUGHAM 03/03/13 1230 BY SMITHERSJ     Performed at [facility]   Report Status PENDING   Incomplete  URINE CULTURE     Status: None   Collection Time    02/28/13  3:50 PM      Result Value Range Status   Specimen Description URINE, RANDOM   Final   Special Requests NONE   Final   Culture  Setup Time     Final   Value: 02/28/2013 21:48     Performed at [facility]   Colony Count     Final   Value: 70,000 COLONIES/ML     Performed at [facility]   Culture     Final   Value: ENTEROCOCCUS SPECIES     Performed at [facility]   Report Status 03/02/2013 FINAL   Final   Organism ID, Bacteria ENTEROCOCCUS SPECIES   Final     Labs: Basic Metabolic Panel:  Recent Labs Lab 02/28/13 1429 03/01/13 0446 03/02/13 0457  NA 135 144 142  K 3.7 3.7 3.3*  CL 98 111 107  CO2 24 24 24   GLUCOSE 116* 104* 104*  BUN 19 14 9   CREATININE 1.14* 0.94 0.85  CALCIUM 9.4 8.5 8.9   Liver Function Tests:  Recent Labs Lab 02/28/13 1429  AST 23  ALT 11  ALKPHOS 62  BILITOT 0.5  PROT 6.7  ALBUMIN 3.3*   No results found for this  basename: LIPASE, AMYLASE,  in the last 168 hours No results found for this basename: AMMONIA,  in the last 168 hours CBC:  Recent Labs Lab 02/28/13 1429 03/01/13 0446  WBC 7.7 5.1  NEUTROABS 6.1  --   HGB 12.5 11.4*  HCT 36.3 34.3*  MCV 93.3 95.3  PLT 137* 128*   Cardiac Enzymes:  Recent Labs Lab 02/28/13 1429  TROPONINI <0.30   BNP: BNP (last 3 results) No results found for this basename: PROBNP,  in the last 8760 hours CBG: No results found for this basename: GLUCAP,  in the last 168 hours     Signed:  Leonna Schlee  [facility] 03/04/2013, 11:22 AM

## 2013-03-05 ENCOUNTER — Other Ambulatory Visit: Payer: Self-pay | Admitting: *Deleted

## 2013-03-05 LAB — CULTURE, BLOOD (ROUTINE X 2)

## 2013-03-05 MED ORDER — LEVOTHYROXINE SODIUM 75 MCG PO TABS: ORAL_TABLET | ORAL | Status: DC

## 2013-03-05 NOTE — Telephone Encounter (Signed)
Rx was sent to pharmacy electronically. 

## 2013-03-11 LAB — CULTURE, BLOOD (ROUTINE X 2): Culture: NO GROWTH

## 2013-03-30 ENCOUNTER — Ambulatory Visit (INDEPENDENT_AMBULATORY_CARE_PROVIDER_SITE_OTHER): Payer: Medicare Other | Admitting: Cardiovascular Disease

## 2013-03-30 VITALS — BP 125/58 | HR 52 | Ht 65.0 in | Wt 125.4 lb

## 2013-03-30 DIAGNOSIS — I447 Left bundle-branch block, unspecified: Secondary | ICD-10-CM

## 2013-03-30 DIAGNOSIS — Q676 Pectus excavatum: Secondary | ICD-10-CM

## 2013-03-30 DIAGNOSIS — I499 Cardiac arrhythmia, unspecified: Secondary | ICD-10-CM

## 2013-03-30 DIAGNOSIS — E039 Hypothyroidism, unspecified: Secondary | ICD-10-CM

## 2013-03-30 DIAGNOSIS — K219 Gastro-esophageal reflux disease without esophagitis: Secondary | ICD-10-CM

## 2013-03-30 NOTE — Patient Instructions (Signed)
Your physician recommends that you schedule a follow-up appointment in: 6 months  Your physician has recommended you make the following change in your medication: Take Atenolol as needed

## 2013-04-20 ENCOUNTER — Emergency Department (HOSPITAL_COMMUNITY)
Admission: EM | Admit: 2013-04-20 | Discharge: 2013-04-21 | Disposition: A | Discharge status: Home / Self Care | Payer: No Typology Code available for payment source | Attending: Emergency Medicine | Admitting: Emergency Medicine

## 2013-04-20 ENCOUNTER — Emergency Department (HOSPITAL_COMMUNITY): Payer: No Typology Code available for payment source

## 2013-04-20 ENCOUNTER — Encounter (HOSPITAL_COMMUNITY): Payer: Self-pay | Admitting: Emergency Medicine

## 2013-04-20 DIAGNOSIS — Z880 Allergy status to penicillin: Secondary | ICD-10-CM | POA: Diagnosis not present

## 2013-04-20 DIAGNOSIS — M810 Age-related osteoporosis without current pathological fracture: Secondary | ICD-10-CM | POA: Insufficient documentation

## 2013-04-20 DIAGNOSIS — Z87442 Personal history of urinary calculi: Secondary | ICD-10-CM | POA: Insufficient documentation

## 2013-04-20 DIAGNOSIS — Z872 Personal history of diseases of the skin and subcutaneous tissue: Secondary | ICD-10-CM | POA: Insufficient documentation

## 2013-04-20 DIAGNOSIS — M069 Rheumatoid arthritis, unspecified: Secondary | ICD-10-CM | POA: Diagnosis not present

## 2013-04-20 DIAGNOSIS — Z7982 Long term (current) use of aspirin: Secondary | ICD-10-CM | POA: Diagnosis not present

## 2013-04-20 DIAGNOSIS — E039 Hypothyroidism, unspecified: Secondary | ICD-10-CM | POA: Insufficient documentation

## 2013-04-20 DIAGNOSIS — IMO0002 Reserved for concepts with insufficient information to code with codable children: Secondary | ICD-10-CM | POA: Diagnosis not present

## 2013-04-20 DIAGNOSIS — S2220XA Unspecified fracture of sternum, initial encounter for closed fracture: Secondary | ICD-10-CM | POA: Insufficient documentation

## 2013-04-20 DIAGNOSIS — I499 Cardiac arrhythmia, unspecified: Secondary | ICD-10-CM | POA: Diagnosis not present

## 2013-04-20 DIAGNOSIS — Z79899 Other long term (current) drug therapy: Secondary | ICD-10-CM | POA: Diagnosis not present

## 2013-04-20 DIAGNOSIS — Y9389 Activity, other specified: Secondary | ICD-10-CM | POA: Diagnosis not present

## 2013-04-20 DIAGNOSIS — I4891 Unspecified atrial fibrillation: Secondary | ICD-10-CM | POA: Diagnosis not present

## 2013-04-20 DIAGNOSIS — K219 Gastro-esophageal reflux disease without esophagitis: Secondary | ICD-10-CM | POA: Diagnosis not present

## 2013-04-20 DIAGNOSIS — I2510 Atherosclerotic heart disease of native coronary artery without angina pectoris: Secondary | ICD-10-CM | POA: Diagnosis not present

## 2013-04-20 DIAGNOSIS — S8990XA Unspecified injury of unspecified lower leg, initial encounter: Secondary | ICD-10-CM | POA: Insufficient documentation

## 2013-04-20 DIAGNOSIS — S298XXA Other specified injuries of thorax, initial encounter: Secondary | ICD-10-CM | POA: Diagnosis present

## 2013-04-20 DIAGNOSIS — G319 Degenerative disease of nervous system, unspecified: Secondary | ICD-10-CM | POA: Insufficient documentation

## 2013-04-20 DIAGNOSIS — Y92410 Unspecified street and highway as the place of occurrence of the external cause: Secondary | ICD-10-CM | POA: Diagnosis not present

## 2013-04-20 LAB — BASIC METABOLIC PANEL
BUN: 18 mg/dL (ref 6–23)
Calcium: 9.9 mg/dL (ref 8.4–10.5)
Creatinine, Ser: 0.97 mg/dL (ref 0.50–1.10)
GFR calc Af Amer: 60 mL/min — ABNORMAL LOW (ref >90)
GFR calc non Af Amer: 52 mL/min — ABNORMAL LOW (ref >90)
Glucose, Bld: 106 mg/dL — ABNORMAL HIGH (ref 70–99)

## 2013-04-20 MED ORDER — HYDROCODONE-ACETAMINOPHEN 5-325 MG PO TABS: ORAL_TABLET | ORAL | Status: DC

## 2013-04-20 MED ORDER — ONDANSETRON HCL 4 MG PO TABS
4.0000 mg | ORAL_TABLET | Freq: Once | ORAL | Status: AC
  Administered 2013-04-20: 4 mg via ORAL
  Filled 2013-04-20: qty 1

## 2013-04-20 MED ORDER — HYDROCODONE-ACETAMINOPHEN 5-325 MG PO TABS
1.0000 | ORAL_TABLET | Freq: Once | ORAL | Status: AC
  Administered 2013-04-20: 0.5 via ORAL
  Filled 2013-04-20: qty 1

## 2013-04-20 MED ORDER — SODIUM CHLORIDE 0.9 % IJ SOLN
INTRAMUSCULAR | Status: AC
  Administered 2013-04-20: 22:00:00
  Filled 2013-04-20: qty 500
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
## 2013-04-20 NOTE — ED Provider Notes (Signed)
Medical screening examination/treatment/procedure(s) were conducted as a shared visit with non-physician practitioner(s) and myself.  I personally evaluated the patient during the encounter.  EKG Interpretation   None      Patient is hemodynamically stable. Normal vital signs. Good color. No dyspnea. Discussed x-ray results with patient and her son  Donnetta Hutching, MD 04/20/13 2352
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
## 2013-04-21 DIAGNOSIS — S2220XA Unspecified fracture of sternum, initial encounter for closed fracture: Secondary | ICD-10-CM | POA: Diagnosis not present

## 2013-04-25 ENCOUNTER — Encounter: Payer: Self-pay | Admitting: Cardiovascular Disease

## 2013-04-25 DIAGNOSIS — Q676 Pectus excavatum: Secondary | ICD-10-CM | POA: Insufficient documentation
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
## 2013-04-26 ENCOUNTER — Encounter: Payer: Self-pay | Admitting: Cardiovascular Disease

## 2013-07-09 ENCOUNTER — Other Ambulatory Visit: Payer: Self-pay | Admitting: *Deleted

## 2013-07-09 MED ORDER — DIGOXIN 125 MCG PO TABS: 0.0625 mg | ORAL_TABLET | Freq: Every morning | ORAL | Status: DC

## 2013-08-04 ENCOUNTER — Other Ambulatory Visit: Payer: Self-pay | Admitting: Cardiovascular Disease

## 2013-09-06 ENCOUNTER — Other Ambulatory Visit: Payer: Self-pay

## 2013-09-06 NOTE — Telephone Encounter (Signed)
Please advise 

## 2013-09-08 ENCOUNTER — Other Ambulatory Visit: Payer: Self-pay | Admitting: *Deleted

## 2013-09-08 NOTE — Telephone Encounter (Signed)
Called pharmacy - they will route to PCP

## 2013-10-01 ENCOUNTER — Telehealth: Payer: Self-pay | Admitting: *Deleted

## 2013-10-01 NOTE — Telephone Encounter (Signed)
Returned call and pt verified x 2.  Pt informed message received.  Pt stated she had an echocardiogram and was told she has a leakage in her heart and she wants to see Dr. Claiborne Billings.  Pt informed there are no sooner appts than her appt on 6.3.15.  Offered appt w/ Extender and pt declined.  Pt advised to have echo report faxed to office to Wanda's attention for Dr. Claiborne Billings.  Informed Mariann Laster will be notified to have MD look at it since Dr. Claiborne Billings is out of the office next week.  Pt informed if anything urgent and requiring a sooner appt, then she will be notified.  Pt verbalized understanding and agreed w/ plan.  Message forwarded to Dr. Arnette Norris, CMA.

## 2013-10-01 NOTE — Telephone Encounter (Signed)
Pt was calling in regards to palpitations. She stated that she went to her PCP and had an EKG done and they found something. Her appointment is 6/3 with Dr. Claiborne Billings. I offered her an appointment with a PA and she did not want it. She wanted to see the doctor.  TK

## 2013-10-06 NOTE — Telephone Encounter (Signed)
Ok for pt to be seen as scheduled unless change in sx

## 2013-10-27 ENCOUNTER — Encounter: Payer: Self-pay | Admitting: Cardiovascular Disease

## 2013-10-27 ENCOUNTER — Ambulatory Visit (INDEPENDENT_AMBULATORY_CARE_PROVIDER_SITE_OTHER): Payer: Medicare Other | Admitting: Cardiovascular Disease

## 2013-10-27 VITALS — BP 116/65 | HR 58 | Ht 65.0 in | Wt 128.1 lb

## 2013-10-27 DIAGNOSIS — Q676 Pectus excavatum: Secondary | ICD-10-CM

## 2013-10-27 DIAGNOSIS — I447 Left bundle-branch block, unspecified: Secondary | ICD-10-CM

## 2013-10-27 DIAGNOSIS — Z87442 Personal history of urinary calculi: Secondary | ICD-10-CM

## 2013-10-27 DIAGNOSIS — F411 Generalized anxiety disorder: Secondary | ICD-10-CM

## 2013-10-27 DIAGNOSIS — R0789 Other chest pain: Secondary | ICD-10-CM | POA: Insufficient documentation

## 2013-10-27 DIAGNOSIS — F419 Anxiety disorder, unspecified: Secondary | ICD-10-CM | POA: Insufficient documentation
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
## 2013-10-27 NOTE — Patient Instructions (Signed)
Your physician recommends that you schedule a follow-up appointment in: 1 year. No changes were made today in your therapy. 

## 2013-12-21 ENCOUNTER — Emergency Department (HOSPITAL_COMMUNITY)
Admission: EM | Admit: 2013-12-21 | Discharge: 2013-12-21 | Disposition: A | Discharge status: Home / Self Care | Payer: Medicare Other | Attending: Emergency Medicine | Admitting: Emergency Medicine

## 2013-12-21 ENCOUNTER — Encounter (HOSPITAL_COMMUNITY): Payer: Self-pay | Admitting: Emergency Medicine

## 2013-12-21 ENCOUNTER — Emergency Department (HOSPITAL_COMMUNITY): Payer: Medicare Other

## 2013-12-21 DIAGNOSIS — X500XXA Overexertion from strenuous movement or load, initial encounter: Secondary | ICD-10-CM | POA: Diagnosis not present

## 2013-12-21 DIAGNOSIS — S99919A Unspecified injury of unspecified ankle, initial encounter: Secondary | ICD-10-CM

## 2013-12-21 DIAGNOSIS — I499 Cardiac arrhythmia, unspecified: Secondary | ICD-10-CM | POA: Diagnosis not present

## 2013-12-21 DIAGNOSIS — E039 Hypothyroidism, unspecified: Secondary | ICD-10-CM | POA: Diagnosis not present

## 2013-12-21 DIAGNOSIS — M069 Rheumatoid arthritis, unspecified: Secondary | ICD-10-CM | POA: Diagnosis not present

## 2013-12-21 DIAGNOSIS — Z872 Personal history of diseases of the skin and subcutaneous tissue: Secondary | ICD-10-CM | POA: Insufficient documentation

## 2013-12-21 DIAGNOSIS — S93402A Sprain of unspecified ligament of left ankle, initial encounter: Secondary | ICD-10-CM

## 2013-12-21 DIAGNOSIS — I2510 Atherosclerotic heart disease of native coronary artery without angina pectoris: Secondary | ICD-10-CM | POA: Diagnosis not present

## 2013-12-21 DIAGNOSIS — Y9389 Activity, other specified: Secondary | ICD-10-CM | POA: Diagnosis not present

## 2013-12-21 DIAGNOSIS — Z7982 Long term (current) use of aspirin: Secondary | ICD-10-CM | POA: Insufficient documentation

## 2013-12-21 DIAGNOSIS — Y929 Unspecified place or not applicable: Secondary | ICD-10-CM | POA: Insufficient documentation

## 2013-12-21 DIAGNOSIS — Z87442 Personal history of urinary calculi: Secondary | ICD-10-CM | POA: Insufficient documentation

## 2013-12-21 DIAGNOSIS — S99929A Unspecified injury of unspecified foot, initial encounter: Secondary | ICD-10-CM

## 2013-12-21 DIAGNOSIS — S8990XA Unspecified injury of unspecified lower leg, initial encounter: Secondary | ICD-10-CM | POA: Diagnosis present

## 2013-12-21 DIAGNOSIS — I059 Rheumatic mitral valve disease, unspecified: Secondary | ICD-10-CM | POA: Diagnosis not present

## 2013-12-21 DIAGNOSIS — S93409A Sprain of unspecified ligament of unspecified ankle, initial encounter: Secondary | ICD-10-CM | POA: Insufficient documentation

## 2013-12-21 DIAGNOSIS — K219 Gastro-esophageal reflux disease without esophagitis: Secondary | ICD-10-CM | POA: Insufficient documentation

## 2013-12-21 DIAGNOSIS — I447 Left bundle-branch block, unspecified: Secondary | ICD-10-CM | POA: Insufficient documentation

## 2013-12-21 DIAGNOSIS — Z79899 Other long term (current) drug therapy: Secondary | ICD-10-CM | POA: Diagnosis not present

## 2013-12-21 DIAGNOSIS — I4891 Unspecified atrial fibrillation: Secondary | ICD-10-CM | POA: Diagnosis not present

## 2013-12-21 DIAGNOSIS — Z880 Allergy status to penicillin: Secondary | ICD-10-CM | POA: Diagnosis not present

## 2013-12-21 DIAGNOSIS — R002 Palpitations: Secondary | ICD-10-CM | POA: Insufficient documentation

## 2013-12-21 NOTE — ED Notes (Signed)
Pt states that she stepped wrong yesterday while coming down steps, c/o pain to left ankle area today,

## 2013-12-21 NOTE — ED Notes (Signed)
Pt c/o left ankle pain since yesterday after stepping wrong on it

## 2013-12-21 NOTE — Discharge Instructions (Signed)
Your x-ray is negative for fracture or dislocation. Please continue to use her walker. Please use the ankle splint for the next 7 days. He will not need to sleep in this device. Please see your primary physician, or the orthopedic physician listed above if not improving. Ankle Sprain An ankle sprain is an injury to the strong, fibrous tissues (ligaments) that hold the bones of your ankle joint together.  CAUSES An ankle sprain is usually caused by a fall or by twisting your ankle. Ankle sprains most commonly occur when you step on the outer edge of your foot, and your ankle turns inward. People who participate in sports are more prone to these types of injuries.  SYMPTOMS   Pain in your ankle. The pain may be present at rest or only when you are trying to stand or walk.  Swelling.  Bruising. Bruising may develop immediately or within 1 to 2 days after your injury.  Difficulty standing or walking, particularly when turning corners or changing directions. DIAGNOSIS  Your caregiver will ask you details about your injury and perform a physical exam of your ankle to determine if you have an ankle sprain. During the physical exam, your caregiver will press on and apply pressure to specific areas of your foot and ankle. Your caregiver will try to move your ankle in certain ways. An X-ray exam may be done to be sure a bone was not broken or a ligament did not separate from one of the bones in your ankle (avulsion fracture).  TREATMENT  Certain types of braces can help stabilize your ankle. Your caregiver can make a recommendation for this. Your caregiver may recommend the use of medicine for pain. If your sprain is severe, your caregiver may refer you to a surgeon who helps to restore function to parts of your skeletal system (orthopedist) or a physical therapist. Delta ice to your injury for 1-2 days or as directed by your caregiver. Applying ice helps to reduce inflammation and  pain.  Put ice in a plastic bag.  Place a towel between your skin and the bag.  Leave the ice on for 15-20 minutes at a time, every 2 hours while you are awake.  Only take over-the-counter or prescription medicines for pain, discomfort, or fever as directed by your caregiver.  Elevate your injured ankle above the level of your heart as much as possible for 2-3 days.  If your caregiver recommends crutches, use them as instructed. Gradually put weight on the affected ankle. Continue to use crutches or a cane until you can walk without feeling pain in your ankle.  If you have a plaster splint, wear the splint as directed by your caregiver. Do not rest it on anything harder than a pillow for the first 24 hours. Do not put weight on it. Do not get it wet. You may take it off to take a shower or bath.  You may have been given an elastic bandage to wear around your ankle to provide support. If the elastic bandage is too tight (you have numbness or tingling in your foot or your foot becomes cold and blue), adjust the bandage to make it comfortable.  If you have an air splint, you may blow more air into it or let air out to make it more comfortable. You may take your splint off at night and before taking a shower or bath. Wiggle your toes in the splint several times per day to decrease  swelling. SEEK MEDICAL CARE IF:   You have rapidly increasing bruising or swelling.  Your toes feel extremely cold or you lose feeling in your foot.  Your pain is not relieved with medicine. SEEK IMMEDIATE MEDICAL CARE IF:  Your toes are numb or blue.  You have severe pain that is increasing. MAKE SURE YOU:   Understand these instructions.  Will watch your condition.  Will get help right away if you are not doing well or get worse. Document Released: 05/13/2005 Document Revised: 02/05/2012 Document Reviewed: 05/25/2011 [facility] Patient Information 2015 [place], [place]. This information is not intended to  replace advice given to you by your health care provider. Make sure you discuss any questions you have with your health care provider.

## 2013-12-21 NOTE — ED Provider Notes (Signed)
Medical screening examination/treatment/procedure(s) were performed by non-physician practitioner and as supervising physician I was immediately available for consultation/collaboration.   EKG Interpretation None        Maudry Diego, MD 12/21/13 1525

## 2013-12-21 NOTE — ED Provider Notes (Signed)
CSN: [id number]     Arrival date & time 12/21/13  1138 History   First MD Initiated Contact with Patient 12/21/13 1418     Chief Complaint  Patient presents with  . Ankle Pain     (Consider location/radiation/quality/duration/timing/severity/associated sxs/prior Treatment) HPI Comments: Patient is an 80 year old female who presents to the emergency department with complaint of left ankle pain. The patient states that she" r step" on yesterday while coming down steps and twisted her left ankle. She states she has pain each time she puts weight on it. She has not noted a great deal of swelling but states it is a bit puffy. She denies any other injury. She has a history of osteoporosis, request evaluation of her left ankle.  Patient is a 80 y.o. female presenting with ankle pain. The history is provided by the patient.  Ankle Pain Associated symptoms: no back pain and no neck pain     Past Medical History  Diagnosis Date  . History of kidney stones   . Rosacea   . MVP (mitral valve prolapse) MILD  . Right ureteral stone   . Coronary artery disease     CARDIOLOGIST- DR Rollene Fare-- LAST VISIT 04-04-11 (will request note)---   (per note cardiolite 2009 negative,  echo jan.2010  . GERD (gastroesophageal reflux disease)     CONTROLLED W/ ACIPHEX AND prn prevacid  . Edema occasional in ankles  . Hypothyroidism   . Dysrhythmia     HX SVT AND PAF--- occ. palpitations  . LBBB (left bundle branch block) CHRONIC  . H/O hiatal hernia   . RA (rheumatoid arthritis)     followed by dr Charlestine Night  . Atrial fibrillation   . Osteoporosis 12/2011     T score -2.7   Past Surgical History  Procedure Laterality Date  . Moles excised  2012    on nose  . Extracorporeal shock wave lithotripsy  09-24-10    and 1990  . Cataract extraction w/ intraocular lens  implant, bilateral  4 yrs ago    both eyes  . Knee arthroscopy  04/17/2011, right    Procedure: ARTHROSCOPY KNEE;  Surgeon: Dione Plover Aluisio;   Location: [facility];  Service: Orthopedics;  Laterality: Right;  WITH LATERAL MENISCAL DEBRIDEMENT  . Cholecystectomy  2005  . Total knee arthroplasty  03/23/2012    Procedure: TOTAL KNEE ARTHROPLASTY;  Surgeon: Gearlean Alf, MD;  Location: [facility];  Service: Orthopedics;  Laterality: Right;  . Hip arthroplasty Right 10/12/2012    Procedure: ARTHROPLASTY BIPOLAR HIP;  Surgeon: Gearlean Alf, MD;  Location: [facility];  Service: Orthopedics;  Laterality: Right;  . Vaginal hysterectomy  1970    Leiomyomata   Family History  Problem Relation Age of Onset  . Cancer Mother     COLON  . Heart disease Father     STROKE  . Stroke Father   . Ovarian cancer Maternal Grandmother    History  Substance Use Topics  . Smoking status: Never Smoker   . Smokeless tobacco: Never Used  . Alcohol Use: No   OB History   Grav Para Term Preterm Abortions TAB SAB Ect Mult Living   2 2 2       2      Review of Systems  Constitutional: Negative for activity change.       All ROS Neg except as noted in HPI  HENT: Negative for nosebleeds.   Eyes: Negative for photophobia and discharge.  Respiratory:  Negative for cough, shortness of breath and wheezing.   Cardiovascular: Positive for palpitations. Negative for chest pain.  Gastrointestinal: Negative for abdominal pain and blood in stool.  Genitourinary: Negative for dysuria, frequency and hematuria.  Musculoskeletal: Positive for joint swelling. Negative for arthralgias, back pain and neck pain.  Skin: Negative.   Neurological: Negative for dizziness, seizures and speech difficulty.  Psychiatric/Behavioral: Negative for hallucinations and confusion.      Allergies  Contrast media; Penicillins; and Ciprofloxacin  Home Medications   Prior to Admission medications   Medication Sig Start Date End Date Taking? Authorizing Provider  aspirin EC 81 MG tablet Take 81 mg by mouth every morning.   Yes Historical Provider, MD   Calcium-Vitamin D-Vitamin K (CALCIUM SOFT CHEWS) 500-100-40 MG-UNT-MCG CHEW Chew by mouth daily.   Yes Historical Provider, MD  clonazePAM (KLONOPIN) 0.5 MG tablet Take 1 tablet by mouth at bedtime. 10/20/13  Yes Historical Provider, MD  digoxin (LANOXIN) 0.125 MG tablet Take 0.5 tablets (0.0625 mg total) by mouth every morning. 07/09/13  Yes Troy Sine, MD  HYDROcodone-acetaminophen [facility]) 5-325 MG per tablet 1/2 or 1 tab every 4 hours as needed for pain 04/20/13  Yes Lenox Ahr, PA-C  levothyroxine (SYNTHROID, LEVOTHROID) 75 MCG tablet Take 1 tablet by mouth every day. Please contact primacy doctor for future refills. 03/05/13  Yes Lorretta Harp, MD  Multiple Vitamin (MULTIVITAMIN WITH MINERALS) TABS tablet Take 1 tablet by mouth daily.   Yes Historical Provider, MD  MYRBETRIQ 50 MG TB24 tablet Take 1 tablet by mouth daily. 10/05/13  Yes Historical Provider, MD  pantoprazole (PROTONIX) 40 MG tablet Take 40 mg by mouth daily.   Yes Historical Provider, MD   BP 103/47  Pulse 73  Temp(Src) 97.4 F (36.3 C) (Oral)  Resp 18  Ht 5\' 5"  (1.651 m)  Wt 125 lb (56.7 kg)  BMI 20.80 kg/m2  SpO2 99% Physical Exam  Nursing note and vitals reviewed. Constitutional: She is oriented to person, place, and time. She appears well-developed and well-nourished.  Non-toxic appearance.  HENT:  Head: Normocephalic.  Right Ear: Tympanic membrane and external ear normal.  Left Ear: Tympanic membrane and external ear normal.  Eyes: EOM and lids are normal. Pupils are equal, round, and reactive to light.  Neck: Normal range of motion. Neck supple. Carotid bruit is not present.  Cardiovascular: Normal rate, regular rhythm, normal heart sounds, intact distal pulses and normal pulses.   Pulmonary/Chest: Breath sounds normal. No respiratory distress.  Abdominal: Soft. Bowel sounds are normal. There is no tenderness. There is no guarding.  Musculoskeletal: Normal range of motion.  There is good range of  motion of the toes of the left foot. The dorsalis pedis pulses 2+. There is mild to moderate lateral malleolus tenderness. The Achilles tendon is intact. There is no deformity of the tibial fibula area. There is good range of motion of the left knee and hip.  Lymphadenopathy:       Head (right side): No submandibular adenopathy present.       Head (left side): No submandibular adenopathy present.    She has no cervical adenopathy.  Neurological: She is alert and oriented to person, place, and time. She has normal strength. No cranial nerve deficit or sensory deficit.  Skin: Skin is warm and dry.  Psychiatric: She has a normal mood and affect. Her speech is normal.    ED Course  Procedures (including critical care time) Labs Review Labs Reviewed - No  data to display  Imaging Review Dg Ankle Complete Left  12/21/2013   CLINICAL DATA:  Left ankle pain, twisting injury yesterday  EXAM: LEFT ANKLE COMPLETE - 3+ VIEW  COMPARISON:  None.  FINDINGS: Three views of left ankle submitted. No acute fracture or subluxation. There is diffuse osteopenia. Ankle mortise is preserved.  IMPRESSION: No acute fracture or subluxation.  Diffuse osteopenia.   Electronically Signed   By: Lahoma Crocker M.D.   On: 12/21/2013 12:36     EKG Interpretation None      MDM X-ray of the left ankle is negative for fracture or dislocation. The patient is fitted with a ankle stirrup splint. The patient has a walker to use. Patient is advised to continue the use of the walker. To elevate the ankle is much as possible. She will see her primary physician, or Dr. Luna Glasgow if not improving.    Final diagnoses:  None    *I have reviewed nursing notes, vital signs, and all appropriate lab and imaging results for this patient.Lenox Ahr, PA-C 12/21/13 1451

## 2014-01-24 ENCOUNTER — Encounter: Payer: Medicare Other | Admitting: Gynecology

## 2014-03-15 ENCOUNTER — Encounter: Payer: Self-pay | Admitting: Gynecology

## 2014-03-15 ENCOUNTER — Ambulatory Visit (INDEPENDENT_AMBULATORY_CARE_PROVIDER_SITE_OTHER): Payer: Medicare Other | Admitting: Gynecology

## 2014-03-15 VITALS — BP 124/80 | Ht 65.0 in | Wt 127.0 lb

## 2014-03-15 DIAGNOSIS — M810 Age-related osteoporosis without current pathological fracture: Secondary | ICD-10-CM

## 2014-03-15 DIAGNOSIS — N83202 Unspecified ovarian cyst, left side: Secondary | ICD-10-CM

## 2014-03-15 DIAGNOSIS — N8320 Unspecified ovarian cysts: Secondary | ICD-10-CM

## 2014-03-15 DIAGNOSIS — N83201 Unspecified ovarian cyst, right side: Secondary | ICD-10-CM

## 2014-03-15 DIAGNOSIS — N952 Postmenopausal atrophic vaginitis: Secondary | ICD-10-CM

## 2014-03-15 NOTE — Patient Instructions (Signed)
Office will contact you to arrange the Prolia injections for your bones.

## 2014-03-15 NOTE — Progress Notes (Signed)
Debra Richards 03-Sep-1925 [id number]        80 y.o.  G2P2002 for follow up exam. Several issues noted below.  Past medical history,surgical history, problem list, medications, allergies, family history and social history were all reviewed and documented as reviewed in the EPIC chart.  ROS:  12 system ROS performed with pertinent positives and negatives included in the history, assessment and plan.   Additional significant findings :  none   Exam: Kim Counsellor Vitals:   03/15/14 1203  BP: 124/80  Height: 5\' 5"  (1.651 m)  Weight: 127 lb (57.607 kg)   General appearance:  Normal affect, orientation and appearance. Skin: Grossly normal HEENT: Without gross lesions.  No cervical or supraclavicular adenopathy. Thyroid normal.  Lungs:  Clear without wheezing, rales or rhonchi Cardiac: RR, without RMG Abdominal:  Soft, nontender, without masses, guarding, rebound, organomegaly or hernia Breasts:  Examined lying and sitting without masses, retractions, discharge or axillary adenopathy. Pelvic:  Ext/BUS/vagina with generalized atrophic changes  Adnexa  Without masses or tenderness    Anus and perineum  Normal   Rectovaginal  Normal sphincter tone without palpated masses or tenderness.    Assessment/Plan:  80 y.o. [id number] female for follow up exam.   1. Postmenopausal/atrophic genital changes. Status post [facility] for leiomyoma.  Patient without significant symptoms of hot flushes, night sweats, vaginal dryness. Is not sexually active. Will continue to monitor. 2. History of ovarian cysts. Followed with serial ultrasounds. Have remained stable over several year follow up. Exam today is normal.  We both agree to stop screening as the likelihood of malignancy extremely low she is comfortable with this. 3. Osteoporosis. DEXA 12/2011 with T score -2.7.  Has history of fractures previously. Also has rheumatoid arthritis. We had planned on initiating Prolia and had made arrangements for  so but she never followed up to do this. I again encouraged her to consider this. I reviewed again the risks of osteonecrosis of the jaw, atypical fractures, rashes and increased risk of infections. Patient wants to proceed with this and we will make arrangements. 4. Pap smear 2011. No Pap smear done today. Status post hysterectomy for benign indications. Age 70. We both agree to stop screening per current screening guidelines. 5. Colonoscopy 2011. Repeat at their recommended interval. 6. Mammography overdo with last mammography 2013. Patient knows she needs to schedule an agrees to do so. SBE monthly reviewed. 7. Health maintenance. No routine lab work done and she has this done through her primary physician's office.  Follow up for Prolia otherwise one year, sooner as needed.   [name] MD, 12:38 PM 03/15/2014

## 2014-03-28 ENCOUNTER — Encounter: Payer: Self-pay | Admitting: Gynecology

## 2014-03-29 ENCOUNTER — Telehealth: Payer: Self-pay | Admitting: *Deleted

## 2014-03-29 DIAGNOSIS — M810 Age-related osteoporosis without current pathological fracture: Secondary | ICD-10-CM

## 2014-03-29 NOTE — Telephone Encounter (Signed)
LM on home number for pt to call back for Prolia benefits - Medicare covers and secondary covers - Secondary picks up what Medicare doesn't pay which is the deductible if not met. Pt needs calcium and BUN/creatinine before having Prolia injection per Dr Phineas Real. KW CMA

## 2014-03-30 NOTE — Telephone Encounter (Signed)
Pt called and Butch Penny spoke her based on my message. The patient advised will come next week for bloodwork and then set up a time to have Prolia injection. KW CMA

## 2014-04-05 ENCOUNTER — Other Ambulatory Visit: Payer: Medicare Other

## 2014-04-05 DIAGNOSIS — M810 Age-related osteoporosis without current pathological fracture: Secondary | ICD-10-CM

## 2014-04-05 LAB — BUN: BUN: 18 mg/dL (ref 6–23)

## 2014-04-05 LAB — CALCIUM: Calcium: 9.2 mg/dL (ref 8.4–10.5)

## 2014-04-05 LAB — CREATININE, SERUM: CREATININE: 0.83 mg/dL (ref 0.50–1.10)

## 2014-04-12 NOTE — Telephone Encounter (Signed)
Pt called coming tomorrow for Prolia. KW

## 2014-04-13 ENCOUNTER — Ambulatory Visit (INDEPENDENT_AMBULATORY_CARE_PROVIDER_SITE_OTHER): Payer: Medicare Other | Admitting: Gynecology

## 2014-04-13 DIAGNOSIS — M810 Age-related osteoporosis without current pathological fracture: Secondary | ICD-10-CM

## 2014-04-13 MED ORDER — DENOSUMAB 60 MG/ML ~~LOC~~ SOLN
60.0000 mg | Freq: Once | SUBCUTANEOUS | Status: AC
  Administered 2014-04-13: 60 mg via SUBCUTANEOUS

## 2014-07-07 ENCOUNTER — Other Ambulatory Visit: Payer: Self-pay | Admitting: Cardiovascular Disease

## 2014-07-07 NOTE — Telephone Encounter (Signed)
Rx(s) sent to pharmacy electronically.  

## 2014-08-04 ENCOUNTER — Other Ambulatory Visit: Payer: Self-pay | Admitting: Cardiovascular Disease

## 2014-08-04 NOTE — Telephone Encounter (Signed)
Rx refill denied to patient pharmacy

## 2014-10-04 ENCOUNTER — Telehealth: Payer: Self-pay | Admitting: Gynecology

## 2014-10-04 NOTE — Telephone Encounter (Addendum)
Benefits received for Prolia injection. Subject to Dedub $166.00 after deductible  $0 cost to pt (secondary insurance covers deductible ) . Calcium  9.2  04/05/2014 . Appointment 11/01/2014 at 2:30 pm. Pt also requested a pelvic ultrasound due to past ultrasound exam.  I explained that  I will send  Dr. Phineas Real her request. I also sent a copy of this message to [facility]. Last Prolia injection 04/13/2014

## 2014-10-05 NOTE — Telephone Encounter (Signed)
Explained note from Dr Phineas Real regarding ultrasound.  Patient understands. She will come in on June 7 for Prolia.

## 2014-10-05 NOTE — Telephone Encounter (Signed)
Ok I will call her

## 2014-10-05 NOTE — Telephone Encounter (Signed)
We have discussed her ultrasounds before and per the 2015 office note we both agreed to stop screening with ultrasounds as they have remained stable over years observation.

## 2014-11-01 ENCOUNTER — Ambulatory Visit (INDEPENDENT_AMBULATORY_CARE_PROVIDER_SITE_OTHER): Payer: Medicare Other | Admitting: *Deleted

## 2014-11-01 DIAGNOSIS — M810 Age-related osteoporosis without current pathological fracture: Secondary | ICD-10-CM

## 2014-11-01 MED ORDER — DENOSUMAB 60 MG/ML ~~LOC~~ SOLN
60.0000 mg | Freq: Once | SUBCUTANEOUS | Status: AC
Start: 2014-11-01 — End: 2014-11-01
  Administered 2014-11-01: 60 mg via SUBCUTANEOUS

## 2014-11-01 NOTE — Telephone Encounter (Signed)
Received her Prolia injection today. Next Prolia injection due after 05/04/15. She will also need repeat Calcium level due after 04/15/15.

## 2014-11-30 ENCOUNTER — Emergency Department (HOSPITAL_COMMUNITY): Payer: Medicare Other

## 2014-11-30 ENCOUNTER — Emergency Department (HOSPITAL_COMMUNITY)
Admission: EM | Admit: 2014-11-30 | Discharge: 2014-11-30 | Disposition: A | Discharge status: Home / Self Care | Payer: Medicare Other | Attending: Emergency Medicine | Admitting: Emergency Medicine

## 2014-11-30 ENCOUNTER — Encounter (HOSPITAL_COMMUNITY): Payer: Self-pay | Admitting: Emergency Medicine

## 2014-11-30 ENCOUNTER — Other Ambulatory Visit: Payer: Self-pay | Admitting: Cardiovascular Disease

## 2014-11-30 DIAGNOSIS — K219 Gastro-esophageal reflux disease without esophagitis: Secondary | ICD-10-CM | POA: Insufficient documentation

## 2014-11-30 DIAGNOSIS — Z8719 Personal history of other diseases of the digestive system: Secondary | ICD-10-CM | POA: Insufficient documentation

## 2014-11-30 DIAGNOSIS — R410 Disorientation, unspecified: Secondary | ICD-10-CM | POA: Insufficient documentation

## 2014-11-30 DIAGNOSIS — J209 Acute bronchitis, unspecified: Secondary | ICD-10-CM | POA: Insufficient documentation

## 2014-11-30 DIAGNOSIS — Z880 Allergy status to penicillin: Secondary | ICD-10-CM | POA: Diagnosis not present

## 2014-11-30 DIAGNOSIS — I4891 Unspecified atrial fibrillation: Secondary | ICD-10-CM | POA: Diagnosis not present

## 2014-11-30 DIAGNOSIS — Z872 Personal history of diseases of the skin and subcutaneous tissue: Secondary | ICD-10-CM | POA: Diagnosis not present

## 2014-11-30 DIAGNOSIS — Z87442 Personal history of urinary calculi: Secondary | ICD-10-CM | POA: Insufficient documentation

## 2014-11-30 DIAGNOSIS — J40 Bronchitis, not specified as acute or chronic: Secondary | ICD-10-CM

## 2014-11-30 DIAGNOSIS — R61 Generalized hyperhidrosis: Secondary | ICD-10-CM | POA: Diagnosis not present

## 2014-11-30 DIAGNOSIS — Z8619 Personal history of other infectious and parasitic diseases: Secondary | ICD-10-CM | POA: Insufficient documentation

## 2014-11-30 DIAGNOSIS — I2510 Atherosclerotic heart disease of native coronary artery without angina pectoris: Secondary | ICD-10-CM | POA: Diagnosis not present

## 2014-11-30 DIAGNOSIS — E039 Hypothyroidism, unspecified: Secondary | ICD-10-CM | POA: Diagnosis not present

## 2014-11-30 DIAGNOSIS — Z7982 Long term (current) use of aspirin: Secondary | ICD-10-CM | POA: Diagnosis not present

## 2014-11-30 DIAGNOSIS — Z79899 Other long term (current) drug therapy: Secondary | ICD-10-CM | POA: Diagnosis not present

## 2014-11-30 DIAGNOSIS — M069 Rheumatoid arthritis, unspecified: Secondary | ICD-10-CM | POA: Insufficient documentation

## 2014-11-30 DIAGNOSIS — R42 Dizziness and giddiness: Secondary | ICD-10-CM | POA: Diagnosis present

## 2014-11-30 LAB — CBC WITH DIFFERENTIAL/PLATELET
BASOS PCT: 0 % (ref 0–1)
Basophils Absolute: 0 10*3/uL (ref 0.0–0.1)
EOS ABS: 0.1 10*3/uL (ref 0.0–0.7)
Eosinophils Relative: 1 % (ref 0–5)
HEMATOCRIT: 38.7 % (ref 36.0–46.0)
HEMOGLOBIN: 12.8 g/dL (ref 12.0–15.0)
LYMPHS ABS: 1 10*3/uL (ref 0.7–4.0)
Lymphocytes Relative: 17 % (ref 12–46)
MCH: 32.4 pg (ref 26.0–34.0)
MCHC: 33.1 g/dL (ref 30.0–36.0)
MCV: 98 fL (ref 78.0–100.0)
Monocytes Absolute: 0.4 10*3/uL (ref 0.1–1.0)
Monocytes Relative: 6 % (ref 3–12)
Neutro Abs: 4.6 10*3/uL (ref 1.7–7.7)
Neutrophils Relative %: 76 % (ref 43–77)
Platelets: 214 10*3/uL (ref 150–400)
RBC: 3.95 MIL/uL (ref 3.87–5.11)
RDW: 12.7 % (ref 11.5–15.5)
WBC: 6 10*3/uL (ref 4.0–10.5)

## 2014-11-30 LAB — COMPREHENSIVE METABOLIC PANEL
ALBUMIN: 3.2 g/dL — AB (ref 3.5–5.0)
ALT: 15 U/L (ref 14–54)
ANION GAP: 7 (ref 5–15)
AST: 20 U/L (ref 15–41)
Alkaline Phosphatase: 56 U/L (ref 38–126)
BILIRUBIN TOTAL: 0.4 mg/dL (ref 0.3–1.2)
BUN: 13 mg/dL (ref 6–20)
CALCIUM: 8.9 mg/dL (ref 8.9–10.3)
CHLORIDE: 107 mmol/L (ref 101–111)
CO2: 28 mmol/L (ref 22–32)
Creatinine, Ser: 0.76 mg/dL (ref 0.44–1.00)
GFR calc Af Amer: >60 mL/min (ref >60)
GFR calc non Af Amer: >60 mL/min (ref >60)
Glucose, Bld: 77 mg/dL (ref 65–99)
Potassium: 3.7 mmol/L (ref 3.5–5.1)
Sodium: 142 mmol/L (ref 135–145)
Total Protein: 6.5 g/dL (ref 6.5–8.1)

## 2014-11-30 LAB — URINALYSIS, ROUTINE W REFLEX MICROSCOPIC
BILIRUBIN URINE: NEGATIVE
GLUCOSE, UA: NEGATIVE mg/dL
HGB URINE DIPSTICK: NEGATIVE
Ketones, ur: NEGATIVE mg/dL
Nitrite: NEGATIVE
PH: 6 (ref 5.0–8.0)
Protein, ur: NEGATIVE mg/dL
Specific Gravity, Urine: <1.005 — ABNORMAL LOW (ref 1.005–1.030)
Urobilinogen, UA: 0.2 mg/dL (ref 0.0–1.0)

## 2014-11-30 LAB — URINE MICROSCOPIC-ADD ON

## 2014-11-30 LAB — TROPONIN I: Troponin I: <0.03 ng/mL (ref <0.031)

## 2014-11-30 LAB — DIGOXIN LEVEL: Digoxin Level: <0.2 ng/mL — ABNORMAL LOW (ref 0.8–2.0)

## 2014-11-30 LAB — TSH: TSH: 0.352 u[IU]/mL (ref 0.350–4.500)

## 2014-11-30 LAB — I-STAT CG4 LACTIC ACID, ED: LACTIC ACID, VENOUS: 1.74 mmol/L (ref 0.5–2.0)

## 2014-11-30 MED ORDER — LEVOFLOXACIN 750 MG PO TABS: 750.0000 mg | ORAL_TABLET | Freq: Every day | ORAL | Status: DC

## 2014-11-30 MED ORDER — ALBUTEROL SULFATE HFA 108 (90 BASE) MCG/ACT IN AERS: 2.0000 | INHALATION_SPRAY | RESPIRATORY_TRACT | Status: DC | PRN

## 2014-11-30 NOTE — ED Provider Notes (Addendum)
CSN: [id number]     Arrival date & time 11/30/14  1041 History  This chart was scribed for Debra Greek, MD by Starleen Arms, ED Scribe. This patient was seen in room APA01/APA01 and the patient's care was started at 10:47 AM.   Chief Complaint  Patient presents with  . Dizziness   The history is provided by the patient. No language interpreter was used.    HPI Comments: Debra Richards is a 81 y.o. female who presents to the Emergency Department complaining of dizziness onset today.  Over the past 4 weeks the patient has had some nasal congestion and recently developed a slight, dry cough.  She has been seen by her PCP four times and received two cortisone injections.  She was recently referred to an ENT and has an appointment scheduled in August.  The patient also has a distant history of urosepsis that manifested with similar dizziness and absence of urinary symptoms as today.  Due to this, the patient and her daughter decided to come to the ED.  The patient also complains of a hoarse voice and an episode of diaphoresis last night while sleeping.  She has taken mucinex.  She denies vomiting, diarrhea.     Past Medical History  Diagnosis Date  . History of kidney stones   . Rosacea   . MVP (mitral valve prolapse) MILD  . Right ureteral stone   . Coronary artery disease     CARDIOLOGIST- DR Rollene Fare-- LAST VISIT 04-04-11 (will request note)---   (per note cardiolite 2009 negative,  echo jan.2010  . GERD (gastroesophageal reflux disease)     CONTROLLED W/ ACIPHEX AND prn prevacid  . Edema occasional in ankles  . Hypothyroidism   . Dysrhythmia     HX SVT AND PAF--- occ. palpitations  . LBBB (left bundle branch block) CHRONIC  . H/O hiatal hernia   . RA (rheumatoid arthritis)     followed by dr Charlestine Night  . Atrial fibrillation   . Osteoporosis 12/2011     T score -2.7   Past Surgical History  Procedure Laterality Date  . Moles excised  2012    on nose  . Extracorporeal  shock wave lithotripsy  09-24-10    and 1990  . Cataract extraction w/ intraocular lens  implant, bilateral  4 yrs ago    both eyes  . Knee arthroscopy  04/17/2011, right    Procedure: ARTHROSCOPY KNEE;  Surgeon: Dione Plover Aluisio;  Location: [facility];  Service: Orthopedics;  Laterality: Right;  WITH LATERAL MENISCAL DEBRIDEMENT  . Cholecystectomy  2005  . Total knee arthroplasty  03/23/2012    Procedure: TOTAL KNEE ARTHROPLASTY;  Surgeon: Gearlean Alf, MD;  Location: [facility];  Service: Orthopedics;  Laterality: Right;  . Hip arthroplasty Right 10/12/2012    Procedure: ARTHROPLASTY BIPOLAR HIP;  Surgeon: Gearlean Alf, MD;  Location: [facility];  Service: Orthopedics;  Laterality: Right;  . Vaginal hysterectomy  1970    Leiomyomata  . Femur fx    . Sternum fx     Family History  Problem Relation Age of Onset  . Cancer Mother     COLON  . Heart disease Father     STROKE  . Stroke Father   . Ovarian cancer Maternal Grandmother   . Crohn's disease Daughter    History  Substance Use Topics  . Smoking status: Never Smoker   . Smokeless tobacco: Never Used  . Alcohol Use: No  OB History    Gravida Para Term Preterm AB TAB SAB Ectopic Multiple Living   2 2 2       2      Review of Systems  HENT: Positive for congestion and voice change.   Respiratory: Positive for cough.   Neurological: Positive for dizziness.  All other systems reviewed and are negative.     Allergies  Contrast media; Penicillins; and Ciprofloxacin  Home Medications   Prior to Admission medications   Medication Sig Start Date End Date Taking? Authorizing Provider  aspirin EC 81 MG tablet Take 81 mg by mouth every morning.   Yes Historical Provider, MD  clorazepate (TRANXENE) 7.5 MG tablet Take 7.5 mg by mouth 2 (two) times daily as needed for anxiety.   Yes Historical Provider, MD  digoxin (DIGOX) 0.125 MG tablet Take 0.5 tablets (0.0625 mg total) by mouth daily. <PLEASE MAKE  APPOINTMENT FOR REFILLS> 11/30/14  Yes Troy Sine, MD  levothyroxine (SYNTHROID, LEVOTHROID) 75 MCG tablet Take 1 tablet by mouth every day. Please contact primacy doctor for future refills. 03/05/13  Yes Lorretta Harp, MD  Multiple Vitamin (MULTIVITAMIN WITH MINERALS) TABS tablet Take 1 tablet by mouth daily.   Yes Historical Provider, MD  Omega-3 Fatty Acids (FISH OIL PO) Take 1 capsule by mouth daily.   Yes Historical Provider, MD  pantoprazole (PROTONIX) 40 MG tablet Take 80 mg by mouth daily.    Yes Historical Provider, MD  vitamin E (VITAMIN E) 1000 UNIT capsule Take 1,000 Units by mouth daily.   Yes Historical Provider, MD   BP 115/55 mmHg  Pulse 56  Temp(Src) 97.5 F (36.4 C) (Oral)  Resp 16  Ht 5\' 8"  (1.727 m)  Wt 120 lb (54.432 kg)  BMI 18.25 kg/m2  SpO2 100% Physical Exam  Constitutional: She is oriented to person, place, and time. She appears well-developed and well-nourished. No distress.  HENT:  Head: Normocephalic and atraumatic.  Right Ear: Hearing normal.  Left Ear: Hearing normal.  Nose: Nose normal.  Mouth/Throat: Oropharynx is clear and moist and mucous membranes are normal.  Eyes: Conjunctivae and EOM are normal. Pupils are equal, round, and reactive to light.  Neck: Normal range of motion. Neck supple.  Cardiovascular: Regular rhythm, S1 normal and S2 normal.  Exam reveals no gallop and no friction rub.   No murmur heard. Pulmonary/Chest: Effort normal and breath sounds normal. No respiratory distress. She exhibits no tenderness.  Abdominal: Soft. Normal appearance and bowel sounds are normal. There is no hepatosplenomegaly. There is no tenderness. There is no rebound, no guarding, no tenderness at McBurney's point and negative Murphy's sign. No hernia.  Musculoskeletal: Normal range of motion.  Neurological: She is alert and oriented to person, place, and time. She has normal strength. No cranial nerve deficit or sensory deficit. Coordination normal. GCS eye  subscore is 4. GCS verbal subscore is 5. GCS motor subscore is 6.  Skin: Skin is warm, dry and intact. No rash noted. No cyanosis.  Psychiatric: She has a normal mood and affect. Her speech is normal and behavior is normal. Thought content normal.  Nursing note and vitals reviewed.   ED Course  Procedures (including critical care time)  DIAGNOSTIC STUDIES: Oxygen Saturation is 98% on RA, normal by my interpretation.    COORDINATION OF CARE:  11:00 AM Discussed treatment plan with patient at bedside.  Patient acknowledges and agrees with plan.    Labs Review Labs Reviewed  COMPREHENSIVE METABOLIC PANEL - Abnormal;  Notable for the following:    Albumin 3.2 (*)    All other components within normal limits  URINALYSIS, ROUTINE W REFLEX MICROSCOPIC (NOT AT [facility]) - Abnormal; Notable for the following:    Specific Gravity, Urine <1.005 (*)    Leukocytes, UA TRACE (*)    All other components within normal limits  DIGOXIN LEVEL - Abnormal; Notable for the following:    Digoxin Level <0.2 (*)    All other components within normal limits  URINE CULTURE  CBC WITH DIFFERENTIAL/PLATELET  TROPONIN I  TSH  URINE MICROSCOPIC-ADD ON  I-STAT CG4 LACTIC ACID, ED    Imaging Review Dg Chest 2 View  11/30/2014   CLINICAL DATA:  Dizziness and confusion starting yesterday worse today, recent UTI, history coronary artery disease, atrial fibrillation, rheumatoid arthritis  EXAM: CHEST  2 VIEW  COMPARISON:  02/28/2013  FINDINGS: Normal heart size, mediastinal contours and pulmonary vascularity.  Emphysematous and bronchitic changes consistent with COPD.  Biapical scarring unchanged.  Minimal chronic interstitial prominence, stable.  No definite acute infiltrate, pleural effusion or pneumothorax.  Bones appear demineralized.  IMPRESSION: COPD changes with biapical scarring and chronic interstitial prominence.  No definite acute abnormalities.   Electronically Signed   By: Lavonia Dana M.D.   On: 11/30/2014  11:49   Ct Head Wo Contrast  11/30/2014   CLINICAL DATA:  Confusion.  Dizziness for 4 days.  Recent UTI.  EXAM: CT HEAD WITHOUT CONTRAST  TECHNIQUE: Contiguous axial images were obtained from the base of the skull through the vertex without intravenous contrast.  COMPARISON:  04/20/2013  FINDINGS: There is no evidence of acute cortical infarct, intracranial hemorrhage, mass, midline shift, or extra-axial fluid collection. Mild cerebral atrophy is unchanged. Mild periventricular white matter hypodensities are unchanged and nonspecific but compatible with mild chronic small vessel ischemic disease.  Prior bilateral cataract extraction is noted. Mastoid air cells and visualized paranasal sinuses are clear.  IMPRESSION: 1. No evidence of acute intracranial abnormality. 2. Mild cerebral atrophy and chronic small vessel ischemic disease.   Electronically Signed   By: Logan Bores   On: 11/30/2014 12:13     EKG Interpretation None      MDM   Final diagnoses:  Confusion  Confusion   bronchitis  Patient presents to the ER with multiple problems. Patient has been sick for 4 weeks with cough, congestion, hoarse voice. She has been having nasal congestion and dizziness. She is due to see ENT next week. Daughter thought she was confused this morning and brought her to the ER because she has a history of urosepsis with similar symptoms. Patient's vital signs are normal. She is not febrile. Her workup has been entirely unremarkable. She does not have a leukocytosis. Urinalysis was essentially clear. Patient has been experiencing progressively worsening cough. Chest x-ray did not show pneumonia. She reports that she has been having chills and sweats at night the last 2 nights. Cannot rule out intermittent fever. Will treat empirically with Levaquin. Follow-up with PCP.  I personally performed the services described in this documentation, which was scribed in my presence. The recorded information has been reviewed  and is accurate.  Addendum: Rash indicates history of rash with Cipro, but daughter reports that this is not correct. Patient has taken Cipro and Levaquin without problems in the past.    Debra Greek, MD 11/30/14 [place], MD 11/30/14 1359

## 2014-11-30 NOTE — Telephone Encounter (Signed)
Rx(s) sent to pharmacy electronically.  

## 2014-11-30 NOTE — ED Notes (Signed)
Gave pt water to drink with MD Pollina approval.

## 2014-11-30 NOTE — ED Notes (Addendum)
Pt reports dizziness,hoarseness,cough,unsteady gait x4 weeks. Pt reports history of same when diagnosed with sepsis.pt alert. Speech clear. Mild unsteady gait noted.pt reports scheduled to see Dr. Benjamine Mola in august but reports "i can't wait that long."

## 2014-11-30 NOTE — Discharge Instructions (Signed)

## 2014-11-30 NOTE — ED Notes (Signed)
Pt made aware a urine sample was needed.

## 2014-11-30 NOTE — ED Notes (Signed)
Pt assisted to bathroom. Slight unsteady gait

## 2014-12-03 LAB — URINE CULTURE: Culture: 40000

## 2014-12-06 ENCOUNTER — Telehealth: Payer: Self-pay | Admitting: *Deleted

## 2014-12-06 NOTE — ED Notes (Signed)
(+)  urine culture, treated with Levofloxacin, Ok per Cherlynn Polo, Pharm

## 2014-12-07 ENCOUNTER — Encounter: Payer: Self-pay | Admitting: *Deleted

## 2014-12-29 ENCOUNTER — Encounter: Payer: Self-pay | Admitting: Cardiovascular Disease

## 2015-01-01 ENCOUNTER — Emergency Department (HOSPITAL_COMMUNITY)
Admission: EM | Admit: 2015-01-01 | Discharge: 2015-01-02 | Disposition: A | Discharge status: Home / Self Care | Payer: Medicare Other | Attending: Emergency Medicine | Admitting: Emergency Medicine

## 2015-01-01 ENCOUNTER — Encounter (HOSPITAL_COMMUNITY): Payer: Self-pay | Admitting: Emergency Medicine

## 2015-01-01 DIAGNOSIS — Z872 Personal history of diseases of the skin and subcutaneous tissue: Secondary | ICD-10-CM | POA: Diagnosis not present

## 2015-01-01 DIAGNOSIS — Z7982 Long term (current) use of aspirin: Secondary | ICD-10-CM | POA: Diagnosis not present

## 2015-01-01 DIAGNOSIS — R5383 Other fatigue: Secondary | ICD-10-CM | POA: Diagnosis not present

## 2015-01-01 DIAGNOSIS — I2510 Atherosclerotic heart disease of native coronary artery without angina pectoris: Secondary | ICD-10-CM | POA: Insufficient documentation

## 2015-01-01 DIAGNOSIS — D72819 Decreased white blood cell count, unspecified: Secondary | ICD-10-CM

## 2015-01-01 DIAGNOSIS — Z87442 Personal history of urinary calculi: Secondary | ICD-10-CM | POA: Diagnosis not present

## 2015-01-01 DIAGNOSIS — Z87448 Personal history of other diseases of urinary system: Secondary | ICD-10-CM | POA: Insufficient documentation

## 2015-01-01 DIAGNOSIS — K219 Gastro-esophageal reflux disease without esophagitis: Secondary | ICD-10-CM | POA: Insufficient documentation

## 2015-01-01 DIAGNOSIS — Z79899 Other long term (current) drug therapy: Secondary | ICD-10-CM | POA: Insufficient documentation

## 2015-01-01 DIAGNOSIS — E039 Hypothyroidism, unspecified: Secondary | ICD-10-CM | POA: Diagnosis not present

## 2015-01-01 DIAGNOSIS — Z880 Allergy status to penicillin: Secondary | ICD-10-CM | POA: Diagnosis not present

## 2015-01-01 DIAGNOSIS — M069 Rheumatoid arthritis, unspecified: Secondary | ICD-10-CM | POA: Insufficient documentation

## 2015-01-01 DIAGNOSIS — D696 Thrombocytopenia, unspecified: Secondary | ICD-10-CM | POA: Diagnosis not present

## 2015-01-01 DIAGNOSIS — I4891 Unspecified atrial fibrillation: Secondary | ICD-10-CM | POA: Insufficient documentation

## 2015-01-01 DIAGNOSIS — R011 Cardiac murmur, unspecified: Secondary | ICD-10-CM | POA: Diagnosis not present

## 2015-01-01 DIAGNOSIS — R350 Frequency of micturition: Secondary | ICD-10-CM | POA: Diagnosis present

## 2015-01-01 LAB — COMPREHENSIVE METABOLIC PANEL
ALT: 27 U/L (ref 14–54)
AST: 46 U/L — ABNORMAL HIGH (ref 15–41)
Albumin: 3.6 g/dL (ref 3.5–5.0)
Alkaline Phosphatase: 62 U/L (ref 38–126)
Anion gap: 7 (ref 5–15)
BILIRUBIN TOTAL: 0.8 mg/dL (ref 0.3–1.2)
BUN: 10 mg/dL (ref 6–20)
CHLORIDE: 102 mmol/L (ref 101–111)
CO2: 26 mmol/L (ref 22–32)
Calcium: 8.5 mg/dL — ABNORMAL LOW (ref 8.9–10.3)
Creatinine, Ser: 0.88 mg/dL (ref 0.44–1.00)
GFR calc Af Amer: >60 mL/min (ref >60)
GFR calc non Af Amer: 58 mL/min — ABNORMAL LOW (ref >60)
GLUCOSE: 113 mg/dL — AB (ref 65–99)
POTASSIUM: 3.8 mmol/L (ref 3.5–5.1)
SODIUM: 135 mmol/L (ref 135–145)
Total Protein: 6.2 g/dL — ABNORMAL LOW (ref 6.5–8.1)

## 2015-01-01 LAB — CBC WITH DIFFERENTIAL/PLATELET
BASOS PCT: 0 % (ref 0–1)
Basophils Absolute: 0 10*3/uL (ref 0.0–0.1)
EOS ABS: 0 10*3/uL (ref 0.0–0.7)
Eosinophils Relative: 0 % (ref 0–5)
HCT: 41.3 % (ref 36.0–46.0)
Hemoglobin: 13.7 g/dL (ref 12.0–15.0)
LYMPHS ABS: 0.5 10*3/uL — AB (ref 0.7–4.0)
LYMPHS PCT: 19 % (ref 12–46)
MCH: 32.1 pg (ref 26.0–34.0)
MCHC: 33.2 g/dL (ref 30.0–36.0)
MCV: 96.7 fL (ref 78.0–100.0)
Monocytes Absolute: 0.2 10*3/uL (ref 0.1–1.0)
Monocytes Relative: 8 % (ref 3–12)
NEUTROS ABS: 1.9 10*3/uL (ref 1.7–7.7)
Neutrophils Relative %: 73 % (ref 43–77)
PLATELETS: 73 10*3/uL — AB (ref 150–400)
RBC: 4.27 MIL/uL (ref 3.87–5.11)
RDW: 13.8 % (ref 11.5–15.5)
WBC: 2.5 10*3/uL — ABNORMAL LOW (ref 4.0–10.5)

## 2015-01-01 LAB — URINE MICROSCOPIC-ADD ON

## 2015-01-01 LAB — URINALYSIS, ROUTINE W REFLEX MICROSCOPIC
GLUCOSE, UA: NEGATIVE mg/dL
HGB URINE DIPSTICK: NEGATIVE
LEUKOCYTES UA: NEGATIVE
Nitrite: NEGATIVE
SPECIFIC GRAVITY, URINE: 1.025 (ref 1.005–1.030)
Urobilinogen, UA: 1 mg/dL (ref 0.0–1.0)
pH: 6 (ref 5.0–8.0)

## 2015-01-01 LAB — TROPONIN I

## 2015-01-01 LAB — I-STAT CG4 LACTIC ACID, ED: Lactic Acid, Venous: 2.2 mmol/L (ref 0.5–2.0)

## 2015-01-01 LAB — DIGOXIN LEVEL

## 2015-01-01 MED ORDER — SODIUM CHLORIDE 0.9 % IV BOLUS (SEPSIS)
500.0000 mL | Freq: Once | INTRAVENOUS | Status: AC
  Administered 2015-01-01: via INTRAVENOUS

## 2015-01-01 NOTE — ED Notes (Signed)
Patient complaining of frequency with urination. Patient's niece also reports that patient has been stumbling and isn't eating or drinking appropriately. Patient states she is concerned that she has a urinary tract infection.

## 2015-01-02 NOTE — ED Provider Notes (Signed)
CSN: [id number]     Arrival date & time 01/01/15  2041 History   First MD Initiated Contact with Patient 01/01/15 2306     Chief Complaint  Patient presents with  . Urinary Tract Infection     Patient is a 81 y.o. female presenting with urinary tract infection. The history is provided by the patient and a relative.  Urinary Tract Infection Pain severity:  Mild Onset quality:  Gradual Duration:  2 days Timing:  Intermittent Progression:  Improving Chronicity:  New Relieved by:  None tried Worsened by:  Nothing tried Urinary symptoms: frequent urination   Associated symptoms: no abdominal pain, no fever and no vomiting   pt reports recent increase in urinary frequency She reports feeling somewhat "dizzy" upon standing She reports similar to prior episodes of UTI Family reports she was "stumbling" and decreased PO over past 24 hours No falls No syncope No fever/vomiting She reports mild HA No focal weakness No CP/SOB today No abd pain She uses walker at home ever since previous knee surgery   Past Medical History  Diagnosis Date  . History of kidney stones   . Rosacea   . MVP (mitral valve prolapse) MILD  . Right ureteral stone   . Coronary artery disease     CARDIOLOGIST- DR Rollene Fare-- LAST VISIT 04-04-11 (will request note)---   (per note cardiolite 2009 negative,  echo jan.2010  . GERD (gastroesophageal reflux disease)     CONTROLLED W/ ACIPHEX AND prn prevacid  . Edema occasional in ankles  . Hypothyroidism   . Dysrhythmia     HX SVT AND PAF--- occ. palpitations  . LBBB (left bundle branch block) CHRONIC    GXT, 2013  . H/O hiatal hernia   . RA (rheumatoid arthritis)     followed by dr Charlestine Night  . Atrial fibrillation   . Osteoporosis 12/2011     T score -2.7  . SOB (shortness of breath) on exertion 2012    Echo, EF =50-55%, mild abnormalities  . Murmur 2010    no sig change per study  . MVP (mitral valve prolapse) 2006    no evidence in study of MVP  . H/O  Doppler ultrasound 2009    normal bilateral extremity venous duplex doppler  . A-fib 2014    Cardionet   Past Surgical History  Procedure Laterality Date  . Moles excised  2012    on nose  . Extracorporeal shock wave lithotripsy  09-24-10    and 1990  . Cataract extraction w/ intraocular lens  implant, bilateral  4 yrs ago    both eyes  . Knee arthroscopy  04/17/2011, right    Procedure: ARTHROSCOPY KNEE;  Surgeon: Dione Plover Aluisio;  Location: [place];  Service: Orthopedics;  Laterality: Right;  WITH LATERAL MENISCAL DEBRIDEMENT  . Cholecystectomy  2005  . Total knee arthroplasty  03/23/2012    Procedure: TOTAL KNEE ARTHROPLASTY;  Surgeon: Gearlean Alf, MD;  Location: [facility];  Service: Orthopedics;  Laterality: Right;  . Hip arthroplasty Right 10/12/2012    Procedure: ARTHROPLASTY BIPOLAR HIP;  Surgeon: Gearlean Alf, MD;  Location: [facility];  Service: Orthopedics;  Laterality: Right;  . Vaginal hysterectomy  1970    Leiomyomata  . Femur fx    . Sternum fx     Family History  Problem Relation Age of Onset  . Cancer Mother     COLON  . Heart disease Father     STROKE  .  Stroke Father   . Ovarian cancer Maternal Grandmother   . Crohn's disease Daughter    History  Substance Use Topics  . Smoking status: Never Smoker   . Smokeless tobacco: Never Used  . Alcohol Use: No   OB History    Gravida Para Term Preterm AB TAB SAB Ectopic Multiple Living   2 2 2       2      Review of Systems  Constitutional: Negative for fever.  Respiratory: Negative for shortness of breath.   Cardiovascular: Negative for chest pain.  Gastrointestinal: Negative for vomiting and abdominal pain.  Genitourinary: Negative for dysuria.  Neurological: Negative for syncope.  All other systems reviewed and are negative.     Allergies  Contrast media; Penicillins; and Ciprofloxacin  Home Medications   Prior to Admission medications   Medication Sig Start Date End Date  Taking? Authorizing Provider  albuterol (PROVENTIL HFA;VENTOLIN HFA) 108 (90 BASE) MCG/ACT inhaler Inhale 2 puffs into the lungs every 4 (four) hours as needed for wheezing or shortness of breath. 11/30/14  Yes [name], MD  aspirin EC 81 MG tablet Take 81 mg by mouth every morning.   Yes Historical Provider, MD  clorazepate (TRANXENE) 7.5 MG tablet Take 7.5 mg by mouth at bedtime.    Yes Historical Provider, MD  digoxin (DIGOX) 0.125 MG tablet Take 0.5 tablets (0.0625 mg total) by mouth daily. <PLEASE MAKE APPOINTMENT FOR REFILLS> 11/30/14  Yes Troy Sine, MD  levothyroxine (SYNTHROID, LEVOTHROID) 75 MCG tablet Take 1 tablet by mouth every day. Please contact primacy doctor for future refills. 03/05/13  Yes Lorretta Harp, MD  Multiple Vitamin (MULTIVITAMIN WITH MINERALS) TABS tablet Take 1 tablet by mouth daily.   Yes Historical Provider, MD  pantoprazole (PROTONIX) 40 MG tablet Take 40 mg by mouth daily.    Yes Historical Provider, MD  ranitidine (ZANTAC) 150 MG tablet Take 150 mg by mouth at bedtime.  12/31/14  Yes Historical Provider, MD  sodium chloride (MURO 128) 5 % ophthalmic ointment Place 1 application into both eyes at bedtime.   Yes Historical Provider, MD  sodium chloride (MURO 128) 5 % ophthalmic solution Place 1 drop into both eyes 3 (three) times daily.   Yes Historical Provider, MD  vitamin E 400 UNIT capsule Take 400 Units by mouth daily.   Yes Historical Provider, MD  levofloxacin (LEVAQUIN) 750 MG tablet Take 1 tablet (750 mg total) by mouth daily. Patient not taking: Reported on 01/01/2015 11/30/14   [name], MD   BP 101/49 mmHg  Pulse 93  Temp(Src) 98.7 F (37.1 C) (Oral)  Resp 16  Ht 5\' 5"  (1.651 m)  Wt 128 lb (58.06 kg)  BMI 21.30 kg/m2  SpO2 100% Physical Exam CONSTITUTIONAL: Well developed/well nourished HEAD: Normocephalic/atraumatic EYES: EOMI/PERRL, no nystagmus, no ptosis ENMT: Mucous membranes moist NECK: supple no meningeal signs,  no bruits SPINE/BACK:entire spine nontender CV: S1/S2 noted, no murmurs/rubs/gallops noted LUNGS: Lungs are clear to auscultation bilaterally, no apparent distress ABDOMEN: soft, nontender, no rebound or guarding GU:no cva tenderness NEURO:Awake/alert, facies symmetric, no arm or leg drift is noted Equal 5/5 strength with shoulder abduction, elbow flex/extension, wrist flex/extension in upper extremities and equal hand grips bilaterally Equal 5/5 strength with hip flexion,knee flex/extension, foot dorsi/plantar flexion Cranial nerves 3/4/5/6/12/02/08/11/12 tested and intact Gait normal without ataxia No past pointing Sensation to light touch intact in all extremities EXTREMITIES: pulses normal, full ROM SKIN: warm, color normal PSYCH: no abnormalities of  mood noted, alert and oriented to situation    ED Course  Procedures  12:37 AM Pt well appearing No focal weakness She can ambulate unassisted She reports most of her symptoms improved No signs of stroke Initial urine not c/w UTI Urine culture pending Informed of abnormal CBC and need for PCP workup Otherwise I feel she is safe for d/c home Discussed with patient/niece They are agreeable with plan We discussed strict return precautions  Labs Review Labs Reviewed  URINALYSIS, ROUTINE W REFLEX MICROSCOPIC (NOT AT [facility]) - Abnormal; Notable for the following:    APPearance HAZY (*)    Bilirubin Urine SMALL (*)    Ketones, ur TRACE (*)    Protein, ur TRACE (*)    All other components within normal limits  COMPREHENSIVE METABOLIC PANEL - Abnormal; Notable for the following:    Glucose, Bld 113 (*)    Calcium 8.5 (*)    Total Protein 6.2 (*)    AST 46 (*)    GFR calc non Af Amer 58 (*)    All other components within normal limits  CBC WITH DIFFERENTIAL/PLATELET - Abnormal; Notable for the following:    WBC 2.5 (*)    Platelets 73 (*)    Lymphs Abs 0.5 (*)    All other components within normal limits  URINE MICROSCOPIC-ADD  ON - Abnormal; Notable for the following:    Bacteria, UA FEW (*)    Crystals CA OXALATE CRYSTALS (*)    All other components within normal limits  DIGOXIN LEVEL - Abnormal; Notable for the following:    Digoxin Level <0.8 (*)    All other components within normal limits  I-STAT CG4 LACTIC ACID, ED - Abnormal; Notable for the following:    Lactic Acid, Venous 2.20 (*)    All other components within normal limits  URINE CULTURE  TROPONIN I     EKG Interpretation   Date/Time:  Sunday January 01 2015 21:03:04 EDT Ventricular Rate:  96 PR Interval:  166 QRS Duration: 130 QT Interval:  368 QTC Calculation: 464 R Axis:   4 Text Interpretation:  Normal sinus rhythm Left bundle branch block  Abnormal ECG No significant change since last tracing Confirmed by  Christy Gentles  MD, Elenore Rota ([id number]) on 01/01/2015 11:08:05 PM      MDM   Final diagnoses:  Other fatigue  Leukopenia  Thrombocytopenia    Nursing notes including past medical history and social history reviewed and considered in documentation Labs/vital reviewed myself and considered during evaluation     Ripley Fraise, MD 01/02/15 [phone]

## 2015-01-02 NOTE — Discharge Instructions (Signed)

## 2015-01-03 LAB — URINE CULTURE

## 2015-01-04 ENCOUNTER — Telehealth (HOSPITAL_COMMUNITY): Payer: Self-pay

## 2015-01-04 ENCOUNTER — Ambulatory Visit: Payer: Medicare Other | Admitting: Cardiology

## 2015-01-04 NOTE — Telephone Encounter (Signed)
Post ED Visit - Positive Culture Follow-up  Culture report reviewed by antimicrobial stewardship pharmacist: []  Wes Dulaney, Pharm.D., BCPS []  Heide Guile, Pharm.D., BCPS []  Alycia Rossetti, Pharm.D., BCPS []  [place], Pharm.D., BCPS, AAHIVP []  Legrand Como, Pharm.D., BCPS, AAHIVP []  Isac Sarna, Pharm.D., BCPS X  Stone,T Pharm D  Positive Urine culture, Multiple Species present, suggest recollection No abx tx.  OK   Dortha Kern 01/04/2015, 11:32 AM

## 2015-01-10 ENCOUNTER — Ambulatory Visit (INDEPENDENT_AMBULATORY_CARE_PROVIDER_SITE_OTHER): Payer: Medicare Other | Admitting: Cardiovascular Disease

## 2015-01-10 VITALS — BP 124/92 | HR 66 | Ht 63.0 in | Wt 130.0 lb

## 2015-01-10 DIAGNOSIS — R531 Weakness: Secondary | ICD-10-CM

## 2015-01-10 DIAGNOSIS — I447 Left bundle-branch block, unspecified: Secondary | ICD-10-CM

## 2015-01-10 DIAGNOSIS — R0789 Other chest pain: Secondary | ICD-10-CM

## 2015-01-10 DIAGNOSIS — E038 Other specified hypothyroidism: Secondary | ICD-10-CM

## 2015-01-10 MED ORDER — LEVOTHYROXINE SODIUM 50 MCG PO TABS: 50.0000 ug | ORAL_TABLET | Freq: Every day | ORAL | Status: DC

## 2015-01-10 NOTE — Patient Instructions (Addendum)
Your physician recommends that you return for lab work in: 4 weeks.  Your physician has requested that you have an echocardiogram. Echocardiography is a painless test that uses sound waves to create images of your heart. It provides your doctor with information about the size and shape of your heart and how well your heart's chambers and valves are working. This procedure takes approximately one hour. There are no restrictions for this procedure.  Your physician has recommended you make the following change in your medication: the levothyroxine has been decreased to 50 mcg. A new prescription has been sent to your pharmacy.STOP the digoxin. (lanoxin)  Your physician recommends that you schedule a follow-up appointment in: 2-3 months with Dr. Claiborne Billings.

## 2015-01-11 ENCOUNTER — Encounter: Payer: Self-pay | Admitting: Cardiovascular Disease
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
## 2015-01-12 ENCOUNTER — Other Ambulatory Visit: Payer: Self-pay | Admitting: Cardiovascular Disease

## 2015-01-13 ENCOUNTER — Encounter: Payer: Self-pay | Admitting: Cardiovascular Disease

## 2015-01-13 LAB — TSH: TSH: 2.9 u[IU]/mL (ref 0.450–4.500)

## 2015-01-17 ENCOUNTER — Telehealth: Payer: Self-pay | Admitting: *Deleted

## 2015-01-17 ENCOUNTER — Ambulatory Visit (HOSPITAL_COMMUNITY): Payer: Medicare Other | Attending: Cardiovascular Disease

## 2015-01-17 ENCOUNTER — Other Ambulatory Visit (HOSPITAL_COMMUNITY): Payer: Medicare Other

## 2015-01-17 ENCOUNTER — Other Ambulatory Visit: Payer: Self-pay

## 2015-01-17 DIAGNOSIS — Z87891 Personal history of nicotine dependence: Secondary | ICD-10-CM | POA: Insufficient documentation

## 2015-01-17 DIAGNOSIS — R0789 Other chest pain: Secondary | ICD-10-CM

## 2015-01-17 DIAGNOSIS — I517 Cardiomegaly: Secondary | ICD-10-CM | POA: Insufficient documentation

## 2015-01-17 DIAGNOSIS — I313 Pericardial effusion (noninflammatory): Secondary | ICD-10-CM | POA: Insufficient documentation

## 2015-01-17 DIAGNOSIS — I351 Nonrheumatic aortic (valve) insufficiency: Secondary | ICD-10-CM | POA: Insufficient documentation

## 2015-01-17 NOTE — Telephone Encounter (Signed)
-----   Message from Troy Sine, MD sent at 01/15/2015  7:01 PM EDT ----- tsh nl

## 2015-01-17 NOTE — Telephone Encounter (Signed)
Informed patient of lab results. Voice understanding.

## 2015-01-20 ENCOUNTER — Encounter: Payer: Self-pay | Admitting: Cardiovascular Disease

## 2015-01-25 ENCOUNTER — Encounter: Payer: Self-pay | Admitting: *Deleted

## 2015-03-23 ENCOUNTER — Ambulatory Visit (INDEPENDENT_AMBULATORY_CARE_PROVIDER_SITE_OTHER): Payer: Medicare Other | Admitting: Otolaryngology

## 2015-03-23 DIAGNOSIS — K219 Gastro-esophageal reflux disease without esophagitis: Secondary | ICD-10-CM

## 2015-03-23 DIAGNOSIS — R490 Dysphonia: Secondary | ICD-10-CM

## 2015-03-31 ENCOUNTER — Ambulatory Visit: Payer: Medicare Other | Admitting: Cardiovascular Disease

## 2015-04-03 ENCOUNTER — Ambulatory Visit (INDEPENDENT_AMBULATORY_CARE_PROVIDER_SITE_OTHER): Payer: Medicare Other | Admitting: Cardiovascular Disease

## 2015-04-03 VITALS — BP 112/72 | HR 64 | Ht 65.0 in | Wt 127.6 lb

## 2015-04-03 DIAGNOSIS — E039 Hypothyroidism, unspecified: Secondary | ICD-10-CM

## 2015-04-03 DIAGNOSIS — I25810 Atherosclerosis of coronary artery bypass graft(s) without angina pectoris: Secondary | ICD-10-CM

## 2015-04-03 DIAGNOSIS — R0789 Other chest pain: Secondary | ICD-10-CM | POA: Diagnosis not present

## 2015-04-03 DIAGNOSIS — I447 Left bundle-branch block, unspecified: Secondary | ICD-10-CM

## 2015-04-03 DIAGNOSIS — R931 Abnormal findings on diagnostic imaging of heart and coronary circulation: Secondary | ICD-10-CM

## 2015-04-03 NOTE — Patient Instructions (Signed)
Your physician recommends that you schedule a follow-up appointment and echo in 6 months or sooner if needed.

## 2015-04-05 ENCOUNTER — Encounter: Payer: Self-pay | Admitting: Cardiovascular Disease

## 2015-04-05 ENCOUNTER — Other Ambulatory Visit: Payer: Self-pay | Admitting: Cardiovascular Disease
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
## 2015-04-27 ENCOUNTER — Telehealth: Payer: Self-pay | Admitting: Gynecology

## 2015-04-27 NOTE — Telephone Encounter (Signed)
Prolia due after 05/04/15. Cost to pt $0,  [employer] and secondary covers Prolia. Calcium 8.5 01/01/2015. Appointment made for 05/10/15 at 3 pm Prolia injection Nurse only. Will confirm with Dr Phineas Real calcium level less than 8.9.

## 2015-05-01 NOTE — Telephone Encounter (Signed)
Prolia okay

## 2015-05-02 ENCOUNTER — Encounter: Payer: Self-pay | Admitting: Gynecology

## 2015-05-03 ENCOUNTER — Other Ambulatory Visit: Payer: Self-pay | Admitting: Cardiovascular Disease

## 2015-05-03 NOTE — Telephone Encounter (Signed)
REFILL 

## 2015-05-10 ENCOUNTER — Ambulatory Visit (INDEPENDENT_AMBULATORY_CARE_PROVIDER_SITE_OTHER): Payer: Medicare Other | Admitting: Gynecology

## 2015-05-10 DIAGNOSIS — M810 Age-related osteoporosis without current pathological fracture: Secondary | ICD-10-CM

## 2015-05-10 MED ORDER — DENOSUMAB 60 MG/ML ~~LOC~~ SOLN
60.0000 mg | Freq: Once | SUBCUTANEOUS | Status: AC
  Administered 2015-05-10: 60 mg via SUBCUTANEOUS

## 2015-05-17 NOTE — Telephone Encounter (Signed)
Prolia injection on 05/10/15. Next injection due after 11/09/15

## 2015-06-01 ENCOUNTER — Ambulatory Visit (INDEPENDENT_AMBULATORY_CARE_PROVIDER_SITE_OTHER): Payer: Medicare Other | Admitting: Otolaryngology

## 2015-06-01 DIAGNOSIS — R490 Dysphonia: Secondary | ICD-10-CM | POA: Diagnosis not present

## 2015-06-06 ENCOUNTER — Encounter: Payer: Medicare Other | Admitting: Gynecology

## 2015-07-07 DIAGNOSIS — M25512 Pain in left shoulder: Secondary | ICD-10-CM | POA: Diagnosis not present

## 2015-07-24 ENCOUNTER — Encounter: Payer: Self-pay | Admitting: Gynecology

## 2015-07-24 ENCOUNTER — Ambulatory Visit (INDEPENDENT_AMBULATORY_CARE_PROVIDER_SITE_OTHER): Payer: Medicare Other | Admitting: Gynecology

## 2015-07-24 VITALS — BP 122/76 | Ht 65.0 in | Wt 128.0 lb

## 2015-07-24 DIAGNOSIS — M810 Age-related osteoporosis without current pathological fracture: Secondary | ICD-10-CM | POA: Diagnosis not present

## 2015-07-24 DIAGNOSIS — Z01419 Encounter for gynecological examination (general) (routine) without abnormal findings: Secondary | ICD-10-CM

## 2015-07-24 DIAGNOSIS — N952 Postmenopausal atrophic vaginitis: Secondary | ICD-10-CM | POA: Diagnosis not present

## 2015-07-24 NOTE — Patient Instructions (Signed)
Continue with Prolia when due in June.

## 2015-07-24 NOTE — Progress Notes (Signed)
Debra Richards October 29, 1925 [id number]        82 y.o.  G2P2002  For breast and pelvic exam.  Several issues noted below.  Past medical history,surgical history, problem list, medications, allergies, family history and social history were all reviewed and documented as reviewed in the EPIC chart.  ROS:  Performed with pertinent positives and negatives included in the history, assessment and plan.   Additional significant findings :  none   Exam: [name] assistant Filed Vitals:   07/24/15 1417  BP: 122/76  Height: 5\' 5"  (1.651 m)  Weight: 128 lb (58.06 kg)   General appearance:  Normal affect, orientation and appearance. Skin: Grossly normal HEENT: Without gross lesions.  No cervical or supraclavicular adenopathy. Thyroid normal.  Lungs:  Clear without wheezing, rales or rhonchi Cardiac: RR, without RMG Abdominal:  Soft, nontender, without masses, guarding, rebound, organomegaly or hernia Breasts:  Examined lying and sitting without masses, retractions, discharge or axillary adenopathy. Pelvic:  Ext/BUS/vagina with atrophic changes  Adnexa without masses or tenderness    Anus and perineum normal   Rectovaginal normal sphincter tone without palpated masses or tenderness.    Assessment/Plan:  82 y.o. [id number] female for breast and pelvic exam.   1. Postmenopausal/atrophic genital changes. Status post [facility] for leiomyoma. Doing well without significant hot flushes, night sweats or vaginal dryness. 2. Osteoporosis. DEXA 2013 T score -2.7. Relates having had one at her primary physician's office this past year. Will obtain a copy of this. She is starting her second year of Prolia.  Will obtain copy of the bone density. Continue on Prolia for now. 3. Mammography July 2016. Continue with annual mammography when due. SBE monthly reviewed. 4. Colonoscopy 2011. Repeat at their recommended interval. 5. Pap smear 2011. No Pap smear done today. We both agree to stop screening based on  age and history of hysterectomy her current screening guidelines. 6. Health maintenance. No routine lab work done as this is done at her primary physician's office. Follow up in June when due for her Prolia otherwise 1 year for annual exam.   [name] MD, 2:51 PM 07/24/2015

## 2015-08-16 DIAGNOSIS — H01021 Squamous blepharitis right upper eyelid: Secondary | ICD-10-CM | POA: Diagnosis not present

## 2015-08-16 DIAGNOSIS — H01022 Squamous blepharitis right lower eyelid: Secondary | ICD-10-CM | POA: Diagnosis not present

## 2015-08-16 DIAGNOSIS — H01025 Squamous blepharitis left lower eyelid: Secondary | ICD-10-CM | POA: Diagnosis not present

## 2015-08-16 DIAGNOSIS — H01024 Squamous blepharitis left upper eyelid: Secondary | ICD-10-CM | POA: Diagnosis not present

## 2015-08-16 DIAGNOSIS — Z961 Presence of intraocular lens: Secondary | ICD-10-CM | POA: Diagnosis not present

## 2015-08-16 DIAGNOSIS — H0014 Chalazion left upper eyelid: Secondary | ICD-10-CM | POA: Diagnosis not present

## 2015-08-16 DIAGNOSIS — H1859 Other hereditary corneal dystrophies: Secondary | ICD-10-CM | POA: Diagnosis not present

## 2015-09-20 DIAGNOSIS — L570 Actinic keratosis: Secondary | ICD-10-CM | POA: Diagnosis not present

## 2015-09-20 DIAGNOSIS — Z85828 Personal history of other malignant neoplasm of skin: Secondary | ICD-10-CM | POA: Diagnosis not present

## 2015-09-25 DIAGNOSIS — R350 Frequency of micturition: Secondary | ICD-10-CM | POA: Diagnosis not present

## 2015-09-25 DIAGNOSIS — R300 Dysuria: Secondary | ICD-10-CM | POA: Diagnosis not present

## 2015-10-03 ENCOUNTER — Telehealth: Payer: Self-pay | Admitting: Cardiovascular Disease

## 2015-10-03 NOTE — Telephone Encounter (Signed)
10-03-15 Lvm to call and schedule Echo - to be done in the [place] office since she live there. Debra Richards

## 2015-10-17 DIAGNOSIS — Z6820 Body mass index (BMI) 20.0-20.9, adult: Secondary | ICD-10-CM | POA: Diagnosis not present

## 2015-10-17 DIAGNOSIS — J069 Acute upper respiratory infection, unspecified: Secondary | ICD-10-CM | POA: Diagnosis not present

## 2015-10-17 DIAGNOSIS — Z713 Dietary counseling and surveillance: Secondary | ICD-10-CM | POA: Diagnosis not present

## 2015-10-17 DIAGNOSIS — Z789 Other specified health status: Secondary | ICD-10-CM | POA: Diagnosis not present

## 2015-10-31 ENCOUNTER — Telehealth: Payer: Self-pay | Admitting: Gynecology

## 2015-10-31 DIAGNOSIS — Z0000 Encounter for general adult medical examination without abnormal findings: Secondary | ICD-10-CM | POA: Diagnosis not present

## 2015-10-31 DIAGNOSIS — E039 Hypothyroidism, unspecified: Secondary | ICD-10-CM | POA: Diagnosis not present

## 2015-10-31 DIAGNOSIS — Z418 Encounter for other procedures for purposes other than remedying health state: Secondary | ICD-10-CM | POA: Diagnosis not present

## 2015-10-31 DIAGNOSIS — Z6820 Body mass index (BMI) 20.0-20.9, adult: Secondary | ICD-10-CM | POA: Diagnosis not present

## 2015-10-31 DIAGNOSIS — E7800 Pure hypercholesterolemia, unspecified: Secondary | ICD-10-CM | POA: Diagnosis not present

## 2015-10-31 DIAGNOSIS — Z1389 Encounter for screening for other disorder: Secondary | ICD-10-CM | POA: Diagnosis not present

## 2015-10-31 DIAGNOSIS — Z7189 Other specified counseling: Secondary | ICD-10-CM | POA: Diagnosis not present

## 2015-10-31 DIAGNOSIS — Z79899 Other long term (current) drug therapy: Secondary | ICD-10-CM | POA: Diagnosis not present

## 2015-10-31 DIAGNOSIS — Z1211 Encounter for screening for malignant neoplasm of colon: Secondary | ICD-10-CM | POA: Diagnosis not present

## 2015-10-31 DIAGNOSIS — R5383 Other fatigue: Secondary | ICD-10-CM | POA: Diagnosis not present

## 2015-10-31 NOTE — Telephone Encounter (Signed)
Prolia due after 11/09/2015  Checking benefits. Calcium 8.5 01/01/2015 at PCP. Check on recent bone density and calcium at PCP

## 2015-11-07 ENCOUNTER — Telehealth: Payer: Self-pay | Admitting: Gynecology

## 2015-11-07 NOTE — Telephone Encounter (Signed)
prolia due after 11/09/15 .  Calcium 8.5 on 01/01/15  Will check with Dr Phineas Real regarding Calcium. Insurance benefits Deductible [phone] (met). Secondary insurance coverage $0 cost for pt. Appointment for 11/14/15 Prolia injection only.

## 2015-11-08 NOTE — Telephone Encounter (Signed)
I would recommend patient start on calcium supplement of 1000 mg daily and to stay on this and recheck a calcium level in one month and then if it's in normal range she can get her Prolia and stay on the extra calcium. If it is still low then we will address that.

## 2015-11-09 NOTE — Telephone Encounter (Signed)
PCP did repeat blood work in June 7 ,  2017  . Calcium normal 9.0 report faxed to office from PCP. Appointment for Prolia on 11/14/2015  . I will check with Dr Phineas Real about her injection.

## 2015-11-10 NOTE — Telephone Encounter (Signed)
Ok for injection  

## 2015-11-13 ENCOUNTER — Ambulatory Visit (INDEPENDENT_AMBULATORY_CARE_PROVIDER_SITE_OTHER): Payer: Medicare Other | Admitting: Otolaryngology

## 2015-11-13 ENCOUNTER — Encounter: Payer: Self-pay | Admitting: Gynecology

## 2015-11-13 DIAGNOSIS — K219 Gastro-esophageal reflux disease without esophagitis: Secondary | ICD-10-CM

## 2015-11-13 DIAGNOSIS — R490 Dysphonia: Secondary | ICD-10-CM

## 2015-11-13 NOTE — Telephone Encounter (Signed)
PC to pt OK per Dr Phineas Real for [employer]

## 2015-11-14 ENCOUNTER — Ambulatory Visit (INDEPENDENT_AMBULATORY_CARE_PROVIDER_SITE_OTHER): Payer: Medicare Other | Admitting: Gynecology

## 2015-11-14 DIAGNOSIS — M810 Age-related osteoporosis without current pathological fracture: Secondary | ICD-10-CM

## 2015-11-14 MED ORDER — DENOSUMAB 60 MG/ML ~~LOC~~ SOLN
60.0000 mg | Freq: Once | SUBCUTANEOUS | Status: AC
  Administered 2015-11-14: 60 mg via SUBCUTANEOUS

## 2015-11-16 NOTE — Telephone Encounter (Signed)
Prolia given 11/14/15  Next injection after 05/16/16

## 2015-12-01 DIAGNOSIS — Z96641 Presence of right artificial hip joint: Secondary | ICD-10-CM | POA: Diagnosis not present

## 2015-12-01 DIAGNOSIS — Z471 Aftercare following joint replacement surgery: Secondary | ICD-10-CM | POA: Diagnosis not present

## 2015-12-13 ENCOUNTER — Telehealth (HOSPITAL_COMMUNITY): Payer: Self-pay | Admitting: Cardiovascular Disease

## 2015-12-13 NOTE — Telephone Encounter (Signed)
Called pt and lmsg giving her an appt date and time of 8/9 at 2:30 in the [place] office to have her Echo done. I asked her to cb to confirm appt.Marland KitchenRG

## 2015-12-29 DIAGNOSIS — M545 Low back pain: Secondary | ICD-10-CM | POA: Diagnosis not present

## 2015-12-29 DIAGNOSIS — M5136 Other intervertebral disc degeneration, lumbar region: Secondary | ICD-10-CM | POA: Diagnosis not present

## 2016-01-01 DIAGNOSIS — M545 Low back pain: Secondary | ICD-10-CM | POA: Diagnosis not present

## 2016-01-01 DIAGNOSIS — M5136 Other intervertebral disc degeneration, lumbar region: Secondary | ICD-10-CM | POA: Diagnosis not present

## 2016-01-03 ENCOUNTER — Other Ambulatory Visit: Payer: Self-pay

## 2016-01-03 ENCOUNTER — Ambulatory Visit (INDEPENDENT_AMBULATORY_CARE_PROVIDER_SITE_OTHER): Payer: Medicare Other

## 2016-01-03 DIAGNOSIS — I2510 Atherosclerotic heart disease of native coronary artery without angina pectoris: Secondary | ICD-10-CM | POA: Diagnosis not present

## 2016-01-03 DIAGNOSIS — R931 Abnormal findings on diagnostic imaging of heart and coronary circulation: Secondary | ICD-10-CM | POA: Diagnosis not present

## 2016-01-08 DIAGNOSIS — M5136 Other intervertebral disc degeneration, lumbar region: Secondary | ICD-10-CM | POA: Diagnosis not present

## 2016-01-08 DIAGNOSIS — M545 Low back pain: Secondary | ICD-10-CM | POA: Diagnosis not present

## 2016-01-11 DIAGNOSIS — M5136 Other intervertebral disc degeneration, lumbar region: Secondary | ICD-10-CM | POA: Diagnosis not present

## 2016-01-11 DIAGNOSIS — M545 Low back pain: Secondary | ICD-10-CM | POA: Diagnosis not present

## 2016-01-15 ENCOUNTER — Ambulatory Visit (INDEPENDENT_AMBULATORY_CARE_PROVIDER_SITE_OTHER): Payer: Medicare Other | Admitting: Otolaryngology

## 2016-01-22 ENCOUNTER — Ambulatory Visit (INDEPENDENT_AMBULATORY_CARE_PROVIDER_SITE_OTHER): Payer: Medicare Other | Admitting: Otolaryngology

## 2016-01-22 DIAGNOSIS — K122 Cellulitis and abscess of mouth: Secondary | ICD-10-CM

## 2016-01-22 DIAGNOSIS — K219 Gastro-esophageal reflux disease without esophagitis: Secondary | ICD-10-CM | POA: Diagnosis not present

## 2016-01-22 DIAGNOSIS — R490 Dysphonia: Secondary | ICD-10-CM

## 2016-01-22 DIAGNOSIS — G4700 Insomnia, unspecified: Secondary | ICD-10-CM | POA: Diagnosis not present

## 2016-01-22 DIAGNOSIS — R350 Frequency of micturition: Secondary | ICD-10-CM | POA: Diagnosis not present

## 2016-01-22 DIAGNOSIS — M1990 Unspecified osteoarthritis, unspecified site: Secondary | ICD-10-CM | POA: Diagnosis not present

## 2016-01-22 DIAGNOSIS — N10 Acute tubulo-interstitial nephritis: Secondary | ICD-10-CM | POA: Diagnosis not present

## 2016-01-31 DIAGNOSIS — M792 Neuralgia and neuritis, unspecified: Secondary | ICD-10-CM | POA: Diagnosis not present

## 2016-01-31 DIAGNOSIS — Z299 Encounter for prophylactic measures, unspecified: Secondary | ICD-10-CM | POA: Diagnosis not present

## 2016-01-31 DIAGNOSIS — M545 Low back pain: Secondary | ICD-10-CM | POA: Diagnosis not present

## 2016-02-14 DIAGNOSIS — K115 Sialolithiasis: Secondary | ICD-10-CM | POA: Diagnosis not present

## 2016-02-14 DIAGNOSIS — H01025 Squamous blepharitis left lower eyelid: Secondary | ICD-10-CM | POA: Diagnosis not present

## 2016-02-14 DIAGNOSIS — H01022 Squamous blepharitis right lower eyelid: Secondary | ICD-10-CM | POA: Diagnosis not present

## 2016-02-14 DIAGNOSIS — H1859 Other hereditary corneal dystrophies: Secondary | ICD-10-CM | POA: Diagnosis not present

## 2016-02-14 DIAGNOSIS — K1121 Acute sialoadenitis: Secondary | ICD-10-CM | POA: Diagnosis not present

## 2016-02-14 DIAGNOSIS — H0014 Chalazion left upper eyelid: Secondary | ICD-10-CM | POA: Diagnosis not present

## 2016-02-14 DIAGNOSIS — H01024 Squamous blepharitis left upper eyelid: Secondary | ICD-10-CM | POA: Diagnosis not present

## 2016-02-14 DIAGNOSIS — H01021 Squamous blepharitis right upper eyelid: Secondary | ICD-10-CM | POA: Diagnosis not present

## 2016-02-14 DIAGNOSIS — Z961 Presence of intraocular lens: Secondary | ICD-10-CM | POA: Diagnosis not present

## 2016-02-15 ENCOUNTER — Inpatient Hospital Stay (HOSPITAL_COMMUNITY)
Admission: EM | Admit: 2016-02-15 | Discharge: 2016-02-20 | DRG: 156 | Disposition: A | Discharge status: Home / Self Care | Payer: Medicare Other | Attending: Internal Medicine | Admitting: Internal Medicine

## 2016-02-15 ENCOUNTER — Observation Stay (HOSPITAL_COMMUNITY): Payer: Medicare Other

## 2016-02-15 ENCOUNTER — Encounter (HOSPITAL_COMMUNITY): Payer: Self-pay | Admitting: Cardiology

## 2016-02-15 ENCOUNTER — Emergency Department (HOSPITAL_COMMUNITY): Payer: Medicare Other

## 2016-02-15 DIAGNOSIS — I480 Paroxysmal atrial fibrillation: Secondary | ICD-10-CM | POA: Diagnosis present

## 2016-02-15 DIAGNOSIS — K219 Gastro-esophageal reflux disease without esophagitis: Secondary | ICD-10-CM | POA: Diagnosis present

## 2016-02-15 DIAGNOSIS — Z87442 Personal history of urinary calculi: Secondary | ICD-10-CM

## 2016-02-15 DIAGNOSIS — K1120 Sialoadenitis, unspecified: Secondary | ICD-10-CM | POA: Diagnosis not present

## 2016-02-15 DIAGNOSIS — K1121 Acute sialoadenitis: Secondary | ICD-10-CM | POA: Diagnosis not present

## 2016-02-15 DIAGNOSIS — R410 Disorientation, unspecified: Secondary | ICD-10-CM | POA: Diagnosis present

## 2016-02-15 DIAGNOSIS — Z96641 Presence of right artificial hip joint: Secondary | ICD-10-CM | POA: Diagnosis present

## 2016-02-15 DIAGNOSIS — Z9049 Acquired absence of other specified parts of digestive tract: Secondary | ICD-10-CM

## 2016-02-15 DIAGNOSIS — R5381 Other malaise: Secondary | ICD-10-CM | POA: Diagnosis not present

## 2016-02-15 DIAGNOSIS — E876 Hypokalemia: Secondary | ICD-10-CM

## 2016-02-15 DIAGNOSIS — Z8249 Family history of ischemic heart disease and other diseases of the circulatory system: Secondary | ICD-10-CM

## 2016-02-15 DIAGNOSIS — R5383 Other fatigue: Secondary | ICD-10-CM

## 2016-02-15 DIAGNOSIS — Z96651 Presence of right artificial knee joint: Secondary | ICD-10-CM | POA: Diagnosis present

## 2016-02-15 DIAGNOSIS — D649 Anemia, unspecified: Secondary | ICD-10-CM | POA: Diagnosis present

## 2016-02-15 DIAGNOSIS — I2510 Atherosclerotic heart disease of native coronary artery without angina pectoris: Secondary | ICD-10-CM | POA: Diagnosis present

## 2016-02-15 DIAGNOSIS — R51 Headache: Secondary | ICD-10-CM | POA: Diagnosis not present

## 2016-02-15 DIAGNOSIS — M6281 Muscle weakness (generalized): Secondary | ICD-10-CM

## 2016-02-15 DIAGNOSIS — I10 Essential (primary) hypertension: Secondary | ICD-10-CM | POA: Diagnosis present

## 2016-02-15 DIAGNOSIS — R531 Weakness: Secondary | ICD-10-CM | POA: Diagnosis not present

## 2016-02-15 DIAGNOSIS — Z823 Family history of stroke: Secondary | ICD-10-CM

## 2016-02-15 DIAGNOSIS — Z87440 Personal history of urinary (tract) infections: Secondary | ICD-10-CM

## 2016-02-15 DIAGNOSIS — N200 Calculus of kidney: Secondary | ICD-10-CM | POA: Diagnosis not present

## 2016-02-15 DIAGNOSIS — M810 Age-related osteoporosis without current pathological fracture: Secondary | ICD-10-CM | POA: Diagnosis present

## 2016-02-15 DIAGNOSIS — E039 Hypothyroidism, unspecified: Secondary | ICD-10-CM | POA: Diagnosis present

## 2016-02-15 DIAGNOSIS — D696 Thrombocytopenia, unspecified: Secondary | ICD-10-CM | POA: Diagnosis present

## 2016-02-15 DIAGNOSIS — M069 Rheumatoid arthritis, unspecified: Secondary | ICD-10-CM | POA: Diagnosis not present

## 2016-02-15 DIAGNOSIS — I482 Chronic atrial fibrillation: Secondary | ICD-10-CM

## 2016-02-15 DIAGNOSIS — Z7982 Long term (current) use of aspirin: Secondary | ICD-10-CM

## 2016-02-15 DIAGNOSIS — I4891 Unspecified atrial fibrillation: Secondary | ICD-10-CM | POA: Diagnosis present

## 2016-02-15 DIAGNOSIS — R1310 Dysphagia, unspecified: Secondary | ICD-10-CM | POA: Diagnosis not present

## 2016-02-15 DIAGNOSIS — Z8041 Family history of malignant neoplasm of ovary: Secondary | ICD-10-CM

## 2016-02-15 LAB — URINE MICROSCOPIC-ADD ON: RBC / HPF: NONE SEEN RBC/hpf (ref 0–5)

## 2016-02-15 LAB — CBC
HEMATOCRIT: 41 % (ref 36.0–46.0)
HEMOGLOBIN: 13.7 g/dL (ref 12.0–15.0)
MCH: 32.8 pg (ref 26.0–34.0)
MCHC: 33.4 g/dL (ref 30.0–36.0)
MCV: 98.1 fL (ref 78.0–100.0)
Platelets: 98 10*3/uL — ABNORMAL LOW (ref 150–400)
RBC: 4.18 MIL/uL (ref 3.87–5.11)
RDW: 13.4 % (ref 11.5–15.5)
WBC: 10.5 10*3/uL (ref 4.0–10.5)

## 2016-02-15 LAB — URINALYSIS, ROUTINE W REFLEX MICROSCOPIC
Bilirubin Urine: NEGATIVE
Glucose, UA: NEGATIVE mg/dL
Hgb urine dipstick: NEGATIVE
Ketones, ur: NEGATIVE mg/dL
Nitrite: NEGATIVE
PH: 5.5 (ref 5.0–8.0)
Protein, ur: NEGATIVE mg/dL

## 2016-02-15 LAB — BASIC METABOLIC PANEL
ANION GAP: 9 (ref 5–15)
BUN: 15 mg/dL (ref 6–20)
CALCIUM: 8.5 mg/dL — AB (ref 8.9–10.3)
CO2: 23 mmol/L (ref 22–32)
Chloride: 103 mmol/L (ref 101–111)
Creatinine, Ser: 0.91 mg/dL (ref 0.44–1.00)
GFR calc non Af Amer: 55 mL/min — ABNORMAL LOW (ref >60)
GLUCOSE: 134 mg/dL — AB (ref 65–99)
POTASSIUM: 3.2 mmol/L — AB (ref 3.5–5.1)
Sodium: 135 mmol/L (ref 135–145)

## 2016-02-15 LAB — CK: CK TOTAL: 456 U/L — AB (ref 38–234)

## 2016-02-15 MED ORDER — ONDANSETRON HCL 4 MG/2ML IJ SOLN: 4.0000 mg | Freq: Four times a day (QID) | INTRAMUSCULAR | Status: DC | PRN

## 2016-02-15 MED ORDER — DEXTROSE 5 % IV SOLN
INTRAVENOUS | Status: AC
  Filled 2016-02-15: qty 1

## 2016-02-15 MED ORDER — FAMOTIDINE 20 MG PO TABS
20.0000 mg | ORAL_TABLET | Freq: Every day | ORAL | Status: DC
  Administered 2016-02-15 – 2016-02-19 (×5): 20 mg via ORAL
  Filled 2016-02-15 (×5): qty 1

## 2016-02-15 MED ORDER — ENOXAPARIN SODIUM 40 MG/0.4ML ~~LOC~~ SOLN
40.0000 mg | SUBCUTANEOUS | Status: DC
  Administered 2016-02-15: 40 mg via SUBCUTANEOUS
  Filled 2016-02-15: qty 0.4

## 2016-02-15 MED ORDER — VANCOMYCIN HCL IN DEXTROSE 1-5 GM/200ML-% IV SOLN
1000.0000 mg | Freq: Once | INTRAVENOUS | Status: AC
  Administered 2016-02-15: 1000 mg via INTRAVENOUS
  Filled 2016-02-15: qty 200

## 2016-02-15 MED ORDER — ONDANSETRON HCL 4 MG PO TABS: 4.0000 mg | ORAL_TABLET | Freq: Four times a day (QID) | ORAL | Status: DC | PRN

## 2016-02-15 MED ORDER — POTASSIUM CHLORIDE 10 MEQ/100ML IV SOLN
10.0000 meq | Freq: Once | INTRAVENOUS | Status: DC
  Filled 2016-02-15: qty 100

## 2016-02-15 MED ORDER — DEXTROSE 5 % IV SOLN
1.0000 g | Freq: Three times a day (TID) | INTRAVENOUS | Status: DC
  Administered 2016-02-16 – 2016-02-17 (×5): 1 g via INTRAVENOUS
  Filled 2016-02-15 (×12): qty 1

## 2016-02-15 MED ORDER — SODIUM CHLORIDE 0.9 % IV SOLN
INTRAVENOUS | Status: DC
  Administered 2016-02-15 – 2016-02-20 (×7): via INTRAVENOUS

## 2016-02-15 MED ORDER — SODIUM CHLORIDE 0.9 % IV BOLUS (SEPSIS)
1000.0000 mL | Freq: Once | INTRAVENOUS | Status: AC
  Administered 2016-02-15: 1000 mL via INTRAVENOUS

## 2016-02-15 MED ORDER — CLORAZEPATE DIPOTASSIUM 7.5 MG PO TABS
7.5000 mg | ORAL_TABLET | Freq: Every day | ORAL | Status: DC
  Administered 2016-02-15 – 2016-02-19 (×5): 7.5 mg via ORAL
  Filled 2016-02-15 (×5): qty 1

## 2016-02-15 MED ORDER — LEVOTHYROXINE SODIUM 50 MCG PO TABS
50.0000 ug | ORAL_TABLET | Freq: Every day | ORAL | Status: DC
  Administered 2016-02-16 – 2016-02-20 (×5): 50 ug via ORAL
  Filled 2016-02-15 (×5): qty 1

## 2016-02-15 MED ORDER — ACETAMINOPHEN 325 MG PO TABS
650.0000 mg | ORAL_TABLET | Freq: Four times a day (QID) | ORAL | Status: DC | PRN
  Filled 2016-02-15 (×2): qty 2

## 2016-02-15 MED ORDER — MAGNESIUM SULFATE 2 GM/50ML IV SOLN
2.0000 g | Freq: Once | INTRAVENOUS | Status: AC
  Administered 2016-02-15: 2 g via INTRAVENOUS
  Filled 2016-02-15: qty 50

## 2016-02-15 MED ORDER — POTASSIUM CHLORIDE CRYS ER 20 MEQ PO TBCR
40.0000 meq | EXTENDED_RELEASE_TABLET | Freq: Once | ORAL | Status: AC
  Administered 2016-02-15: 40 meq via ORAL
  Filled 2016-02-15: qty 2

## 2016-02-15 MED ORDER — VANCOMYCIN HCL IN DEXTROSE 750-5 MG/150ML-% IV SOLN
750.0000 mg | INTRAVENOUS | Status: DC
  Administered 2016-02-16: 750 mg via INTRAVENOUS
  Filled 2016-02-15 (×3): qty 150

## 2016-02-15 MED ORDER — ASPIRIN EC 81 MG PO TBEC
81.0000 mg | DELAYED_RELEASE_TABLET | Freq: Every morning | ORAL | Status: DC
  Administered 2016-02-16 – 2016-02-20 (×5): 81 mg via ORAL
  Filled 2016-02-15 (×5): qty 1

## 2016-02-15 MED ORDER — ACETAMINOPHEN 650 MG RE SUPP: 650.0000 mg | Freq: Four times a day (QID) | RECTAL | Status: DC | PRN

## 2016-02-15 NOTE — H&P (Addendum)
TRH H&P   Patient Demographics:    Debra Richards, is a 82 y.o. female  MRN: [id number]  DOB - 11/06/1925  Admit Date - 02/15/2016  Outpatient Primary MD for the patient is Glenda Chroman, MD  Referring MD/NP/PA: Dr Dayna Barker  Patient coming from: Home    Chief complaint- generalized weakness   HPI:    Debra Richards  is a 82 y.o. female, With history of atrial fibrillation on aspirin, , coronary artery disease, stable, hypothyroidism controlled on Synthroid, was brought to the hospital for generalized weakness which started last night. As the patient she was seen at ENT clinic by Dr Benjamine Mola, and started on clindamycin for infected salivary gland. Patient took 1 tablet of clindamycin yesterday, and went to sleep. When she woke up at 2 AM to go to bathroom she felt extremely weak, slid onto the floor and was unable to get up. She was shivering and legs were cold. Patient lay on floor till 10 AM. Patient's son got home and help her get up. Patient had one episode of vomiting at doctor's office. She also had mild shortness of breath  She complains of pain in the right side of face. CT of neck done in the ED showed 2 mm stone in the left submandibular gland, did not comment anything about the right parotid gland.    Review of systems:    In addition to the HPI above,  +chills, No Headache, No changes with Vision or hearing, No problems swallowing food or Liquids, No Chest pain, Cough +Shortness of Breath, No Abdominal pain, No Nausea + Vomiting, bowel movements are regular, No Blood in stool or Urine, No dysuria, No new skin rashes or bruises, No new joints pains-aches,  No new weakness, tingling, numbness in any extremity, No recent weight gain or loss, No polyuria, polydypsia or polyphagia, No significant Mental Stressors.  A full 10 point Review of Systems was done, except as stated  above, all other Review of Systems were negative.   With Past History of the following :    Past Medical History:  Diagnosis Date  . A-fib ([place]) 2014   Cardionet  . Atrial fibrillation ([place])   . Coronary artery disease    CARDIOLOGIST- DR Rollene Fare-- LAST VISIT 04-04-11 (will request note)---   (per note cardiolite 2009 negative,  echo jan.2010  . Dysrhythmia    HX SVT AND PAF--- occ. palpitations  . Edema occasional in ankles  . GERD (gastroesophageal reflux disease)    CONTROLLED W/ ACIPHEX AND prn prevacid  . H/O Doppler ultrasound 2009   normal bilateral extremity venous duplex doppler  . H/O hiatal hernia   . History of kidney stones   . Hypothyroidism   . LBBB (left bundle branch block) CHRONIC   GXT, 2013  . Murmur 2010   no sig change per study  . MVP (mitral valve prolapse) MILD  . MVP (mitral valve prolapse) 2006   no evidence in study of MVP  . Osteoporosis 12/2011  T score -2.7  . RA (rheumatoid arthritis) ([place])    followed by dr Charlestine Night  . Right ureteral stone   . Rosacea   . SOB (shortness of breath) on exertion 2012   Echo, EF =50-55%, mild abnormalities      Past Surgical History:  Procedure Laterality Date  . CATARACT EXTRACTION W/ INTRAOCULAR LENS  IMPLANT, BILATERAL  4 yrs ago   both eyes  . CHOLECYSTECTOMY  2005  . EXTRACORPOREAL SHOCK WAVE LITHOTRIPSY  09-24-10   and 1990  . Femur fx    . HIP ARTHROPLASTY Right 10/12/2012   Procedure: ARTHROPLASTY BIPOLAR HIP;  Surgeon: Gearlean Alf, MD;  Location: [facility];  Service: Orthopedics;  Laterality: Right;  . KNEE ARTHROSCOPY  04/17/2011, right   Procedure: ARTHROSCOPY KNEE;  Surgeon: Gearlean Alf;  Location: [place];  Service: Orthopedics;  Laterality: Right;  WITH LATERAL MENISCAL DEBRIDEMENT  . MOLES EXCISED  2012   on nose  . Sternum Fx    . TOTAL KNEE ARTHROPLASTY  03/23/2012   Procedure: TOTAL KNEE ARTHROPLASTY;  Surgeon: Gearlean Alf, MD;  Location: [facility];   Service: Orthopedics;  Laterality: Right;  Marland Kitchen VAGINAL HYSTERECTOMY  1970   Leiomyomata      Social History:     Social History  Substance Use Topics  . Smoking status: Never Smoker  . Smokeless tobacco: Never Used  . Alcohol use No        Family History :     Family History  Problem Relation Age of Onset  . Cancer Mother     COLON  . Heart disease Father     STROKE  . Stroke Father   . Ovarian cancer Maternal Grandmother   . Crohn's disease Daughter       Home Medications:   Prior to Admission medications   Medication Sig Start Date End Date Taking? Authorizing Provider  aspirin EC 81 MG tablet Take 81 mg by mouth every morning.   Yes Historical Provider, MD  cholecalciferol (VITAMIN D) 1000 units tablet Take 1,000 Units by mouth every morning.   Yes Historical Provider, MD  clindamycin (CLEOCIN) 150 MG capsule Take 300 mg by mouth 3 (three) times daily. 7 day course starting on 02/14/2016 02/14/16  Yes Historical Provider, MD  clorazepate (TRANXENE) 7.5 MG tablet Take 7.5 mg by mouth at bedtime.    Yes Historical Provider, MD  denosumab (PROLIA) 60 MG/ML SOLN injection Inject 60 mg into the skin every 6 (six) months. Administer in upper arm, thigh, or abdomen   Yes Historical Provider, MD  levothyroxine (SYNTHROID, LEVOTHROID) 50 MCG tablet TAKE 1 TABLET DAILY BEFORE BREAKFAST. 05/03/15  Yes Troy Sine, MD  meclizine (ANTIVERT) 12.5 MG tablet Take 12.5 mg by mouth daily as needed for dizziness. Reported on 07/24/2015   Yes Historical Provider, MD  Propylene Glycol-Glycerin (SOOTHE) 0.6-0.6 % SOLN Apply 1 drop to eye daily as needed (for eye irritation/dry eye).   Yes Historical Provider, MD  ranitidine (ZANTAC) 150 MG tablet Take 75 mg by mouth at bedtime.  12/31/14  Yes Historical Provider, MD  vitamin E 400 UNIT capsule Take 400 Units by mouth every morning.    Yes Historical Provider, MD     Allergies:     Allergies  Allergen Reactions  . Contrast Media  [Iodinated Diagnostic Agents]     Arm swelled up and was painful after IVP years ago/   . Levaquin [Levofloxacin In D5w]  Made her sick all over  . Penicillins Hives    03/04/13: Pt has tolerated Amoxicillin/Unsyn without reaction  . Ciprofloxacin Rash     Physical Exam:   Vitals  Blood pressure 100/86, pulse 74, temperature 97.8 F (36.6 C), temperature source Oral, resp. rate 18, height 5\' 5"  (1.651 m), weight 58.1 kg (128 lb), SpO2 100 %.  1.  General Elderly Caucasian female, in mild distress, feeling extremely cold. Cooperative with examination  2. Psychiatric: Intact judgement and  insight, awake alert, oriented x 3.  3. Neurologic:No focal neurological deficits, all cranial nerves intact. Strength 5/5 all 4 extremities, sensation intact all 4 extremities, plantars down going.  4. Eyes show anicteric sclerae, moist conjunctivae with no lid lag. PERRLA.  5. ENMT: Right parotid gland swelling noted, warm to touch, positive induration, tender to palpation  5. Neck: supple, no cervical lymphadenopathy appriciated, No thyromegaly   6. Respiratory :Normal respiratory effort, good air movement bilaterally, clear to auscultation bilaterally.  7. Cardiovascular :RRR, no gallops, rubs or murmurs, no leg edema  8. GI: Positive bowel sounds, abdomen soft, no tenderness to palpation, no organomegaly appriciated,no rebound -guarding or rigidity.  9. Skin: No cyanosis, normal texture and turgor, no rash, lesions or ulcers  10.Musculoskeletal: Good muscle tone,  joints appear normal , no effusions, normal range of motion         Data Review:    CBC  Recent Labs Lab 02/15/16 1254  WBC 10.5  HGB 13.7  HCT 41.0  PLT 98*  MCV 98.1  MCH 32.8  MCHC 33.4  RDW 13.4   ------------------------------------------------------------------------------------------------------------------  Chemistries   Recent Labs Lab 02/15/16 1254  NA 135  K 3.2*  CL 103  CO2 23    GLUCOSE 134*  BUN 15  CREATININE 0.91  CALCIUM 8.5*   ------------------------------------------------------------------------------------------------------------------  ------------------------------------------------------------------------------------------------------------------ Cardiac Enzymes:  Recent Labs Lab 02/15/16 1254  CKTOTAL 456*    --------------------------------------------------------------------------------------------------------------- Urine analysis:    Component Value Date/Time   COLORURINE YELLOW 02/15/2016 [place] 02/15/2016 1237   LABSPEC <1.005 (L) 02/15/2016 1237   PHURINE 5.5 02/15/2016 1237   GLUCOSEU NEGATIVE 02/15/2016 1237   HGBUR NEGATIVE 02/15/2016 1237   BILIRUBINUR NEGATIVE 02/15/2016 1237   KETONESUR NEGATIVE 02/15/2016 1237   PROTEINUR NEGATIVE 02/15/2016 1237   UROBILINOGEN 1.0 01/01/2015 2140   NITRITE NEGATIVE 02/15/2016 1237   LEUKOCYTESUR TRACE (A) 02/15/2016 1237      ----------------------------------------------------------------------------------------------------------------   Imaging Results:    Ct Abdomen Pelvis Wo Contrast  Result Date: 02/15/2016 CLINICAL DATA:  One-day history of vomiting. EXAM: CT ABDOMEN AND PELVIS WITHOUT CONTRAST TECHNIQUE: Multidetector CT imaging of the abdomen and pelvis was performed following the standard protocol without IV contrast. COMPARISON:  None. FINDINGS: Lower chest: The lung bases are clear of acute process. Significant pectus deformity is noted with mass effect on the right ventricle. The distal esophagus is grossly normal. There is a small hiatal hernia. Hepatobiliary: No focal hepatic lesions or intrahepatic biliary dilatation. The gallbladder is surgically absent. Mild associated common bile duct dilatation. Pancreas: No mass, inflammation or ductal dilatation. Spleen: Normal size.  No focal lesions. Adrenals/Urinary Tract: The adrenal glands are unremarkable.  Multiple bilateral renal calculi but no worrisome renal lesions. No ureteral or bladder calculi. The lower pelvis is somewhat obscured by artifact from a right hip prosthesis. Stomach/Bowel: The stomach, duodenum, small bowel and colon are grossly normal. No inflammatory changes, mass lesions or obstructive findings. Contrast gets all the way to the  distal colon and rectum. There is moderate sigmoid diverticulosis but no findings for acute diverticulitis. The terminal ileum is normal. The appendix is normal. Vascular/Lymphatic: Scattered atherosclerotic calcifications involving the abdominal aorta and iliac arteries. No aneurysm. No mesenteric or retroperitoneal mass or adenopathy. Reproductive: The uterus is surgically absent. Both ovaries are still present and appear normal. Dense calcification noted on the right. Other: No inguinal mass or adenopathy. No abdominal wall hernia or subcutaneous lesions. Musculoskeletal: No significant bony findings. IMPRESSION: No acute abdominal/pelvic findings, mass lesions or adenopathy. Status post cholecystectomy with mild associated common bile duct dilatation. Bilateral renal calculi but no obstructing ureteral calculi or bladder calculi. Sigmoid diverticulosis without findings for acute diverticulitis. Electronically Signed   By: Marijo Sanes M.D.   On: 02/15/2016 17:32   Ct Soft Tissue Neck Wo Contrast  Result Date: 02/15/2016 CLINICAL DATA:  Difficulty swallowing and breathing.  Vomiting. EXAM: CT NECK WITHOUT CONTRAST TECHNIQUE: Multidetector CT imaging of the neck was performed following the standard protocol without intravenous contrast. COMPARISON:  None. FINDINGS: Pharynx and larynx: The nasopharynx is clear. The oropharynx and hypopharynx are normal. The epiglottis is normal. The supraglottic larynx, glottis and subglottic larynx are normal. No focal airway narrowing. No tonsillar hypertrophy. Salivary glands: Calcification in the area of the left submandibular  duct, likely nonobstructing stone. Salivary glands are otherwise normal. Thyroid: Normal Lymph nodes: No enlarged or abnormal appearing cervical lymph nodes. Vascular: Atherosclerotic calcification within the aortic arch. Normal course and caliber the major cervical vessels. Limited intracranial: Unremarkable Visualized orbits: Normal Mastoids and visualized paranasal sinuses: Normal Skeleton: No bony spinal canal stenosis. Multilevel facet arthrosis. No lytic or blastic lesions. Grade 1 anterolisthesis at C4-C5. Upper chest: Biapical pulmonary scarring. No pleural effusion or pneumothorax. Other: None IMPRESSION: 1. Normal appearance of the pharynx and larynx without airway narrowing. 2. Nonobstructing 2 mm left submandibular duct sialolith. 3. Aortic atherosclerosis Electronically Signed   By: Ulyses Jarred M.D.   On: 02/15/2016 17:44      Assessment & Plan:    Active Problems:   Weakness   Acute parotitis   1. Acute parotitis- patient presenting with generalized weakness, shivering, extremely tender right parotid gland. Will start patient on vancomycin and Azactam. We will obtain CT maxillofacial without contrast,(patient has allergy to contrast media)  to rule out underlying abscess. 2. Generalized weakness- likely due to developing sepsis, started on antibiotics. Will monitor. CK was 456 3. History of atrial fibrillation- heart rate is controlled, continue aspirin. 4. Hypothyroidism-continue Synthroid 5. Hypokalemia- replace potassium and check BMP in am.   DVT Prophylaxis-   Lovenox   AM Labs Ordered, also please review Full Orders  Family Communication: No family at bedside  Code Status:  Full code  Admission status: Observation    Time spent in minutes : 60 minutes   Rashaad Hallstrom S M.D on 02/15/2016 at 9:36 PM  Between 7am to 7pm - Pager - [phone]. After 7pm go to [email or web address] - password [facility]  [facility] - Office  [phone]

## 2016-02-15 NOTE — Progress Notes (Signed)
Pharmacy Antibiotic Note  Debra Richards is a 82 y.o. female admitted on 02/15/2016 with cellulitis / SIRS.  Pharmacy has been consulted for Vancomycin and Aztreonam dosing.  Plan: Vancomycin 1gm IV x 1 Vancomycin 750 mg IV every 24 hours.  Goal trough 15-20 mcg/mL. Aztreonam 1gm IV every 8 hours. Monitor labs, micro and vitals.   Height: 5\' 5"  (165.1 cm) Weight: 128 lb (58.1 kg) IBW/kg (Calculated) : 57  Temp (24hrs), Avg:97.8 F (36.6 C), Min:97.8 F (36.6 C), Max:97.8 F (36.6 C)   Recent Labs Lab 02/15/16 1254  WBC 10.5  CREATININE 0.91    Estimated Creatinine Clearance: 39.2 mL/min (by C-G formula based on SCr of 0.91 mg/dL).    Allergies  Allergen Reactions  . Contrast Media [Iodinated Diagnostic Agents]     Arm swelled up and was painful after IVP years ago/   . Levaquin [Levofloxacin In D5w]     Made her sick all over  . Penicillins Hives    03/04/13: Pt has tolerated Amoxicillin/Unsyn without reaction  . Ciprofloxacin Rash    Antimicrobials this admission: Vanc 9/21 >>  Aztreonam 9/21 >>   Dose adjustments this admission: n/a   Microbiology results: 9/21 BCx: pending  Thank you for allowing pharmacy to be a part of this patient's care.  Pricilla Larsson 02/15/2016 9:31 PM

## 2016-02-15 NOTE — ED Triage Notes (Signed)
Per daughter , pt has been weak for the last few weeks.   Pt states she got up to go to the bathroom at 3 am and could not get up and layed in the floor all night. Seen Dr. Benjamine Mola yesterday for swollen gland on right side of face.  Recently treated for an UTI.

## 2016-02-15 NOTE — ED Provider Notes (Signed)
[facility] DEPT Provider Note   CSN: [id number] Arrival date & time: 02/15/16  1221     History   Chief Complaint No chief complaint on file.   HPI Debra Richards is a 82 y.o. female.  Patient was started on clindamycin couple days ago or sialadenitis. She's had decreased by mouth intake over last couple days but last night she went to get up to go to the bathroom and her legs would not work and she fell on the ground. She laid on the ground for least 10 hours today She was not able to get herself back up off the ground. She does live by herself and she wasn't able to get up until her family was able to come help her. She doesn't have a specific pain anywhere but just complains of overall general weakness. She has not had any urinary symptoms, pulmonary symptoms or cardiac symptoms since this happened. She has a history of urinary tract infections but hasn't had those symptoms recently. She has no other modifying factors no other associated symptoms.      Past Medical History:  Diagnosis Date  . A-fib ([place]) 2014   Cardionet  . Atrial fibrillation ([place])   . Coronary artery disease    CARDIOLOGIST- DR Rollene Fare-- LAST VISIT 04-04-11 (will request note)---   (per note cardiolite 2009 negative,  echo jan.2010  . Dysrhythmia    HX SVT AND PAF--- occ. palpitations  . Edema occasional in ankles  . GERD (gastroesophageal reflux disease)    CONTROLLED W/ ACIPHEX AND prn prevacid  . H/O Doppler ultrasound 2009   normal bilateral extremity venous duplex doppler  . H/O hiatal hernia   . History of kidney stones   . Hypothyroidism   . LBBB (left bundle branch block) CHRONIC   GXT, 2013  . Murmur 2010   no sig change per study  . MVP (mitral valve prolapse) MILD  . MVP (mitral valve prolapse) 2006   no evidence in study of MVP  . Osteoporosis 12/2011    T score -2.7  . RA (rheumatoid arthritis) ([place])    followed by dr Charlestine Night  . Right ureteral stone   . Rosacea   .  SOB (shortness of breath) on exertion 2012   Echo, EF =50-55%, mild abnormalities    Patient Active Problem List   Diagnosis Date Noted  . Weakness 02/15/2016  . Atypical chest pain 10/27/2013  . Anxiety 10/27/2013  . Pectus excavatum 04/25/2013  . UTI (lower urinary tract infection) 02/28/2013  . Hypotension 02/28/2013  . Acute renal failure ([place]) 02/28/2013  . Generalized weakness 02/28/2013  . Thrombocytopenia ([place]) 02/28/2013  . Acute encephalopathy 02/28/2013  . Altered mental status 10/14/2012  . Hip fracture, right ([place]) 10/12/2012  . Postop Hypokalemia 03/27/2012  . Altered awareness, transient 03/25/2012  . Delirium 03/25/2012  . Leukocytosis 03/25/2012  . Postop Acute blood loss anemia 03/24/2012  . OA (osteoarthritis) of knee 03/23/2012  . Right ovarian cyst   . MVP (mitral valve prolapse)   . Coronary artery disease   . GERD (gastroesophageal reflux disease)   . History of kidney stones   . RA (rheumatoid arthritis) ([place])   . Dysrhythmia   . Lateral meniscal tear   . Rosacea   . Right ureteral stone   . LBBB (left bundle branch block)   . Edema   . Hypothyroidism   . GERD 01/22/2010  . CONSTIPATION 01/22/2010  . DYSPHAGIA UNSPECIFIED 01/22/2010  . ABDOMINAL PAIN RIGHT LOWER  QUADRANT 01/22/2010    Past Surgical History:  Procedure Laterality Date  . CATARACT EXTRACTION W/ INTRAOCULAR LENS  IMPLANT, BILATERAL  4 yrs ago   both eyes  . CHOLECYSTECTOMY  2005  . EXTRACORPOREAL SHOCK WAVE LITHOTRIPSY  09-24-10   and 1990  . Femur fx    . HIP ARTHROPLASTY Right 10/12/2012   Procedure: ARTHROPLASTY BIPOLAR HIP;  Surgeon: Gearlean Alf, MD;  Location: [facility];  Service: Orthopedics;  Laterality: Right;  . KNEE ARTHROSCOPY  04/17/2011, right   Procedure: ARTHROSCOPY KNEE;  Surgeon: Gearlean Alf;  Location: [facility];  Service: Orthopedics;  Laterality: Right;  WITH LATERAL MENISCAL DEBRIDEMENT  . MOLES EXCISED  2012   on nose  . Sternum  Fx    . TOTAL KNEE ARTHROPLASTY  03/23/2012   Procedure: TOTAL KNEE ARTHROPLASTY;  Surgeon: Gearlean Alf, MD;  Location: [facility];  Service: Orthopedics;  Laterality: Right;  Marland Kitchen VAGINAL HYSTERECTOMY  1970   Leiomyomata    OB History    Gravida Para Term Preterm AB Living   2 2 2     2    SAB TAB Ectopic Multiple Live Births                   Home Medications    Prior to Admission medications   Medication Sig Start Date End Date Taking? Authorizing Provider  aspirin EC 81 MG tablet Take 81 mg by mouth every morning.   Yes Historical Provider, MD  cholecalciferol (VITAMIN D) 1000 units tablet Take 1,000 Units by mouth every morning.   Yes Historical Provider, MD  clindamycin (CLEOCIN) 150 MG capsule Take 300 mg by mouth 3 (three) times daily. 7 day course starting on 02/14/2016 02/14/16  Yes Historical Provider, MD  clorazepate (TRANXENE) 7.5 MG tablet Take 7.5 mg by mouth at bedtime.    Yes Historical Provider, MD  denosumab (PROLIA) 60 MG/ML SOLN injection Inject 60 mg into the skin every 6 (six) months. Administer in upper arm, thigh, or abdomen   Yes Historical Provider, MD  levothyroxine (SYNTHROID, LEVOTHROID) 50 MCG tablet TAKE 1 TABLET DAILY BEFORE BREAKFAST. 05/03/15  Yes Troy Sine, MD  meclizine (ANTIVERT) 12.5 MG tablet Take 12.5 mg by mouth daily as needed for dizziness. Reported on 07/24/2015   Yes Historical Provider, MD  Propylene Glycol-Glycerin (SOOTHE) 0.6-0.6 % SOLN Apply 1 drop to eye daily as needed (for eye irritation/dry eye).   Yes Historical Provider, MD  ranitidine (ZANTAC) 150 MG tablet Take 75 mg by mouth at bedtime.  12/31/14  Yes Historical Provider, MD  vitamin E 400 UNIT capsule Take 400 Units by mouth every morning.    Yes Historical Provider, MD    Family History Family History  Problem Relation Age of Onset  . Cancer Mother     COLON  . Heart disease Father     STROKE  . Stroke Father   . Ovarian cancer Maternal Grandmother   . Crohn's disease  Daughter     Social History Social History  Substance Use Topics  . Smoking status: Never Smoker  . Smokeless tobacco: Never Used  . Alcohol use No     Allergies   Contrast media [iodinated diagnostic agents]; Levaquin [levofloxacin in d5w]; Penicillins; and Ciprofloxacin   Review of Systems Review of Systems  Constitutional: Positive for activity change and appetite change.  Neurological: Positive for weakness.  All other systems reviewed and are negative.    Physical Exam Updated  Vital Signs BP 103/62   Pulse 69   Temp 97.8 F (36.6 C) (Oral)   Resp 18   Ht 5\' 5"  (1.651 m)   Wt 128 lb (58.1 kg)   SpO2 100%   BMI 21.30 kg/m   Physical Exam  Constitutional: She appears well-developed and well-nourished. No distress.  HENT:  Head: Normocephalic and atraumatic.  Right cheek swelling and tenderness over submandibular area.  Eyes: Conjunctivae are normal.  Neck: Neck supple.  Cardiovascular: Normal rate and regular rhythm.   No murmur heard. Pulmonary/Chest: Effort normal and breath sounds normal. No respiratory distress. She has no rales.  Abdominal: Soft. There is tenderness (diffuse). There is no guarding.  Musculoskeletal: She exhibits no edema.  Neurological: She is alert.  Skin: Skin is warm and dry. No erythema. No pallor.  Psychiatric: She has a normal mood and affect.  Nursing note and vitals reviewed.    ED Treatments / Results  Labs (all labs ordered are listed, but only abnormal results are displayed) Labs Reviewed  BASIC METABOLIC PANEL - Abnormal; Notable for the following:       Result Value   Potassium 3.2 (*)    Glucose, Bld 134 (*)    Calcium 8.5 (*)    GFR calc non Af Amer 55 (*)    All other components within normal limits  CBC - Abnormal; Notable for the following:    Platelets 98 (*)    All other components within normal limits  URINALYSIS, ROUTINE W REFLEX MICROSCOPIC (NOT AT [facility]) - Abnormal; Notable for the following:     Specific Gravity, Urine <1.005 (*)    Leukocytes, UA TRACE (*)    All other components within normal limits  CK - Abnormal; Notable for the following:    Total CK 456 (*)    All other components within normal limits  URINE MICROSCOPIC-ADD ON - Abnormal; Notable for the following:    Squamous Epithelial / LPF 0-5 (*)    Bacteria, UA RARE (*)    All other components within normal limits    EKG  EKG Interpretation None       Radiology Ct Abdomen Pelvis Wo Contrast  Result Date: 02/15/2016 CLINICAL DATA:  One-day history of vomiting. EXAM: CT ABDOMEN AND PELVIS WITHOUT CONTRAST TECHNIQUE: Multidetector CT imaging of the abdomen and pelvis was performed following the standard protocol without IV contrast. COMPARISON:  None. FINDINGS: Lower chest: The lung bases are clear of acute process. Significant pectus deformity is noted with mass effect on the right ventricle. The distal esophagus is grossly normal. There is a small hiatal hernia. Hepatobiliary: No focal hepatic lesions or intrahepatic biliary dilatation. The gallbladder is surgically absent. Mild associated common bile duct dilatation. Pancreas: No mass, inflammation or ductal dilatation. Spleen: Normal size.  No focal lesions. Adrenals/Urinary Tract: The adrenal glands are unremarkable. Multiple bilateral renal calculi but no worrisome renal lesions. No ureteral or bladder calculi. The lower pelvis is somewhat obscured by artifact from a right hip prosthesis. Stomach/Bowel: The stomach, duodenum, small bowel and colon are grossly normal. No inflammatory changes, mass lesions or obstructive findings. Contrast gets all the way to the distal colon and rectum. There is moderate sigmoid diverticulosis but no findings for acute diverticulitis. The terminal ileum is normal. The appendix is normal. Vascular/Lymphatic: Scattered atherosclerotic calcifications involving the abdominal aorta and iliac arteries. No aneurysm. No mesenteric or  retroperitoneal mass or adenopathy. Reproductive: The uterus is surgically absent. Both ovaries are still present and appear  normal. Dense calcification noted on the right. Other: No inguinal mass or adenopathy. No abdominal wall hernia or subcutaneous lesions. Musculoskeletal: No significant bony findings. IMPRESSION: No acute abdominal/pelvic findings, mass lesions or adenopathy. Status post cholecystectomy with mild associated common bile duct dilatation. Bilateral renal calculi but no obstructing ureteral calculi or bladder calculi. Sigmoid diverticulosis without findings for acute diverticulitis. Electronically Signed   By: Marijo Sanes M.D.   On: 02/15/2016 17:32   Ct Soft Tissue Neck Wo Contrast  Result Date: 02/15/2016 CLINICAL DATA:  Difficulty swallowing and breathing.  Vomiting. EXAM: CT NECK WITHOUT CONTRAST TECHNIQUE: Multidetector CT imaging of the neck was performed following the standard protocol without intravenous contrast. COMPARISON:  None. FINDINGS: Pharynx and larynx: The nasopharynx is clear. The oropharynx and hypopharynx are normal. The epiglottis is normal. The supraglottic larynx, glottis and subglottic larynx are normal. No focal airway narrowing. No tonsillar hypertrophy. Salivary glands: Calcification in the area of the left submandibular duct, likely nonobstructing stone. Salivary glands are otherwise normal. Thyroid: Normal Lymph nodes: No enlarged or abnormal appearing cervical lymph nodes. Vascular: Atherosclerotic calcification within the aortic arch. Normal course and caliber the major cervical vessels. Limited intracranial: Unremarkable Visualized orbits: Normal Mastoids and visualized paranasal sinuses: Normal Skeleton: No bony spinal canal stenosis. Multilevel facet arthrosis. No lytic or blastic lesions. Grade 1 anterolisthesis at C4-C5. Upper chest: Biapical pulmonary scarring. No pleural effusion or pneumothorax. Other: None IMPRESSION: 1. Normal appearance of the  pharynx and larynx without airway narrowing. 2. Nonobstructing 2 mm left submandibular duct sialolith. 3. Aortic atherosclerosis Electronically Signed   By: Ulyses Jarred M.D.   On: 02/15/2016 17:44    Procedures Procedures (including critical care time)  Medications Ordered in ED Medications  potassium chloride 10 mEq in 100 mL IVPB (0 mEq Intravenous Hold 02/15/16 1628)  sodium chloride 0.9 % bolus 1,000 mL (0 mLs Intravenous Stopped 02/15/16 1800)  magnesium sulfate IVPB 2 g 50 mL (0 g Intravenous Stopped 02/15/16 1727)  potassium chloride SA (K-DUR,KLOR-CON) CR tablet 40 mEq (40 mEq Oral Given 02/15/16 1626)  sodium chloride 0.9 % bolus 1,000 mL (0 mLs Intravenous Stopped 02/15/16 2004)  sodium chloride 0.9 % bolus 1,000 mL (1,000 mLs Intravenous New Bag/Given 02/15/16 2005)     Initial Impression / Assessment and Plan / ED Course  I have reviewed the triage vital signs and the nursing notes.  Pertinent labs & imaging results that were available during my care of the patient were reviewed by me and considered in my medical decision making (see chart for details).  Clinical Course   A 51-year-old female with possibly dehydration versus malnutrition causing her weakness. Workup here largely negative for any acute causes. However when trying daily in the emergency department she'll he made it 3 or 4 feet her whole body got very shaky and her legs started to collapse in the wheelchair had to be placed underneath her to get her Bactrim bed and she was unable stand up again and had to be lifted back in the bed. Even on reexamination she still has no obvious focal neurologic deficits just a general weakness I think stroke is unlikely. She's had liters of fluid and this did not improve her symptoms either some dehydration is lower on my differential. I am unsure of the cause of her symptoms so we'll admit to the hospitalist for physical therapy and further management..  Final Clinical Impressions(s) /  ED Diagnoses   Final diagnoses:  Weakness    New  Prescriptions New Prescriptions   No medications on file     Merrily Pew, MD 02/15/16 2022

## 2016-02-16 DIAGNOSIS — Z823 Family history of stroke: Secondary | ICD-10-CM | POA: Diagnosis not present

## 2016-02-16 DIAGNOSIS — D696 Thrombocytopenia, unspecified: Secondary | ICD-10-CM

## 2016-02-16 DIAGNOSIS — D649 Anemia, unspecified: Secondary | ICD-10-CM | POA: Diagnosis present

## 2016-02-16 DIAGNOSIS — Z7982 Long term (current) use of aspirin: Secondary | ICD-10-CM | POA: Diagnosis not present

## 2016-02-16 DIAGNOSIS — Z87442 Personal history of urinary calculi: Secondary | ICD-10-CM | POA: Diagnosis not present

## 2016-02-16 DIAGNOSIS — Z87440 Personal history of urinary (tract) infections: Secondary | ICD-10-CM | POA: Diagnosis not present

## 2016-02-16 DIAGNOSIS — Z8041 Family history of malignant neoplasm of ovary: Secondary | ICD-10-CM | POA: Diagnosis not present

## 2016-02-16 DIAGNOSIS — I480 Paroxysmal atrial fibrillation: Secondary | ICD-10-CM | POA: Diagnosis present

## 2016-02-16 DIAGNOSIS — R531 Weakness: Secondary | ICD-10-CM | POA: Diagnosis not present

## 2016-02-16 DIAGNOSIS — I482 Chronic atrial fibrillation: Secondary | ICD-10-CM | POA: Diagnosis not present

## 2016-02-16 DIAGNOSIS — E039 Hypothyroidism, unspecified: Secondary | ICD-10-CM | POA: Diagnosis not present

## 2016-02-16 DIAGNOSIS — M810 Age-related osteoporosis without current pathological fracture: Secondary | ICD-10-CM | POA: Diagnosis not present

## 2016-02-16 DIAGNOSIS — K219 Gastro-esophageal reflux disease without esophagitis: Secondary | ICD-10-CM | POA: Diagnosis present

## 2016-02-16 DIAGNOSIS — Z96641 Presence of right artificial hip joint: Secondary | ICD-10-CM | POA: Diagnosis present

## 2016-02-16 DIAGNOSIS — M069 Rheumatoid arthritis, unspecified: Secondary | ICD-10-CM | POA: Diagnosis present

## 2016-02-16 DIAGNOSIS — Z96651 Presence of right artificial knee joint: Secondary | ICD-10-CM | POA: Diagnosis present

## 2016-02-16 DIAGNOSIS — Z8249 Family history of ischemic heart disease and other diseases of the circulatory system: Secondary | ICD-10-CM | POA: Diagnosis not present

## 2016-02-16 DIAGNOSIS — E876 Hypokalemia: Secondary | ICD-10-CM | POA: Diagnosis not present

## 2016-02-16 DIAGNOSIS — I2510 Atherosclerotic heart disease of native coronary artery without angina pectoris: Secondary | ICD-10-CM | POA: Diagnosis present

## 2016-02-16 DIAGNOSIS — K1121 Acute sialoadenitis: Secondary | ICD-10-CM | POA: Diagnosis not present

## 2016-02-16 DIAGNOSIS — R410 Disorientation, unspecified: Secondary | ICD-10-CM | POA: Diagnosis present

## 2016-02-16 DIAGNOSIS — Z9049 Acquired absence of other specified parts of digestive tract: Secondary | ICD-10-CM | POA: Diagnosis not present

## 2016-02-16 DIAGNOSIS — I10 Essential (primary) hypertension: Secondary | ICD-10-CM | POA: Diagnosis present

## 2016-02-16 LAB — CBC
HCT: 33.8 % — ABNORMAL LOW (ref 36.0–46.0)
Hemoglobin: 11.1 g/dL — ABNORMAL LOW (ref 12.0–15.0)
MCH: 32.3 pg (ref 26.0–34.0)
MCHC: 32.8 g/dL (ref 30.0–36.0)
MCV: 98.3 fL (ref 78.0–100.0)
PLATELETS: 90 10*3/uL — AB (ref 150–400)
RBC: 3.44 MIL/uL — AB (ref 3.87–5.11)
RDW: 13.7 % (ref 11.5–15.5)
WBC: 5.6 10*3/uL (ref 4.0–10.5)

## 2016-02-16 LAB — COMPREHENSIVE METABOLIC PANEL
ALBUMIN: 2.7 g/dL — AB (ref 3.5–5.0)
ALT: 18 U/L (ref 14–54)
AST: 32 U/L (ref 15–41)
Alkaline Phosphatase: 43 U/L (ref 38–126)
BUN: 9 mg/dL (ref 6–20)
CALCIUM: 7 mg/dL — AB (ref 8.9–10.3)
CO2: 22 mmol/L (ref 22–32)
CREATININE: 0.58 mg/dL (ref 0.44–1.00)
Chloride: 114 mmol/L — ABNORMAL HIGH (ref 101–111)
GFR calc Af Amer: >60 mL/min (ref >60)
GLUCOSE: 101 mg/dL — AB (ref 65–99)
Potassium: 3.9 mmol/L (ref 3.5–5.1)
Sodium: 138 mmol/L (ref 135–145)
Total Bilirubin: 0.5 mg/dL (ref 0.3–1.2)
Total Protein: 4.9 g/dL — ABNORMAL LOW (ref 6.5–8.1)

## 2016-02-16 MED ORDER — AZTREONAM 1 G IJ SOLR
INTRAMUSCULAR | Status: AC
  Filled 2016-02-16: qty 1

## 2016-02-16 MED ORDER — KETOROLAC TROMETHAMINE 15 MG/ML IJ SOLN
15.0000 mg | Freq: Four times a day (QID) | INTRAMUSCULAR | Status: AC
Start: 2016-02-16 — End: 2016-02-18
  Administered 2016-02-16 – 2016-02-18 (×8): 15 mg via INTRAVENOUS
  Filled 2016-02-16 (×8): qty 1

## 2016-02-16 NOTE — Care Management Obs Status (Signed)
[facility] NOTIFICATION   Patient Details  Name: Debra Richards MRN: [id number] Date of Birth: 20-Aug-1925   Medicare Observation Status Notification Given:  Yes    Emrys Mceachron, Chauncey Reading, RN 02/16/2016, 11:13 AM

## 2016-02-16 NOTE — Progress Notes (Addendum)
PROGRESS NOTE    JAQUELYNN BLAKENSHIP  [id number] DOB: 08-17-1925 DOA: 02/15/2016 PCP: Glenda Chroman, MD    Brief Narrative:  47 yof with a hx of afib, CAD, GERD, hypothyroidism, and RA presented with complaints of generalized weakness and right jaw swelling. Patient was recently seen by her ENT physician and started on clindamycin for suspected parotitis. When her symptoms became worse, she came to the ER for evaluation.  Assessment & Plan:   Active Problems:   Hypothyroidism   Weakness   Acute parotitis   Atrial fibrillation (HCC)   Hypokalemia  1. Acute parotitis. pateint presented with an extremely tender right parotid gland. CT of maxillofacial revealed 6 mm calculus in Stensen's duct on the right with marked inflammation of the anterior portion of the parotid gland and surrounding soft tissues. Will continue Vancomycin and Azactam.  2. Generalized weakness. Likely due to acute illness. She had a CK of 456 on admission. Appears to be improving since admission. Continue to monitor.  3. Hx of Afib. Controlled at this time. Continue ASA.  4. Hypothyroidism. Continue synthroid.  5. Hypokalemia. Replaced.  6. HTN. Stable. Continue outpatient medications.  7. GERD. Continue PPI.  8. Hx of CAD.  9. Thrombocytopenia. Chronic. Continue to follow   DVT prophylaxis: SCDs Code Status: Full  Family Communication: No family bedside Disposition Plan: Discharge home once improved.    Consultants:   None   Procedures:   None   Antimicrobials:   Vancomycin 9/22 >>   Azactam 9/22>>   Subjective: Continues to have pain in right jaw area that radiates behind her ear and up to her forehead.  Objective: Vitals:   02/15/16 1830 02/15/16 1945 02/15/16 2251 02/16/16 0400  BP: 103/62 100/86 (!) 102/56 100/63  Pulse: 69 74 78 74  Resp:   18 16  Temp:   98.2 F (36.8 C) 98.5 F (36.9 C)  TempSrc:   Oral Oral  SpO2: 100% 100% 100% 99%  Weight:   58.2 kg (128 lb 3.2 oz)     Height:   5' (1.524 m)     Intake/Output Summary (Last 24 hours) at 02/16/16 0832 Last data filed at 02/16/16 0500  Gross per 24 hour  Intake             1290 ml  Output                0 ml  Net             1290 ml   Filed Weights   02/15/16 1232 02/15/16 2251  Weight: 58.1 kg (128 lb) 58.2 kg (128 lb 3.2 oz)    Examination:  General exam: Appears calm and comfortable  HEENT: swelling and tenderness noted over right parotid gland. Overlying erythema noted. Respiratory system: Clear to auscultation. Respiratory effort normal. Cardiovascular system: S1 & S2 heard, RRR. No JVD, murmurs, rubs, gallops or clicks. No pedal edema. Gastrointestinal system: Abdomen is nondistended, soft and nontender. No organomegaly or masses felt. Normal bowel sounds heard. Central nervous system: Alert and oriented. No focal neurological deficits. Extremities: Symmetric 5 x 5 power. Skin: No rashes, lesions or ulcers Psychiatry: Judgement and insight appear normal. Mood & affect appropriate.     Data Reviewed: I have personally reviewed following labs and imaging studies  CBC:  Recent Labs Lab 02/15/16 1254 02/16/16 0632  WBC 10.5 5.6  HGB 13.7 11.1*  HCT 41.0 33.8*  MCV 98.1 98.3  PLT 98* 90*   Basic  Metabolic Panel:  Recent Labs Lab 02/15/16 1254 02/16/16 0632  NA 135 138  K 3.2* 3.9  CL 103 114*  CO2 23 22  GLUCOSE 134* 101*  BUN 15 9  CREATININE 0.91 0.58  CALCIUM 8.5* 7.0*   GFR: Estimated Creatinine Clearance: 39.6 mL/min (by C-G formula based on SCr of 0.58 mg/dL). Liver Function Tests:  Recent Labs Lab 02/16/16 0632  AST 32  ALT 18  ALKPHOS 43  BILITOT 0.5  PROT 4.9*  ALBUMIN 2.7*   No results for input(s): LIPASE, AMYLASE in the last 168 hours. No results for input(s): AMMONIA in the last 168 hours. Coagulation Profile: No results for input(s): INR, PROTIME in the last 168 hours. Cardiac Enzymes:  Recent Labs Lab 02/15/16 1254  CKTOTAL 456*   BNP  (last 3 results) No results for input(s): PROBNP in the last 8760 hours. HbA1C: No results for input(s): HGBA1C in the last 72 hours. CBG: No results for input(s): GLUCAP in the last 168 hours. Lipid Profile: No results for input(s): CHOL, HDL, LDLCALC, TRIG, CHOLHDL, LDLDIRECT in the last 72 hours. Thyroid Function Tests: No results for input(s): TSH, T4TOTAL, FREET4, T3FREE, THYROIDAB in the last 72 hours. Anemia Panel: No results for input(s): VITAMINB12, FOLATE, FERRITIN, TIBC, IRON, RETICCTPCT in the last 72 hours. Sepsis Labs: No results for input(s): PROCALCITON, LATICACIDVEN in the last 168 hours.  Recent Results (from the past 240 hour(s))  Culture, blood (Routine X 2) w Reflex to ID Panel     Status: None (Preliminary result)   Collection Time: 02/15/16 10:18 PM  Result Value Ref Range Status   Specimen Description BLOOD LEFT ARM  Final   Special Requests BOTTLES DRAWN AEROBIC AND ANAEROBIC 6CC  Final   Culture PENDING  Incomplete   Report Status PENDING  Incomplete  Culture, blood (Routine X 2) w Reflex to ID Panel     Status: None (Preliminary result)   Collection Time: 02/15/16 10:22 PM  Result Value Ref Range Status   Specimen Description BLOOD LEFT HAND  Final   Special Requests BOTTLES DRAWN AEROBIC AND ANAEROBIC [facility]  Final   Culture PENDING  Incomplete   Report Status PENDING  Incomplete         Radiology Studies: Ct Abdomen Pelvis Wo Contrast  Result Date: 02/15/2016 CLINICAL DATA:  One-day history of vomiting. EXAM: CT ABDOMEN AND PELVIS WITHOUT CONTRAST TECHNIQUE: Multidetector CT imaging of the abdomen and pelvis was performed following the standard protocol without IV contrast. COMPARISON:  None. FINDINGS: Lower chest: The lung bases are clear of acute process. Significant pectus deformity is noted with mass effect on the right ventricle. The distal esophagus is grossly normal. There is a small hiatal hernia. Hepatobiliary: No focal hepatic lesions or  intrahepatic biliary dilatation. The gallbladder is surgically absent. Mild associated common bile duct dilatation. Pancreas: No mass, inflammation or ductal dilatation. Spleen: Normal size.  No focal lesions. Adrenals/Urinary Tract: The adrenal glands are unremarkable. Multiple bilateral renal calculi but no worrisome renal lesions. No ureteral or bladder calculi. The lower pelvis is somewhat obscured by artifact from a right hip prosthesis. Stomach/Bowel: The stomach, duodenum, small bowel and colon are grossly normal. No inflammatory changes, mass lesions or obstructive findings. Contrast gets all the way to the distal colon and rectum. There is moderate sigmoid diverticulosis but no findings for acute diverticulitis. The terminal ileum is normal. The appendix is normal. Vascular/Lymphatic: Scattered atherosclerotic calcifications involving the abdominal aorta and iliac arteries. No aneurysm. No mesenteric or  retroperitoneal mass or adenopathy. Reproductive: The uterus is surgically absent. Both ovaries are still present and appear normal. Dense calcification noted on the right. Other: No inguinal mass or adenopathy. No abdominal wall hernia or subcutaneous lesions. Musculoskeletal: No significant bony findings. IMPRESSION: No acute abdominal/pelvic findings, mass lesions or adenopathy. Status post cholecystectomy with mild associated common bile duct dilatation. Bilateral renal calculi but no obstructing ureteral calculi or bladder calculi. Sigmoid diverticulosis without findings for acute diverticulitis. Electronically Signed   By: Marijo Sanes M.D.   On: 02/15/2016 17:32   Ct Soft Tissue Neck Wo Contrast  Result Date: 02/15/2016 CLINICAL DATA:  Difficulty swallowing and breathing.  Vomiting. EXAM: CT NECK WITHOUT CONTRAST TECHNIQUE: Multidetector CT imaging of the neck was performed following the standard protocol without intravenous contrast. COMPARISON:  None. FINDINGS: Pharynx and larynx: The  nasopharynx is clear. The oropharynx and hypopharynx are normal. The epiglottis is normal. The supraglottic larynx, glottis and subglottic larynx are normal. No focal airway narrowing. No tonsillar hypertrophy. Salivary glands: Calcification in the area of the left submandibular duct, likely nonobstructing stone. Salivary glands are otherwise normal. Thyroid: Normal Lymph nodes: No enlarged or abnormal appearing cervical lymph nodes. Vascular: Atherosclerotic calcification within the aortic arch. Normal course and caliber the major cervical vessels. Limited intracranial: Unremarkable Visualized orbits: Normal Mastoids and visualized paranasal sinuses: Normal Skeleton: No bony spinal canal stenosis. Multilevel facet arthrosis. No lytic or blastic lesions. Grade 1 anterolisthesis at C4-C5. Upper chest: Biapical pulmonary scarring. No pleural effusion or pneumothorax. Other: None IMPRESSION: 1. Normal appearance of the pharynx and larynx without airway narrowing. 2. Nonobstructing 2 mm left submandibular duct sialolith. 3. Aortic atherosclerosis Electronically Signed   By: Ulyses Jarred M.D.   On: 02/15/2016 17:44   Ct Maxillofacial Wo Contrast  Result Date: 02/15/2016 CLINICAL DATA:  Right-sided facial pain. EXAM: CT MAXILLOFACIAL WITHOUT CONTRAST TECHNIQUE: Multidetector CT imaging of the maxillofacial structures was performed. Multiplanar CT image reconstructions were also generated. A small metallic BB was placed on the right temple in order to reliably differentiate right from left. COMPARISON:  Neck CT same date. FINDINGS: Osseous: No acute facial bone fractures.  No worrisome bone lesions. Orbits: No orbital fractures. No intraorbital mass or hematoma. The optic nerves appear normal. Sinuses: Clear. Soft tissues: Fairly marked fatty atrophy of the right parotid gland with probable small intra parotid ductal calculi. There is a large, 6 mm calculus in the distal parotid duct (Stensen's duct) with marked  inflammation involving the entire right side of the face and inflammation of the remaining parotid gland. Limited intracranial: Age related cerebral atrophy and ventriculomegaly. No acute intracranial findings or mass lesion. Other: As demonstrated on the neck CT there is a small calculus in the region of the left submandibular duct. No findings to suggest obstruction or sialadenitis. IMPRESSION: 1. 6 mm calculus in Stensen's duct on the right with marked inflammation of the anterior portion of the parotid gland and surrounding soft tissues. The main portion of the gland is atrophied and fatty. This could be due to chronic sialadenitis. 2. 2 mm calculus in the left submandibular duct but no findings for obstruction or inflammation. Electronically Signed   By: Marijo Sanes M.D.   On: 02/15/2016 23:34        Scheduled Meds: . aspirin EC  81 mg Oral q morning - 10a  . aztreonam  1 g Intravenous Q8H  . clorazepate  7.5 mg Oral QHS  . enoxaparin (LOVENOX) injection  40 mg Subcutaneous  Q24H  . famotidine  20 mg Oral QHS  . levothyroxine  50 mcg Oral QAC breakfast  . potassium chloride  10 mEq Intravenous Once  . vancomycin  750 mg Intravenous Q24H   Continuous Infusions: . sodium chloride 100 mL/hr at 02/15/16 2318     LOS: 0 days    Time spent: 25 minutes     Kathie Dike, MD [facility] If 7PM-7AM, please contact night-coverage [email or web address] Password [facility] 02/16/2016, 8:32 AM

## 2016-02-16 NOTE — Care Management Note (Signed)
Case Management Note  Patient Details  Name: Debra Richards MRN: [id number] Date of Birth: 01-11-1926  Subjective/Objective:  Patient adm from home with weakness and parotitis. She has family support, son and daughter check in with her daily. She is ind with ADL's, has a cane and walker at home. She still drives, has a PCP and insurance. Reports no issues. She does not feel that she would benefit from [facility] at this time. She does share that she may get a friend to stay with her at night for a while until she feels stronger on her feet. She reports having OP PT in [place] a couple of times a week.              Action/Plan: Anticipate DC home with self care and family support.    Expected Discharge Date:     02/17/2016            Expected Discharge Plan:  Home/Self Care  In-House Referral:  NA  Discharge planning Services  CM Consult  Post Acute Care Choice:    Choice offered to:     DME Arranged:    DME Agency:     HH Arranged:    HH Agency:     Status of Service:  In process, will continue to follow  If discussed at Long Length of Stay Meetings, dates discussed:    Additional Comments:  Kaitlyn Skowron, Chauncey Reading, RN 02/16/2016, 11:06 AM

## 2016-02-17 MED ORDER — CLINDAMYCIN PHOSPHATE 600 MG/50ML IV SOLN
600.0000 mg | Freq: Three times a day (TID) | INTRAVENOUS | Status: DC
  Administered 2016-02-17 – 2016-02-18 (×3): 600 mg via INTRAVENOUS
  Filled 2016-02-17 (×7): qty 50

## 2016-02-17 MED ORDER — HALOPERIDOL LACTATE 5 MG/ML IJ SOLN
2.0000 mg | Freq: Four times a day (QID) | INTRAMUSCULAR | Status: DC | PRN
  Administered 2016-02-17 – 2016-02-19 (×2): 2 mg via INTRAVENOUS
  Filled 2016-02-17 (×2): qty 1

## 2016-02-17 MED ORDER — ALPRAZOLAM 0.25 MG PO TABS
0.2500 mg | ORAL_TABLET | Freq: Two times a day (BID) | ORAL | Status: DC | PRN
  Filled 2016-02-17: qty 1

## 2016-02-17 MED ORDER — LORAZEPAM 0.5 MG PO TABS
0.5000 mg | ORAL_TABLET | Freq: Every day | ORAL | Status: DC
  Administered 2016-02-17 – 2016-02-19 (×3): 0.5 mg via ORAL
  Filled 2016-02-17 (×3): qty 1

## 2016-02-17 NOTE — Progress Notes (Signed)
PROGRESS NOTE    Debra Richards  [id number] DOB: 1925/11/01 DOA: 02/15/2016 PCP: Glenda Chroman, MD   Brief Narrative:  42 yof with a hx of afib, CAD, GERD, hypothyroidism, and RA presented with complaints of generalized weakness and right jaw swelling. Patient was recently seen by her ENT physician and started on clindamycin for suspected parotitis. When her symptoms became worse, she came to the ER for evaluation.  Assessment & Plan:   Active Problems:   Hypothyroidism   Thrombocytopenia (HCC)   Weakness   Acute parotitis   Atrial fibrillation (HCC)   Hypokalemia  1. Acute parotitis. Pateint presented with an extremely tender right parotid gland. CT of maxillofacial revealed 6 mm calculus in Stensen's duct on the right with marked inflammation of the anterior portion of the parotid gland and surrounding soft tissues. Initially started on Vancomycin and Azactam with mild improvement. Will transition to Clindamycin. No reported abscess on imaging. She will likely need follow up with ENT once discharged. 2. Generalized weakness. Likely due to acute illness. She had a CK of 456 on admission. Appears to be improving since admission. Continue to monitor.  3. Hx of Afib. Controlled at this time. Continue ASA.  4. Hypothyroidism. Continue synthroid.  5. Hypokalemia. Replaced.  6. HTN. Stable. Continue outpatient medications. 7. GERD. Continue PPI.  8. Hx of CAD.  9. Thrombocytopenia. Chronic. Continue to follow 10. Confusion. Likely related to advanced age and change in familiar environment. Continue to follow up.   DVT prophylaxis:  SCDs  Code Status: Full  Family Communication: Son at bedside. Discussed with daughter Barnett Applebaum. Disposition Plan: Discharge home once improved   Consultants:   None  Procedures:   None  Antimicrobials:   Vancomycin 9/22 >> 9/23  Azactam 9/22 >> 9/23  Clindamycin 9/23 >>   Subjective: Feeling better. Jaw pain is similar to  yesterday. Feels less tight. She was confused over night. Patient's family states concern, as antibiotics have caused the patient to act differently in the past.  Objective: Vitals:   02/16/16 0400 02/16/16 1415 02/16/16 2240 02/17/16 0651  BP: 100/63 110/75 (!) 115/42 (!) 133/48  Pulse: 74 71 63 66  Resp: 16 18 17 18   Temp: 98.5 F (36.9 C) 98.2 F (36.8 C) 97.7 F (36.5 C) 97.9 F (36.6 C)  TempSrc: Oral Oral Oral Oral  SpO2: 99% 99% 100% 99%  Weight:      Height:        Intake/Output Summary (Last 24 hours) at 02/17/16 0909 Last data filed at 02/17/16 0300  Gross per 24 hour  Intake             3600 ml  Output                0 ml  Net             3600 ml   Filed Weights   02/15/16 1232 02/15/16 2251  Weight: 58.1 kg (128 lb) 58.2 kg (128 lb 3.2 oz)    Examination:  General exam: Appears calm and comfortable  HEENT: Persistent swelling and tenderness of right carotid. Swelling has slightly improved and overlying erythema has improved. Respiratory system: Clear to auscultation. Respiratory effort normal. Cardiovascular system: S1 & S2 heard, RRR. No JVD, murmurs, rubs, gallops or clicks. No pedal edema. Gastrointestinal system: Abdomen is nondistended, soft and nontender. No organomegaly or masses felt. Normal bowel sounds heard. Central nervous system: No focal neurological deficits. Extremities: Symmetric 5 x 5 power.  Skin: No rashes, lesions or ulcers Psychiatry:  Mildly confused.    Data Reviewed: I have personally reviewed following labs and imaging studies  CBC:  Recent Labs Lab 02/15/16 1254 02/16/16 0632  WBC 10.5 5.6  HGB 13.7 11.1*  HCT 41.0 33.8*  MCV 98.1 98.3  PLT 98* 90*   Basic Metabolic Panel:  Recent Labs Lab 02/15/16 1254 02/16/16 0632  NA 135 138  K 3.2* 3.9  CL 103 114*  CO2 23 22  GLUCOSE 134* 101*  BUN 15 9  CREATININE 0.91 0.58  CALCIUM 8.5* 7.0*   GFR: Estimated Creatinine Clearance: 39.6 mL/min (by C-G formula based  on SCr of 0.58 mg/dL). Liver Function Tests:  Recent Labs Lab 02/16/16 0632  AST 32  ALT 18  ALKPHOS 43  BILITOT 0.5  PROT 4.9*  ALBUMIN 2.7*   No results for input(s): LIPASE, AMYLASE in the last 168 hours. No results for input(s): AMMONIA in the last 168 hours. Coagulation Profile: No results for input(s): INR, PROTIME in the last 168 hours. Cardiac Enzymes:  Recent Labs Lab 02/15/16 1254  CKTOTAL 456*   BNP (last 3 results) No results for input(s): PROBNP in the last 8760 hours. HbA1C: No results for input(s): HGBA1C in the last 72 hours. CBG: No results for input(s): GLUCAP in the last 168 hours. Lipid Profile: No results for input(s): CHOL, HDL, LDLCALC, TRIG, CHOLHDL, LDLDIRECT in the last 72 hours. Thyroid Function Tests: No results for input(s): TSH, T4TOTAL, FREET4, T3FREE, THYROIDAB in the last 72 hours. Anemia Panel: No results for input(s): VITAMINB12, FOLATE, FERRITIN, TIBC, IRON, RETICCTPCT in the last 72 hours. Sepsis Labs: No results for input(s): PROCALCITON, LATICACIDVEN in the last 168 hours.  Recent Results (from the past 240 hour(s))  Culture, blood (Routine X 2) w Reflex to ID Panel     Status: None (Preliminary result)   Collection Time: 02/15/16 10:18 PM  Result Value Ref Range Status   Specimen Description BLOOD LEFT ARM  Final   Special Requests BOTTLES DRAWN AEROBIC AND ANAEROBIC 6CC  Final   Culture NO GROWTH 2 DAYS  Final   Report Status PENDING  Incomplete  Culture, blood (Routine X 2) w Reflex to ID Panel     Status: None (Preliminary result)   Collection Time: 02/15/16 10:22 PM  Result Value Ref Range Status   Specimen Description BLOOD LEFT HAND  Final   Special Requests BOTTLES DRAWN AEROBIC AND ANAEROBIC 6CC  Final   Culture NO GROWTH 2 DAYS  Final   Report Status PENDING  Incomplete         Radiology Studies: Ct Abdomen Pelvis Wo Contrast  Result Date: 02/15/2016 CLINICAL DATA:  One-day history of vomiting. EXAM: CT  ABDOMEN AND PELVIS WITHOUT CONTRAST TECHNIQUE: Multidetector CT imaging of the abdomen and pelvis was performed following the standard protocol without IV contrast. COMPARISON:  None. FINDINGS: Lower chest: The lung bases are clear of acute process. Significant pectus deformity is noted with mass effect on the right ventricle. The distal esophagus is grossly normal. There is a small hiatal hernia. Hepatobiliary: No focal hepatic lesions or intrahepatic biliary dilatation. The gallbladder is surgically absent. Mild associated common bile duct dilatation. Pancreas: No mass, inflammation or ductal dilatation. Spleen: Normal size.  No focal lesions. Adrenals/Urinary Tract: The adrenal glands are unremarkable. Multiple bilateral renal calculi but no worrisome renal lesions. No ureteral or bladder calculi. The lower pelvis is somewhat obscured by artifact from a right hip prosthesis. Stomach/Bowel: The stomach,  duodenum, small bowel and colon are grossly normal. No inflammatory changes, mass lesions or obstructive findings. Contrast gets all the way to the distal colon and rectum. There is moderate sigmoid diverticulosis but no findings for acute diverticulitis. The terminal ileum is normal. The appendix is normal. Vascular/Lymphatic: Scattered atherosclerotic calcifications involving the abdominal aorta and iliac arteries. No aneurysm. No mesenteric or retroperitoneal mass or adenopathy. Reproductive: The uterus is surgically absent. Both ovaries are still present and appear normal. Dense calcification noted on the right. Other: No inguinal mass or adenopathy. No abdominal wall hernia or subcutaneous lesions. Musculoskeletal: No significant bony findings. IMPRESSION: No acute abdominal/pelvic findings, mass lesions or adenopathy. Status post cholecystectomy with mild associated common bile duct dilatation. Bilateral renal calculi but no obstructing ureteral calculi or bladder calculi. Sigmoid diverticulosis without  findings for acute diverticulitis. Electronically Signed   By: Marijo Sanes M.D.   On: 02/15/2016 17:32   Ct Soft Tissue Neck Wo Contrast  Result Date: 02/15/2016 CLINICAL DATA:  Difficulty swallowing and breathing.  Vomiting. EXAM: CT NECK WITHOUT CONTRAST TECHNIQUE: Multidetector CT imaging of the neck was performed following the standard protocol without intravenous contrast. COMPARISON:  None. FINDINGS: Pharynx and larynx: The nasopharynx is clear. The oropharynx and hypopharynx are normal. The epiglottis is normal. The supraglottic larynx, glottis and subglottic larynx are normal. No focal airway narrowing. No tonsillar hypertrophy. Salivary glands: Calcification in the area of the left submandibular duct, likely nonobstructing stone. Salivary glands are otherwise normal. Thyroid: Normal Lymph nodes: No enlarged or abnormal appearing cervical lymph nodes. Vascular: Atherosclerotic calcification within the aortic arch. Normal course and caliber the major cervical vessels. Limited intracranial: Unremarkable Visualized orbits: Normal Mastoids and visualized paranasal sinuses: Normal Skeleton: No bony spinal canal stenosis. Multilevel facet arthrosis. No lytic or blastic lesions. Grade 1 anterolisthesis at C4-C5. Upper chest: Biapical pulmonary scarring. No pleural effusion or pneumothorax. Other: None IMPRESSION: 1. Normal appearance of the pharynx and larynx without airway narrowing. 2. Nonobstructing 2 mm left submandibular duct sialolith. 3. Aortic atherosclerosis Electronically Signed   By: Ulyses Jarred M.D.   On: 02/15/2016 17:44   Ct Maxillofacial Wo Contrast  Result Date: 02/15/2016 CLINICAL DATA:  Right-sided facial pain. EXAM: CT MAXILLOFACIAL WITHOUT CONTRAST TECHNIQUE: Multidetector CT imaging of the maxillofacial structures was performed. Multiplanar CT image reconstructions were also generated. A small metallic BB was placed on the right temple in order to reliably differentiate right from  left. COMPARISON:  Neck CT same date. FINDINGS: Osseous: No acute facial bone fractures.  No worrisome bone lesions. Orbits: No orbital fractures. No intraorbital mass or hematoma. The optic nerves appear normal. Sinuses: Clear. Soft tissues: Fairly marked fatty atrophy of the right parotid gland with probable small intra parotid ductal calculi. There is a large, 6 mm calculus in the distal parotid duct (Stensen's duct) with marked inflammation involving the entire right side of the face and inflammation of the remaining parotid gland. Limited intracranial: Age related cerebral atrophy and ventriculomegaly. No acute intracranial findings or mass lesion. Other: As demonstrated on the neck CT there is a small calculus in the region of the left submandibular duct. No findings to suggest obstruction or sialadenitis. IMPRESSION: 1. 6 mm calculus in Stensen's duct on the right with marked inflammation of the anterior portion of the parotid gland and surrounding soft tissues. The main portion of the gland is atrophied and fatty. This could be due to chronic sialadenitis. 2. 2 mm calculus in the left submandibular duct but no findings  for obstruction or inflammation. Electronically Signed   By: Marijo Sanes M.D.   On: 02/15/2016 23:34        Scheduled Meds: . aspirin EC  81 mg Oral q morning - 10a  . aztreonam  1 g Intravenous Q8H  . clorazepate  7.5 mg Oral QHS  . famotidine  20 mg Oral QHS  . ketorolac  15 mg Intravenous Q6H  . levothyroxine  50 mcg Oral QAC breakfast  . potassium chloride  10 mEq Intravenous Once  . vancomycin  750 mg Intravenous Q24H   Continuous Infusions: . sodium chloride 100 mL/hr at 02/17/16 0518     LOS: 1 day    Time spent: 25 minutes    Kathie Dike, MD [facility] If 7PM-7AM, please contact night-coverage [email or web address] Password [email or web address] 02/17/2016, 9:09 AM   By signing my name below, I, Clerance Lav, attest that this documentation has been prepared under  the direction and in the presence of Kathie Dike, MD. Electronically signed: Clerance Lav, Scribe. 02/17/16 11:02 AM  I, Dr. Kathie Dike, personally performed the services described in this documentaiton. All medical record entries made by the scribe were at my direction and in my presence. I have reviewed the chart and agree that the record reflects my personal performance and is accurate and complete  Kathie Dike, MD, 02/17/2016 11:29 AM

## 2016-02-17 NOTE — Progress Notes (Signed)
Pt alert and oriented self, time and place.  Pt knows she is here because of her jaw.  She does not understand why she was not discharged.  Is upset because she thinks her family does not know she is here.  Provided reassurance that her family is aware that she is here in the hospital.  Pt requesting to talk to another nurse before I am able to give medications.  Debra Richards, charge nurse, in room to talk to patient.  Debra Richards provided reassurance.  Pt called Debra Richards, daughter, and spoke with her.  I also spoke with Debra Richards.  Debra Richards spoke with her son as did the pt.  Pt still having bouts of confusion and forgetfulness.  Pt requesting to talk to security because she's afraid that her family cannot get in to see her.  Security in room at this time providing reassurance to pt that we will let her family come in anytime to see her.

## 2016-02-18 LAB — CBC
HCT: 34.2 % — ABNORMAL LOW (ref 36.0–46.0)
HEMOGLOBIN: 11.5 g/dL — AB (ref 12.0–15.0)
MCH: 33.1 pg (ref 26.0–34.0)
MCHC: 33.6 g/dL (ref 30.0–36.0)
MCV: 98.6 fL (ref 78.0–100.0)
Platelets: 105 10*3/uL — ABNORMAL LOW (ref 150–400)
RBC: 3.47 MIL/uL — AB (ref 3.87–5.11)
RDW: 13.3 % (ref 11.5–15.5)
WBC: 5.7 10*3/uL (ref 4.0–10.5)

## 2016-02-18 LAB — BASIC METABOLIC PANEL
Anion gap: 6 (ref 5–15)
BUN: 5 mg/dL — AB (ref 6–20)
CALCIUM: 7.6 mg/dL — AB (ref 8.9–10.3)
CHLORIDE: 115 mmol/L — AB (ref 101–111)
CO2: 21 mmol/L — AB (ref 22–32)
CREATININE: 0.57 mg/dL (ref 0.44–1.00)
GFR calc non Af Amer: >60 mL/min (ref >60)
GLUCOSE: 99 mg/dL (ref 65–99)
Potassium: 3.7 mmol/L (ref 3.5–5.1)
Sodium: 142 mmol/L (ref 135–145)

## 2016-02-18 MED ORDER — CLINDAMYCIN PHOSPHATE 600 MG/50ML IV SOLN
INTRAVENOUS | Status: AC
  Filled 2016-02-18: qty 50

## 2016-02-18 MED ORDER — KETOROLAC TROMETHAMINE 15 MG/ML IJ SOLN
15.0000 mg | Freq: Four times a day (QID) | INTRAMUSCULAR | Status: DC
  Administered 2016-02-19 – 2016-02-20 (×7): 15 mg via INTRAVENOUS
  Filled 2016-02-18 (×7): qty 1

## 2016-02-18 MED ORDER — SODIUM CHLORIDE 0.9 % IV SOLN
INTRAVENOUS | Status: AC
  Filled 2016-02-18 (×2): qty 3

## 2016-02-18 MED ORDER — SODIUM CHLORIDE 0.9 % IV SOLN
3.0000 g | Freq: Four times a day (QID) | INTRAVENOUS | Status: DC
  Administered 2016-02-18 – 2016-02-20 (×7): 3 g via INTRAVENOUS
  Filled 2016-02-18 (×12): qty 3

## 2016-02-18 NOTE — Progress Notes (Signed)
PROGRESS NOTE    Debra Richards  [id number] DOB: May 15, 1926 DOA: 02/15/2016 PCP: Glenda Chroman, MD   Brief Narrative:  56 yof with a hx of afib, CAD, GERD, hypothyroidism, and RA presented with complaints of generalized weakness and right jaw swelling. Patient was recently seen by her ENT physician and started on clindamycin for suspected parotitis. When her symptoms became worse, she came to the ER for evaluation.  Assessment & Plan:   Active Problems:   Hypothyroidism   Thrombocytopenia (HCC)   Weakness   Acute parotitis   Atrial fibrillation (HCC)   Hypokalemia  1. Acute parotitis. WBC wnl. Pateint presented with an extremely tender right parotid gland. CT of maxillofacial revealed 6 mm calculus in Stensen's duct on the right with marked inflammation of the anterior portion of the parotid gland and surrounding soft tissues. Initially swelling and redness started to improve, but today she has had worsening of erythema, tenderness and swelling. Will change antibiotics to unasyn. Case discussed with Dr. Benjamine Mola who is familiar with patient. He did not feel that surgical management was necessary at this time. He recommended continued antibiotics and IV fluids. He will follow her up in the office after discharge. 2. Generalized weakness. Likely due to acute illness. She had a CK of 456 on admission. Appears to be improving since admission. Physical therapy evaluation. Continue to monitor.  3. Anemia. Hgb is slightly low. No evidence of bleed. Recheck CBC in the am. 4. Hx of Afib. Controlled at this time. Continue ASA.  5. Hypothyroidism. Continue synthroid.  6. Hypokalemia. Replaced.  7. HTN. Stable. Continue outpatient medications. 8. GERD. Continue PPI.  9. Hx of CAD.  10. Thrombocytopenia. Chronic. Continue to follow 11. Confusion. Likely related to advanced age and change in familiar environment. Continue to follow up.   DVT prophylaxis:  SCDs  Code Status: Full  Family  Communication: discussed with Daughter Barnett Applebaum Disposition Plan: Discharge home once improved   Consultants:   None  Procedures:   None  Antimicrobials:   Vancomycin 9/22 >> 9/23  Azactam 9/22 >> 9/23  Clindamycin 9/23 >>   Subjective: Reports worsening swelling, pain and erythema over right jaw  Objective: Vitals:   02/17/16 1429 02/17/16 2000 02/17/16 2150 02/18/16 0613  BP: (!) 106/42  (!) 120/56 (!) 115/45  Pulse: 69  70 74  Resp: 18  18 20   Temp: 97.7 F (36.5 C)     TempSrc: Oral  Oral   SpO2: 100% 99% 99% 98%  Weight:      Height:        Intake/Output Summary (Last 24 hours) at 02/18/16 0825 Last data filed at 02/18/16 0300  Gross per 24 hour  Intake             1140 ml  Output                0 ml  Net             1140 ml   Filed Weights   02/15/16 1232 02/15/16 2251  Weight: 58.1 kg (128 lb) 58.2 kg (128 lb 3.2 oz)    Examination:   General exam: Appears calm and comfortable  HEENT: erythema over right parotid worsening and tracking down neck, increased swelling and tenderness over right parotid Respiratory system: Clear to auscultation. Respiratory effort normal. Cardiovascular system: S1 & S2 heard, RRR. No JVD, murmurs, rubs, gallops or clicks. No pedal edema. Gastrointestinal system: Abdomen is nondistended, soft and nontender. No  organomegaly or masses felt. Normal bowel sounds heard. Central nervous system: Alert and oriented. No focal neurological deficits. Extremities: Symmetric 5 x 5 power. Skin: No rashes, lesions or ulcers Psychiatry: Judgement and insight appear normal. Mood & affect appropriate.     Data Reviewed: I have personally reviewed following labs and imaging studies  CBC:  Recent Labs Lab 02/15/16 1254 02/16/16 0632 02/18/16 0546  WBC 10.5 5.6 5.7  HGB 13.7 11.1* 11.5*  HCT 41.0 33.8* 34.2*  MCV 98.1 98.3 98.6  PLT 98* 90* [id number]*   Basic Metabolic Panel:  Recent Labs Lab 02/15/16 1254 02/16/16 0632  02/18/16 0546  NA 135 138 142  K 3.2* 3.9 3.7  CL 103 114* 115*  CO2 23 22 21*  GLUCOSE 134* 101* 99  BUN 15 9 5*  CREATININE 0.91 0.58 0.57  CALCIUM 8.5* 7.0* 7.6*   GFR: Estimated Creatinine Clearance: 39.6 mL/min (by C-G formula based on SCr of 0.57 mg/dL). Liver Function Tests:  Recent Labs Lab 02/16/16 0632  AST 32  ALT 18  ALKPHOS 43  BILITOT 0.5  PROT 4.9*  ALBUMIN 2.7*   No results for input(s): LIPASE, AMYLASE in the last 168 hours. No results for input(s): AMMONIA in the last 168 hours. Coagulation Profile: No results for input(s): INR, PROTIME in the last 168 hours. Cardiac Enzymes:  Recent Labs Lab 02/15/16 1254  CKTOTAL 456*   BNP (last 3 results) No results for input(s): PROBNP in the last 8760 hours. HbA1C: No results for input(s): HGBA1C in the last 72 hours. CBG: No results for input(s): GLUCAP in the last 168 hours. Lipid Profile: No results for input(s): CHOL, HDL, LDLCALC, TRIG, CHOLHDL, LDLDIRECT in the last 72 hours. Thyroid Function Tests: No results for input(s): TSH, T4TOTAL, FREET4, T3FREE, THYROIDAB in the last 72 hours. Anemia Panel: No results for input(s): VITAMINB12, FOLATE, FERRITIN, TIBC, IRON, RETICCTPCT in the last 72 hours. Sepsis Labs: No results for input(s): PROCALCITON, LATICACIDVEN in the last 168 hours.  Recent Results (from the past 240 hour(s))  Culture, blood (Routine X 2) w Reflex to ID Panel     Status: None (Preliminary result)   Collection Time: 02/15/16 10:18 PM  Result Value Ref Range Status   Specimen Description BLOOD LEFT ARM  Final   Special Requests BOTTLES DRAWN AEROBIC AND ANAEROBIC 6CC  Final   Culture NO GROWTH 2 DAYS  Final   Report Status PENDING  Incomplete  Culture, blood (Routine X 2) w Reflex to ID Panel     Status: None (Preliminary result)   Collection Time: 02/15/16 10:22 PM  Result Value Ref Range Status   Specimen Description BLOOD LEFT HAND  Final   Special Requests BOTTLES DRAWN  AEROBIC AND ANAEROBIC 6CC  Final   Culture NO GROWTH 2 DAYS  Final   Report Status PENDING  Incomplete     Radiology Studies: No results found.   Scheduled Meds: . aspirin EC  81 mg Oral q morning - 10a  . clindamycin (CLEOCIN) IV  600 mg Intravenous Q8H  . clorazepate  7.5 mg Oral QHS  . famotidine  20 mg Oral QHS  . ketorolac  15 mg Intravenous Q6H  . levothyroxine  50 mcg Oral QAC breakfast  . LORazepam  0.5 mg Oral QHS  . potassium chloride  10 mEq Intravenous Once   Continuous Infusions: . sodium chloride 100 mL/hr at 02/18/16 0216     LOS: 2 days   Time spent: 25 minutes  [employer],  MD [facility] If 7PM-7AM, please contact night-coverage [email or web address] Password [email or web address] 02/18/2016, 8:25 AM

## 2016-02-19 NOTE — Care Management Important Message (Signed)
Important Message  Patient Details  Name: Debra Richards MRN: [id number] Date of Birth: 1925/07/25   Medicare Important Message Given:  Yes    Cherina Dhillon, Chauncey Reading, RN 02/19/2016, 9:43 AM

## 2016-02-19 NOTE — Evaluation (Signed)
Physical Therapy Evaluation Patient Details Name: Debra Richards MRN: [id number] DOB: Nov 30, 1925 Today's Date: 02/19/2016   History of Present Illness  82 y.o. female, With history of atrial fibrillation on aspirin, , coronary artery disease, stable, hypothyroidism controlled on Synthroid, was brought to the hospital for generalized weakness which started last night.  As the patient she was seen at ENT clinic by Dr Benjamine Mola, and started on clindamycin for infected salivary gland. Patient took 1 tablet of clindamycin yesterday, and went to sleep. When she woke up at 2 AM to go to bathroom she felt extremely weak, slid onto the floor and was unable to get up. She was shivering and legs were cold. Patient lay on floor till 10 AM. Patient's son got home and helped her get up.  Patient had one episode of vomiting at doctor's office. She also had mild shortness of breath.  CT of neck done in the ED showed 2 mm stone in the left submandibular gland.  Dx: Acute parotitis. WBC wnl. Pateint presented with an extremely tender right parotid gland. CT of maxillofacial revealed 6 mm calculus in Stensen's duct on the right with marked inflammation of the anterior portion of the parotid gland and surrounding soft tissues. Initially swelling and redness started to improve, but today she has had worsening of erythema, tenderness and swelling.  PMH: Afib, CAD, edema, hiatal hernia, kidney stones, hypothyroidism, LBBB, MVP, Osteoporosis, RA, R ureteral stone, R THA, R knee arthroscopy, R TKA.      Clinical Impression  Pt received in bed, asleep, but easily awakened.  Pt expressed that she lives alone, but she has children that stop in to check on her, and assist when needed, but she states that she still gets out and drives somewhere every day.  Pt states that she only uses the RW occasionally, and that she frequently goes to OPPT in [place] [employer]?).  Pt seems to continue to have confusion, and believes she is  currently at her house despite my efforts to re-orient her to the hospital.  Pt requires Min guard for all functional mobility, and was able to ambulate 134ft with RW with 1 LOB requiring up to [place] A to correct.  She had great difficulty finding her way back to her room.  She requires assistance for recall of room number even directly after it was given to her.  (Ie: PT states her room number is 315, and pt states, "Ok, 336").  Pt aslo requires cues to find where the room numbers are located on the side of the doors.  It seems that pt's mobility is near baseline, however her cognitive status is not.  Due to this change in cognitive status, she is not safe to d/c home alone, and she is recommended to have 24/7 supervision/assistance vs ALF.      Follow Up Recommendations No PT follow up;Supervision/Assistance - 24 hour (ALF)    Equipment Recommendations  None recommended by PT    Recommendations for Other Services       Precautions / Restrictions Precautions Precautions: Fall Precaution Comments: reason for admission.  Restrictions Weight Bearing Restrictions: No      Mobility  Bed Mobility Overal bed mobility: Needs Assistance Bed Mobility: Supine to Sit     Supine to sit: HOB elevated;Supervision        Transfers Overall transfer level: Needs assistance Equipment used: Rolling walker (2 wheeled) Transfers: Sit to/from Stand Sit to Stand: Min guard (Pt pulls on RW instead of pushing up  from the bed. )            Ambulation/Gait Ambulation/Gait assistance: Min guard Ambulation Distance (Feet): 120 Feet Assistive device: Rolling walker (2 wheeled) Gait Pattern/deviations: Step-through pattern   Gait velocity interpretation: <1.8 ft/sec, indicative of risk for recurrent falls General Gait Details: pt had 1 episode of LOB where she required Min A to prevent falling back and to the right.  Pt requires increased assistance to find her way back to the room.  PT expressed to  her on multiple occasions that she was in room 315, however she would repeat 338. She also required assistance for where to look to find the room numbers.  Pt continually telling PT to answer the phone (re: she could hear the phone ring at the nurses station down the hall, but she thought it was her phone ringing).   Stairs            Wheelchair Mobility    Modified Rankin (Stroke Patients Only)       Balance Overall balance assessment: Needs assistance         Standing balance support: Bilateral upper extremity supported Standing balance-Leahy Scale: Fair                               Pertinent Vitals/Pain Pain Assessment: 0-10 Pain Score: 4  Pain Location: R hip - not able to describe.   Pain Intervention(s): Limited activity within patient's tolerance;Repositioned;Monitored during session    Home Living   Living Arrangements: Alone (Dtr is in an out, but she work - dtr is a Marine scientist.  Husband lives with his dtr due to alzheimers. ) Available Help at Discharge: Family (son and dtr come to stay occasionally.  ) Type of Home: House Home Access: Stairs to enter Entrance Stairs-Rails: Right Entrance Stairs-Number of Steps: 3 Home Layout: Able to live on main level with bedroom/bathroom;Two level Home Equipment: Walker - 2 wheels;Cane - single point      Prior Function Level of Independence: Independent with assistive device(s)   Gait / Transfers Assistance Needed: Pt states that she doesn't have to use DME much, however for the last week she was using RW occasionally.    ADL's / Homemaking Assistance Needed: Pt's children does the cooking/cleaning.  Son takes her to lunch every day.  Pt states that she is independent with dressing/bathing. Pt states she is still driving.  Dtr and pt go together for grocery shopping.          Hand Dominance   Dominant Hand: Right    Extremity/Trunk Assessment   Upper Extremity Assessment: Overall WFL for tasks  assessed           Lower Extremity Assessment: Generalized weakness         Communication   Communication: No difficulties  Cognition Arousal/Alertness: Awake/alert Behavior During Therapy: WFL for tasks assessed/performed Overall Cognitive Status: Impaired/Different from baseline Area of Impairment: Orientation Orientation Level: Place;Time (Pt states she is at her house and tells me her address, )   Memory: Decreased short-term memory              General Comments      Exercises     Assessment/Plan    PT Assessment Patient needs continued PT services (while in acute care, however doubt she will need any f/u PT )  PT Problem List Decreased activity tolerance;Decreased balance;Decreased mobility;Decreased cognition  PT Treatment Interventions DME instruction;Gait training;Functional mobility training;Therapeutic activities;Balance training;Cognitive remediation;Patient/family education;Stair training;Therapeutic exercise    PT Goals (Current goals can be found in the Care Plan section)  Acute Rehab PT Goals PT Goal Formulation: Patient unable to participate in goal setting Time For Goal Achievement: 02/26/16 Potential to Achieve Goals: Good    Frequency Min 2X/week   Barriers to discharge Decreased caregiver support      Co-evaluation               End of Session Equipment Utilized During Treatment: Gait belt Activity Tolerance: Patient tolerated treatment well Patient left: in bed;with bed alarm set      Functional Assessment Tool Used: [employer] "6-clicks"  Functional Limitation: Mobility: Walking and moving around Mobility: Walking and Moving Around Current Status [phone]): At least 40 percent but less than 60 percent impaired, limited or restricted Mobility: Walking and Moving Around Goal Status [phone]): At least 20 percent but less than 40 percent impaired, limited or restricted    Time: [id number] PT Time  Calculation (min) (ACUTE ONLY): 31 min   Charges:   PT Evaluation $PT Eval Low Complexity: 1 Procedure PT Treatments $Gait Training: 8-22 mins   PT G Codes:   PT G-Codes **NOT FOR INPATIENT CLASS** Functional Assessment Tool Used: [employer] "6-clicks"  Functional Limitation: Mobility: Walking and moving around Mobility: Walking and Moving Around Current Status [phone]): At least 40 percent but less than 60 percent impaired, limited or restricted Mobility: Walking and Moving Around Goal Status [phone]): At least 20 percent but less than 40 percent impaired, limited or restricted    Beth Rosario Duey, PT, DPT X: [phone]

## 2016-02-19 NOTE — Clinical Social Work Note (Signed)
CSW spoke with pt's daughter regarding d/c plan. PT recommending ALF or 24/7 supervision. Pt's daughter, Debra Richards states that pt is generally oriented x4 and still drives. She is very confused in hospital and lives alone. CSW offered ALF placement and Debra Richards reports that they will not consider placement. She has arranged for people to stay with pt around the clock once she returns home until she is back at baseline. CSW signing off.   Debra Richards, [place]

## 2016-02-19 NOTE — Progress Notes (Signed)
PROGRESS NOTE    Debra Richards  [id number] DOB: 1926/03/21 DOA: 02/15/2016 PCP: Glenda Chroman, MD    Brief Narrative:  29 yof with a hx of afib, CAD, GERD, hypothyroidism, and RA presented with complaints of generalized weakness and right jaw swelling. Patient was recently seen by her ENT physician and started on clindamycin for suspected parotitis. When her symptoms became worse, she came to the ER for evaluation.  Assessment & Plan:   Active Problems:   Hypothyroidism   Thrombocytopenia (HCC)   Weakness   Acute parotitis   Atrial fibrillation (HCC)   Hypokalemia  1. Acute parotitis. WBC wnl. Pateint presented with an extremely tender right parotid gland. CT of maxillofacial revealed 6 mm calculus in Stensen's duct on the right with marked inflammation of the anterior portion of the parotid gland and surrounding soft tissues. Initially swelling and redness started to improve, but then began developing worsening of erythema, tenderness and swelling. Antibiotics were changed to unasyn on 9/24. Case discussed with Dr. Benjamine Mola on 9/24 who is familiar with patient. He did not feel that surgical management was necessary at this time. He recommended continued antibiotics and IV fluids. Follow-up with Dr. Benjamine Mola after discharge. At this point, erythema and swelling appear relatively unchanged from yesterday. Would continue current treatments and re-evaluate in AM 2. Generalized weakness. Likely due to acute illness. She had a CK of 456 on admission. Appears to be improving since admission. She is now able to ambulate without difficulty.  3. Anemia. Hgb is slightly low. No evidence of bleed. Recheck CBC in the am. 4. Hx of Afib. Controlled at this time. Continue ASA.  5. Hypothyroidism. Continue synthroid.  6. Hypokalemia. Replaced.  7. HTN. Stable. Continue outpatient medications. 8. GERD. Continue PPI.  9. Hx of CAD.  10. Thrombocytopenia. Chronic. Continue to follow 11. Confusion.  Likely related to advanced age and change in familiar environment. Continue to follow up.  DVT prophylaxis: SCDs  Code Status: Full  Family Communication: Discussed with daughter Barnett Applebaum over th ephone Disposition Plan: Discharge home once improved.    Consultants:   None   Procedures:   None   Antimicrobials:   Vancomycin 9/22 >> 9/23   Azactam 9/22 > 9/23  Clindamycin 9/23 >>    Subjective: Continues to have pain and swelling in the right parotid area  Objective: Vitals:   02/17/16 2150 02/18/16 0613 02/18/16 1932 02/19/16 0439  BP: (!) 120/56 (!) 115/45 (!) 113/55 (!) 122/49  Pulse: 70 74 69 78  Resp: 18 20 18 18   Temp:   99 F (37.2 C) 98.6 F (37 C)  TempSrc: Oral  Oral Oral  SpO2: 99% 98% 100% 99%  Weight:      Height:        Intake/Output Summary (Last 24 hours) at 02/19/16 0710 Last data filed at 02/19/16 0300  Gross per 24 hour  Intake              900 ml  Output                0 ml  Net              900 ml   Filed Weights   02/15/16 1232 02/15/16 2251  Weight: 58.1 kg (128 lb) 58.2 kg (128 lb 3.2 oz)    Examination:  General exam: Appears calm and comfortable  HEENT: persistent erythema and swelling with tenderness over the right parotid. Erythema appears unchanged from yesterday. Respiratory  system: Clear to auscultation. Respiratory effort normal. Cardiovascular system: S1 & S2 heard, RRR. No JVD, murmurs, rubs, gallops or clicks. No pedal edema. Gastrointestinal system: Abdomen is nondistended, soft and nontender. No organomegaly or masses felt. Normal bowel sounds heard. Central nervous system: Alert and oriented. No focal neurological deficits. Extremities: Symmetric 5 x 5 power. Skin: No rashes, lesions or ulcers Psychiatry: Judgement and insight appear normal. Mood & affect appropriate.     Data Reviewed: I have personally reviewed following labs and imaging studies  CBC:  Recent Labs Lab 02/15/16 1254 02/16/16 0632  02/18/16 0546  WBC 10.5 5.6 5.7  HGB 13.7 11.1* 11.5*  HCT 41.0 33.8* 34.2*  MCV 98.1 98.3 98.6  PLT 98* 90* [id number]*   Basic Metabolic Panel:  Recent Labs Lab 02/15/16 1254 02/16/16 0632 02/18/16 0546  NA 135 138 142  K 3.2* 3.9 3.7  CL 103 114* 115*  CO2 23 22 21*  GLUCOSE 134* 101* 99  BUN 15 9 5*  CREATININE 0.91 0.58 0.57  CALCIUM 8.5* 7.0* 7.6*   GFR: Estimated Creatinine Clearance: 39.6 mL/min (by C-G formula based on SCr of 0.57 mg/dL). Liver Function Tests:  Recent Labs Lab 02/16/16 0632  AST 32  ALT 18  ALKPHOS 43  BILITOT 0.5  PROT 4.9*  ALBUMIN 2.7*   No results for input(s): LIPASE, AMYLASE in the last 168 hours. No results for input(s): AMMONIA in the last 168 hours. Coagulation Profile: No results for input(s): INR, PROTIME in the last 168 hours. Cardiac Enzymes:  Recent Labs Lab 02/15/16 1254  CKTOTAL 456*   BNP (last 3 results) No results for input(s): PROBNP in the last 8760 hours. HbA1C: No results for input(s): HGBA1C in the last 72 hours. CBG: No results for input(s): GLUCAP in the last 168 hours. Lipid Profile: No results for input(s): CHOL, HDL, LDLCALC, TRIG, CHOLHDL, LDLDIRECT in the last 72 hours. Thyroid Function Tests: No results for input(s): TSH, T4TOTAL, FREET4, T3FREE, THYROIDAB in the last 72 hours. Anemia Panel: No results for input(s): VITAMINB12, FOLATE, FERRITIN, TIBC, IRON, RETICCTPCT in the last 72 hours. Sepsis Labs: No results for input(s): PROCALCITON, LATICACIDVEN in the last 168 hours.  Recent Results (from the past 240 hour(s))  Culture, blood (Routine X 2) w Reflex to ID Panel     Status: None (Preliminary result)   Collection Time: 02/15/16 10:18 PM  Result Value Ref Range Status   Specimen Description BLOOD LEFT ARM  Final   Special Requests BOTTLES DRAWN AEROBIC AND ANAEROBIC 6CC  Final   Culture NO GROWTH 3 DAYS  Final   Report Status PENDING  Incomplete  Culture, blood (Routine X 2) w Reflex to ID  Panel     Status: None (Preliminary result)   Collection Time: 02/15/16 10:22 PM  Result Value Ref Range Status   Specimen Description BLOOD LEFT HAND  Final   Special Requests BOTTLES DRAWN AEROBIC AND ANAEROBIC 6CC  Final   Culture NO GROWTH 3 DAYS  Final   Report Status PENDING  Incomplete         Radiology Studies: No results found.      Scheduled Meds: . ampicillin-sulbactam (UNASYN) IV  3 g Intravenous Q6H  . aspirin EC  81 mg Oral q morning - 10a  . clorazepate  7.5 mg Oral QHS  . famotidine  20 mg Oral QHS  . ketorolac  15 mg Intravenous Q6H  . levothyroxine  50 mcg Oral QAC breakfast  . LORazepam  0.5  mg Oral QHS  . potassium chloride  10 mEq Intravenous Once   Continuous Infusions: . sodium chloride 100 mL/hr at 02/19/16 0008     LOS: 3 days    Time spent: 25 minutes     Kathie Dike, MD [facility] If 7PM-7AM, please contact night-coverage [email or web address] Password [email or web address] 02/19/2016, 7:10 AM

## 2016-02-20 LAB — CULTURE, BLOOD (ROUTINE X 2)
CULTURE: NO GROWTH
Culture: NO GROWTH

## 2016-02-20 LAB — CBC
HEMATOCRIT: 33.5 % — AB (ref 36.0–46.0)
Hemoglobin: 11.1 g/dL — ABNORMAL LOW (ref 12.0–15.0)
MCH: 32.2 pg (ref 26.0–34.0)
MCHC: 33.1 g/dL (ref 30.0–36.0)
MCV: 97.1 fL (ref 78.0–100.0)
PLATELETS: 123 10*3/uL — AB (ref 150–400)
RBC: 3.45 MIL/uL — ABNORMAL LOW (ref 3.87–5.11)
RDW: 13.9 % (ref 11.5–15.5)
WBC: 5.6 10*3/uL (ref 4.0–10.5)

## 2016-02-20 LAB — BASIC METABOLIC PANEL
ANION GAP: 5 (ref 5–15)
BUN: <5 mg/dL — ABNORMAL LOW (ref 6–20)
CALCIUM: 7.1 mg/dL — AB (ref 8.9–10.3)
CO2: 22 mmol/L (ref 22–32)
Chloride: 114 mmol/L — ABNORMAL HIGH (ref 101–111)
Creatinine, Ser: 0.5 mg/dL (ref 0.44–1.00)
Glucose, Bld: 93 mg/dL (ref 65–99)
Potassium: 3 mmol/L — ABNORMAL LOW (ref 3.5–5.1)
SODIUM: 141 mmol/L (ref 135–145)

## 2016-02-20 MED ORDER — AMOXICILLIN-POT CLAVULANATE 875-125 MG PO TABS: 1.0000 | ORAL_TABLET | Freq: Two times a day (BID) | ORAL | 0 refills | Status: DC

## 2016-02-20 MED ORDER — POTASSIUM CHLORIDE CRYS ER 20 MEQ PO TBCR
40.0000 meq | EXTENDED_RELEASE_TABLET | ORAL | Status: DC
  Administered 2016-02-20 (×2): 40 meq via ORAL
  Filled 2016-02-20 (×2): qty 2

## 2016-02-20 NOTE — Progress Notes (Signed)
Patient with orders to be discharge home. Discharge instructions given to son, verbalized understanding. Patient stable. Patient left in private vehicle with son.

## 2016-02-20 NOTE — Discharge Summary (Signed)
Physician Discharge Summary  Debra Richards [id number] DOB: 26-Dec-1925 DOA: 02/15/2016  PCP: Glenda Chroman, MD  Admit date: 02/15/2016 Discharge date: 02/20/2016  Admitted From: home Disposition:  home  Recommendations for Outpatient Follow-up:  1. Follow up with PCP in 1-2 weeks 2. Follow up with ENT, Dr. Benjamine Mola in 1 week  Home Health: Equipment/Devices:  Discharge Condition: stable CODE STATUS: full code Diet recommendation: heart healthy  Brief/Interim Summary: 52 yof with a hx of afib, CAD, GERD, hypothyroidism, and RA presented with complaints of generalized weakness and right jaw swelling. Patient was recently seen by her ENT physician and started on clindamycin for suspected parotitis. When her symptoms became worse, she came to the ER for evaluation.  Discharge Diagnoses:  Active Problems:   Hypothyroidism   Thrombocytopenia (HCC)   Weakness   Acute parotitis   Atrial fibrillation (HCC)   Hypokalemia  1. Acute parotitis. WBC wnl. Pateint presented with an extremely tender right parotid gland. CT of maxillofacial revealed 6 mm calculus in Stensen's duct on the right with marked inflammation of the anterior portion of the parotid gland and surrounding soft tissues. Initially swelling and redness started to improve, but then began developing worsening of erythema, tenderness and swelling. Antibiotics were changed to unasyn on 9/24. Case discussed with Dr. Benjamine Mola on 9/24 who is familiar with patient. He did not feel that surgical management was necessary at this time. He recommended continued antibiotics and IV fluids. Follow-up with Dr. Benjamine Mola after discharge. With continued IV antibiotic therapy, erythema resolved. She continues to have some swelling and tenderness, but this is anticipated to improve in the next few days. 2. Generalized weakness. Likely due to acute illness. Appears to be improving since admission. She is now able to ambulate without difficulty.  3. Anemia.  Hgb stable. No evidence of bleed.  4. Hx of Afib. Controlled at this time. Continue ASA.  5. Hypothyroidism. Continue synthroid.  6. Hypokalemia. Replaced.  7. HTN. Stable. Continue outpatient medications. 8. GERD. Continue PPI.  9. Hx of CAD.  10. Thrombocytopenia. Chronic. Continue to follow 11. Confusion. Likely related to advanced age and change in familiar environment. He displayed that this should improve when she returns to her familiar environment.  Discharge Instructions  Discharge Instructions    Diet - low sodium heart healthy    Complete by:  As directed    Increase activity slowly    Complete by:  As directed        Medication List    STOP taking these medications   clindamycin 150 MG capsule Commonly known as:  CLEOCIN     TAKE these medications   amoxicillin-clavulanate 875-125 MG tablet Commonly known as:  AUGMENTIN Take 1 tablet by mouth 2 (two) times daily.   aspirin EC 81 MG tablet Take 81 mg by mouth every morning.   cholecalciferol 1000 units tablet Commonly known as:  VITAMIN D Take 1,000 Units by mouth every morning.   clorazepate 7.5 MG tablet Commonly known as:  TRANXENE Take 7.5 mg by mouth at bedtime.   denosumab 60 MG/ML Soln injection Commonly known as:  PROLIA Inject 60 mg into the skin every 6 (six) months. Administer in upper arm, thigh, or abdomen   levothyroxine 50 MCG tablet Commonly known as:  SYNTHROID, LEVOTHROID TAKE 1 TABLET DAILY BEFORE BREAKFAST.   meclizine 12.5 MG tablet Commonly known as:  ANTIVERT Take 12.5 mg by mouth daily as needed for dizziness. Reported on 07/24/2015   ranitidine 150 MG  tablet Commonly known as:  ZANTAC Take 75 mg by mouth at bedtime.   SOOTHE 0.6-0.6 % Soln Generic drug:  Propylene Glycol-Glycerin Apply 1 drop to eye daily as needed (for eye irritation/dry eye).   vitamin E 400 UNIT capsule Take 400 Units by mouth every morning.      Follow-up Information    DR SU WOOI TEOH.  Schedule an appointment as soon as possible for a visit in 1 week(s).   Contact information: [address] [place] [id number]         Allergies  Allergen Reactions  . Contrast Media [Iodinated Diagnostic Agents]     Arm swelled up and was painful after IVP years ago/   . Levaquin [Levofloxacin In D5w]     Made her sick all over  . Penicillins Hives    03/04/13: Pt has tolerated Amoxicillin/Unsyn without reaction  . Ciprofloxacin Rash    Consultations:     Procedures/Studies: Ct Abdomen Pelvis Wo Contrast  Result Date: 02/15/2016 CLINICAL DATA:  One-day history of vomiting. EXAM: CT ABDOMEN AND PELVIS WITHOUT CONTRAST TECHNIQUE: Multidetector CT imaging of the abdomen and pelvis was performed following the standard protocol without IV contrast. COMPARISON:  None. FINDINGS: Lower chest: The lung bases are clear of acute process. Significant pectus deformity is noted with mass effect on the right ventricle. The distal esophagus is grossly normal. There is a small hiatal hernia. Hepatobiliary: No focal hepatic lesions or intrahepatic biliary dilatation. The gallbladder is surgically absent. Mild associated common bile duct dilatation. Pancreas: No mass, inflammation or ductal dilatation. Spleen: Normal size.  No focal lesions. Adrenals/Urinary Tract: The adrenal glands are unremarkable. Multiple bilateral renal calculi but no worrisome renal lesions. No ureteral or bladder calculi. The lower pelvis is somewhat obscured by artifact from a right hip prosthesis. Stomach/Bowel: The stomach, duodenum, small bowel and colon are grossly normal. No inflammatory changes, mass lesions or obstructive findings. Contrast gets all the way to the distal colon and rectum. There is moderate sigmoid diverticulosis but no findings for acute diverticulitis. The terminal ileum is normal. The appendix is normal. Vascular/Lymphatic: Scattered atherosclerotic calcifications involving the  abdominal aorta and iliac arteries. No aneurysm. No mesenteric or retroperitoneal mass or adenopathy. Reproductive: The uterus is surgically absent. Both ovaries are still present and appear normal. Dense calcification noted on the right. Other: No inguinal mass or adenopathy. No abdominal wall hernia or subcutaneous lesions. Musculoskeletal: No significant bony findings. IMPRESSION: No acute abdominal/pelvic findings, mass lesions or adenopathy. Status post cholecystectomy with mild associated common bile duct dilatation. Bilateral renal calculi but no obstructing ureteral calculi or bladder calculi. Sigmoid diverticulosis without findings for acute diverticulitis. Electronically Signed   By: Marijo Sanes M.D.   On: 02/15/2016 17:32   Ct Soft Tissue Neck Wo Contrast  Result Date: 02/15/2016 CLINICAL DATA:  Difficulty swallowing and breathing.  Vomiting. EXAM: CT NECK WITHOUT CONTRAST TECHNIQUE: Multidetector CT imaging of the neck was performed following the standard protocol without intravenous contrast. COMPARISON:  None. FINDINGS: Pharynx and larynx: The nasopharynx is clear. The oropharynx and hypopharynx are normal. The epiglottis is normal. The supraglottic larynx, glottis and subglottic larynx are normal. No focal airway narrowing. No tonsillar hypertrophy. Salivary glands: Calcification in the area of the left submandibular duct, likely nonobstructing stone. Salivary glands are otherwise normal. Thyroid: Normal Lymph nodes: No enlarged or abnormal appearing cervical lymph nodes. Vascular: Atherosclerotic calcification within the aortic arch. Normal course and caliber the major cervical vessels.  Limited intracranial: Unremarkable Visualized orbits: Normal Mastoids and visualized paranasal sinuses: Normal Skeleton: No bony spinal canal stenosis. Multilevel facet arthrosis. No lytic or blastic lesions. Grade 1 anterolisthesis at C4-C5. Upper chest: Biapical pulmonary scarring. No pleural effusion or  pneumothorax. Other: None IMPRESSION: 1. Normal appearance of the pharynx and larynx without airway narrowing. 2. Nonobstructing 2 mm left submandibular duct sialolith. 3. Aortic atherosclerosis Electronically Signed   By: Ulyses Jarred M.D.   On: 02/15/2016 17:44   Ct Maxillofacial Wo Contrast  Result Date: 02/15/2016 CLINICAL DATA:  Right-sided facial pain. EXAM: CT MAXILLOFACIAL WITHOUT CONTRAST TECHNIQUE: Multidetector CT imaging of the maxillofacial structures was performed. Multiplanar CT image reconstructions were also generated. A small metallic BB was placed on the right temple in order to reliably differentiate right from left. COMPARISON:  Neck CT same date. FINDINGS: Osseous: No acute facial bone fractures.  No worrisome bone lesions. Orbits: No orbital fractures. No intraorbital mass or hematoma. The optic nerves appear normal. Sinuses: Clear. Soft tissues: Fairly marked fatty atrophy of the right parotid gland with probable small intra parotid ductal calculi. There is a large, 6 mm calculus in the distal parotid duct (Stensen's duct) with marked inflammation involving the entire right side of the face and inflammation of the remaining parotid gland. Limited intracranial: Age related cerebral atrophy and ventriculomegaly. No acute intracranial findings or mass lesion. Other: As demonstrated on the neck CT there is a small calculus in the region of the left submandibular duct. No findings to suggest obstruction or sialadenitis. IMPRESSION: 1. 6 mm calculus in Stensen's duct on the right with marked inflammation of the anterior portion of the parotid gland and surrounding soft tissues. The main portion of the gland is atrophied and fatty. This could be due to chronic sialadenitis. 2. 2 mm calculus in the left submandibular duct but no findings for obstruction or inflammation. Electronically Signed   By: Marijo Sanes M.D.   On: 02/15/2016 23:34       Subjective: Reports improvement in right jaw  pain and swelling.  Discharge Exam: Vitals:   02/20/16 0532 02/20/16 1300  BP: (!) 131/55 (!) 132/51  Pulse: 73 72  Resp: 18 18  Temp: 98 F (36.7 C) 98 F (36.7 C)   Vitals:   02/19/16 2014 02/19/16 2116 02/20/16 0532 02/20/16 1300  BP:  (!) 130/56 (!) 131/55 (!) 132/51  Pulse: 70 73 73 72  Resp: 18 18 18 18   Temp:  98.3 F (36.8 C) 98 F (36.7 C) 98 F (36.7 C)  TempSrc:  Oral Oral Oral  SpO2: 97% 98% 97% 100%  Weight:      Height:        General: Pt is alert, awake, not in acute distress HEENT:Erythema over right parotid has resolved, there is still some persistent swelling and tenderness, but this appears to be improving. Cardiovascular: RRR, S1/S2 +, no rubs, no gallops Respiratory: CTA bilaterally, no wheezing, no rhonchi Abdominal: Soft, NT, ND, bowel sounds + Extremities: no edema, no cyanosis    The results of significant diagnostics from this hospitalization (including imaging, microbiology, ancillary and laboratory) are listed below for reference.     Microbiology: Recent Results (from the past 240 hour(s))  Culture, blood (Routine X 2) w Reflex to ID Panel     Status: None   Collection Time: 02/15/16 10:18 PM  Result Value Ref Range Status   Specimen Description BLOOD LEFT ARM  Final   Special Requests BOTTLES DRAWN AEROBIC AND ANAEROBIC  [place]  Final   Culture NO GROWTH 5 DAYS  Final   Report Status 02/20/2016 FINAL  Final  Culture, blood (Routine X 2) w Reflex to ID Panel     Status: None   Collection Time: 02/15/16 10:22 PM  Result Value Ref Range Status   Specimen Description BLOOD LEFT HAND  Final   Special Requests BOTTLES DRAWN AEROBIC AND ANAEROBIC [place]  Final   Culture NO GROWTH 5 DAYS  Final   Report Status 02/20/2016 FINAL  Final     Labs: BNP (last 3 results) No results for input(s): BNP in the last 8760 hours. Basic Metabolic Panel:  Recent Labs Lab 02/15/16 1254 02/16/16 0632 02/18/16 0546 02/20/16 0546  NA 135 138 142 141  K  3.2* 3.9 3.7 3.0*  CL 103 114* 115* 114*  CO2 23 22 21* 22  GLUCOSE 134* 101* 99 93  BUN 15 9 5* <5*  CREATININE 0.91 0.58 0.57 0.50  CALCIUM 8.5* 7.0* 7.6* 7.1*   Liver Function Tests:  Recent Labs Lab 02/16/16 0632  AST 32  ALT 18  ALKPHOS 43  BILITOT 0.5  PROT 4.9*  ALBUMIN 2.7*   No results for input(s): LIPASE, AMYLASE in the last 168 hours. No results for input(s): AMMONIA in the last 168 hours. CBC:  Recent Labs Lab 02/15/16 1254 02/16/16 0632 02/18/16 0546 02/20/16 0546  WBC 10.5 5.6 5.7 5.6  HGB 13.7 11.1* 11.5* 11.1*  HCT 41.0 33.8* 34.2* 33.5*  MCV 98.1 98.3 98.6 97.1  PLT 98* 90* 105* 123*   Cardiac Enzymes:  Recent Labs Lab 02/15/16 1254  CKTOTAL 456*   BNP: Invalid input(s): POCBNP CBG: No results for input(s): GLUCAP in the last 168 hours. D-Dimer No results for input(s): DDIMER in the last 72 hours. Hgb A1c No results for input(s): HGBA1C in the last 72 hours. Lipid Profile No results for input(s): CHOL, HDL, LDLCALC, TRIG, CHOLHDL, LDLDIRECT in the last 72 hours. Thyroid function studies No results for input(s): TSH, T4TOTAL, T3FREE, THYROIDAB in the last 72 hours.  Invalid input(s): FREET3 Anemia work up No results for input(s): VITAMINB12, FOLATE, FERRITIN, TIBC, IRON, RETICCTPCT in the last 72 hours. Urinalysis    Component Value Date/Time   COLORURINE YELLOW 02/15/2016 [place] 02/15/2016 1237   LABSPEC <1.005 (L) 02/15/2016 1237   PHURINE 5.5 02/15/2016 1237   GLUCOSEU NEGATIVE 02/15/2016 1237   HGBUR NEGATIVE 02/15/2016 1237   BILIRUBINUR NEGATIVE 02/15/2016 1237   KETONESUR NEGATIVE 02/15/2016 1237   PROTEINUR NEGATIVE 02/15/2016 1237   UROBILINOGEN 1.0 01/01/2015 2140   NITRITE NEGATIVE 02/15/2016 1237   LEUKOCYTESUR TRACE (A) 02/15/2016 1237   Sepsis Labs Invalid input(s): PROCALCITONIN,  WBC,  LACTICIDVEN Microbiology Recent Results (from the past 240 hour(s))  Culture, blood (Routine X 2) w  Reflex to ID Panel     Status: None   Collection Time: 02/15/16 10:18 PM  Result Value Ref Range Status   Specimen Description BLOOD LEFT ARM  Final   Special Requests BOTTLES DRAWN AEROBIC AND ANAEROBIC 6CC  Final   Culture NO GROWTH 5 DAYS  Final   Report Status 02/20/2016 FINAL  Final  Culture, blood (Routine X 2) w Reflex to ID Panel     Status: None   Collection Time: 02/15/16 10:22 PM  Result Value Ref Range Status   Specimen Description BLOOD LEFT HAND  Final   Special Requests BOTTLES DRAWN AEROBIC AND ANAEROBIC [place]  Final   Culture NO GROWTH 5  DAYS  Final   Report Status 02/20/2016 FINAL  Final     Time coordinating discharge: Over 30 minutes  SIGNED:   Kathie Dike, MD  [facility] 02/20/2016, 7:48 PM Pager   If 7PM-7AM, please contact night-coverage [email or web address] Password [email or web address]

## 2016-02-26 ENCOUNTER — Ambulatory Visit (INDEPENDENT_AMBULATORY_CARE_PROVIDER_SITE_OTHER): Payer: Medicare Other | Admitting: Otolaryngology

## 2016-02-26 DIAGNOSIS — K115 Sialolithiasis: Secondary | ICD-10-CM

## 2016-02-26 DIAGNOSIS — K1121 Acute sialoadenitis: Secondary | ICD-10-CM | POA: Diagnosis not present

## 2016-03-05 ENCOUNTER — Ambulatory Visit (INDEPENDENT_AMBULATORY_CARE_PROVIDER_SITE_OTHER): Payer: Medicare Other | Admitting: Cardiovascular Disease

## 2016-03-05 ENCOUNTER — Encounter: Payer: Self-pay | Admitting: Cardiovascular Disease

## 2016-03-05 VITALS — BP 122/80 | HR 56 | Ht 65.0 in | Wt 125.0 lb

## 2016-03-05 DIAGNOSIS — I341 Nonrheumatic mitral (valve) prolapse: Secondary | ICD-10-CM | POA: Diagnosis not present

## 2016-03-05 DIAGNOSIS — I2510 Atherosclerotic heart disease of native coronary artery without angina pectoris: Secondary | ICD-10-CM

## 2016-03-05 DIAGNOSIS — K1120 Sialoadenitis, unspecified: Secondary | ICD-10-CM | POA: Diagnosis not present

## 2016-03-05 DIAGNOSIS — I447 Left bundle-branch block, unspecified: Secondary | ICD-10-CM | POA: Diagnosis not present

## 2016-03-05 DIAGNOSIS — R0789 Other chest pain: Secondary | ICD-10-CM

## 2016-03-05 DIAGNOSIS — E876 Hypokalemia: Secondary | ICD-10-CM

## 2016-03-05 DIAGNOSIS — E039 Hypothyroidism, unspecified: Secondary | ICD-10-CM

## 2016-03-05 NOTE — Patient Instructions (Signed)
Your physician recommends that you return for lab work. This will not need to be fasting. It is okay to eat. You will be notified of these results by phone or by mail if you do not have my chart.   Your physician wants you to follow-up in: 6 months or sooner if needed. You will receive a reminder letter in the mail two months in advance. If you don't receive a letter, please call our office to schedule the follow-up appointment.  If you need a refill on your cardiac medications before your next appointment, please call your pharmacy.

## 2016-03-06 ENCOUNTER — Other Ambulatory Visit: Payer: Self-pay | Admitting: Cardiovascular Disease

## 2016-03-06 DIAGNOSIS — I482 Chronic atrial fibrillation: Secondary | ICD-10-CM | POA: Diagnosis not present
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
## 2016-03-07 LAB — BASIC METABOLIC PANEL
BUN/Creatinine Ratio: 14 (ref 12–28)
BUN: 11 mg/dL (ref 8–27)
CALCIUM: 9.1 mg/dL (ref 8.7–10.3)
CHLORIDE: 100 mmol/L (ref 96–106)
CO2: 24 mmol/L (ref 18–29)
Creatinine, Ser: 0.81 mg/dL (ref 0.57–1.00)
GFR calc Af Amer: 76 mL/min/{1.73_m2} (ref >59)
GFR calc non Af Amer: 66 mL/min/{1.73_m2} (ref >59)
GLUCOSE: 133 mg/dL — AB (ref 65–99)
POTASSIUM: 4.1 mmol/L (ref 3.5–5.2)
SODIUM: 139 mmol/L (ref 134–144)

## 2016-03-07 LAB — MAGNESIUM: MAGNESIUM: 2.1 mg/dL (ref 1.6–2.3)

## 2016-03-07 LAB — TSH: TSH: 2.36 u[IU]/mL (ref 0.450–4.500)

## 2016-03-08 DIAGNOSIS — R609 Edema, unspecified: Secondary | ICD-10-CM | POA: Diagnosis not present

## 2016-03-14 DIAGNOSIS — Z713 Dietary counseling and surveillance: Secondary | ICD-10-CM | POA: Diagnosis not present

## 2016-03-14 DIAGNOSIS — I672 Cerebral atherosclerosis: Secondary | ICD-10-CM | POA: Diagnosis not present

## 2016-03-14 DIAGNOSIS — Z6820 Body mass index (BMI) 20.0-20.9, adult: Secondary | ICD-10-CM | POA: Diagnosis not present

## 2016-03-14 DIAGNOSIS — G319 Degenerative disease of nervous system, unspecified: Secondary | ICD-10-CM | POA: Diagnosis not present

## 2016-03-14 DIAGNOSIS — Z299 Encounter for prophylactic measures, unspecified: Secondary | ICD-10-CM | POA: Diagnosis not present

## 2016-03-14 DIAGNOSIS — H7492 Unspecified disorder of left middle ear and mastoid: Secondary | ICD-10-CM | POA: Diagnosis not present

## 2016-03-14 DIAGNOSIS — S0990XA Unspecified injury of head, initial encounter: Secondary | ICD-10-CM | POA: Diagnosis not present

## 2016-03-14 DIAGNOSIS — F419 Anxiety disorder, unspecified: Secondary | ICD-10-CM | POA: Diagnosis not present

## 2016-03-14 DIAGNOSIS — R51 Headache: Secondary | ICD-10-CM | POA: Diagnosis not present

## 2016-03-20 ENCOUNTER — Telehealth: Payer: Self-pay | Admitting: *Deleted

## 2016-03-20 NOTE — Telephone Encounter (Signed)
Left message to return a call to discuss lab results. 

## 2016-03-20 NOTE — Telephone Encounter (Signed)
-----   Message from Troy Sine, MD sent at 03/11/2016  7:57 AM EDT ----- Labs good x glu increased

## 2016-03-21 ENCOUNTER — Telehealth: Payer: Self-pay | Admitting: Cardiovascular Disease

## 2016-03-21 NOTE — Telephone Encounter (Signed)
New message ° ° ° °Pt verbalized that she is returning call for rn  °

## 2016-03-21 NOTE — Telephone Encounter (Signed)
Results given, notes updated.

## 2016-03-22 DIAGNOSIS — M5136 Other intervertebral disc degeneration, lumbar region: Secondary | ICD-10-CM | POA: Diagnosis not present

## 2016-03-28 ENCOUNTER — Other Ambulatory Visit (HOSPITAL_COMMUNITY): Payer: Self-pay | Admitting: Physical Medicine and Rehabilitation

## 2016-03-28 DIAGNOSIS — M5136 Other intervertebral disc degeneration, lumbar region: Secondary | ICD-10-CM

## 2016-04-04 ENCOUNTER — Ambulatory Visit (HOSPITAL_COMMUNITY)
Admission: RE | Admit: 2016-04-04 | Discharge: 2016-04-04 | Disposition: A | Discharge status: Home / Self Care | Payer: Medicare Other | Source: Ambulatory Visit | Attending: Physical Medicine and Rehabilitation | Admitting: Physical Medicine and Rehabilitation

## 2016-04-04 DIAGNOSIS — M48061 Spinal stenosis, lumbar region without neurogenic claudication: Secondary | ICD-10-CM | POA: Diagnosis not present

## 2016-04-04 DIAGNOSIS — M545 Low back pain: Secondary | ICD-10-CM | POA: Diagnosis not present

## 2016-04-04 DIAGNOSIS — M5136 Other intervertebral disc degeneration, lumbar region: Secondary | ICD-10-CM | POA: Diagnosis not present

## 2016-04-10 DIAGNOSIS — K1120 Sialoadenitis, unspecified: Secondary | ICD-10-CM | POA: Diagnosis not present

## 2016-04-15 ENCOUNTER — Other Ambulatory Visit (HOSPITAL_COMMUNITY): Payer: Self-pay

## 2016-04-15 DIAGNOSIS — K115 Sialolithiasis: Secondary | ICD-10-CM

## 2016-04-17 ENCOUNTER — Telehealth: Payer: Self-pay | Admitting: Gynecology

## 2016-05-02 DIAGNOSIS — Z1231 Encounter for screening mammogram for malignant neoplasm of breast: Secondary | ICD-10-CM | POA: Diagnosis not present

## 2016-05-03 DIAGNOSIS — M5136 Other intervertebral disc degeneration, lumbar region: Secondary | ICD-10-CM | POA: Diagnosis not present

## 2016-05-07 NOTE — Telephone Encounter (Signed)
Prolia  Due after 05/11/16.  Complete exam 06/2015 TF, Calcium 9.1  03/06/2016   Insurance duductible $183  (met) , Co ins  20%  $420  . No PA needed, Secondary coverage ext cost for pt $0. Appointment at 2 00 05/23/16 .

## 2016-05-08 ENCOUNTER — Encounter: Payer: Self-pay | Admitting: Gynecology

## 2016-05-23 ENCOUNTER — Ambulatory Visit (INDEPENDENT_AMBULATORY_CARE_PROVIDER_SITE_OTHER): Payer: Medicare Other | Admitting: *Deleted

## 2016-05-23 DIAGNOSIS — M810 Age-related osteoporosis without current pathological fracture: Secondary | ICD-10-CM | POA: Diagnosis not present

## 2016-05-23 MED ORDER — DENOSUMAB 60 MG/ML ~~LOC~~ SOLN
60.0000 mg | Freq: Once | SUBCUTANEOUS | Status: AC
  Administered 2016-05-23: 60 mg via SUBCUTANEOUS

## 2016-05-28 DIAGNOSIS — N3941 Urge incontinence: Secondary | ICD-10-CM | POA: Diagnosis not present

## 2016-05-28 DIAGNOSIS — N200 Calculus of kidney: Secondary | ICD-10-CM | POA: Diagnosis not present

## 2016-05-28 NOTE — Telephone Encounter (Signed)
prolia injection given 05/23/16  Next injection due after 11/22/16

## 2016-05-30 DIAGNOSIS — Z6821 Body mass index (BMI) 21.0-21.9, adult: Secondary | ICD-10-CM | POA: Diagnosis not present

## 2016-05-30 DIAGNOSIS — K1120 Sialoadenitis, unspecified: Secondary | ICD-10-CM | POA: Diagnosis not present

## 2016-05-30 DIAGNOSIS — M1990 Unspecified osteoarthritis, unspecified site: Secondary | ICD-10-CM | POA: Diagnosis not present

## 2016-05-30 DIAGNOSIS — Z713 Dietary counseling and surveillance: Secondary | ICD-10-CM | POA: Diagnosis not present

## 2016-05-30 DIAGNOSIS — Z789 Other specified health status: Secondary | ICD-10-CM | POA: Diagnosis not present

## 2016-05-30 DIAGNOSIS — Z299 Encounter for prophylactic measures, unspecified: Secondary | ICD-10-CM | POA: Diagnosis not present

## 2016-06-05 DIAGNOSIS — M79672 Pain in left foot: Secondary | ICD-10-CM | POA: Diagnosis not present

## 2016-06-05 DIAGNOSIS — Z713 Dietary counseling and surveillance: Secondary | ICD-10-CM | POA: Diagnosis not present

## 2016-06-05 DIAGNOSIS — Z299 Encounter for prophylactic measures, unspecified: Secondary | ICD-10-CM | POA: Diagnosis not present

## 2016-06-05 DIAGNOSIS — G4700 Insomnia, unspecified: Secondary | ICD-10-CM | POA: Diagnosis not present

## 2016-06-05 DIAGNOSIS — Z6826 Body mass index (BMI) 26.0-26.9, adult: Secondary | ICD-10-CM | POA: Diagnosis not present

## 2016-06-05 DIAGNOSIS — M19072 Primary osteoarthritis, left ankle and foot: Secondary | ICD-10-CM | POA: Diagnosis not present

## 2016-06-05 DIAGNOSIS — Z87311 Personal history of (healed) other pathological fracture: Secondary | ICD-10-CM | POA: Diagnosis not present

## 2016-06-07 DIAGNOSIS — K115 Sialolithiasis: Secondary | ICD-10-CM | POA: Diagnosis not present

## 2016-06-10 DIAGNOSIS — J479 Bronchiectasis, uncomplicated: Secondary | ICD-10-CM | POA: Diagnosis not present

## 2016-06-10 DIAGNOSIS — R220 Localized swelling, mass and lump, head: Secondary | ICD-10-CM | POA: Diagnosis not present

## 2016-06-10 DIAGNOSIS — K115 Sialolithiasis: Secondary | ICD-10-CM | POA: Diagnosis not present

## 2016-06-10 DIAGNOSIS — I700 Atherosclerosis of aorta: Secondary | ICD-10-CM | POA: Diagnosis not present

## 2016-06-20 ENCOUNTER — Ambulatory Visit (INDEPENDENT_AMBULATORY_CARE_PROVIDER_SITE_OTHER): Payer: Medicare Other | Admitting: Otolaryngology

## 2016-06-27 ENCOUNTER — Telehealth: Payer: Self-pay | Admitting: Cardiovascular Disease

## 2016-06-27 NOTE — Telephone Encounter (Signed)
New Message    Patient c/o Palpitations:  High priority if patient c/o lightheadedness and shortness of breath.  1. How long have you been having palpitations? Since last night  2. Are you currently experiencing lightheadedness and shortness of breath? SOB-cant do to much before getting tired  3. Have you checked your BP and heart rate? (document readings) 130/90-several weeks ago.  4. Are you experiencing any other symptoms? No  Pt calling complaining of heart fluttering since last night at church. She called to schedule 6 month follow up in April 25th.

## 2016-06-27 NOTE — Telephone Encounter (Signed)
Spoke with pt she is verbally SOB and she states that she is always that way and has been having palpitations for at least a week. Pt denies any other symptoms. Made appt for Monday 07-01-16@2pm  w/barrett. Pt informed if symptoms worsen or any other symptoms develop to go directly to the Er. Pt verbalizes understanding.

## 2016-07-01 ENCOUNTER — Ambulatory Visit (INDEPENDENT_AMBULATORY_CARE_PROVIDER_SITE_OTHER): Payer: Medicare Other | Admitting: Physician Assistant

## 2016-07-01 ENCOUNTER — Encounter: Payer: Self-pay | Admitting: Physician Assistant

## 2016-07-01 VITALS — BP 107/64 | HR 67 | Ht 63.0 in | Wt 128.0 lb

## 2016-07-01 DIAGNOSIS — R002 Palpitations: Secondary | ICD-10-CM | POA: Diagnosis not present

## 2016-07-01 DIAGNOSIS — R0789 Other chest pain: Secondary | ICD-10-CM | POA: Diagnosis not present

## 2016-07-01 NOTE — Patient Instructions (Signed)
Medication Instructions:  Your physician recommends that you continue on your current medications as directed. Please refer to the Current Medication list given to you today.   If you need a refill on your cardiac medications before your next appointment, please call your pharmacy.  Labwork: PLEASE HAVE PRIMARY CARE FAX RECENT LAB TO [place] AT [phone]  Follow-Up: Your physician wants you to follow-up in: IN [place]. You will receive a reminder letter in the mail two months in advance. If you don't receive a letter, please call our office IN June to schedule the follow-up appointment.    Thank you for choosing [facility] at [employer], LPN RHONDA BARRETT, PA-C

## 2016-07-01 NOTE — Progress Notes (Signed)
Cardiology Office Note   Date:  07/01/2016   ID:  Debra Richards, DOB [birth year], MRN [id number]  PCP:  Glenda Chroman, MD  Cardiologist:  Dr. Claiborne Billings, 03/05/2016  Rosaria Ferries, PA-C   Chief Complaint  Patient presents with  . Follow-up  . Shortness of Breath    occassionally.  . Chest Pain    sharp pain    History of Present Illness: Debra Richards is a 83 y.o. female with a history of intermittent palpitations, atypical chest pain, LBBB, RA, GERD, EF 50-55% w/ grade 1 dd, mild MR (no MVP), mild LVH by echo 2017, MV 2013 w/out scar or ischemia  02/01 phone notes regarding shortness of breath and palpitations, appointment made.  Debra Richards presents for cardiology evaluation and follow up.   She has fallen several times. They are mechanical falls, no LOC. She has MS issues, especially with her R leg.  8 days ago, she had L chest pain during church. It was a cramping pain, in her L chest and under her L arm. It was sharp, and severe for a few seconds. It gradually eased off and was significantly better in 30 minutes or so. She did not seek help or take any rx.   Every morning, she sits on the side of the bed and tries to get it together. She will walk with a cane or a walker for a while, then get better. She drives short distances. She has stairs but does not go up often. She holds to the rail and goes slow. She does not get chest pain or SOB. She cleans house some, but does not use the vacuum cleaner but hurts her chest to use it. She was exercising until last October, her back gives her trouble. Activity makes her back hurt. Her back also hurts at night.   She will have occasional flare-ups of mid-R chest pain and rapid heart beat. These are spontaneous and resolve without intervention. That has been going on for years.   Past Medical History:  Diagnosis Date  . A-fib ([place]) 2014   Cardionet  . Atrial fibrillation ([place])   . Coronary artery disease     CARDIOLOGIST- DR Rollene Fare-- LAST VISIT 04-04-11 (will request note)---   (per note cardiolite 2009 negative,  echo jan.2010  . Dysrhythmia    HX SVT AND PAF--- occ. palpitations  . Edema occasional in ankles  . GERD (gastroesophageal reflux disease)    CONTROLLED W/ ACIPHEX AND prn prevacid  . H/O Doppler ultrasound 2009   normal bilateral extremity venous duplex doppler  . H/O hiatal hernia   . History of kidney stones   . Hypothyroidism   . LBBB (left bundle branch block) CHRONIC   GXT, 2013  . Murmur 2010   no sig change per study  . MVP (mitral valve prolapse) MILD  . MVP (mitral valve prolapse) 2006   no evidence in study of MVP  . Osteoporosis 12/2011    T score -2.7  . RA (rheumatoid arthritis) ([place])    followed by dr Charlestine Night  . Right ureteral stone   . Rosacea   . SOB (shortness of breath) on exertion 2012   Echo, EF =50-55%, mild abnormalities    Past Surgical History:  Procedure Laterality Date  . CATARACT EXTRACTION W/ INTRAOCULAR LENS  IMPLANT, BILATERAL  4 yrs ago   both eyes  . CHOLECYSTECTOMY  2005  . EXTRACORPOREAL SHOCK WAVE LITHOTRIPSY  09-24-10   and 1990  .  Femur fx    . HIP ARTHROPLASTY Right 10/12/2012   Procedure: ARTHROPLASTY BIPOLAR HIP;  Surgeon: Gearlean Alf, MD;  Location: [facility];  Service: Orthopedics;  Laterality: Right;  . KNEE ARTHROSCOPY  04/17/2011, right   Procedure: ARTHROSCOPY KNEE;  Surgeon: Gearlean Alf;  Location: [facility];  Service: Orthopedics;  Laterality: Right;  WITH LATERAL MENISCAL DEBRIDEMENT  . MOLES EXCISED  2012   on nose  . Sternum Fx    . TOTAL KNEE ARTHROPLASTY  03/23/2012   Procedure: TOTAL KNEE ARTHROPLASTY;  Surgeon: Gearlean Alf, MD;  Location: [facility];  Service: Orthopedics;  Laterality: Right;  Marland Kitchen VAGINAL HYSTERECTOMY  1970   Leiomyomata    Current Outpatient Prescriptions  Medication Sig Dispense Refill  . amoxicillin-clavulanate (AUGMENTIN) 875-125 MG tablet Take 1 tablet by  mouth 2 (two) times daily. 14 tablet 0  . aspirin EC 81 MG tablet Take 81 mg by mouth every morning.    . cholecalciferol (VITAMIN D) 1000 units tablet Take 1,000 Units by mouth every morning.    . clorazepate (TRANXENE) 7.5 MG tablet Take 7.5 mg by mouth at bedtime.     Marland Kitchen denosumab (PROLIA) 60 MG/ML SOLN injection Inject 60 mg into the skin every 6 (six) months. Administer in upper arm, thigh, or abdomen    . levothyroxine (SYNTHROID, LEVOTHROID) 50 MCG tablet TAKE 1 TABLET DAILY BEFORE BREAKFAST. 30 tablet 6  . meclizine (ANTIVERT) 12.5 MG tablet Take 12.5 mg by mouth daily as needed for dizziness. Reported on 07/24/2015    . Propylene Glycol-Glycerin (SOOTHE) 0.6-0.6 % SOLN Apply 1 drop to eye daily as needed (for eye irritation/dry eye).    . ranitidine (ZANTAC) 150 MG tablet Take 75 mg by mouth at bedtime.     . vitamin E 400 UNIT capsule Take 400 Units by mouth every morning.      No current facility-administered medications for this visit.     Allergies:   Contrast media [iodinated diagnostic agents]; Levaquin [levofloxacin in d5w]; Penicillins; and Ciprofloxacin    Social History:  The patient  reports that she has never smoked. She has never used smokeless tobacco. She reports that she does not drink alcohol or use drugs.   Family History:  The patient's family history includes Cancer in her mother; Crohn's disease in her daughter; Heart disease in her father; Ovarian cancer in her maternal grandmother; Stroke in her father.    ROS:  Please see the history of present illness. All other systems are reviewed and negative.    PHYSICAL EXAM: VS:  BP 107/64   Pulse 67   Ht 5\' 3"  (1.6 m)   Wt 128 lb (58.1 kg)   BMI 22.67 kg/m  , BMI Body mass index is 22.67 kg/m. GEN: Well nourished, well developed, female in no acute distress  HEENT: normal for age  Neck: no JVD, no carotid bruit, no masses Cardiac: RRR; soft murmur, no rubs, or gallops Respiratory:  clear to auscultation  bilaterally, normal work of breathing GI: soft, nontender, nondistended, + BS MS: no deformity or atrophy; no edema; distal pulses are 2+ in all 4 extremities   Skin: warm and dry, no rash Neuro:  Strength and sensation are intact Psych: euthymic mood, full affect   EKG:  EKG is ordered today. The ekg ordered today demonstrates SR, LBBB is old  ECHO: 01/03/2016 - Left ventricle: The cavity size was normal. Wall thickness was   normal. Systolic function was normal. The  estimated ejection   fraction was in the range of 55% to 60%. Doppler parameters are   consistent with abnormal left ventricular relaxation (grade 1   diastolic dysfunction). - Ventricular septum: Septal motion showed abnormal function and   dyssynergy. These changes are consistent with a post-thoracotomy   state. - Aortic valve: There was trivial regurgitation. - Mitral valve: Calcified annulus. No prolapse - Pericardium, extracardiac: A trivial pericardial effusion was   identified.  Recent Labs: 02/16/2016: ALT 18 02/20/2016: Hemoglobin 11.1; Platelets 123 03/06/2016: BUN 11; Creatinine, Ser 0.81; Magnesium 2.1; Potassium 4.1; Sodium 139; TSH 2.360    Lipid Panel No results found for: CHOL, TRIG, HDL, CHOLHDL, VLDL, LDLCALC, LDLDIRECT   Wt Readings from Last 3 Encounters:  07/01/16 128 lb (58.1 kg)  03/05/16 125 lb (56.7 kg)  02/15/16 128 lb 3.2 oz (58.2 kg)     Other studies Reviewed: Additional studies/ records that were reviewed today include: office notes and testing.  ASSESSMENT AND PLAN:  1.  Chest pain: Her symptoms are atypical and she has had a negative MV in the past. She does not currently need any additional workup.   2. Palpitations: she has no documented arrhythmia. Her palpitations are unchanged, not causing any additional symptoms. No further evaluation at this time.  3. Parotid gland problem: hx of infection. She has seen Dr Emington Callas, he told her she needs surgery. Advised no further  cardiac evaluation is needed prior to surgery.   Current medicines are reviewed at length with the patient today.  The patient does not have concerns regarding medicines.  The following changes have been made:  no change  Labs/ tests ordered today include:  No orders of the defined types were placed in this encounter.    Disposition:   FU with Dr Claiborne Billings  Signed, Rosaria Ferries, PA-C  07/01/2016 3:27 PM    [facility] Phone: [phone]; Fax: [phone]  This note was written with the assistance of speech recognition software. Please excuse any transcriptional errors.

## 2016-07-30 DIAGNOSIS — D485 Neoplasm of uncertain behavior of skin: Secondary | ICD-10-CM | POA: Diagnosis not present

## 2016-07-30 DIAGNOSIS — L820 Inflamed seborrheic keratosis: Secondary | ICD-10-CM | POA: Diagnosis not present

## 2016-07-30 DIAGNOSIS — Z85828 Personal history of other malignant neoplasm of skin: Secondary | ICD-10-CM | POA: Diagnosis not present

## 2016-07-31 DIAGNOSIS — Z713 Dietary counseling and surveillance: Secondary | ICD-10-CM | POA: Diagnosis not present

## 2016-07-31 DIAGNOSIS — M25559 Pain in unspecified hip: Secondary | ICD-10-CM | POA: Diagnosis not present

## 2016-07-31 DIAGNOSIS — Z6821 Body mass index (BMI) 21.0-21.9, adult: Secondary | ICD-10-CM | POA: Diagnosis not present

## 2016-07-31 DIAGNOSIS — S72009A Fracture of unspecified part of neck of unspecified femur, initial encounter for closed fracture: Secondary | ICD-10-CM | POA: Diagnosis not present

## 2016-07-31 DIAGNOSIS — Z299 Encounter for prophylactic measures, unspecified: Secondary | ICD-10-CM | POA: Diagnosis not present

## 2016-07-31 DIAGNOSIS — M5430 Sciatica, unspecified side: Secondary | ICD-10-CM | POA: Diagnosis not present

## 2016-08-02 ENCOUNTER — Telehealth: Payer: Self-pay | Admitting: Cardiology

## 2016-08-02 NOTE — Telephone Encounter (Signed)
There is a certain process that is followed in order for patient to transfer her care within the practice. It first needs to be cleared by her primary cardiologist Dr. Claiborne Billings. Then she could be scheduled with a provider in [place]. I have not seen her before, she could be seen by any provider in this office.

## 2016-08-02 NOTE — Telephone Encounter (Signed)
Mrs. Debra Richards called the [facility]. She is wanting to transfer her care to the [facility] from [facility] due to transportation issues.  Will forward to Dr. Domenic Polite.

## 2016-08-08 DIAGNOSIS — R5383 Other fatigue: Secondary | ICD-10-CM | POA: Diagnosis not present

## 2016-08-08 DIAGNOSIS — I059 Rheumatic mitral valve disease, unspecified: Secondary | ICD-10-CM | POA: Diagnosis not present

## 2016-08-08 DIAGNOSIS — S72009A Fracture of unspecified part of neck of unspecified femur, initial encounter for closed fracture: Secondary | ICD-10-CM | POA: Diagnosis not present

## 2016-08-08 DIAGNOSIS — Z713 Dietary counseling and surveillance: Secondary | ICD-10-CM | POA: Diagnosis not present

## 2016-08-08 DIAGNOSIS — M25559 Pain in unspecified hip: Secondary | ICD-10-CM | POA: Diagnosis not present

## 2016-08-08 DIAGNOSIS — Z299 Encounter for prophylactic measures, unspecified: Secondary | ICD-10-CM | POA: Diagnosis not present

## 2016-08-08 DIAGNOSIS — Z6821 Body mass index (BMI) 21.0-21.9, adult: Secondary | ICD-10-CM | POA: Diagnosis not present

## 2016-08-21 DIAGNOSIS — K115 Sialolithiasis: Secondary | ICD-10-CM | POA: Diagnosis not present

## 2016-08-28 DIAGNOSIS — L821 Other seborrheic keratosis: Secondary | ICD-10-CM | POA: Diagnosis not present

## 2016-08-28 DIAGNOSIS — Z85828 Personal history of other malignant neoplasm of skin: Secondary | ICD-10-CM | POA: Diagnosis not present

## 2016-08-28 DIAGNOSIS — L570 Actinic keratosis: Secondary | ICD-10-CM | POA: Diagnosis not present

## 2016-09-10 DIAGNOSIS — Z299 Encounter for prophylactic measures, unspecified: Secondary | ICD-10-CM | POA: Diagnosis not present

## 2016-09-10 DIAGNOSIS — G4700 Insomnia, unspecified: Secondary | ICD-10-CM | POA: Diagnosis not present

## 2016-09-10 DIAGNOSIS — M545 Low back pain: Secondary | ICD-10-CM | POA: Diagnosis not present

## 2016-09-10 DIAGNOSIS — R42 Dizziness and giddiness: Secondary | ICD-10-CM | POA: Diagnosis not present

## 2016-09-10 DIAGNOSIS — E039 Hypothyroidism, unspecified: Secondary | ICD-10-CM | POA: Diagnosis not present

## 2016-09-10 DIAGNOSIS — Z6820 Body mass index (BMI) 20.0-20.9, adult: Secondary | ICD-10-CM | POA: Diagnosis not present

## 2016-09-10 DIAGNOSIS — R1013 Epigastric pain: Secondary | ICD-10-CM | POA: Diagnosis not present

## 2016-09-10 DIAGNOSIS — Z713 Dietary counseling and surveillance: Secondary | ICD-10-CM | POA: Diagnosis not present

## 2016-09-16 DIAGNOSIS — Z713 Dietary counseling and surveillance: Secondary | ICD-10-CM | POA: Diagnosis not present

## 2016-09-16 DIAGNOSIS — Z6820 Body mass index (BMI) 20.0-20.9, adult: Secondary | ICD-10-CM | POA: Diagnosis not present

## 2016-09-16 DIAGNOSIS — Z299 Encounter for prophylactic measures, unspecified: Secondary | ICD-10-CM | POA: Diagnosis not present

## 2016-09-16 DIAGNOSIS — J069 Acute upper respiratory infection, unspecified: Secondary | ICD-10-CM | POA: Diagnosis not present

## 2016-09-16 DIAGNOSIS — Z789 Other specified health status: Secondary | ICD-10-CM | POA: Diagnosis not present

## 2016-09-18 ENCOUNTER — Ambulatory Visit: Payer: Medicare Other | Admitting: Cardiovascular Disease

## 2016-09-23 ENCOUNTER — Encounter: Payer: Self-pay | Admitting: *Deleted

## 2016-09-23 ENCOUNTER — Ambulatory Visit: Payer: Medicare Other | Admitting: Cardiovascular Disease

## 2016-10-01 DIAGNOSIS — M5136 Other intervertebral disc degeneration, lumbar region: Secondary | ICD-10-CM | POA: Diagnosis not present

## 2016-10-08 ENCOUNTER — Encounter: Payer: Self-pay | Admitting: Cardiovascular Disease

## 2016-10-08 ENCOUNTER — Ambulatory Visit (INDEPENDENT_AMBULATORY_CARE_PROVIDER_SITE_OTHER): Payer: Medicare Other | Admitting: Cardiovascular Disease

## 2016-10-08 VITALS — BP 112/57 | HR 72 | Ht 63.0 in | Wt 130.0 lb

## 2016-10-08 DIAGNOSIS — I2510 Atherosclerotic heart disease of native coronary artery without angina pectoris: Secondary | ICD-10-CM

## 2016-10-08 DIAGNOSIS — R0789 Other chest pain: Secondary | ICD-10-CM | POA: Diagnosis not present

## 2016-10-08 DIAGNOSIS — I447 Left bundle-branch block, unspecified: Secondary | ICD-10-CM | POA: Diagnosis not present

## 2016-10-08 DIAGNOSIS — I059 Rheumatic mitral valve disease, unspecified: Secondary | ICD-10-CM | POA: Diagnosis not present

## 2016-10-08 DIAGNOSIS — I3481 Nonrheumatic mitral (valve) annulus calcification: Secondary | ICD-10-CM

## 2016-10-08 DIAGNOSIS — E039 Hypothyroidism, unspecified: Secondary | ICD-10-CM
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
## 2016-10-08 NOTE — Patient Instructions (Signed)
NO CHANGES WITH TREATMENT OR MEDICATIONS    Your physician wants you to follow-up in [place]. You will receive a reminder letter in the mail two months in advance. If you don't receive a letter, please call our office to schedule the follow-up appointment.     If you need a refill on your cardiac medications before your next appointment, please call your pharmacy.

## 2016-10-22 DIAGNOSIS — M5136 Other intervertebral disc degeneration, lumbar region: Secondary | ICD-10-CM | POA: Diagnosis not present

## 2016-11-05 DIAGNOSIS — Z6820 Body mass index (BMI) 20.0-20.9, adult: Secondary | ICD-10-CM | POA: Diagnosis not present

## 2016-11-05 DIAGNOSIS — Z299 Encounter for prophylactic measures, unspecified: Secondary | ICD-10-CM | POA: Diagnosis not present

## 2016-11-05 DIAGNOSIS — E8940 Asymptomatic postprocedural ovarian failure: Secondary | ICD-10-CM | POA: Diagnosis not present

## 2016-11-05 DIAGNOSIS — Z1211 Encounter for screening for malignant neoplasm of colon: Secondary | ICD-10-CM | POA: Diagnosis not present

## 2016-11-05 DIAGNOSIS — R5383 Other fatigue: Secondary | ICD-10-CM | POA: Diagnosis not present

## 2016-11-05 DIAGNOSIS — E039 Hypothyroidism, unspecified: Secondary | ICD-10-CM | POA: Diagnosis not present

## 2016-11-05 DIAGNOSIS — Z7189 Other specified counseling: Secondary | ICD-10-CM | POA: Diagnosis not present

## 2016-11-05 DIAGNOSIS — E7800 Pure hypercholesterolemia, unspecified: Secondary | ICD-10-CM | POA: Diagnosis not present

## 2016-11-05 DIAGNOSIS — Z1389 Encounter for screening for other disorder: Secondary | ICD-10-CM | POA: Diagnosis not present

## 2016-11-05 DIAGNOSIS — Z0000 Encounter for general adult medical examination without abnormal findings: Secondary | ICD-10-CM | POA: Diagnosis not present

## 2016-11-05 DIAGNOSIS — Z79899 Other long term (current) drug therapy: Secondary | ICD-10-CM | POA: Diagnosis not present

## 2016-11-06 DIAGNOSIS — M5136 Other intervertebral disc degeneration, lumbar region: Secondary | ICD-10-CM | POA: Diagnosis not present

## 2016-11-12 ENCOUNTER — Encounter: Payer: Self-pay | Admitting: Gynecology

## 2016-11-12 ENCOUNTER — Telehealth: Payer: Self-pay | Admitting: Gynecology

## 2016-11-12 NOTE — Telephone Encounter (Signed)
Prolia due 11/22/16.  Calcium 9.1  03/06/16, Complete TF 07/24/2015 . She will need complete exam and Prolia same day. Benefits  Deductible $183 met, co insurance 20% , secondary insurance 80% coverage. Cost for pt $0  Schedule Prolia and complete exam   12/04/16  At 10 30 am  TF

## 2016-11-13 DIAGNOSIS — Z79899 Other long term (current) drug therapy: Secondary | ICD-10-CM | POA: Diagnosis not present

## 2016-11-13 DIAGNOSIS — E039 Hypothyroidism, unspecified: Secondary | ICD-10-CM | POA: Diagnosis not present

## 2016-11-13 DIAGNOSIS — E7800 Pure hypercholesterolemia, unspecified: Secondary | ICD-10-CM | POA: Diagnosis not present

## 2016-11-13 DIAGNOSIS — R5383 Other fatigue: Secondary | ICD-10-CM | POA: Diagnosis not present

## 2016-11-21 DIAGNOSIS — M5136 Other intervertebral disc degeneration, lumbar region: Secondary | ICD-10-CM | POA: Diagnosis not present

## 2016-11-21 DIAGNOSIS — M7061 Trochanteric bursitis, right hip: Secondary | ICD-10-CM | POA: Diagnosis not present

## 2016-11-29 ENCOUNTER — Ambulatory Visit: Payer: Medicare Other | Admitting: Urology

## 2016-12-04 ENCOUNTER — Encounter: Payer: Self-pay | Admitting: Gynecology

## 2016-12-04 ENCOUNTER — Ambulatory Visit (INDEPENDENT_AMBULATORY_CARE_PROVIDER_SITE_OTHER): Payer: Medicare Other | Admitting: Gynecology

## 2016-12-04 VITALS — BP 122/78 | Ht 64.0 in | Wt 132.0 lb

## 2016-12-04 DIAGNOSIS — Z01411 Encounter for gynecological examination (general) (routine) with abnormal findings: Secondary | ICD-10-CM | POA: Diagnosis not present

## 2016-12-04 DIAGNOSIS — N952 Postmenopausal atrophic vaginitis: Secondary | ICD-10-CM

## 2016-12-04 DIAGNOSIS — M810 Age-related osteoporosis without current pathological fracture: Secondary | ICD-10-CM | POA: Diagnosis not present

## 2016-12-04 MED ORDER — DENOSUMAB 60 MG/ML ~~LOC~~ SOLN
60.0000 mg | Freq: Once | SUBCUTANEOUS | Status: AC
  Administered 2016-12-04: 60 mg via SUBCUTANEOUS

## 2016-12-04 NOTE — Patient Instructions (Signed)
Follow up for your next Prolia shot in 6 months.

## 2016-12-04 NOTE — Progress Notes (Signed)
    Debra Richards 08-13-1925 [id number]        83 y.o.  G2P2002 for breast and pelvic exam  Past medical history,surgical history, problem list, medications, allergies, family history and social history were all reviewed and documented as reviewed in the EPIC chart.  ROS:  Performed with pertinent positives and negatives included in the history, assessment and plan.   Additional significant findings :  None   Exam: Debra Richards assistant Vitals:   12/04/16 1401  BP: 122/78  Weight: 132 lb (59.9 kg)  Height: 5\' 4"  (1.626 m)   Body mass index is 22.66 kg/m.  General appearance:  Normal affect, orientation and appearance. Skin: Grossly normal HEENT: Without gross lesions.  No cervical or supraclavicular adenopathy. Thyroid normal.  Lungs:  Clear without wheezing, rales or rhonchi Cardiac: RR, without RMG Abdominal:  Soft, nontender, without masses, guarding, rebound, organomegaly or hernia Breasts:  Examined lying and sitting without masses, retractions, discharge or axillary adenopathy. Pelvic:  Ext, BUS, Vagina: With atrophic changes  Adnexa: Without masses or tenderness    Anus and perineum: Normal   Rectovaginal: Normal sphincter tone without palpated masses or tenderness.    Assessment/Plan:  83 y.o. [id number] female for breast and pelvic exam.   1. Postmenopausal/atrophic genital changes. Status post TVH in the past for leiomyoma.  No significant hot flushes, night sweats, vaginal dryness. 2. Osteoporosis. DEXA 2013 T score -2.7. Going on 3 years of Prolia and received her shot today. Reports having bone density scheduled next week at her primary physician's office. I was unable to get a copy of her prior study reported in 2016. Patient is going to make sure that they send me a copy of her most recent bone density. 3. Mammography 03/2016. Continue with annual mammography when due. SBE monthly reviewed. Breast exam normal today. 4. Pap smear 2011. No Pap smear done  today. We both agree to stop screening based on current screening guidelines. History of hysterectomy, age in no history of abnormal Pap smears previously. 5. Colonoscopy 2011. Repeat at their recommended interval. 6. Health maintenance. No routine lab work done as patient does this elsewhere. Follow up 1 year, sooner as needed.   [name] MD, 2:25 PM 12/04/2016

## 2016-12-04 NOTE — Addendum Note (Signed)
Addended by: Nelva Nay on: 12/04/2016 04:31 PM   Modules accepted: Orders

## 2016-12-05 DIAGNOSIS — Z7982 Long term (current) use of aspirin: Secondary | ICD-10-CM | POA: Diagnosis not present

## 2016-12-05 DIAGNOSIS — S92332A Displaced fracture of third metatarsal bone, left foot, initial encounter for closed fracture: Secondary | ICD-10-CM | POA: Diagnosis not present

## 2016-12-05 DIAGNOSIS — S92302A Fracture of unspecified metatarsal bone(s), left foot, initial encounter for closed fracture: Secondary | ICD-10-CM | POA: Diagnosis not present

## 2016-12-05 DIAGNOSIS — W1839XA Other fall on same level, initial encounter: Secondary | ICD-10-CM | POA: Diagnosis not present

## 2016-12-05 DIAGNOSIS — S92322A Displaced fracture of second metatarsal bone, left foot, initial encounter for closed fracture: Secondary | ICD-10-CM | POA: Diagnosis not present

## 2016-12-05 DIAGNOSIS — Z79899 Other long term (current) drug therapy: Secondary | ICD-10-CM | POA: Diagnosis not present

## 2016-12-06 ENCOUNTER — Telehealth: Payer: Self-pay | Admitting: *Deleted

## 2016-12-06 NOTE — Telephone Encounter (Signed)
-----   Message from [name], MD sent at 12/04/2016  2:28 PM EDT ----- Patient reports having bone density scheduled next week at her primary physician's office. I need to get a copy of that report when it's done.

## 2016-12-06 NOTE — Telephone Encounter (Signed)
Pt informed to have report faxed to office for Dr.Fontaine

## 2016-12-09 DIAGNOSIS — S92322A Displaced fracture of second metatarsal bone, left foot, initial encounter for closed fracture: Secondary | ICD-10-CM | POA: Diagnosis not present

## 2016-12-09 DIAGNOSIS — M79675 Pain in left toe(s): Secondary | ICD-10-CM | POA: Diagnosis not present

## 2016-12-16 NOTE — Telephone Encounter (Signed)
Prolia given 12/04/16  Next injection 06/07/17

## 2017-01-28 DIAGNOSIS — S92355D Nondisplaced fracture of fifth metatarsal bone, left foot, subsequent encounter for fracture with routine healing: Secondary | ICD-10-CM | POA: Diagnosis not present

## 2017-01-28 DIAGNOSIS — M79672 Pain in left foot: Secondary | ICD-10-CM | POA: Diagnosis not present

## 2017-02-05 DIAGNOSIS — Z299 Encounter for prophylactic measures, unspecified: Secondary | ICD-10-CM | POA: Diagnosis not present

## 2017-02-05 DIAGNOSIS — M810 Age-related osteoporosis without current pathological fracture: Secondary | ICD-10-CM | POA: Diagnosis not present

## 2017-02-05 DIAGNOSIS — M84375G Stress fracture, left foot, subsequent encounter for fracture with delayed healing: Secondary | ICD-10-CM | POA: Diagnosis not present

## 2017-02-05 DIAGNOSIS — F419 Anxiety disorder, unspecified: Secondary | ICD-10-CM | POA: Diagnosis not present

## 2017-02-05 DIAGNOSIS — Z6820 Body mass index (BMI) 20.0-20.9, adult: Secondary | ICD-10-CM | POA: Diagnosis not present

## 2017-02-05 DIAGNOSIS — Z713 Dietary counseling and surveillance: Secondary | ICD-10-CM | POA: Diagnosis not present

## 2017-02-05 DIAGNOSIS — E039 Hypothyroidism, unspecified: Secondary | ICD-10-CM | POA: Diagnosis not present

## 2017-02-05 DIAGNOSIS — E7800 Pure hypercholesterolemia, unspecified: Secondary | ICD-10-CM | POA: Diagnosis not present

## 2017-02-05 DIAGNOSIS — G4700 Insomnia, unspecified: Secondary | ICD-10-CM | POA: Diagnosis not present

## 2017-03-04 IMAGING — DX DG CHEST 2V
2 series · 2 of 2 positions shown · non-contrast
Comparison: 02/28/2013

CLINICAL DATA: Dizziness and confusion starting yesterday worse
today, recent UTI, history coronary artery disease, atrial
fibrillation, rheumatoid arthritis

EXAM:
CHEST  2 VIEW

[chest pa]
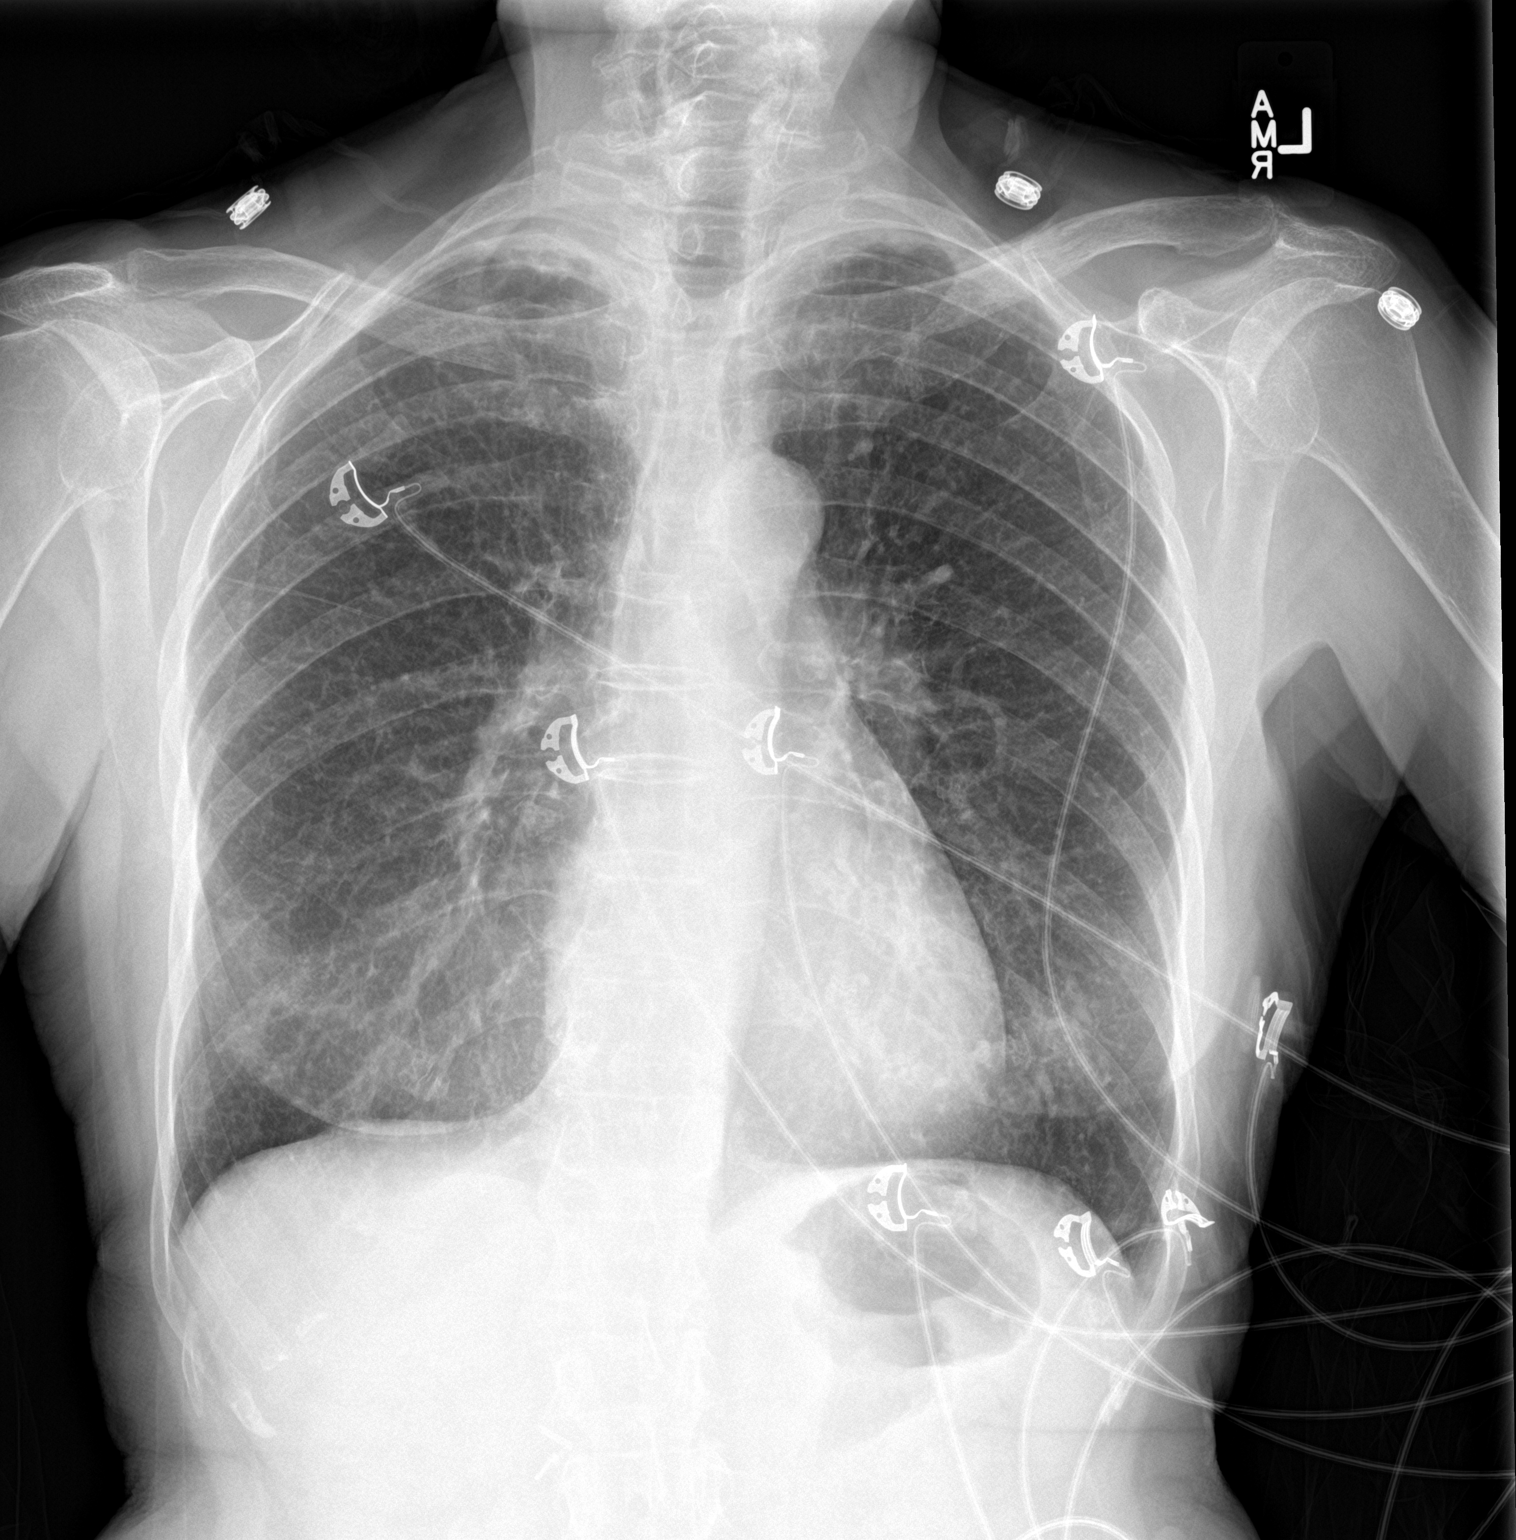

[chest lat]
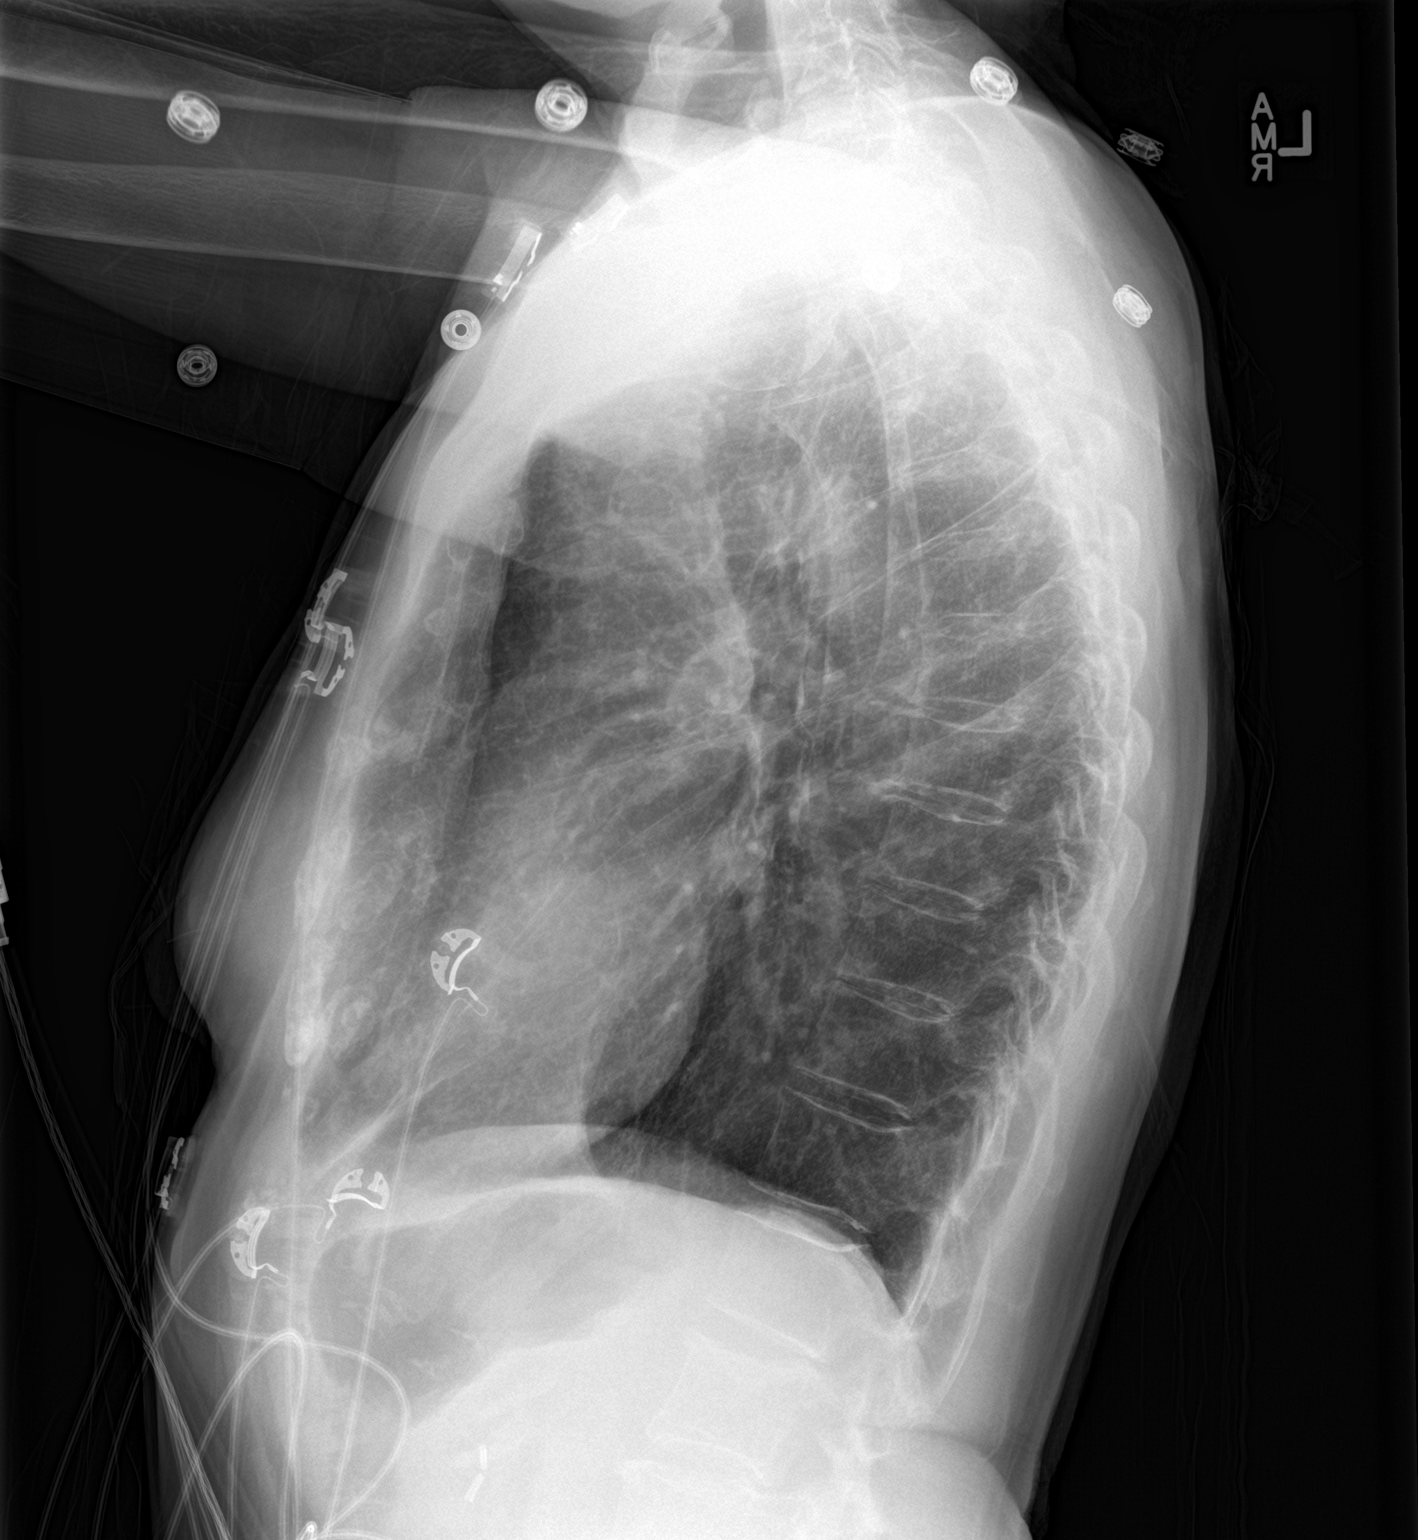

[2 of 2 positions shown; findings below may reference images not displayed]

FINDINGS: Normal heart size, mediastinal contours and pulmonary vascularity.

Emphysematous and bronchitic changes consistent with COPD.

Biapical scarring unchanged.

Minimal chronic interstitial prominence, stable.

No definite acute infiltrate, pleural effusion or pneumothorax.

Bones appear demineralized.
IMPRESSION: COPD changes with biapical scarring and chronic interstitial
prominence.

No definite acute abnormalities.

## 2017-03-12 DIAGNOSIS — H01025 Squamous blepharitis left lower eyelid: Secondary | ICD-10-CM | POA: Diagnosis not present

## 2017-03-12 DIAGNOSIS — Z961 Presence of intraocular lens: Secondary | ICD-10-CM | POA: Diagnosis not present

## 2017-03-12 DIAGNOSIS — H01022 Squamous blepharitis right lower eyelid: Secondary | ICD-10-CM | POA: Diagnosis not present

## 2017-03-12 DIAGNOSIS — H01021 Squamous blepharitis right upper eyelid: Secondary | ICD-10-CM | POA: Diagnosis not present

## 2017-03-12 DIAGNOSIS — H0014 Chalazion left upper eyelid: Secondary | ICD-10-CM | POA: Diagnosis not present

## 2017-03-12 DIAGNOSIS — H1859 Other hereditary corneal dystrophies: Secondary | ICD-10-CM | POA: Diagnosis not present

## 2017-03-12 DIAGNOSIS — H01024 Squamous blepharitis left upper eyelid: Secondary | ICD-10-CM | POA: Diagnosis not present

## 2017-03-31 DIAGNOSIS — G4700 Insomnia, unspecified: Secondary | ICD-10-CM | POA: Diagnosis not present

## 2017-03-31 DIAGNOSIS — Z6822 Body mass index (BMI) 22.0-22.9, adult: Secondary | ICD-10-CM | POA: Diagnosis not present

## 2017-03-31 DIAGNOSIS — Z299 Encounter for prophylactic measures, unspecified: Secondary | ICD-10-CM | POA: Diagnosis not present

## 2017-03-31 DIAGNOSIS — R002 Palpitations: Secondary | ICD-10-CM | POA: Diagnosis not present

## 2017-03-31 DIAGNOSIS — M810 Age-related osteoporosis without current pathological fracture: Secondary | ICD-10-CM | POA: Diagnosis not present

## 2017-03-31 DIAGNOSIS — E039 Hypothyroidism, unspecified: Secondary | ICD-10-CM | POA: Diagnosis not present

## 2017-03-31 DIAGNOSIS — I447 Left bundle-branch block, unspecified: Secondary | ICD-10-CM | POA: Diagnosis not present

## 2017-04-24 DIAGNOSIS — M818 Other osteoporosis without current pathological fracture: Secondary | ICD-10-CM | POA: Diagnosis not present

## 2017-04-29 ENCOUNTER — Encounter: Payer: Self-pay | Admitting: Gynecology

## 2017-05-26 ENCOUNTER — Telehealth: Payer: Self-pay | Admitting: Gynecology

## 2017-05-26 NOTE — Telephone Encounter (Signed)
prolia due 06/07/17 Complete TF 12/04/16 Calcium at PCP need report

## 2017-05-28 NOTE — Telephone Encounter (Signed)
Called Dr. Woody Seller office Calcium 9.5  Lab date 11/13/16 per Fraser Din ( front desk )  There fax is down  They will mail [place] results will scan in system when it arrives    Deductible 185 ($0MET) 20% Administraction fee= 20% of 45=9  Total cost to pt $194  Called pt left voice mail

## 2017-05-28 NOTE — Telephone Encounter (Signed)
Pt called back. Gave her estimated cost of $194 states will pay same day. Appt scheduled 06/10/17 @ 12:00

## 2017-06-02 DIAGNOSIS — Z1231 Encounter for screening mammogram for malignant neoplasm of breast: Secondary | ICD-10-CM | POA: Diagnosis not present

## 2017-06-04 ENCOUNTER — Encounter: Payer: Self-pay | Admitting: Gynecology

## 2017-06-04 ENCOUNTER — Other Ambulatory Visit: Payer: Self-pay | Admitting: Gynecology

## 2017-06-10 ENCOUNTER — Encounter: Payer: Self-pay | Admitting: Gynecology

## 2017-06-10 ENCOUNTER — Ambulatory Visit (INDEPENDENT_AMBULATORY_CARE_PROVIDER_SITE_OTHER): Payer: Medicare Other | Admitting: Gynecology

## 2017-06-10 DIAGNOSIS — M810 Age-related osteoporosis without current pathological fracture: Secondary | ICD-10-CM | POA: Diagnosis not present

## 2017-06-10 MED ORDER — DENOSUMAB 60 MG/ML ~~LOC~~ SOLN
60.0000 mg | Freq: Once | SUBCUTANEOUS | Status: AC
  Administered 2017-06-10: 60 mg via SUBCUTANEOUS

## 2017-06-10 NOTE — Telephone Encounter (Signed)
Prolia given 05/10/2018 Next injection 12/09/17

## 2017-06-12 DIAGNOSIS — M549 Dorsalgia, unspecified: Secondary | ICD-10-CM | POA: Diagnosis not present

## 2017-07-01 DIAGNOSIS — Z299 Encounter for prophylactic measures, unspecified: Secondary | ICD-10-CM | POA: Diagnosis not present

## 2017-07-01 DIAGNOSIS — S92902A Unspecified fracture of left foot, initial encounter for closed fracture: Secondary | ICD-10-CM | POA: Diagnosis not present

## 2017-07-01 DIAGNOSIS — M79672 Pain in left foot: Secondary | ICD-10-CM | POA: Diagnosis not present

## 2017-07-01 DIAGNOSIS — E039 Hypothyroidism, unspecified: Secondary | ICD-10-CM | POA: Diagnosis not present

## 2017-07-01 DIAGNOSIS — Z681 Body mass index (BMI) 19 or less, adult: Secondary | ICD-10-CM | POA: Diagnosis not present

## 2017-07-01 DIAGNOSIS — F419 Anxiety disorder, unspecified: Secondary | ICD-10-CM | POA: Diagnosis not present

## 2017-07-01 DIAGNOSIS — E7800 Pure hypercholesterolemia, unspecified: Secondary | ICD-10-CM | POA: Diagnosis not present

## 2017-07-23 DIAGNOSIS — M19072 Primary osteoarthritis, left ankle and foot: Secondary | ICD-10-CM | POA: Diagnosis not present

## 2017-07-23 DIAGNOSIS — M79672 Pain in left foot: Secondary | ICD-10-CM | POA: Diagnosis not present

## 2017-08-20 DIAGNOSIS — E7800 Pure hypercholesterolemia, unspecified: Secondary | ICD-10-CM | POA: Diagnosis not present

## 2017-08-20 DIAGNOSIS — Z6821 Body mass index (BMI) 21.0-21.9, adult: Secondary | ICD-10-CM | POA: Diagnosis not present

## 2017-08-20 DIAGNOSIS — K219 Gastro-esophageal reflux disease without esophagitis: Secondary | ICD-10-CM | POA: Diagnosis not present

## 2017-08-20 DIAGNOSIS — K5900 Constipation, unspecified: Secondary | ICD-10-CM | POA: Diagnosis not present

## 2017-08-20 DIAGNOSIS — Z299 Encounter for prophylactic measures, unspecified: Secondary | ICD-10-CM | POA: Diagnosis not present

## 2017-08-20 DIAGNOSIS — F419 Anxiety disorder, unspecified: Secondary | ICD-10-CM | POA: Diagnosis not present

## 2017-08-20 DIAGNOSIS — E039 Hypothyroidism, unspecified: Secondary | ICD-10-CM | POA: Diagnosis not present

## 2017-08-22 DIAGNOSIS — M79672 Pain in left foot: Secondary | ICD-10-CM | POA: Diagnosis not present

## 2017-08-22 DIAGNOSIS — M19272 Secondary osteoarthritis, left ankle and foot: Secondary | ICD-10-CM | POA: Diagnosis not present

## 2017-08-26 DIAGNOSIS — Z85828 Personal history of other malignant neoplasm of skin: Secondary | ICD-10-CM | POA: Diagnosis not present

## 2017-08-26 DIAGNOSIS — L309 Dermatitis, unspecified: Secondary | ICD-10-CM | POA: Diagnosis not present

## 2017-08-26 DIAGNOSIS — L570 Actinic keratosis: Secondary | ICD-10-CM | POA: Diagnosis not present

## 2017-09-25 DIAGNOSIS — R351 Nocturia: Secondary | ICD-10-CM | POA: Diagnosis not present

## 2017-09-25 DIAGNOSIS — N952 Postmenopausal atrophic vaginitis: Secondary | ICD-10-CM | POA: Diagnosis not present

## 2017-09-25 DIAGNOSIS — N3941 Urge incontinence: Secondary | ICD-10-CM | POA: Diagnosis not present

## 2017-10-08 ENCOUNTER — Telehealth: Payer: Self-pay | Admitting: *Deleted

## 2017-10-08 DIAGNOSIS — M810 Age-related osteoporosis without current pathological fracture: Secondary | ICD-10-CM

## 2017-10-08 NOTE — Telephone Encounter (Addendum)
Deductible $185 ($185 met)  OOP MAX  Value (Amount Met)  Annual exam  DATE: 12/09/17 TF  Calcium  9.0        Date 11/14/17  Upcoming dental procedures NO  Prior Authorization needed NO  Pt estimated Cost : $0  Prolia appt 12/09/17 same day as annual      Coverage Details: This is a Medicare [place] and it covers the Medicare Part B co-insurance and 80% of the excess charges. This plan does not cover the Medicare Part B deductible.

## 2017-11-12 DIAGNOSIS — E039 Hypothyroidism, unspecified: Secondary | ICD-10-CM | POA: Diagnosis not present

## 2017-11-12 DIAGNOSIS — Z1331 Encounter for screening for depression: Secondary | ICD-10-CM | POA: Diagnosis not present

## 2017-11-12 DIAGNOSIS — Z1211 Encounter for screening for malignant neoplasm of colon: Secondary | ICD-10-CM | POA: Diagnosis not present

## 2017-11-12 DIAGNOSIS — Z0000 Encounter for general adult medical examination without abnormal findings: Secondary | ICD-10-CM | POA: Diagnosis not present

## 2017-11-12 DIAGNOSIS — R5383 Other fatigue: Secondary | ICD-10-CM | POA: Diagnosis not present

## 2017-11-12 DIAGNOSIS — Z1339 Encounter for screening examination for other mental health and behavioral disorders: Secondary | ICD-10-CM | POA: Diagnosis not present

## 2017-11-12 DIAGNOSIS — Z7189 Other specified counseling: Secondary | ICD-10-CM | POA: Diagnosis not present

## 2017-11-12 DIAGNOSIS — Z299 Encounter for prophylactic measures, unspecified: Secondary | ICD-10-CM | POA: Diagnosis not present

## 2017-11-12 DIAGNOSIS — E7800 Pure hypercholesterolemia, unspecified: Secondary | ICD-10-CM | POA: Diagnosis not present

## 2017-11-12 DIAGNOSIS — Z6822 Body mass index (BMI) 22.0-22.9, adult: Secondary | ICD-10-CM | POA: Diagnosis not present

## 2017-11-13 DIAGNOSIS — E039 Hypothyroidism, unspecified: Secondary | ICD-10-CM | POA: Diagnosis not present

## 2017-11-13 DIAGNOSIS — R5383 Other fatigue: Secondary | ICD-10-CM | POA: Diagnosis not present

## 2017-11-13 DIAGNOSIS — Z79899 Other long term (current) drug therapy: Secondary | ICD-10-CM | POA: Diagnosis not present

## 2017-11-13 DIAGNOSIS — E7800 Pure hypercholesterolemia, unspecified: Secondary | ICD-10-CM | POA: Diagnosis not present

## 2017-12-09 ENCOUNTER — Ambulatory Visit (INDEPENDENT_AMBULATORY_CARE_PROVIDER_SITE_OTHER): Payer: Medicare Other | Admitting: Gynecology

## 2017-12-09 ENCOUNTER — Encounter: Payer: Medicare Other | Admitting: Gynecology

## 2017-12-09 ENCOUNTER — Encounter: Payer: Self-pay | Admitting: Gynecology

## 2017-12-09 VITALS — BP 118/76 | Ht 65.0 in | Wt 135.0 lb

## 2017-12-09 DIAGNOSIS — M810 Age-related osteoporosis without current pathological fracture: Secondary | ICD-10-CM | POA: Diagnosis not present

## 2017-12-09 DIAGNOSIS — R21 Rash and other nonspecific skin eruption: Secondary | ICD-10-CM | POA: Diagnosis not present

## 2017-12-09 DIAGNOSIS — N952 Postmenopausal atrophic vaginitis: Secondary | ICD-10-CM

## 2017-12-09 DIAGNOSIS — Z01411 Encounter for gynecological examination (general) (routine) with abnormal findings: Secondary | ICD-10-CM

## 2017-12-09 DIAGNOSIS — M818 Other osteoporosis without current pathological fracture: Secondary | ICD-10-CM

## 2017-12-09 MED ORDER — DENOSUMAB 60 MG/ML ~~LOC~~ SOSY
60.0000 mg | PREFILLED_SYRINGE | Freq: Once | SUBCUTANEOUS | Status: AC
  Administered 2017-12-09: 60 mg via SUBCUTANEOUS

## 2017-12-09 NOTE — Addendum Note (Signed)
Addended by: Nelva Nay on: 12/09/2017 12:58 PM   Modules accepted: Orders

## 2017-12-09 NOTE — Patient Instructions (Signed)
Continue with your Prolia shots every 6 months.  Follow-up in 1 year

## 2017-12-09 NOTE — Progress Notes (Signed)
    Chanita Boden Nill-Cook February 11, 1926 [id number]        84 y.o.  [id number] for breast and pelvic exam.  Also complaining of a migratory skin rash.  Has been going on for months.  Saw her local dermatologist to thought it might be due to detergent or skin lotions.  Not pruritic or weeping.  Occurs over her chest abdomen and back but changes on a daily basis.  Past medical history,surgical history, problem list, medications, allergies, family history and social history were all reviewed and documented as reviewed in the EPIC chart.  ROS:  Performed with pertinent positives and negatives included in the history, assessment and plan.   Additional significant findings : None   Exam: [name] assistant Vitals:   12/09/17 1034  BP: 118/76  Weight: 135 lb (61.2 kg)  Height: 5\' 5"  (1.651 m)   Body mass index is 22.47 kg/m.  General appearance:  Normal affect, orientation and appearance. Skin: Grossly with faint erythematous rash lower right back HEENT: Without gross lesions.  No cervical or supraclavicular adenopathy. Thyroid normal.  Lungs:  Clear without wheezing, rales or rhonchi Cardiac: RR, without RMG Abdominal:  Soft, nontender, without masses, guarding, rebound, organomegaly or hernia Breasts:  Examined lying and sitting without masses, retractions, discharge or axillary adenopathy. Pelvic:  Ext, BUS, Vagina: With atrophic changes  Adnexa: Without masses or tenderness    Anus and perineum: Normal   Rectovaginal: Normal sphincter tone without palpated masses or tenderness.    Assessment/Plan:  84 y.o. [id number] female for breast and pelvic exam.   1. Skin rash.  Very faint erythematous area.  Migratory by her history.  Not pruritic or weeping.  Saw a dermatologist who thought it might be a reaction to an allergen.  Question whether it may be atypical migratory type hives.  Recommended follow-up with the dermatologist.  Also she may want to try a very low dose antihistamine like  across the counter Benadryl 12.5 mg at bedtime.  Caution as far as sedation reviewed.  Also may try OTC Tagamet to see if that would help. 2. Osteoporosis.  DEXA 04/2017 T score -2.4.  Previously T score 2.7.  On Prolia for years.  Doing well with this.  Will continue on Prolia now.  Repeat DEXA at 2-year interval. 3. Mammography 05/2017.  Breast exam normal today.  Continue with annual mammography when due. 4. Colonoscopy 2011.  Repeat at their recommended interval. 5. Pap smear 2011.  No Pap smear done today.  No history of abnormal Pap smears.  Per current screening guidelines will stop screening based on age and hysterectomy history. 6. Health maintenance.  No routine lab work done as patient does this elsewhere.  Follow-up for her Prolia every 6 months.  Follow-up for annual exam in 1 year.  Additional 10 minutes time in excess of her breast and pelvic exam was spent in direct face to face counseling and coordination of care in regards to her skin rash with recommended treatment options.    [name] MD, 10:59 AM 12/09/2017

## 2017-12-12 NOTE — Telephone Encounter (Signed)
Prolia Given 12/09/17 Next injection 06/12/2018

## 2017-12-26 DIAGNOSIS — Z713 Dietary counseling and surveillance: Secondary | ICD-10-CM | POA: Diagnosis not present

## 2017-12-26 DIAGNOSIS — F320 Major depressive disorder, single episode, mild: Secondary | ICD-10-CM | POA: Diagnosis not present

## 2017-12-26 DIAGNOSIS — E039 Hypothyroidism, unspecified: Secondary | ICD-10-CM | POA: Diagnosis not present

## 2017-12-26 DIAGNOSIS — Z6822 Body mass index (BMI) 22.0-22.9, adult: Secondary | ICD-10-CM | POA: Diagnosis not present

## 2017-12-26 DIAGNOSIS — Z299 Encounter for prophylactic measures, unspecified: Secondary | ICD-10-CM | POA: Diagnosis not present

## 2018-01-16 DIAGNOSIS — M19272 Secondary osteoarthritis, left ankle and foot: Secondary | ICD-10-CM | POA: Diagnosis not present

## 2018-01-16 DIAGNOSIS — M79672 Pain in left foot: Secondary | ICD-10-CM | POA: Diagnosis not present

## 2018-02-13 DIAGNOSIS — F320 Major depressive disorder, single episode, mild: Secondary | ICD-10-CM | POA: Diagnosis not present

## 2018-02-13 DIAGNOSIS — E7800 Pure hypercholesterolemia, unspecified: Secondary | ICD-10-CM | POA: Diagnosis not present

## 2018-02-13 DIAGNOSIS — R42 Dizziness and giddiness: Secondary | ICD-10-CM | POA: Diagnosis not present

## 2018-02-13 DIAGNOSIS — Z789 Other specified health status: Secondary | ICD-10-CM | POA: Diagnosis not present

## 2018-02-13 DIAGNOSIS — Z6822 Body mass index (BMI) 22.0-22.9, adult: Secondary | ICD-10-CM | POA: Diagnosis not present

## 2018-02-13 DIAGNOSIS — K219 Gastro-esophageal reflux disease without esophagitis: Secondary | ICD-10-CM | POA: Diagnosis not present

## 2018-02-13 DIAGNOSIS — Z299 Encounter for prophylactic measures, unspecified: Secondary | ICD-10-CM | POA: Diagnosis not present

## 2018-02-17 ENCOUNTER — Ambulatory Visit (INDEPENDENT_AMBULATORY_CARE_PROVIDER_SITE_OTHER): Payer: Medicare Other | Admitting: Cardiovascular Disease

## 2018-02-17 ENCOUNTER — Encounter: Payer: Self-pay | Admitting: Cardiovascular Disease

## 2018-02-17 VITALS — BP 112/76 | HR 65 | Ht 65.0 in | Wt 134.0 lb

## 2018-02-17 DIAGNOSIS — Z79899 Other long term (current) drug therapy: Secondary | ICD-10-CM

## 2018-02-17 DIAGNOSIS — I341 Nonrheumatic mitral (valve) prolapse: Secondary | ICD-10-CM | POA: Diagnosis not present

## 2018-02-17 DIAGNOSIS — R0789 Other chest pain: Secondary | ICD-10-CM

## 2018-02-17 DIAGNOSIS — R002 Palpitations: Secondary | ICD-10-CM | POA: Diagnosis not present

## 2018-02-17 DIAGNOSIS — I447 Left bundle-branch block, unspecified: Secondary | ICD-10-CM | POA: Diagnosis not present

## 2018-02-17 DIAGNOSIS — E039 Hypothyroidism, unspecified: Secondary | ICD-10-CM

## 2018-02-17 MED ORDER — METOPROLOL TARTRATE 25 MG PO TABS: 12.5000 mg | ORAL_TABLET | ORAL | 3 refills | Status: DC | PRN
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
## 2018-02-17 NOTE — Patient Instructions (Addendum)
Medication Instructions:  Take metoprolol tartrate (Lopressor) 12.5mg  (1/2 tablet) AS NEEDED for palpitations  Labwork: Today (TSH, CMET, CBC, Mag)  Testing/Procedures: Your physician has requested that you have an echocardiogram at [facility]. Echocardiography is a painless test that uses sound waves to create images of your heart. It provides your doctor with information about the size and shape of your heart and how well your heart's chambers and valves are working. This procedure takes approximately one hour. There are no restrictions for this procedure.   Follow-Up: With Dr. Domenic Polite in [place] in 6 weeks  Any Other Special Instructions Will Be Listed Below (If Applicable).     If you need a refill on your cardiac medications before your next appointment, please call your pharmacy.

## 2018-02-18 LAB — CBC
HEMATOCRIT: 42.9 % (ref 34.0–46.6)
Hemoglobin: 14.4 g/dL (ref 11.1–15.9)
MCH: 32.7 pg (ref 26.6–33.0)
MCHC: 33.6 g/dL (ref 31.5–35.7)
MCV: 98 fL — ABNORMAL HIGH (ref 79–97)
PLATELETS: 142 10*3/uL — AB (ref 150–450)
RBC: 4.4 x10E6/uL (ref 3.77–5.28)
RDW: 13.6 % (ref 12.3–15.4)
WBC: 4.1 10*3/uL (ref 3.4–10.8)

## 2018-02-18 LAB — COMPREHENSIVE METABOLIC PANEL
ALK PHOS: 53 IU/L (ref 39–117)
ALT: 13 IU/L (ref 0–32)
AST: 23 IU/L (ref 0–40)
Albumin/Globulin Ratio: 2.3 — ABNORMAL HIGH (ref 1.2–2.2)
Albumin: 4.4 g/dL (ref 3.5–4.7)
BUN / CREAT RATIO: 15 (ref 12–28)
BUN: 15 mg/dL (ref 8–27)
Bilirubin Total: 0.4 mg/dL (ref 0.0–1.2)
CALCIUM: 9.6 mg/dL (ref 8.7–10.3)
CO2: 26 mmol/L (ref 20–29)
CREATININE: 1 mg/dL (ref 0.57–1.00)
Chloride: 102 mmol/L (ref 96–106)
GFR, EST AFRICAN AMERICAN: 58 mL/min/{1.73_m2} — AB (ref >59)
GFR, EST NON AFRICAN AMERICAN: 50 mL/min/{1.73_m2} — AB (ref >59)
GLOBULIN, TOTAL: 1.9 g/dL (ref 1.5–4.5)
GLUCOSE: 80 mg/dL (ref 65–99)
POTASSIUM: 4.5 mmol/L (ref 3.5–5.2)
SODIUM: 142 mmol/L (ref 134–144)
Total Protein: 6.3 g/dL (ref 6.0–8.5)

## 2018-02-18 LAB — MAGNESIUM: MAGNESIUM: 2.1 mg/dL (ref 1.6–2.3)

## 2018-02-18 LAB — TSH: TSH: 2.18 u[IU]/mL (ref 0.450–4.500)

## 2018-02-19 ENCOUNTER — Encounter: Payer: Self-pay | Admitting: Cardiovascular Disease

## 2018-02-24 DIAGNOSIS — G9009 Other idiopathic peripheral autonomic neuropathy: Secondary | ICD-10-CM | POA: Diagnosis not present

## 2018-02-24 DIAGNOSIS — H81393 Other peripheral vertigo, bilateral: Secondary | ICD-10-CM | POA: Diagnosis not present

## 2018-02-24 DIAGNOSIS — I7389 Other specified peripheral vascular diseases: Secondary | ICD-10-CM | POA: Diagnosis not present

## 2018-02-25 ENCOUNTER — Other Ambulatory Visit: Payer: Self-pay | Admitting: Cardiovascular Disease

## 2018-02-25 DIAGNOSIS — I341 Nonrheumatic mitral (valve) prolapse: Secondary | ICD-10-CM

## 2018-02-25 DIAGNOSIS — I059 Rheumatic mitral valve disease, unspecified: Secondary | ICD-10-CM

## 2018-03-12 ENCOUNTER — Other Ambulatory Visit: Payer: Self-pay

## 2018-03-12 ENCOUNTER — Ambulatory Visit (INDEPENDENT_AMBULATORY_CARE_PROVIDER_SITE_OTHER): Payer: Medicare Other

## 2018-03-12 DIAGNOSIS — I341 Nonrheumatic mitral (valve) prolapse: Secondary | ICD-10-CM | POA: Diagnosis not present

## 2018-03-12 DIAGNOSIS — I059 Rheumatic mitral valve disease, unspecified: Secondary | ICD-10-CM | POA: Diagnosis not present

## 2018-03-17 DIAGNOSIS — Z713 Dietary counseling and surveillance: Secondary | ICD-10-CM | POA: Diagnosis not present

## 2018-03-17 DIAGNOSIS — Z299 Encounter for prophylactic measures, unspecified: Secondary | ICD-10-CM | POA: Diagnosis not present

## 2018-03-17 DIAGNOSIS — Z6820 Body mass index (BMI) 20.0-20.9, adult: Secondary | ICD-10-CM | POA: Diagnosis not present

## 2018-03-17 DIAGNOSIS — R42 Dizziness and giddiness: Secondary | ICD-10-CM | POA: Diagnosis not present

## 2018-03-25 DIAGNOSIS — M19272 Secondary osteoarthritis, left ankle and foot: Secondary | ICD-10-CM | POA: Diagnosis not present

## 2018-03-25 DIAGNOSIS — M2042 Other hammer toe(s) (acquired), left foot: Secondary | ICD-10-CM | POA: Diagnosis not present

## 2018-03-25 DIAGNOSIS — M7742 Metatarsalgia, left foot: Secondary | ICD-10-CM | POA: Diagnosis not present

## 2018-04-01 DIAGNOSIS — M19272 Secondary osteoarthritis, left ankle and foot: Secondary | ICD-10-CM | POA: Diagnosis not present

## 2018-04-01 DIAGNOSIS — M6281 Muscle weakness (generalized): Secondary | ICD-10-CM | POA: Diagnosis not present

## 2018-04-01 DIAGNOSIS — R2689 Other abnormalities of gait and mobility: Secondary | ICD-10-CM | POA: Diagnosis not present

## 2018-04-03 DIAGNOSIS — M19272 Secondary osteoarthritis, left ankle and foot: Secondary | ICD-10-CM | POA: Diagnosis not present

## 2018-04-03 DIAGNOSIS — M6281 Muscle weakness (generalized): Secondary | ICD-10-CM | POA: Diagnosis not present

## 2018-04-03 DIAGNOSIS — R2689 Other abnormalities of gait and mobility: Secondary | ICD-10-CM | POA: Diagnosis not present

## 2018-04-06 ENCOUNTER — Encounter: Payer: Self-pay | Admitting: Cardiology

## 2018-04-06 DIAGNOSIS — M6281 Muscle weakness (generalized): Secondary | ICD-10-CM | POA: Diagnosis not present

## 2018-04-06 DIAGNOSIS — M19272 Secondary osteoarthritis, left ankle and foot: Secondary | ICD-10-CM | POA: Diagnosis not present

## 2018-04-06 DIAGNOSIS — R2689 Other abnormalities of gait and mobility: Secondary | ICD-10-CM | POA: Diagnosis not present

## 2018-04-06 NOTE — Progress Notes (Signed)
Cardiology Office Note  Date: 04/08/2018   ID: AOKI WEDEMEYER, DOB Oct 20, 1925, MRN [id number]  PCP: Glenda Chroman, MD  Evaluating Cardiologist: Rozann Lesches, MD   Chief Complaint  Patient presents with  . Cardiac follow-up    History of Present Illness: Debra Richards is an 84 y.o. female former patient of Dr. Claiborne Billings, now establishing follow-up in the [facility] office.  This is our first meeting today.  I reviewed records and updated the chart.  She has a history of palpitations, treated with as needed Lopressor.  She states that this has been relatively "quiet" recently, she has not used Lopressor at all.  Cardiac monitor per [facility] in 2014 demonstrated sinus rhythm with PACs.  No definite arrhythmia.  She has a reported history of mitral valve prolapse.  Recent follow-up echocardiogram showed mitral annular calcification but no description of prolapse.  She does have a relatively chronic, small pericardial effusion.  LVEF normal range.  I discussed the results with her.  She lives in [place].  She has a son with an [employer] business, her daughter is a Marine scientist at the [employer].  She still remains fairly functional, lives in her own home and drives.  Past Medical History:  Diagnosis Date  . GERD (gastroesophageal reflux disease)   . History of kidney stones   . Hypothyroidism   . LBBB (left bundle branch block)   . MVP (mitral valve prolapse)   . Osteoporosis   . Palpitations   . RA (rheumatoid arthritis) ([place])   . Right ureteral stone   . Rosacea     Past Surgical History:  Procedure Laterality Date  . CATARACT EXTRACTION W/ INTRAOCULAR LENS  IMPLANT, BILATERAL  4 yrs ago   both eyes  . CHOLECYSTECTOMY  2005  . EXTRACORPOREAL SHOCK WAVE LITHOTRIPSY  09-24-10   and 1990  . Femur fx    . HIP ARTHROPLASTY Right 10/12/2012   Procedure: ARTHROPLASTY BIPOLAR HIP;  Surgeon: Gearlean Alf, MD;  [facility]: [facility];  Service: Orthopedics;  Laterality: Right;  . KNEE  ARTHROSCOPY  04/17/2011, right   Procedure: ARTHROSCOPY KNEE;  Surgeon: Gearlean Alf;  Location: [place];  Service: Orthopedics;  Laterality: Right;  WITH LATERAL MENISCAL DEBRIDEMENT  . MOLES EXCISED  2012   on nose  . Sternum Fx    . TOTAL KNEE ARTHROPLASTY  03/23/2012   Procedure: TOTAL KNEE ARTHROPLASTY;  Surgeon: Gearlean Alf, MD;  [facility];  Service: Orthopedics;  Laterality: Right;  Marland Kitchen VAGINAL HYSTERECTOMY  1970   Leiomyomata    Current Outpatient Medications  Medication Sig Dispense Refill  . ASPIRIN 81 PO Take 1 tablet by mouth daily.     . cholecalciferol (VITAMIN D) 1000 units tablet Take 1,000 Units by mouth every morning.    . denosumab (PROLIA) 60 MG/ML SOLN injection Inject 60 mg into the skin every 6 (six) months. Administer in upper arm, thigh, or abdomen    . levothyroxine (SYNTHROID, LEVOTHROID) 75 MCG tablet Take 75 mcg by mouth daily before breakfast.    . metoprolol tartrate (LOPRESSOR) 25 MG tablet Take 0.5 tablets (12.5 mg total) by mouth as needed (palpitations). 30 tablet 3  . omeprazole (PRILOSEC) 40 MG capsule Take 40 mg by mouth daily.    . vitamin E 400 UNIT capsule Take 400 Units by mouth every morning.      No current facility-administered medications for this visit.    Allergies:  Contrast media [iodinated  diagnostic agents]; Levaquin [levofloxacin in d5w]; Penicillins; and Ciprofloxacin   Social History: The patient  reports that she has never smoked. She has never used smokeless tobacco. She reports that she does not drink alcohol or use drugs.   ROS:  Please see the history of present illness. Otherwise, complete review of systems is positive for hearing loss, arthritic pains.  All other systems are reviewed and negative.   Physical Exam: VS:  BP 122/80   Pulse 68   Ht 5\' 5"  (1.651 m)   Wt 134 lb (60.8 kg)   SpO2 96%   BMI 22.30 kg/m , BMI Body mass index is 22.3 kg/m.  Wt Readings from Last 3 Encounters:    04/08/18 134 lb (60.8 kg)  02/17/18 134 lb (60.8 kg)  12/09/17 135 lb (61.2 kg)    General: Pleasant elderly woman, appears comfortable at rest. HEENT: Conjunctiva and lids normal, oropharynx clear. Neck: Supple, no elevated JVP or carotid bruits, no thyromegaly. Lungs: Clear to auscultation, nonlabored breathing at rest. Cardiac: Regular rate and rhythm, no S3, soft systolic murmur. Abdomen: Soft, nontender, bowel sounds present. Extremities: No pitting edema, distal pulses 2+. Skin: Warm and dry. Musculoskeletal: No kyphosis. Neuropsychiatric: Alert and oriented x3, affect grossly appropriate.  ECG: I personally reviewed the tracing from 02/17/2018 which showed sinus rhythm with old left bundle branch block.  Recent Labwork: 02/17/2018: ALT 13; AST 23; BUN 15; Creatinine, Ser 1.00; Hemoglobin 14.4; Magnesium 2.1; Platelets 142; Potassium 4.5; Sodium 142; TSH 2.180   Other Studies Reviewed Today:  Echocardiogram 03/12/2018: Study Conclusions  - Left ventricle: The cavity size was normal. Wall thickness was   normal. Systolic function was normal. The estimated ejection   fraction was in the range of 60% to 65%. Wall motion was normal;   there were no regional wall motion abnormalities. Doppler   parameters are consistent with abnormal left ventricular   relaxation (grade 1 diastolic dysfunction). - Aortic valve: There was trivial regurgitation. - Mitral valve: There was mild regurgitation. - Pericardium, extracardiac: There was a small pericardial   effusion. Features were not consistent with tamponade physiology.  Assessment and Plan:  1.  History of palpitations, no documented arrhythmias based on previous work-up although PACs demonstrated.  She has as needed Lopressor available but has not had to use it.  She does not report any recent progression in symptoms, no dizziness or syncope.  2.  Reported history of mitral valve prolapse.  Recent echocardiogram did not  demonstrate frank prolapse and shows only mild mitral regurgitation.  3.  Chronic, small pericardial effusion, asymptomatic.  4.  Hypothyroidism, on Synthroid with follow-up per Dr. Woody Seller.  Recent TSH normal in September.  Current medicines were reviewed with the patient today.  Disposition: Follow-up in 1 year, sooner if needed.  Signed, Satira Sark, MD, [facility] 04/08/2018 11:04 AM    [facility] [place], [place], [place] [address] Phone: [phone]; Fax: [phone]

## 2018-04-08 ENCOUNTER — Ambulatory Visit (INDEPENDENT_AMBULATORY_CARE_PROVIDER_SITE_OTHER): Payer: Medicare Other | Admitting: Cardiology

## 2018-04-08 ENCOUNTER — Encounter: Payer: Self-pay | Admitting: Cardiology

## 2018-04-08 VITALS — BP 122/80 | HR 68 | Ht 65.0 in | Wt 134.0 lb

## 2018-04-08 DIAGNOSIS — I341 Nonrheumatic mitral (valve) prolapse: Secondary | ICD-10-CM | POA: Diagnosis not present

## 2018-04-08 DIAGNOSIS — R2689 Other abnormalities of gait and mobility: Secondary | ICD-10-CM | POA: Diagnosis not present

## 2018-04-08 DIAGNOSIS — M6281 Muscle weakness (generalized): Secondary | ICD-10-CM | POA: Diagnosis not present

## 2018-04-08 DIAGNOSIS — E039 Hypothyroidism, unspecified: Secondary | ICD-10-CM

## 2018-04-08 DIAGNOSIS — M19272 Secondary osteoarthritis, left ankle and foot: Secondary | ICD-10-CM | POA: Diagnosis not present

## 2018-04-08 DIAGNOSIS — I313 Pericardial effusion (noninflammatory): Secondary | ICD-10-CM

## 2018-04-08 DIAGNOSIS — R002 Palpitations: Secondary | ICD-10-CM

## 2018-04-08 DIAGNOSIS — I3139 Other pericardial effusion (noninflammatory): Secondary | ICD-10-CM

## 2018-04-08 NOTE — Patient Instructions (Addendum)

## 2018-04-10 DIAGNOSIS — M19272 Secondary osteoarthritis, left ankle and foot: Secondary | ICD-10-CM | POA: Diagnosis not present

## 2018-04-10 DIAGNOSIS — R2689 Other abnormalities of gait and mobility: Secondary | ICD-10-CM | POA: Diagnosis not present

## 2018-04-10 DIAGNOSIS — M6281 Muscle weakness (generalized): Secondary | ICD-10-CM | POA: Diagnosis not present

## 2018-04-13 DIAGNOSIS — R350 Frequency of micturition: Secondary | ICD-10-CM | POA: Diagnosis not present

## 2018-04-13 DIAGNOSIS — Z299 Encounter for prophylactic measures, unspecified: Secondary | ICD-10-CM | POA: Diagnosis not present

## 2018-04-13 DIAGNOSIS — M19272 Secondary osteoarthritis, left ankle and foot: Secondary | ICD-10-CM | POA: Diagnosis not present

## 2018-04-13 DIAGNOSIS — R2689 Other abnormalities of gait and mobility: Secondary | ICD-10-CM | POA: Diagnosis not present

## 2018-04-13 DIAGNOSIS — M6281 Muscle weakness (generalized): Secondary | ICD-10-CM | POA: Diagnosis not present

## 2018-04-13 DIAGNOSIS — N390 Urinary tract infection, site not specified: Secondary | ICD-10-CM | POA: Diagnosis not present

## 2018-04-13 DIAGNOSIS — Z6821 Body mass index (BMI) 21.0-21.9, adult: Secondary | ICD-10-CM | POA: Diagnosis not present

## 2018-04-15 DIAGNOSIS — M6281 Muscle weakness (generalized): Secondary | ICD-10-CM | POA: Diagnosis not present

## 2018-04-15 DIAGNOSIS — M19272 Secondary osteoarthritis, left ankle and foot: Secondary | ICD-10-CM | POA: Diagnosis not present

## 2018-04-15 DIAGNOSIS — R2689 Other abnormalities of gait and mobility: Secondary | ICD-10-CM | POA: Diagnosis not present

## 2018-04-17 DIAGNOSIS — M6281 Muscle weakness (generalized): Secondary | ICD-10-CM | POA: Diagnosis not present

## 2018-04-17 DIAGNOSIS — M19272 Secondary osteoarthritis, left ankle and foot: Secondary | ICD-10-CM | POA: Diagnosis not present

## 2018-04-17 DIAGNOSIS — R2689 Other abnormalities of gait and mobility: Secondary | ICD-10-CM | POA: Diagnosis not present

## 2018-04-20 DIAGNOSIS — R2689 Other abnormalities of gait and mobility: Secondary | ICD-10-CM | POA: Diagnosis not present

## 2018-04-20 DIAGNOSIS — M19272 Secondary osteoarthritis, left ankle and foot: Secondary | ICD-10-CM | POA: Diagnosis not present

## 2018-04-20 DIAGNOSIS — M6281 Muscle weakness (generalized): Secondary | ICD-10-CM | POA: Diagnosis not present

## 2018-04-22 DIAGNOSIS — R2689 Other abnormalities of gait and mobility: Secondary | ICD-10-CM | POA: Diagnosis not present

## 2018-04-22 DIAGNOSIS — M19272 Secondary osteoarthritis, left ankle and foot: Secondary | ICD-10-CM | POA: Diagnosis not present

## 2018-04-22 DIAGNOSIS — M6281 Muscle weakness (generalized): Secondary | ICD-10-CM | POA: Diagnosis not present

## 2018-04-29 DIAGNOSIS — Z6821 Body mass index (BMI) 21.0-21.9, adult: Secondary | ICD-10-CM | POA: Diagnosis not present

## 2018-04-29 DIAGNOSIS — Z299 Encounter for prophylactic measures, unspecified: Secondary | ICD-10-CM | POA: Diagnosis not present

## 2018-04-29 DIAGNOSIS — J029 Acute pharyngitis, unspecified: Secondary | ICD-10-CM | POA: Diagnosis not present

## 2018-05-13 DIAGNOSIS — J069 Acute upper respiratory infection, unspecified: Secondary | ICD-10-CM | POA: Diagnosis not present

## 2018-05-13 DIAGNOSIS — Z299 Encounter for prophylactic measures, unspecified: Secondary | ICD-10-CM | POA: Diagnosis not present

## 2018-05-13 DIAGNOSIS — Z6821 Body mass index (BMI) 21.0-21.9, adult: Secondary | ICD-10-CM | POA: Diagnosis not present

## 2018-05-29 ENCOUNTER — Telehealth: Payer: Self-pay | Admitting: *Deleted

## 2018-05-29 NOTE — Telephone Encounter (Signed)
Prol[employer] insurance verification has been sent awaiting Summary of benefits  

## 2018-06-03 DIAGNOSIS — H40013 Open angle with borderline findings, low risk, bilateral: Secondary | ICD-10-CM | POA: Diagnosis not present

## 2018-06-03 DIAGNOSIS — H43812 Vitreous degeneration, left eye: Secondary | ICD-10-CM | POA: Diagnosis not present

## 2018-06-04 NOTE — Telephone Encounter (Addendum)
Deductible $198 (omet)    Annual exam 12/09/17 TF  Calcium 9.6            Date 02/17/18  Upcoming dental procedures NO  Prior Authorization needed NO  Pt estimated Cost $198  Appt 06/16/2018 @11 :00     Coverage Details:$185 which included ded

## 2018-06-16 ENCOUNTER — Ambulatory Visit (INDEPENDENT_AMBULATORY_CARE_PROVIDER_SITE_OTHER): Payer: Medicare Other | Admitting: Gynecology

## 2018-06-16 ENCOUNTER — Encounter: Payer: Medicare Other | Admitting: Obstetrics & Gynecology

## 2018-06-16 DIAGNOSIS — M810 Age-related osteoporosis without current pathological fracture: Secondary | ICD-10-CM

## 2018-06-16 MED ORDER — DENOSUMAB 60 MG/ML ~~LOC~~ SOSY
60.0000 mg | PREFILLED_SYRINGE | Freq: Once | SUBCUTANEOUS | Status: AC
  Administered 2018-06-16: 60 mg via SUBCUTANEOUS

## 2018-06-18 DIAGNOSIS — Z299 Encounter for prophylactic measures, unspecified: Secondary | ICD-10-CM | POA: Diagnosis not present

## 2018-06-18 DIAGNOSIS — Z789 Other specified health status: Secondary | ICD-10-CM | POA: Diagnosis not present

## 2018-06-18 DIAGNOSIS — Z6826 Body mass index (BMI) 26.0-26.9, adult: Secondary | ICD-10-CM | POA: Diagnosis not present

## 2018-06-18 DIAGNOSIS — R252 Cramp and spasm: Secondary | ICD-10-CM | POA: Diagnosis not present

## 2018-07-03 ENCOUNTER — Ambulatory Visit (INDEPENDENT_AMBULATORY_CARE_PROVIDER_SITE_OTHER): Payer: Medicare Other | Admitting: Urology

## 2018-07-03 DIAGNOSIS — N3941 Urge incontinence: Secondary | ICD-10-CM

## 2018-07-03 DIAGNOSIS — R351 Nocturia: Secondary | ICD-10-CM | POA: Diagnosis not present

## 2018-07-03 DIAGNOSIS — Z87442 Personal history of urinary calculi: Secondary | ICD-10-CM

## 2018-07-03 NOTE — Telephone Encounter (Signed)
PROLIA GIVEN 06/16/2018 NEXT INJECTION 12/16/2018

## 2018-07-06 DIAGNOSIS — Z1231 Encounter for screening mammogram for malignant neoplasm of breast: Secondary | ICD-10-CM | POA: Diagnosis not present

## 2018-07-29 DIAGNOSIS — M5136 Other intervertebral disc degeneration, lumbar region: Secondary | ICD-10-CM | POA: Insufficient documentation

## 2018-07-29 DIAGNOSIS — M545 Low back pain: Secondary | ICD-10-CM | POA: Diagnosis not present

## 2018-08-18 DIAGNOSIS — M25551 Pain in right hip: Secondary | ICD-10-CM | POA: Diagnosis not present

## 2018-08-18 DIAGNOSIS — Z299 Encounter for prophylactic measures, unspecified: Secondary | ICD-10-CM | POA: Diagnosis not present

## 2018-08-18 DIAGNOSIS — F320 Major depressive disorder, single episode, mild: Secondary | ICD-10-CM | POA: Diagnosis not present

## 2018-08-18 DIAGNOSIS — E039 Hypothyroidism, unspecified: Secondary | ICD-10-CM | POA: Diagnosis not present

## 2018-08-18 DIAGNOSIS — M545 Low back pain: Secondary | ICD-10-CM | POA: Diagnosis not present

## 2018-08-18 DIAGNOSIS — Z6822 Body mass index (BMI) 22.0-22.9, adult: Secondary | ICD-10-CM | POA: Diagnosis not present

## 2018-08-27 ENCOUNTER — Encounter: Payer: Self-pay | Admitting: Gynecology

## 2018-09-02 DIAGNOSIS — M25559 Pain in unspecified hip: Secondary | ICD-10-CM | POA: Diagnosis not present

## 2018-09-02 DIAGNOSIS — Z299 Encounter for prophylactic measures, unspecified: Secondary | ICD-10-CM | POA: Diagnosis not present

## 2018-09-02 DIAGNOSIS — Z713 Dietary counseling and surveillance: Secondary | ICD-10-CM | POA: Diagnosis not present

## 2018-09-02 DIAGNOSIS — Z6822 Body mass index (BMI) 22.0-22.9, adult: Secondary | ICD-10-CM | POA: Diagnosis not present

## 2018-09-10 DIAGNOSIS — M25551 Pain in right hip: Secondary | ICD-10-CM | POA: Diagnosis not present

## 2018-09-18 DIAGNOSIS — M5416 Radiculopathy, lumbar region: Secondary | ICD-10-CM | POA: Insufficient documentation

## 2018-09-23 DIAGNOSIS — M5430 Sciatica, unspecified side: Secondary | ICD-10-CM | POA: Diagnosis not present

## 2018-09-23 DIAGNOSIS — Z713 Dietary counseling and surveillance: Secondary | ICD-10-CM | POA: Diagnosis not present

## 2018-09-23 DIAGNOSIS — Z299 Encounter for prophylactic measures, unspecified: Secondary | ICD-10-CM | POA: Diagnosis not present

## 2018-09-23 DIAGNOSIS — Z6822 Body mass index (BMI) 22.0-22.9, adult: Secondary | ICD-10-CM | POA: Diagnosis not present

## 2018-10-07 DIAGNOSIS — L570 Actinic keratosis: Secondary | ICD-10-CM | POA: Diagnosis not present

## 2018-10-07 DIAGNOSIS — Z85828 Personal history of other malignant neoplasm of skin: Secondary | ICD-10-CM | POA: Diagnosis not present

## 2018-10-07 DIAGNOSIS — L821 Other seborrheic keratosis: Secondary | ICD-10-CM | POA: Diagnosis not present

## 2018-11-03 ENCOUNTER — Other Ambulatory Visit: Payer: Self-pay

## 2018-11-03 ENCOUNTER — Ambulatory Visit (INDEPENDENT_AMBULATORY_CARE_PROVIDER_SITE_OTHER): Payer: Medicare Other

## 2018-11-03 ENCOUNTER — Encounter: Payer: Self-pay | Admitting: Orthopaedic Surgery

## 2018-11-03 ENCOUNTER — Ambulatory Visit (INDEPENDENT_AMBULATORY_CARE_PROVIDER_SITE_OTHER): Payer: Medicare Other | Admitting: Orthopaedic Surgery

## 2018-11-03 VITALS — BP 106/57 | HR 62 | Temp 98.2°F | Ht 65.0 in | Wt 135.0 lb

## 2018-11-03 DIAGNOSIS — M25551 Pain in right hip: Secondary | ICD-10-CM | POA: Diagnosis not present

## 2018-11-03 DIAGNOSIS — M7061 Trochanteric bursitis, right hip: Secondary | ICD-10-CM

## 2018-11-03 MED ORDER — HYDROCODONE-ACETAMINOPHEN 5-325 MG PO TABS: ORAL_TABLET | ORAL | 0 refills | Status: DC

## 2018-11-03 NOTE — Progress Notes (Signed)
Subjective:    Patient ID: Debra Richards, female    DOB: December 03, 1925, 85 y.o.   MRN: [id number]  HPI She has several month history of right lateral hip pain.  She was scheduled to be seen in [place] for this but it was cancelled several months ago because of COVID-19 problem.  She has continued to have pain.  She cannot lay on her right hip. She has no swelling, no redness.  She has some pain going to the right knee.  She had a hemi-arthroplasty done May 2014 in [place].  She has no trauma, no weakness. She has tried ice, heat, Advil with no help.  Review of Systems  Constitutional: Positive for activity change.  Musculoskeletal: Positive for arthralgias and gait problem.  All other systems reviewed and are negative.  For Review of Systems, all other systems reviewed and are negative.  The following is a summary of the past history medically, past history surgically, known current medicines, social history and family history.  This information is gathered electronically by the computer from prior information and documentation.  I review this each visit and have found including this information at this point in the chart is beneficial and informative.   Past Medical History:  Diagnosis Date  . GERD (gastroesophageal reflux disease)   . History of kidney stones   . Hypothyroidism   . LBBB (left bundle branch block)   . MVP (mitral valve prolapse)   . Osteoporosis   . Palpitations   . RA (rheumatoid arthritis) ([place])   . Right ureteral stone   . Rosacea     Past Surgical History:  Procedure Laterality Date  . CATARACT EXTRACTION W/ INTRAOCULAR LENS  IMPLANT, BILATERAL  4 yrs ago   both eyes  . CHOLECYSTECTOMY  2005  . EXTRACORPOREAL SHOCK WAVE LITHOTRIPSY  09-24-10   and 1990  . Femur fx    . HIP ARTHROPLASTY Right 10/12/2012   Procedure: ARTHROPLASTY BIPOLAR HIP;  Surgeon: Gearlean Alf, MD;  [facility];  Service: Orthopedics;  Laterality: Right;  . KNEE  ARTHROSCOPY  04/17/2011, right   Procedure: ARTHROSCOPY KNEE;  Surgeon: Gearlean Alf;  Location: [place];  Service: Orthopedics;  Laterality: Right;  WITH LATERAL MENISCAL DEBRIDEMENT  . MOLES EXCISED  2012   on nose  . Sternum Fx    . TOTAL KNEE ARTHROPLASTY  03/23/2012   Procedure: TOTAL KNEE ARTHROPLASTY;  Surgeon: Gearlean Alf, MD;  [facility];  Service: Orthopedics;  Laterality: Right;  Marland Kitchen VAGINAL HYSTERECTOMY  1970   Leiomyomata    Current Outpatient Medications on File Prior to Visit  Medication Sig Dispense Refill  . ASPIRIN 81 PO Take 1 tablet by mouth daily.     . cholecalciferol (VITAMIN D) 1000 units tablet Take 1,000 Units by mouth every morning.    . denosumab (PROLIA) 60 MG/ML SOLN injection Inject 60 mg into the skin every 6 (six) months. Administer in upper arm, thigh, or abdomen    . levothyroxine (SYNTHROID, LEVOTHROID) 75 MCG tablet Take 75 mcg by mouth daily before breakfast.    . metoprolol tartrate (LOPRESSOR) 25 MG tablet Take 0.5 tablets (12.5 mg total) by mouth as needed (palpitations). 30 tablet 3  . omeprazole (PRILOSEC) 40 MG capsule Take 40 mg by mouth daily.    . vitamin E 400 UNIT capsule Take 400 Units by mouth every morning.      No current facility-administered medications on file prior to visit.  Social History   Socioeconomic History  . Marital status: Legally Separated    Spouse name: Not on file  . Number of children: Not on file  . Years of education: Not on file  . Highest education level: Not on file  Occupational History  . Not on file  Social Needs  . Financial resource strain: Not on file  . Food insecurity:    Worry: Not on file    Inability: Not on file  . Transportation needs:    Medical: Not on file    Non-medical: Not on file  Tobacco Use  . Smoking status: Never Smoker  . Smokeless tobacco: Never Used  Substance and Sexual Activity  . Alcohol use: No    Alcohol/week: 0.0 standard drinks   . Drug use: No  . Sexual activity: Never    Birth control/protection: Post-menopausal, Surgical    Comment: 1st intercourse 33 yo-2 partners  Lifestyle  . Physical activity:    Days per week: Not on file    Minutes per session: Not on file  . Stress: Not on file  Relationships  . Social connections:    Talks on phone: Not on file    Gets together: Not on file    Attends religious service: Not on file    Active member of club or organization: Not on file    Attends meetings of clubs or organizations: Not on file    Relationship status: Not on file  . Intimate partner violence:    Fear of current or ex partner: Not on file    Emotionally abused: Not on file    Physically abused: Not on file    Forced sexual activity: Not on file  Other Topics Concern  . Not on file  Social History Narrative   Lives in [place].   Lives c 2nd husband    NOK-Gina Axson    Family History  Problem Relation Age of Onset  . Cancer Mother        COLON  . Heart disease Father        STROKE  . Stroke Father   . Ovarian cancer Maternal Grandmother   . Crohn's disease Daughter     BP (!) 106/57   Pulse 62   Ht 5\' 5"  (1.651 m)   Wt 135 lb (61.2 kg)   BMI 22.47 kg/m   Body mass index is 22.47 kg/m.     Objective:   Physical Exam Vitals signs reviewed.  Constitutional:      Appearance: She is well-developed.  HENT:     Head: Normocephalic and atraumatic.  Eyes:     Conjunctiva/sclera: Conjunctivae normal.     Pupils: Pupils are equal, round, and reactive to light.  Neck:     Musculoskeletal: Normal range of motion and neck supple.  Cardiovascular:     Rate and Rhythm: Normal rate and regular rhythm.  Pulmonary:     Effort: Pulmonary effort is normal.  Abdominal:     Palpations: Abdomen is soft.  Musculoskeletal:       Legs:  Skin:    General: Skin is warm and dry.  Neurological:     Mental Status: She is alert and oriented to person, place, and time.     Cranial Nerves: No  cranial nerve deficit.     Motor: No abnormal muscle tone.     Coordination: Coordination normal.     Deep Tendon Reflexes: Reflexes are normal and symmetric. Reflexes normal.  Psychiatric:  Behavior: Behavior normal.        Thought Content: Thought content normal.        Judgment: Judgment normal.    X-rays were done of the right hip, reported separately.       Assessment & Plan:   Encounter Diagnoses  Name Primary?  . Trochanteric bursitis, right hip   . Right hip pain Yes   PROCEDURE NOTE:  The patient request injection, verbal consent was obtained.  The right trochanteric area of the hip was prepped appropriately after time out was performed.   Sterile technique was observed and injection of 1 cc of Depo-Medrol 40 mg with several cc's of plain xylocaine. Anesthesia was provided by ethyl chloride and a 20-gauge needle was used to inject the hip area. The injection was tolerated well.  A band aid dressing was applied.  The patient was advised to apply ice later today and tomorrow to the injection sight as needed.  Pain medicine called in.    I have reviewed the [place] web site prior to prescribing narcotic medicine for this patient.  Return in two weeks.  Use ice to the hip area.  Call if any problem.  Precautions discussed.   Electronically Signed Sanjuana Kava, MD 6/9/20208:35 AM

## 2018-11-17 DIAGNOSIS — Z299 Encounter for prophylactic measures, unspecified: Secondary | ICD-10-CM | POA: Diagnosis not present

## 2018-11-17 DIAGNOSIS — Z7189 Other specified counseling: Secondary | ICD-10-CM | POA: Diagnosis not present

## 2018-11-17 DIAGNOSIS — Z1339 Encounter for screening examination for other mental health and behavioral disorders: Secondary | ICD-10-CM | POA: Diagnosis not present

## 2018-11-17 DIAGNOSIS — F320 Major depressive disorder, single episode, mild: Secondary | ICD-10-CM | POA: Diagnosis not present

## 2018-11-17 DIAGNOSIS — Z1331 Encounter for screening for depression: Secondary | ICD-10-CM | POA: Diagnosis not present

## 2018-11-17 DIAGNOSIS — E7800 Pure hypercholesterolemia, unspecified: Secondary | ICD-10-CM | POA: Diagnosis not present

## 2018-11-17 DIAGNOSIS — Z6822 Body mass index (BMI) 22.0-22.9, adult: Secondary | ICD-10-CM | POA: Diagnosis not present

## 2018-11-17 DIAGNOSIS — Z0000 Encounter for general adult medical examination without abnormal findings: Secondary | ICD-10-CM | POA: Diagnosis not present

## 2018-11-17 DIAGNOSIS — Z79899 Other long term (current) drug therapy: Secondary | ICD-10-CM | POA: Diagnosis not present

## 2018-11-17 DIAGNOSIS — E039 Hypothyroidism, unspecified: Secondary | ICD-10-CM | POA: Diagnosis not present

## 2018-11-17 DIAGNOSIS — R5383 Other fatigue: Secondary | ICD-10-CM | POA: Diagnosis not present

## 2018-11-17 DIAGNOSIS — Z1211 Encounter for screening for malignant neoplasm of colon: Secondary | ICD-10-CM | POA: Diagnosis not present

## 2018-11-18 ENCOUNTER — Encounter: Payer: Self-pay | Admitting: Orthopaedic Surgery

## 2018-11-18 ENCOUNTER — Other Ambulatory Visit: Payer: Self-pay

## 2018-11-18 ENCOUNTER — Ambulatory Visit (INDEPENDENT_AMBULATORY_CARE_PROVIDER_SITE_OTHER): Payer: Medicare Other | Admitting: Orthopaedic Surgery

## 2018-11-18 VITALS — BP 127/62 | HR 73 | Ht 65.0 in | Wt 135.4 lb

## 2018-11-18 DIAGNOSIS — M25551 Pain in right hip: Secondary | ICD-10-CM

## 2018-11-18 DIAGNOSIS — M7061 Trochanteric bursitis, right hip: Secondary | ICD-10-CM | POA: Diagnosis not present

## 2018-11-18 MED ORDER — PREDNISONE 5 MG (21) PO TBPK: ORAL_TABLET | ORAL | 0 refills | Status: DC

## 2018-11-18 NOTE — Progress Notes (Signed)
PROCEDURE NOTE:  The patient request injection, verbal consent was obtained.  The right trochanteric area of the hip was prepped appropriately after time out was performed.   Sterile technique was observed and injection of 1 cc of Depo-Medrol 40 mg with several cc's of plain xylocaine. Anesthesia was provided by ethyl chloride and a 20-gauge needle was used to inject the hip area. The injection was tolerated well.  A band aid dressing was applied.  The patient was advised to apply ice later today and tomorrow to the injection sight as needed.  Return in three weeks.  Electronically Signed Sanjuana Kava, MD 6/24/202010:32 AM

## 2018-11-19 ENCOUNTER — Ambulatory Visit: Payer: Medicare Other | Admitting: Orthopaedic Surgery

## 2018-11-25 ENCOUNTER — Telehealth: Payer: Self-pay | Admitting: *Deleted

## 2018-11-25 NOTE — Telephone Encounter (Signed)
-----   Message from Sinclair Grooms sent at 11/25/2018 10:41 AM EDT ----- Regarding: prolia Patient is getting an injection for her hip on July 22 she does not know name of injection but wants to know if it will interfere with her prolia injection on July 29?

## 2018-11-25 NOTE — Telephone Encounter (Signed)
Okay for Prolia.

## 2018-11-25 NOTE — Telephone Encounter (Signed)
Will route pt message to Dr. Denman George  Question: Is it ok to proceed with Prolia pt will get Depo-Medrol 40 mg with several cc's of plain xylocaine. Injected into hip Juy 22nd. Pt wondering if there's any interactions between the two mediations.Prolia appt  12/23/2018

## 2018-12-01 DIAGNOSIS — Z961 Presence of intraocular lens: Secondary | ICD-10-CM | POA: Diagnosis not present

## 2018-12-01 DIAGNOSIS — H43812 Vitreous degeneration, left eye: Secondary | ICD-10-CM | POA: Diagnosis not present

## 2018-12-01 DIAGNOSIS — H40013 Open angle with borderline findings, low risk, bilateral: Secondary | ICD-10-CM | POA: Diagnosis not present

## 2018-12-04 NOTE — Telephone Encounter (Signed)
Deductible $198    Annual exam UPCOMING 12/23/2018 TF  Calcium  9.6           Date 02/17/18  Upcoming dental procedures NO  Prior Authorization needed NO  Pt estimated Cost $198       Coverage Details: $0 AFTER DED MET

## 2018-12-16 ENCOUNTER — Encounter: Payer: Self-pay | Admitting: Orthopaedic Surgery

## 2018-12-16 ENCOUNTER — Other Ambulatory Visit: Payer: Self-pay

## 2018-12-16 ENCOUNTER — Ambulatory Visit (INDEPENDENT_AMBULATORY_CARE_PROVIDER_SITE_OTHER): Payer: Medicare Other | Admitting: Orthopaedic Surgery

## 2018-12-16 VITALS — Ht 65.0 in | Wt 135.0 lb

## 2018-12-16 DIAGNOSIS — M7061 Trochanteric bursitis, right hip: Secondary | ICD-10-CM | POA: Diagnosis not present

## 2018-12-16 DIAGNOSIS — M5441 Lumbago with sciatica, right side: Secondary | ICD-10-CM | POA: Diagnosis not present

## 2018-12-16 MED ORDER — HYDROCODONE-ACETAMINOPHEN 5-325 MG PO TABS: ORAL_TABLET | ORAL | 0 refills | Status: DC

## 2018-12-16 MED ORDER — PREDNISONE 5 MG (21) PO TBPK: ORAL_TABLET | ORAL | 0 refills | Status: DC

## 2018-12-16 NOTE — Addendum Note (Signed)
Addended by: Willette Pa on: 12/16/2018 11:00 AM   Modules accepted: Orders

## 2018-12-16 NOTE — Progress Notes (Signed)
Patient ER:DEYCXKG CYLINDA SANTOLI, female DOB:07-17-1925, 85 y.o. YJE:[id number]  Chief Complaint  Patient presents with  . Hip Pain    R/pain rad to r leg for months tingling to foot    HPI  SIOBHAN ZARO is a 85 y.o. female who has recurrent right hip trochanteric bursitis but now she has been having pain running from her back to the foot on the right.  She has pain when first standing up and making the first few steps.  This pain is getting worse. She has taken Advil and has pain medicine.  She has no falls.  She is post right hip bipolar about six years ago and right knee surgery about five years ago in [place].  She has no recent falls. She has no redness.   Body mass index is 22.47 kg/m.  ROS  Review of Systems  Constitutional: Positive for activity change.  Musculoskeletal: Positive for back pain, gait problem and joint swelling.  All other systems reviewed and are negative.   All other systems reviewed and are negative.  The following is a summary of the past history medically, past history surgically, known current medicines, social history and family history.  This information is gathered electronically by the computer from prior information and documentation.  I review this each visit and have found including this information at this point in the chart is beneficial and informative.    Past Medical History:  Diagnosis Date  . GERD (gastroesophageal reflux disease)   . History of kidney stones   . Hypothyroidism   . LBBB (left bundle branch block)   . MVP (mitral valve prolapse)   . Osteoporosis   . Palpitations   . RA (rheumatoid arthritis) ([place])   . Right ureteral stone   . Rosacea     Past Surgical History:  Procedure Laterality Date  . CATARACT EXTRACTION W/ INTRAOCULAR LENS  IMPLANT, BILATERAL  4 yrs ago   both eyes  . CHOLECYSTECTOMY  2005  . EXTRACORPOREAL SHOCK WAVE LITHOTRIPSY  09-24-10   and 1990  . Femur fx    . HIP ARTHROPLASTY Right  10/12/2012   Procedure: ARTHROPLASTY BIPOLAR HIP;  Surgeon: Gearlean Alf, MD;  [facility];  Service: Orthopedics;  Laterality: Right;  . KNEE ARTHROSCOPY  04/17/2011, right   Procedure: ARTHROSCOPY KNEE;  Surgeon: Gearlean Alf;  Location: [place];  Service: Orthopedics;  Laterality: Right;  WITH LATERAL MENISCAL DEBRIDEMENT  . MOLES EXCISED  2012   on nose  . Sternum Fx    . TOTAL KNEE ARTHROPLASTY  03/23/2012   Procedure: TOTAL KNEE ARTHROPLASTY;  Surgeon: Gearlean Alf, MD;  [facility];  Service: Orthopedics;  Laterality: Right;  Marland Kitchen VAGINAL HYSTERECTOMY  1970   Leiomyomata    Family History  Problem Relation Age of Onset  . Cancer Mother        COLON  . Heart disease Father        STROKE  . Stroke Father   . Ovarian cancer Maternal Grandmother   . Crohn's disease Daughter     Social History Social History   Tobacco Use  . Smoking status: Never Smoker  . Smokeless tobacco: Never Used  Substance Use Topics  . Alcohol use: No    Alcohol/week: 0.0 standard drinks  . Drug use: No    Allergies  Allergen Reactions  . Contrast Media [Iodinated Diagnostic Agents]     Arm swelled up and was painful after IVP years  ago/   . Levaquin [Levofloxacin In D5w]     Made her sick all over  . Penicillins Hives    03/04/13: Pt has tolerated Amoxicillin/Unsyn without reaction  . Ciprofloxacin Rash    Current Outpatient Medications  Medication Sig Dispense Refill  . ASPIRIN 81 PO Take 1 tablet by mouth daily.     . cholecalciferol (VITAMIN D) 1000 units tablet Take 1,000 Units by mouth every morning.    . denosumab (PROLIA) 60 MG/ML SOLN injection Inject 60 mg into the skin every 6 (six) months. Administer in upper arm, thigh, or abdomen    . HYDROcodone-acetaminophen (NORCO/VICODIN) 5-325 MG tablet One tablet every four hours as needed for acute pain.  Limit of five days per [place]. 30 tablet 0  . levothyroxine (SYNTHROID, LEVOTHROID) 75  MCG tablet Take 75 mcg by mouth daily before breakfast.    . metoprolol tartrate (LOPRESSOR) 25 MG tablet Take 0.5 tablets (12.5 mg total) by mouth as needed (palpitations). 30 tablet 3  . omeprazole (PRILOSEC) 40 MG capsule Take 40 mg by mouth daily.    . predniSONE (STERAPRED UNI-PAK 21 TAB) 5 MG (21) TBPK tablet Take 6 pills first day; 5 pills second day; 4 pills third day; 3 pills fourth day; 2 pills next day and 1 pill last day. 21 tablet 0  . vitamin E 400 UNIT capsule Take 400 Units by mouth every morning.      No current facility-administered medications for this visit.      Physical Exam  Height 5\' 5"  (1.651 m), weight 135 lb (61.2 kg).  Constitutional: overall normal hygiene, normal nutrition, well developed, normal grooming, normal body habitus. Assistive device:none  Musculoskeletal: gait and station Limp right, muscle tone and strength are normal, no tremors or atrophy is present.  .  Neurological: coordination overall normal.  Deep tendon reflex/nerve stretch intact.  Sensation normal.  Cranial nerves II-XII intact.   Skin:   Normal overall no scars, lesions, ulcers or rashes. No psoriasis.  Psychiatric: Alert and oriented x 3.  Recent memory intact, remote memory unclear.  Normal mood and affect. Well groomed.  Good eye contact.  Cardiovascular: overall no swelling, no varicosities, no edema bilaterally, normal temperatures of the legs and arms, no clubbing, cyanosis and good capillary refill.  Lymphatic: palpation is normal.  She is very tender over the right hip trochanteric bursa area with no redness or swelling. She has well healed scar from prior hip surgery.  Spine/Pelvis examination:  Inspection:  Overall, sacoiliac joint benign and hips nontender; without crepitus or defects.   Thoracic spine inspection: Alignment normal without kyphosis present   Lumbar spine inspection:  Alignment  with normal lumbar lordosis, without scoliosis apparent.   Thoracic spine  palpation:  without tenderness of spinal processes   Lumbar spine palpation: without tenderness of lumbar area; without tightness of lumbar muscles    Range of Motion:   Lumbar flexion, forward flexion is normal without pain or tenderness    Lumbar extension is full without pain or tenderness   Left lateral bend is normal without pain or tenderness   Right lateral bend is normal without pain or tenderness   Straight leg raising is normal  Strength & tone: normal   Stability overall normal stability  All other systems reviewed and are negative   The patient has been educated about the nature of the problem(s) and counseled on treatment options.  The patient appeared to understand what I have discussed and  is in agreement with it.  Encounter Diagnoses  Name Primary?  . Trochanteric bursitis, right hip Yes  . Acute midline low back pain with right-sided sciatica   PROCEDURE NOTE:  The patient request injection, verbal consent was obtained.  The right trochanteric area of the hip was prepped appropriately after time out was performed.   Sterile technique was observed and injection of 1 cc of Depo-Medrol 40 mg with several cc's of plain xylocaine. Anesthesia was provided by ethyl chloride and a 20-gauge needle was used to inject the hip area. The injection was tolerated well.  A band aid dressing was applied.  The patient was advised to apply ice later today and tomorrow to the injection sight as needed.  I will get MRI of the lumbar spine.  She had X-rays of the right hip and lower back, reported separately.  She has bipolar hip well seated with no signs of loosening or fracture.  She has DJD changes of the lower back with old mild compression fracture of L2.  For 90 the x-rays look very good.   PLAN Call if any problems.  Precautions discussed.  Continue current medications.   Return to clinic after MRI of the lumbar spine.   I will call in prednisone dose pack.  Call  if any problem.  Precautions discussed.   Electronically Signed Sanjuana Kava, MD 7/22/202010:50 AM

## 2018-12-16 NOTE — Addendum Note (Signed)
Addended by: Derek Mound A on: 12/16/2018 04:29 PM   Modules accepted: Orders

## 2018-12-22 ENCOUNTER — Other Ambulatory Visit: Payer: Self-pay

## 2018-12-22 ENCOUNTER — Ambulatory Visit (HOSPITAL_COMMUNITY)
Admission: RE | Admit: 2018-12-22 | Discharge: 2018-12-22 | Disposition: A | Discharge status: Home / Self Care | Payer: Medicare Other | Source: Ambulatory Visit | Attending: Orthopaedic Surgery | Admitting: Orthopaedic Surgery

## 2018-12-22 DIAGNOSIS — M7061 Trochanteric bursitis, right hip: Secondary | ICD-10-CM

## 2018-12-22 DIAGNOSIS — Z96641 Presence of right artificial hip joint: Secondary | ICD-10-CM | POA: Diagnosis not present

## 2018-12-22 DIAGNOSIS — Z471 Aftercare following joint replacement surgery: Secondary | ICD-10-CM | POA: Diagnosis not present

## 2018-12-23 ENCOUNTER — Encounter: Payer: Self-pay | Admitting: Gynecology

## 2018-12-23 ENCOUNTER — Ambulatory Visit (INDEPENDENT_AMBULATORY_CARE_PROVIDER_SITE_OTHER): Payer: Medicare Other | Admitting: Gynecology

## 2018-12-23 VITALS — BP 110/68 | Ht 65.0 in | Wt 135.0 lb

## 2018-12-23 DIAGNOSIS — M810 Age-related osteoporosis without current pathological fracture: Secondary | ICD-10-CM

## 2018-12-23 DIAGNOSIS — Z01419 Encounter for gynecological examination (general) (routine) without abnormal findings: Secondary | ICD-10-CM

## 2018-12-23 DIAGNOSIS — N39498 Other specified urinary incontinence: Secondary | ICD-10-CM

## 2018-12-23 DIAGNOSIS — N952 Postmenopausal atrophic vaginitis: Secondary | ICD-10-CM

## 2018-12-23 NOTE — Patient Instructions (Signed)
Follow-up when due for your next Prolia shot

## 2018-12-23 NOTE — Progress Notes (Signed)
    Debra Richards 01-15-1926 [id number]        85 y.o.  [id number] for breast and pelvic exam.  Without gynecologic complaints  Past medical history,surgical history, problem list, medications, allergies, family history and social history were all reviewed and documented as reviewed in the EPIC chart.  ROS:  Performed with pertinent positives and negatives included in the history, assessment and plan.   Additional significant findings : None   Exam: [name] assistant Vitals:   12/23/18 1108  BP: 110/68  Weight: 135 lb (61.2 kg)  Height: 5\' 5"  (1.651 m)   Body mass index is 22.47 kg/m.  General appearance:  Normal affect, orientation and appearance. Skin: Grossly normal HEENT: Without gross lesions.  No cervical or supraclavicular adenopathy. Thyroid normal.  Lungs:  Clear without wheezing, rales or rhonchi Cardiac: RR, without RMG Abdominal:  Soft, nontender, without masses, guarding, rebound, organomegaly or hernia Breasts:  Examined lying and sitting without masses, retractions, discharge or axillary adenopathy. Pelvic:  Ext, BUS, Vagina: With atrophic changes  Adnexa: Without masses or tenderness    Anus and perineum: Normal   Rectovaginal: Normal sphincter tone without palpated masses or tenderness.    Assessment/Plan:  85 y.o. [id number] female for breast and pelvic exam.  Status post vaginal hysterectomy for leiomyoma  1. Postmenopausal.  No significant menopausal symptoms. 2. Osteoporosis.  DEXA 04/2017 T score -2.4 previous T score -2.7.  On Prolia for approximately 5 years.  Doing well with this.  We will continue on Prolia every 6 months.  Plan repeat DEXA next year at 2+ year interval. 3. Mammography reported last year.  Do not have a copy of this.  Patient will continue with annual mammography. 4. Colonoscopy 2011.  Will follow primary physician's recommendation for colon screening given age. 5. Pap smear 2011.  No Pap smear done today.  No history of  significant abnormal Pap smears.  We both agree to stop screening per current screening guidelines. 6. Health maintenance.  No routine lab work done as patient does this elsewhere.  Follow-up 1 year, sooner as needed.   [name] MD, 11:34 AM 12/23/2018

## 2018-12-24 ENCOUNTER — Ambulatory Visit (HOSPITAL_COMMUNITY): Payer: Medicare Other

## 2018-12-25 LAB — URINALYSIS, COMPLETE W/RFL CULTURE
Bacteria, UA: NONE SEEN /HPF
Bilirubin Urine: NEGATIVE
Glucose, UA: NEGATIVE
Hgb urine dipstick: NEGATIVE
Hyaline Cast: NONE SEEN /LPF
Ketones, ur: NEGATIVE
Nitrites, Initial: NEGATIVE
Protein, ur: NEGATIVE
Specific Gravity, Urine: 1.012 (ref 1.001–1.03)
Squamous Epithelial / HPF: NONE SEEN /HPF (ref <=5)
pH: 6.5 (ref 5.0–8.0)

## 2018-12-25 LAB — CULTURE INDICATED

## 2018-12-25 LAB — URINE CULTURE
MICRO NUMBER:: 720103
SPECIMEN QUALITY:: ADEQUATE

## 2019-01-08 ENCOUNTER — Other Ambulatory Visit: Payer: Self-pay

## 2019-01-08 ENCOUNTER — Ambulatory Visit: Payer: Medicare Other | Admitting: Urology

## 2019-01-13 ENCOUNTER — Other Ambulatory Visit: Payer: Self-pay

## 2019-01-13 ENCOUNTER — Ambulatory Visit (INDEPENDENT_AMBULATORY_CARE_PROVIDER_SITE_OTHER): Payer: Medicare Other | Admitting: Orthopaedic Surgery

## 2019-01-13 ENCOUNTER — Encounter: Payer: Self-pay | Admitting: Orthopaedic Surgery

## 2019-01-13 VITALS — Ht 65.0 in | Wt 135.0 lb

## 2019-01-13 DIAGNOSIS — M5441 Lumbago with sciatica, right side: Secondary | ICD-10-CM | POA: Diagnosis not present

## 2019-01-13 NOTE — Progress Notes (Signed)
Patient [id number] Debra Richards, female DOB:1925/10/11, 85 y.o. [id number]  Chief Complaint  Patient presents with  . Hip Pain    R/here to go over MRI    HPI  Debra Richards is a 85 y.o. female who has lower back pain. She has right hip pain as well. 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 I ordered a MRI of the lumbar spine.  A MRI of the right hip was done which was negative.   I have reordered a MRI of the lumbar spine.  I have told the patient. She is a little better. She has less pain running down the right hip and leg to the right foot. But she still has pain.  Her gait is better.   There is no height or weight on file to calculate BMI.  ROS  Review of Systems  Constitutional: Positive for activity change.  Musculoskeletal: Positive for back pain, gait problem and joint swelling.  All other systems reviewed and are negative.   All other systems reviewed and are negative.  The following is a summary of the past history medically, past history surgically, known current medicines, social history and family history.  This information is gathered electronically by the computer from prior information and documentation.  I review this each visit and have found including this information at this point in the chart is beneficial and informative.    Past Medical History:  Diagnosis Date  . GERD (gastroesophageal reflux disease)   . History of kidney stones   . Hypothyroidism   . LBBB (left bundle branch block)   . MVP (mitral valve prolapse)   . Osteoporosis   . Palpitations   . RA (rheumatoid arthritis) ([place])   . Right ureteral stone   . Rosacea     Past Surgical History:  Procedure Laterality Date  . CATARACT EXTRACTION W/ INTRAOCULAR LENS  IMPLANT, BILATERAL  4 yrs ago   both eyes  . CHOLECYSTECTOMY  2005  . EXTRACORPOREAL SHOCK WAVE LITHOTRIPSY  09-24-10   and 1990  . Femur fx    . HIP ARTHROPLASTY Right 10/12/2012   Procedure: ARTHROPLASTY BIPOLAR HIP;  Surgeon:  Gearlean Alf, MD;  Location: [facility];  Service: Orthopedics;  Laterality: Right;  . KNEE ARTHROSCOPY  04/17/2011, right   Procedure: ARTHROSCOPY KNEE;  Surgeon: Gearlean Alf;  Location: [place];  Service: Orthopedics;  Laterality: Right;  WITH LATERAL MENISCAL DEBRIDEMENT  . MOLES EXCISED  2012   on nose  . Sternum Fx    . TOTAL KNEE ARTHROPLASTY  03/23/2012   Procedure: TOTAL KNEE ARTHROPLASTY;  Surgeon: Gearlean Alf, MD;  Location: [facility];  Service: Orthopedics;  Laterality: Right;  Marland Kitchen VAGINAL HYSTERECTOMY  1970   Leiomyomata    Family History  Problem Relation Age of Onset  . Cancer Mother        COLON  . Heart disease Father        STROKE  . Stroke Father   . Ovarian cancer Maternal Grandmother   . Crohn's disease Daughter     Social History Social History   Tobacco Use  . Smoking status: Never Smoker  . Smokeless tobacco: Never Used  Substance Use Topics  . Alcohol use: No    Alcohol/week: 0.0 standard drinks  . Drug use: No    Allergies  Allergen Reactions  . Contrast Media [Iodinated Diagnostic Agents]     Arm swelled up and was painful after IVP years ago/   .  Levaquin [Levofloxacin In D5w]     Made her sick all over  . Penicillins Hives    03/04/13: Pt has tolerated Amoxicillin/Unsyn without reaction  . Ciprofloxacin Rash    Current Outpatient Medications  Medication Sig Dispense Refill  . ASPIRIN 81 PO Take 1 tablet by mouth daily.     . cholecalciferol (VITAMIN D) 1000 units tablet Take 1,000 Units by mouth every morning.    . denosumab (PROLIA) 60 MG/ML SOLN injection Inject 60 mg into the skin every 6 (six) months. Administer in upper arm, thigh, or abdomen    . HYDROcodone-acetaminophen (NORCO/VICODIN) 5-325 MG tablet One tablet every four hours as needed for acute pain.  Limit of five days per [place] statue. 30 tablet 0  . levothyroxine (SYNTHROID, LEVOTHROID) 75 MCG tablet Take 75 mcg by mouth daily before breakfast.    .  metoprolol tartrate (LOPRESSOR) 25 MG tablet Take 0.5 tablets (12.5 mg total) by mouth as needed (palpitations). 30 tablet 3  . omeprazole (PRILOSEC) 40 MG capsule Take 40 mg by mouth daily.    . vitamin E 400 UNIT capsule Take 400 Units by mouth every morning.      No current facility-administered medications for this visit.      Physical Exam  There were no vitals taken for this visit.  Constitutional: overall normal hygiene, normal nutrition, well developed, normal grooming, normal body habitus. Assistive device:none  Musculoskeletal: gait and station Limp none, muscle tone and strength are normal, no tremors or atrophy is present.  .  Neurological: coordination overall normal.  Deep tendon reflex/nerve stretch intact.  Sensation normal.  Cranial nerves II-XII intact.   Skin:   Normal overall no scars, lesions, ulcers or rashes. No psoriasis.  Psychiatric: Alert and oriented x 3.  Recent memory intact, remote memory unclear.  Normal mood and affect. Well groomed.  Good eye contact.  Cardiovascular: overall no swelling, no varicosities, no edema bilaterally, normal temperatures of the legs and arms, no clubbing, cyanosis and good capillary refill.  Spine/Pelvis examination:  Inspection:  Overall, sacoiliac joint benign and hips nontender; without crepitus or defects.   Thoracic spine inspection: Alignment normal without kyphosis present   Lumbar spine inspection:  Alignment  with normal lumbar lordosis, without scoliosis apparent.   Thoracic spine palpation:  without tenderness of spinal processes   Lumbar spine palpation: without tenderness of lumbar area; without tightness of lumbar muscles    Range of Motion:   Lumbar flexion, forward flexion is normal without pain or tenderness    Lumbar extension is full without pain or tenderness   Left lateral bend is normal without pain or tenderness   Right lateral bend is normal without pain or tenderness   Straight leg raising is  normal  Strength & tone: normal   Stability overall normal stability Lymphatic: palpation is normal.  All other systems reviewed and are negative   The patient has been educated about the nature of the problem(s) and counseled on treatment options.  The patient appeared to understand what I have discussed and is in agreement with it.  Encounter Diagnosis  Name Primary?  . Acute midline low back pain with right-sided sciatica Yes    PLAN Call if any problems.  Precautions discussed.  Continue current medications.   Return to clinic 2 weeks   Get MRI of the lumbar spine.  Electronically Signed Sanjuana Kava, MD 8/19/202011:51 AM

## 2019-01-13 NOTE — Addendum Note (Signed)
Addended by: Derek Mound A on: 01/13/2019 01:28 PM   Modules accepted: Orders

## 2019-01-20 ENCOUNTER — Ambulatory Visit (HOSPITAL_COMMUNITY)
Admission: RE | Admit: 2019-01-20 | Discharge: 2019-01-20 | Disposition: A | Discharge status: Home / Self Care | Payer: Medicare Other | Source: Ambulatory Visit | Attending: Orthopaedic Surgery | Admitting: Orthopaedic Surgery

## 2019-01-20 ENCOUNTER — Other Ambulatory Visit: Payer: Self-pay

## 2019-01-20 DIAGNOSIS — M5441 Lumbago with sciatica, right side: Secondary | ICD-10-CM | POA: Insufficient documentation

## 2019-01-20 DIAGNOSIS — M545 Low back pain: Secondary | ICD-10-CM | POA: Diagnosis not present

## 2019-01-27 ENCOUNTER — Encounter: Payer: Self-pay | Admitting: Orthopaedic Surgery

## 2019-01-27 ENCOUNTER — Other Ambulatory Visit: Payer: Self-pay

## 2019-01-27 ENCOUNTER — Ambulatory Visit (INDEPENDENT_AMBULATORY_CARE_PROVIDER_SITE_OTHER): Payer: Medicare Other | Admitting: Orthopaedic Surgery

## 2019-01-27 VITALS — Ht 65.0 in | Wt 135.0 lb

## 2019-01-27 DIAGNOSIS — M5441 Lumbago with sciatica, right side: Secondary | ICD-10-CM | POA: Diagnosis not present

## 2019-01-27 DIAGNOSIS — G8929 Other chronic pain: Secondary | ICD-10-CM

## 2019-01-27 NOTE — Progress Notes (Signed)
Patient [id number] Debra Richards, female DOB:Oct 30, 1925, 85 y.o. [id number]  Chief Complaint  Patient presents with  . Back Pain    Better at times/pain still going down my leg    HPI  Debra Richards is a 85 y.o. female who has chronic pain and right sided sciatica that is getting worse.  She has pain running down to the right lower leg and foot.    She had MRI which showed: IMPRESSION: Marked progression of degenerative disease at L4-5 where there is a large down turning central and right side disc protrusion impinging on the descending right L5 root. There is moderate to moderately severe central canal stenosis overall at this level.  Some progression of disease at L5-S1 where a down turning central and eccentric to the right protrusion encroaching on the descending right S1 root has slightly increased in size.  Remote L1 superior endplate compression fracture is unchanged.  I have explained the findings to her.    I will have neurosurgeon evaluate her for possible epidural vs surgery.  She has not improved and has pain most of the time.  She had been very active until this pain occurred. She is very spry and very independent and has few other problems.   Body mass index is 22.47 kg/m.  ROS  Review of Systems  Constitutional: Positive for activity change.  Musculoskeletal: Positive for back pain, gait problem and joint swelling.  All other systems reviewed and are negative.   All other systems reviewed and are negative.  The following is a summary of the past history medically, past history surgically, known current medicines, social history and family history.  This information is gathered electronically by the computer from prior information and documentation.  I review this each visit and have found including this information at this point in the chart is beneficial and informative.    Past Medical History:  Diagnosis Date  . GERD (gastroesophageal reflux  disease)   . History of kidney stones   . Hypothyroidism   . LBBB (left bundle branch block)   . MVP (mitral valve prolapse)   . Osteoporosis   . Palpitations   . RA (rheumatoid arthritis) ([place])   . Right ureteral stone   . Rosacea     Past Surgical History:  Procedure Laterality Date  . CATARACT EXTRACTION W/ INTRAOCULAR LENS  IMPLANT, BILATERAL  4 yrs ago   both eyes  . CHOLECYSTECTOMY  2005  . EXTRACORPOREAL SHOCK WAVE LITHOTRIPSY  09-24-10   and 1990  . Femur fx    . HIP ARTHROPLASTY Right 10/12/2012   Procedure: ARTHROPLASTY BIPOLAR HIP;  Surgeon: Gearlean Alf, MD;  Location: [facility];  Service: Orthopedics;  Laterality: Right;  . KNEE ARTHROSCOPY  04/17/2011, right   Procedure: ARTHROSCOPY KNEE;  Surgeon: Gearlean Alf;  Location: [place];  Service: Orthopedics;  Laterality: Right;  WITH LATERAL MENISCAL DEBRIDEMENT  . MOLES EXCISED  2012   on nose  . Sternum Fx    . TOTAL KNEE ARTHROPLASTY  03/23/2012   Procedure: TOTAL KNEE ARTHROPLASTY;  Surgeon: Gearlean Alf, MD;  [facility];  Service: Orthopedics;  Laterality: Right;  Marland Kitchen VAGINAL HYSTERECTOMY  1970   Leiomyomata    Family History  Problem Relation Age of Onset  . Cancer Mother        COLON  . Heart disease Father        STROKE  . Stroke Father   . Ovarian cancer Maternal  Grandmother   . Crohn's disease Daughter     Social History Social History   Tobacco Use  . Smoking status: Never Smoker  . Smokeless tobacco: Never Used  Substance Use Topics  . Alcohol use: No    Alcohol/week: 0.0 standard drinks  . Drug use: No    Allergies  Allergen Reactions  . Contrast Media [Iodinated Diagnostic Agents]     Arm swelled up and was painful after IVP years ago/   . Levaquin [Levofloxacin In D5w]     Made her sick all over  . Penicillins Hives    03/04/13: Pt has tolerated Amoxicillin/Unsyn without reaction  . Ciprofloxacin Rash    Current Outpatient Medications  Medication  Sig Dispense Refill  . ASPIRIN 81 PO Take 1 tablet by mouth daily.     . cholecalciferol (VITAMIN D) 1000 units tablet Take 1,000 Units by mouth every morning.    . denosumab (PROLIA) 60 MG/ML SOLN injection Inject 60 mg into the skin every 6 (six) months. Administer in upper arm, thigh, or abdomen    . HYDROcodone-acetaminophen (NORCO/VICODIN) 5-325 MG tablet One tablet every four hours as needed for acute pain.  Limit of five days per [place] statue. 30 tablet 0  . levothyroxine (SYNTHROID, LEVOTHROID) 75 MCG tablet Take 75 mcg by mouth daily before breakfast.    . metoprolol tartrate (LOPRESSOR) 25 MG tablet Take 0.5 tablets (12.5 mg total) by mouth as needed (palpitations). 30 tablet 3  . omeprazole (PRILOSEC) 40 MG capsule Take 40 mg by mouth daily.    . vitamin E 400 UNIT capsule Take 400 Units by mouth every morning.      No current facility-administered medications for this visit.      Physical Exam  Height 5\' 5"  (1.651 m), weight 135 lb (61.2 kg).  Constitutional: overall normal hygiene, normal nutrition, well developed, normal grooming, normal body habitus. Assistive device:none  Musculoskeletal: gait and station Limp none, muscle tone and strength are normal, no tremors or atrophy is present.  .  Neurological: coordination overall normal.  Deep tendon reflex/nerve stretch intact.  Sensation normal.  Cranial nerves II-XII intact.   Skin:   Normal overall no scars, lesions, ulcers or rashes. No psoriasis.  Psychiatric: Alert and oriented x 3.  Recent memory intact, remote memory unclear.  Normal mood and affect. Well groomed.  Good eye contact.  Cardiovascular: overall no swelling, no varicosities, no edema bilaterally, normal temperatures of the legs and arms, no clubbing, cyanosis and good capillary refill.  Lymphatic: palpation is normal.  Spine/Pelvis examination:  Inspection:  Overall, sacoiliac joint benign and hips nontender; without crepitus or  defects.   Thoracic spine inspection: Alignment normal without kyphosis present   Lumbar spine inspection:  Alignment  without normal lumbar lordosis, without scoliosis apparent.   Thoracic spine palpation:  without tenderness of spinal processes   Lumbar spine palpation: with tenderness of lumbar area; with tightness of lumbar muscles    Range of Motion:   Lumbar flexion, forward flexion is 40 with pain or tenderness    Lumbar extension is 5 with pain or tenderness   Left lateral bend is Normal  with pain or tenderness   Right lateral bend is Normal with pain or tenderness   Straight leg raising is Abnormal- 25 degrees   Strength & tone: Normal   Stability overall normal stability   All other systems reviewed and are negative   The patient has been educated about the nature of the  problem(s) and counseled on treatment options.  The patient appeared to understand what I have discussed and is in agreement with it.  Encounter Diagnosis  Name Primary?  . Chronic right-sided low back pain with right-sided sciatica Yes    PLAN Call if any problems.  Precautions discussed.  Continue current medications.   Return to clinic to neurosurgeon  Evaluate whether epidural or surgery.   Electronically Signed Sanjuana Kava, MD 9/2/202010:34 AM

## 2019-01-28 NOTE — Addendum Note (Signed)
Addended by: Derek Mound A on: 01/28/2019 04:38 PM   Modules accepted: Orders

## 2019-02-09 DIAGNOSIS — M47816 Spondylosis without myelopathy or radiculopathy, lumbar region: Secondary | ICD-10-CM | POA: Diagnosis not present

## 2019-02-09 DIAGNOSIS — M549 Dorsalgia, unspecified: Secondary | ICD-10-CM | POA: Diagnosis not present

## 2019-02-09 DIAGNOSIS — M48062 Spinal stenosis, lumbar region with neurogenic claudication: Secondary | ICD-10-CM | POA: Diagnosis not present

## 2019-02-09 DIAGNOSIS — M5136 Other intervertebral disc degeneration, lumbar region: Secondary | ICD-10-CM | POA: Diagnosis not present

## 2019-02-09 DIAGNOSIS — M5416 Radiculopathy, lumbar region: Secondary | ICD-10-CM | POA: Diagnosis not present

## 2019-02-09 DIAGNOSIS — M5126 Other intervertebral disc displacement, lumbar region: Secondary | ICD-10-CM | POA: Diagnosis not present

## 2019-02-18 DIAGNOSIS — M48061 Spinal stenosis, lumbar region without neurogenic claudication: Secondary | ICD-10-CM | POA: Diagnosis not present

## 2019-02-18 DIAGNOSIS — M4726 Other spondylosis with radiculopathy, lumbar region: Secondary | ICD-10-CM | POA: Diagnosis not present

## 2019-02-18 DIAGNOSIS — M5136 Other intervertebral disc degeneration, lumbar region: Secondary | ICD-10-CM | POA: Diagnosis not present

## 2019-03-04 ENCOUNTER — Encounter: Payer: Self-pay | Admitting: Gynecology

## 2019-03-08 DIAGNOSIS — E039 Hypothyroidism, unspecified: Secondary | ICD-10-CM | POA: Diagnosis not present

## 2019-03-08 DIAGNOSIS — K219 Gastro-esophageal reflux disease without esophagitis: Secondary | ICD-10-CM | POA: Diagnosis not present

## 2019-03-08 DIAGNOSIS — I447 Left bundle-branch block, unspecified: Secondary | ICD-10-CM | POA: Diagnosis not present

## 2019-03-08 DIAGNOSIS — Z299 Encounter for prophylactic measures, unspecified: Secondary | ICD-10-CM | POA: Diagnosis not present

## 2019-03-08 DIAGNOSIS — Z6822 Body mass index (BMI) 22.0-22.9, adult: Secondary | ICD-10-CM | POA: Diagnosis not present

## 2019-03-12 NOTE — Telephone Encounter (Signed)
[place] GIVEN 12/23/2018 NEXT INJECTION 07/26/2019

## 2019-03-23 ENCOUNTER — Other Ambulatory Visit: Payer: Self-pay

## 2019-03-23 ENCOUNTER — Encounter: Payer: Self-pay | Admitting: *Deleted

## 2019-03-23 ENCOUNTER — Encounter: Payer: Self-pay | Admitting: Cardiology

## 2019-03-23 ENCOUNTER — Ambulatory Visit (INDEPENDENT_AMBULATORY_CARE_PROVIDER_SITE_OTHER): Payer: Medicare Other | Admitting: Cardiology

## 2019-03-23 VITALS — BP 117/70 | HR 62 | Ht 65.0 in | Wt 138.0 lb

## 2019-03-23 DIAGNOSIS — I3139 Other pericardial effusion (noninflammatory): Secondary | ICD-10-CM

## 2019-03-23 DIAGNOSIS — R072 Precordial pain: Secondary | ICD-10-CM

## 2019-03-23 DIAGNOSIS — I313 Pericardial effusion (noninflammatory): Secondary | ICD-10-CM | POA: Diagnosis not present

## 2019-03-23 DIAGNOSIS — R002 Palpitations: Secondary | ICD-10-CM | POA: Diagnosis not present

## 2019-03-23 DIAGNOSIS — I447 Left bundle-branch block, unspecified: Secondary | ICD-10-CM

## 2019-03-23 DIAGNOSIS — R0600 Dyspnea, unspecified: Secondary | ICD-10-CM

## 2019-03-23 DIAGNOSIS — R0609 Other forms of dyspnea: Secondary | ICD-10-CM

## 2019-03-23 NOTE — Progress Notes (Signed)
Cardiology Office Note  Date: 03/23/2019   ID: Alainna, Karcher [birth year], MRN [id number]  PCP:  Glenda Chroman, MD  Cardiologist:  Rozann Lesches, MD Electrophysiologist:  None   Chief Complaint  Patient presents with  . Cardiac follow-up    History of Present Illness: Debra Richards is a 85 y.o. female last seen in November 2019.  She is here for a follow-up visit.  She tells me that over the last several months she has been experiencing worsening dyspnea on exertion even with typical ADLs.  She still tries to remain very active at age 50.  She has had intermittent discomfort in the sternal area, mainly at nighttime, not necessarily exertional.  I personally reviewed her ECG today which shows sinus rhythm with old left bundle branch block.  I reviewed her medications which are listed below.  She continues on aspirin, has not used any Lopressor for palpitations in quite some time.  She has no known history of ischemic heart disease.  Last structural cardiac evaluation was in October 2019 at which point LVEF was 60 to 65%.  She had a small pericardial effusion.  Past Medical History:  Diagnosis Date  . GERD (gastroesophageal reflux disease)   . History of kidney stones   . Hypothyroidism   . LBBB (left bundle branch block)   . MVP (mitral valve prolapse)   . Osteoporosis   . Palpitations   . RA (rheumatoid arthritis) ([place])   . Right ureteral stone   . Rosacea     Past Surgical History:  Procedure Laterality Date  . CATARACT EXTRACTION W/ INTRAOCULAR LENS  IMPLANT, BILATERAL  4 yrs ago   both eyes  . CHOLECYSTECTOMY  2005  . EXTRACORPOREAL SHOCK WAVE LITHOTRIPSY  09-24-10   and 1990  . Femur fx    . HIP ARTHROPLASTY Right 10/12/2012   Procedure: ARTHROPLASTY BIPOLAR HIP;  Surgeon: Gearlean Alf, MD;  Location: [facility];  Service: Orthopedics;  Laterality: Right;  . KNEE ARTHROSCOPY  04/17/2011, right   Procedure: ARTHROSCOPY KNEE;  Surgeon: Gearlean Alf;  Location: [place];  Service: Orthopedics;  Laterality: Right;  WITH LATERAL MENISCAL DEBRIDEMENT  . MOLES EXCISED  2012   on nose  . Sternum Fx    . TOTAL KNEE ARTHROPLASTY  03/23/2012   Procedure: TOTAL KNEE ARTHROPLASTY;  Surgeon: Gearlean Alf, MD;  [facility];  Service: Orthopedics;  Laterality: Right;  Marland Kitchen VAGINAL HYSTERECTOMY  1970   Leiomyomata    Current Outpatient Medications  Medication Sig Dispense Refill  . ASPIRIN 81 PO Take 1 tablet by mouth daily.     . cholecalciferol (VITAMIN D) 1000 units tablet Take 1,000 Units by mouth every morning.    . denosumab (PROLIA) 60 MG/ML SOLN injection Inject 60 mg into the skin every 6 (six) months. Administer in upper arm, thigh, or abdomen    . dexlansoprazole (DEXILANT) 60 MG capsule Take 60 mg by mouth daily.    Marland Kitchen HYDROcodone-acetaminophen (NORCO/VICODIN) 5-325 MG tablet One tablet every four hours as needed for acute pain.  Limit of five days per [place] statue. 30 tablet 0  . levothyroxine (SYNTHROID, LEVOTHROID) 75 MCG tablet Take 75 mcg by mouth daily before breakfast.    . vitamin E 400 UNIT capsule Take 400 Units by mouth every morning.      No current facility-administered medications for this visit.    Allergies:  Contrast media [iodinated diagnostic agents], Levaquin [  levofloxacin in d5w], Penicillins, and Ciprofloxacin   Social History: The patient  reports that she has never smoked. She has never used smokeless tobacco. She reports that she does not drink alcohol or use drugs.   ROS:  Please see the history of present illness. Otherwise, complete review of systems is positive for chronic back pain.  All other systems are reviewed and negative.   Physical Exam: VS:  BP 117/70   Pulse 62   Ht 5\' 5"  (1.651 m)   Wt 138 lb (62.6 kg)   SpO2 100%   BMI 22.96 kg/m , BMI Body mass index is 22.96 kg/m.  Wt Readings from Last 3 Encounters:  03/23/19 138 lb (62.6 kg)  01/27/19 135 lb (61.2  kg)  01/13/19 135 lb (61.2 kg)    General: Elderly woman, appears younger than stated age. HEENT: Conjunctiva and lids normal, wearing a mask. Neck: Supple, no elevated JVP or carotid bruits, no thyromegaly. Lungs: Clear to auscultation, nonlabored breathing at rest. Cardiac: Regular rate and rhythm, no S3, soft systolic murmur. Abdomen: Soft, nontender, bowel sounds present. Extremities: No pitting edema, distal pulses 2+. Skin: Warm and dry. Musculoskeletal: No kyphosis. Neuropsychiatric: Alert and oriented x3, affect grossly appropriate.  ECG:  An ECG dated 02/17/2018 was personally reviewed today and demonstrated:  Sinus rhythm with left bundle branch block.  Recent Labwork:  02/17/2018: ALT 13; AST 23; BUN 15; Creatinine, Ser 1.00; Hemoglobin 14.4; Magnesium 2.1; Platelets 142; Potassium 4.5; Sodium 142; TSH 2.180  Other Studies Reviewed Today:  Echocardiogram 03/12/2018: Study Conclusions  - Left ventricle: The cavity size was normal. Wall thickness was normal. Systolic function was normal. The estimated ejection fraction was in the range of 60% to 65%. Wall motion was normal; there were no regional wall motion abnormalities. Doppler parameters are consistent with abnormal left ventricular relaxation (grade 1 diastolic dysfunction). - Aortic valve: There was trivial regurgitation. - Mitral valve: There was mild regurgitation. - Pericardium, extracardiac: There was a small pericardial effusion. Features were not consistent with tamponade physiology.  Assessment and Plan:  1.  Dyspnea on exertion and intermittent chest pain as discussed above in a 85 year old woman with a history of chronic left bundle branch block, palpitations but no documented arrhythmias other than previous PACs, and reported mitral valve prolapse although with last echocardiogram not demonstrating this specifically.  She has no known history of ischemic heart disease.  States that symptoms  have progressed.  We will obtain an echocardiogram for follow-up cardiac structural assessment and a Lexiscan Myoview for ischemic evaluation. She remains quite functional for her age.  2.  Chronic left bundle branch block.  3.  Small pericardial effusion documented by echocardiogram in October of last year.  This will be reassessed as well.  Medication Adjustments/Labs and Tests Ordered: Current medicines are reviewed at length with the patient today.  Concerns regarding medicines are outlined above.   Tests Ordered: Orders Placed This Encounter  Procedures  . NM Myocar Multi W/Spect W/Wall Motion / EF  . EKG 12-Lead  . ECHOCARDIOGRAM COMPLETE    Medication Changes: No orders of the defined types were placed in this encounter.   Disposition:  Follow up test results and determine disposition.  Signed, Satira Sark, MD, [facility] 03/23/2019 2:04 PM    [facility] [place], [place], [place] [address] Phone: [phone]; Fax: [phone]

## 2019-03-23 NOTE — Patient Instructions (Addendum)
Medication Instructions:   Your physician recommends that you continue on your current medications as directed. Please refer to the Current Medication list given to you today.  Labwork:  NONE  Testing/Procedures: Your physician has requested that you have an echocardiogram. Echocardiography is a painless test that uses sound waves to create images of your heart. It provides your doctor with information about the size and shape of your heart and how well your heart's chambers and valves are working. This procedure takes approximately one hour. There are no restrictions for this procedure. Your physician has requested that you have a lexiscan myoview. For further information please visit [email or web address]. Please follow instruction sheet, as given.  Follow-Up:  Your physician recommends that you schedule a follow-up appointment in: pending.   Any Other Special Instructions Will Be Listed Below (If Applicable).  If you need a refill on your cardiac medications before your next appointment, please call your pharmacy. 

## 2019-03-24 ENCOUNTER — Telehealth: Payer: Self-pay | Admitting: Cardiology

## 2019-03-24 NOTE — Telephone Encounter (Signed)
Pre-cert Verification for the following procedure    Echo - 03-25-2019 at [place] - scheduled for 04-02-2019 at [facility]

## 2019-03-25 ENCOUNTER — Other Ambulatory Visit: Payer: Medicare Other

## 2019-03-29 ENCOUNTER — Telehealth: Payer: Self-pay | Admitting: Cardiology

## 2019-03-29 NOTE — Telephone Encounter (Signed)
Contacted patient and advised that the stress test she is having is a lexiscan and does not require walking on a treadmill. Verbalized understanding.

## 2019-03-29 NOTE — Telephone Encounter (Signed)
Patient called stating that she wants to cancel her stress test on Nov. 6th due to not being able to walk on a treadmill. Please advise patient.

## 2019-04-01 ENCOUNTER — Other Ambulatory Visit (HOSPITAL_COMMUNITY): Payer: Medicare Other

## 2019-04-02 ENCOUNTER — Encounter (HOSPITAL_COMMUNITY)
Admission: RE | Admit: 2019-04-02 | Discharge: 2019-04-02 | Disposition: A | Discharge status: Home / Self Care | Payer: Medicare Other | Source: Ambulatory Visit | Attending: Cardiology | Admitting: Cardiology

## 2019-04-02 ENCOUNTER — Other Ambulatory Visit: Payer: Self-pay

## 2019-04-02 ENCOUNTER — Encounter (HOSPITAL_BASED_OUTPATIENT_CLINIC_OR_DEPARTMENT_OTHER)
Admission: RE | Admit: 2019-04-02 | Discharge: 2019-04-02 | Disposition: A | Discharge status: Home / Self Care | Payer: Medicare Other | Source: Ambulatory Visit | Attending: Cardiology | Admitting: Cardiology

## 2019-04-02 DIAGNOSIS — R072 Precordial pain: Secondary | ICD-10-CM

## 2019-04-02 DIAGNOSIS — R0609 Other forms of dyspnea: Secondary | ICD-10-CM

## 2019-04-02 DIAGNOSIS — R0600 Dyspnea, unspecified: Secondary | ICD-10-CM

## 2019-04-02 LAB — NM MYOCAR MULTI W/SPECT W/WALL MOTION / EF
LV dias vol: 49 mL (ref 46–106)
LV sys vol: 13 mL
Peak HR: 97 {beats}/min
RATE: 0.42
Rest HR: 64 {beats}/min
SDS: 3
SRS: 4
SSS: 7
TID: 1.3

## 2019-04-02 MED ORDER — SODIUM CHLORIDE FLUSH 0.9 % IV SOLN
INTRAVENOUS | Status: AC
  Administered 2019-04-02: 12:00:00 10 mL via INTRAVENOUS
  Filled 2019-04-02: qty 10

## 2019-04-02 MED ORDER — TECHNETIUM TC 99M TETROFOSMIN IV KIT
30.0000 | PACK | Freq: Once | INTRAVENOUS | Status: AC | PRN
  Administered 2019-04-02: 30.6 via INTRAVENOUS

## 2019-04-02 MED ORDER — REGADENOSON 0.4 MG/5ML IV SOLN
INTRAVENOUS | Status: AC
  Administered 2019-04-02: 5 mL via INTRAVENOUS
  Filled 2019-04-02: qty 5

## 2019-04-02 MED ORDER — TECHNETIUM TC 99M TETROFOSMIN IV KIT
10.0000 | PACK | Freq: Once | INTRAVENOUS | Status: AC | PRN
  Administered 2019-04-02: 10.7 via INTRAVENOUS

## 2019-04-06 ENCOUNTER — Telehealth: Payer: Self-pay | Admitting: *Deleted

## 2019-04-06 MED ORDER — ISOSORBIDE MONONITRATE ER 30 MG PO TB24: 15.0000 mg | ORAL_TABLET | Freq: Every day | ORAL | 2 refills | Status: DC

## 2019-04-06 NOTE — Telephone Encounter (Signed)
Patient informed and verbalized understanding of plan. Copy sent to PCP 

## 2019-04-06 NOTE — Telephone Encounter (Signed)
-----   Message from Satira Sark, MD sent at 04/02/2019  4:00 PM EST ----- Results reviewed.  Stress test low risk but shows possible mild ischemic territory in the mid to basal inferoseptal wall versus variable soft tissue attenuation.  With symptoms reported, let's try low-dose Imdur starting at 15 mg once in the evening, stay on aspirin.  Schedule 31-month follow-up.

## 2019-04-07 ENCOUNTER — Ambulatory Visit (HOSPITAL_COMMUNITY): Payer: Medicare Other

## 2019-04-08 ENCOUNTER — Ambulatory Visit (HOSPITAL_COMMUNITY)
Admission: RE | Admit: 2019-04-08 | Discharge: 2019-04-08 | Disposition: A | Discharge status: Home / Self Care | Payer: Medicare Other | Source: Ambulatory Visit | Attending: Cardiology | Admitting: Cardiology

## 2019-04-08 ENCOUNTER — Other Ambulatory Visit: Payer: Self-pay

## 2019-04-08 DIAGNOSIS — R0609 Other forms of dyspnea: Secondary | ICD-10-CM

## 2019-04-08 DIAGNOSIS — R072 Precordial pain: Secondary | ICD-10-CM | POA: Diagnosis not present

## 2019-04-08 DIAGNOSIS — R0600 Dyspnea, unspecified: Secondary | ICD-10-CM | POA: Diagnosis not present

## 2019-04-08 NOTE — Progress Notes (Signed)
*  PRELIMINARY RESULTS* Echocardiogram 2D Echocardiogram has been performed.  Leavy Cella 04/08/2019, 3:38 PM

## 2019-04-09 ENCOUNTER — Telehealth: Payer: Self-pay | Admitting: *Deleted

## 2019-04-09 NOTE — Telephone Encounter (Signed)
-----   Message from Satira Sark, MD sent at 04/08/2019  3:56 PM EST ----- Results reviewed.  LVEF remains normal range at 55 to 60% and pericardial effusion is small as before.  Continue with current follow-up plan.

## 2019-04-09 NOTE — Telephone Encounter (Signed)
Patient informed. Copy sent to PCP °

## 2019-04-20 DIAGNOSIS — M48062 Spinal stenosis, lumbar region with neurogenic claudication: Secondary | ICD-10-CM | POA: Diagnosis not present

## 2019-04-20 DIAGNOSIS — M5416 Radiculopathy, lumbar region: Secondary | ICD-10-CM | POA: Diagnosis not present

## 2019-04-20 DIAGNOSIS — M47816 Spondylosis without myelopathy or radiculopathy, lumbar region: Secondary | ICD-10-CM | POA: Diagnosis not present

## 2019-04-20 DIAGNOSIS — M5126 Other intervertebral disc displacement, lumbar region: Secondary | ICD-10-CM | POA: Diagnosis not present

## 2019-04-20 DIAGNOSIS — M5136 Other intervertebral disc degeneration, lumbar region: Secondary | ICD-10-CM | POA: Diagnosis not present

## 2019-04-26 DIAGNOSIS — L820 Inflamed seborrheic keratosis: Secondary | ICD-10-CM | POA: Diagnosis not present

## 2019-04-26 DIAGNOSIS — D225 Melanocytic nevi of trunk: Secondary | ICD-10-CM | POA: Diagnosis not present

## 2019-04-26 DIAGNOSIS — L218 Other seborrheic dermatitis: Secondary | ICD-10-CM | POA: Diagnosis not present

## 2019-04-27 DIAGNOSIS — R0600 Dyspnea, unspecified: Secondary | ICD-10-CM | POA: Diagnosis not present

## 2019-04-27 DIAGNOSIS — Z299 Encounter for prophylactic measures, unspecified: Secondary | ICD-10-CM | POA: Diagnosis not present

## 2019-04-27 DIAGNOSIS — I313 Pericardial effusion (noninflammatory): Secondary | ICD-10-CM | POA: Diagnosis not present

## 2019-04-27 DIAGNOSIS — E039 Hypothyroidism, unspecified: Secondary | ICD-10-CM | POA: Diagnosis not present

## 2019-04-27 DIAGNOSIS — Z6820 Body mass index (BMI) 20.0-20.9, adult: Secondary | ICD-10-CM | POA: Diagnosis not present

## 2019-04-27 DIAGNOSIS — K219 Gastro-esophageal reflux disease without esophagitis: Secondary | ICD-10-CM | POA: Diagnosis not present

## 2019-04-29 ENCOUNTER — Other Ambulatory Visit: Payer: Self-pay | Admitting: Internal Medicine

## 2019-04-29 ENCOUNTER — Other Ambulatory Visit (HOSPITAL_COMMUNITY): Payer: Self-pay | Admitting: Internal Medicine

## 2019-04-29 DIAGNOSIS — R600 Localized edema: Secondary | ICD-10-CM

## 2019-05-03 ENCOUNTER — Other Ambulatory Visit (HOSPITAL_COMMUNITY): Payer: Self-pay | Admitting: Internal Medicine

## 2019-05-03 ENCOUNTER — Other Ambulatory Visit: Payer: Self-pay

## 2019-05-03 ENCOUNTER — Ambulatory Visit (HOSPITAL_COMMUNITY)
Admission: RE | Admit: 2019-05-03 | Discharge: 2019-05-03 | Disposition: A | Discharge status: Home / Self Care | Payer: Medicare Other | Source: Ambulatory Visit | Attending: Internal Medicine | Admitting: Internal Medicine

## 2019-05-03 DIAGNOSIS — R600 Localized edema: Secondary | ICD-10-CM

## 2019-05-03 DIAGNOSIS — R062 Wheezing: Secondary | ICD-10-CM | POA: Insufficient documentation

## 2019-05-03 DIAGNOSIS — R0600 Dyspnea, unspecified: Secondary | ICD-10-CM | POA: Diagnosis not present

## 2019-05-06 DIAGNOSIS — M5136 Other intervertebral disc degeneration, lumbar region: Secondary | ICD-10-CM | POA: Diagnosis not present

## 2019-05-06 DIAGNOSIS — M48061 Spinal stenosis, lumbar region without neurogenic claudication: Secondary | ICD-10-CM | POA: Diagnosis not present

## 2019-05-06 DIAGNOSIS — M4726 Other spondylosis with radiculopathy, lumbar region: Secondary | ICD-10-CM | POA: Diagnosis not present

## 2019-05-12 ENCOUNTER — Other Ambulatory Visit: Payer: Medicare Other

## 2019-06-01 ENCOUNTER — Other Ambulatory Visit: Payer: Self-pay

## 2019-06-01 ENCOUNTER — Ambulatory Visit (INDEPENDENT_AMBULATORY_CARE_PROVIDER_SITE_OTHER): Payer: Medicare Other | Admitting: Orthopaedic Surgery

## 2019-06-01 ENCOUNTER — Encounter: Payer: Self-pay | Admitting: Orthopaedic Surgery

## 2019-06-01 VITALS — BP 102/72 | HR 70 | Temp 97.2°F | Ht 65.0 in | Wt 138.5 lb

## 2019-06-01 DIAGNOSIS — G8929 Other chronic pain: Secondary | ICD-10-CM

## 2019-06-01 DIAGNOSIS — M5441 Lumbago with sciatica, right side: Secondary | ICD-10-CM | POA: Diagnosis not present

## 2019-06-01 DIAGNOSIS — M7061 Trochanteric bursitis, right hip: Secondary | ICD-10-CM

## 2019-06-01 DIAGNOSIS — M25551 Pain in right hip: Secondary | ICD-10-CM | POA: Diagnosis not present

## 2019-06-01 MED ORDER — HYDROCODONE-ACETAMINOPHEN 5-325 MG PO TABS: ORAL_TABLET | ORAL | 0 refills | Status: DC

## 2019-06-01 NOTE — Progress Notes (Signed)
PROCEDURE NOTE:  The patient request injection, verbal consent was obtained.  The right trochanteric area of the hip was prepped appropriately after time out was performed.   Sterile technique was observed and injection of 1 cc of Depo-Medrol 40 mg with several cc's of plain xylocaine. Anesthesia was provided by ethyl chloride and a 20-gauge needle was used to inject the hip area. The injection was tolerated well.  A band aid dressing was applied.  The patient was advised to apply ice later today and tomorrow to the injection sight as needed.  She has had epidurals recently. She may benefit from another one.  She will contact that physician.  I will see as needed.  Electronically Signed Sanjuana Kava, MD 1/5/20212:52 PM

## 2019-06-03 ENCOUNTER — Telehealth: Payer: Self-pay | Admitting: Orthopaedic Surgery

## 2019-06-03 DIAGNOSIS — G8929 Other chronic pain: Secondary | ICD-10-CM

## 2019-06-03 NOTE — Telephone Encounter (Signed)
Patient's daughter and designated contact Leverne Humbles [phone], had been seen by Dr Luna Glasgow 05/31/18 and advised to contact patient's neurosurgeon at [facility]; relays that patient is 'in excruciating pain' and that she cannot talk to anyone directly at their office. Asking if Dr Luna Glasgow can speak with Dr Sherwood Gambler.

## 2019-06-03 NOTE — Telephone Encounter (Signed)
I called, advised referral sent thru [facility] as urgent, will f/u on.

## 2019-06-14 ENCOUNTER — Encounter: Payer: Self-pay | Admitting: Urology

## 2019-06-17 DIAGNOSIS — M4726 Other spondylosis with radiculopathy, lumbar region: Secondary | ICD-10-CM | POA: Diagnosis not present

## 2019-06-17 DIAGNOSIS — M5136 Other intervertebral disc degeneration, lumbar region: Secondary | ICD-10-CM | POA: Diagnosis not present

## 2019-06-17 DIAGNOSIS — M48061 Spinal stenosis, lumbar region without neurogenic claudication: Secondary | ICD-10-CM | POA: Diagnosis not present

## 2019-07-05 ENCOUNTER — Telehealth: Payer: Self-pay | Admitting: Cardiology

## 2019-07-05 NOTE — Telephone Encounter (Signed)
Reports worsening SOB with activity. Denies dizziness, chest pain, cough, congestion, fever or headache. Reports not being able to taste or smell for the past 2 years. Medications reviewed. Appointment given to see Domenic Polite on 07/06/2019 @10 :20 am [place] office, and advised if symptoms got worse, to go to the ED for an evaluation. Verbalized understanding of plan.

## 2019-07-05 NOTE — Telephone Encounter (Signed)
Patient called stating that her shortness of breath continues to get worse. States that when she bends over she can hardly get her breath.

## 2019-07-06 ENCOUNTER — Encounter: Payer: Self-pay | Admitting: Cardiology

## 2019-07-06 ENCOUNTER — Ambulatory Visit (INDEPENDENT_AMBULATORY_CARE_PROVIDER_SITE_OTHER): Payer: Medicare Other | Admitting: Cardiology

## 2019-07-06 ENCOUNTER — Other Ambulatory Visit: Payer: Self-pay

## 2019-07-06 VITALS — BP 123/76 | HR 85 | Temp 97.0°F | Ht 65.0 in | Wt 139.0 lb

## 2019-07-06 DIAGNOSIS — I313 Pericardial effusion (noninflammatory): Secondary | ICD-10-CM | POA: Diagnosis not present

## 2019-07-06 DIAGNOSIS — I447 Left bundle-branch block, unspecified: Secondary | ICD-10-CM | POA: Diagnosis not present

## 2019-07-06 DIAGNOSIS — I3139 Other pericardial effusion (noninflammatory): Secondary | ICD-10-CM

## 2019-07-06 DIAGNOSIS — R0600 Dyspnea, unspecified: Secondary | ICD-10-CM | POA: Diagnosis not present

## 2019-07-06 DIAGNOSIS — R0609 Other forms of dyspnea: Secondary | ICD-10-CM

## 2019-07-06 NOTE — Progress Notes (Signed)
Cardiology Office Note  Date: 07/06/2019   ID: Dyllan, Shamis March 25, 1926, MRN [id number]  PCP:  Glenda Chroman, MD  Cardiologist:  Rozann Lesches, MD Electrophysiologist:  None   Chief Complaint  Patient presents with  . Shortness of Breath    History of Present Illness: Debra Richards is a 86 y.o. female last seen in October 2020.  She called to schedule a follow-up visit secondary to dyspnea on exertion.  She is here with her son today.  She continues to complain of dyspnea on exertion, describing NYHA class III symptoms with activity, just basic chores around the house.  No exertional chest tightness or palpitations.  Her son confirms that he sees her get short of breath when he comes over for lunch every day.  She feels like she is short of breath sitting and speaking, also has chronic back pain which may be a contributor.  These symptoms have been investigated.  Follow-up echocardiogram in November 2020 revealed LVEF 55 to 60% with mild LVH, mild diastolic dysfunction, normal RV contraction, no major valvular abnormalities, and a stable small pericardial effusion.  Lexiscan Myoview also obtained in November 2020 was low risk showing an area of soft tissue attenuation versus mild ischemia in the mid to basal inferoseptal wall.  She was placed on low-dose Imdur which has not impacted her symptoms.  She also had a chest x-ray in December per PCP which showed no acute findings.  We had her ambulate in the hall today, heart rate got into the mid 80s and her oxygen saturation was steady at 97 to 98%.  I talked with the patient and her son about how aggressively they wanted to pursue her symptoms from the perspective of potential ischemic heart disease.  She plans to talk with her daughter who is a Marine scientist, and will get back to [place] soon with a decision regarding whether a diagnostic right and left heart catheterization would be considered.  We did discuss the risks and benefits  today.  Past Medical History:  Diagnosis Date  . GERD (gastroesophageal reflux disease)   . History of kidney stones   . Hypothyroidism   . LBBB (left bundle branch block)   . MVP (mitral valve prolapse)   . Osteoporosis   . Palpitations   . RA (rheumatoid arthritis) ([place])   . Right ureteral stone   . Rosacea     Past Surgical History:  Procedure Laterality Date  . CATARACT EXTRACTION W/ INTRAOCULAR LENS  IMPLANT, BILATERAL  4 yrs ago   both eyes  . CHOLECYSTECTOMY  2005  . EXTRACORPOREAL SHOCK WAVE LITHOTRIPSY  09-24-10   and 1990  . Femur fx    . HIP ARTHROPLASTY Right 10/12/2012   Procedure: ARTHROPLASTY BIPOLAR HIP;  Surgeon: Gearlean Alf, MD;  Location: [facility];  Service: Orthopedics;  Laterality: Right;  . KNEE ARTHROSCOPY  04/17/2011, right   Procedure: ARTHROSCOPY KNEE;  Surgeon: Gearlean Alf;  Location: [facility];  Service: Orthopedics;  Laterality: Right;  WITH LATERAL MENISCAL DEBRIDEMENT  . MOLES EXCISED  2012   on nose  . Sternum Fx    . TOTAL KNEE ARTHROPLASTY  03/23/2012   Procedure: TOTAL KNEE ARTHROPLASTY;  Surgeon: Gearlean Alf, MD;  [facility]: [facility];  Service: Orthopedics;  Laterality: Right;  Marland Kitchen VAGINAL HYSTERECTOMY  1970   Leiomyomata    Current Outpatient Medications  Medication Sig Dispense Refill  . ASPIRIN 81 PO Take 1 tablet by  mouth daily.     . cholecalciferol (VITAMIN D) 1000 units tablet Take 1,000 Units by mouth every morning.    . denosumab (PROLIA) 60 MG/ML SOLN injection Inject 60 mg into the skin every 6 (six) months. Administer in upper arm, thigh, or abdomen    . HYDROcodone-acetaminophen (NORCO/VICODIN) 5-325 MG tablet One tablet every four hours as needed for acute pain.  Limit of five days per [place] statue. 30 tablet 0  . isosorbide mononitrate (IMDUR) 30 MG 24 hr tablet Take 0.5 tablets (15 mg total) by mouth daily. 45 tablet 2  . levothyroxine (SYNTHROID, LEVOTHROID) 75 MCG tablet Take 75 mcg by mouth  daily before breakfast.    . vitamin E 400 UNIT capsule Take 400 Units by mouth every morning.      No current facility-administered medications for this visit.   Allergies:  Contrast media [iodinated diagnostic agents], Levaquin [levofloxacin in d5w], Penicillins, and Ciprofloxacin   Social History: The patient  reports that she has never smoked. She has never used smokeless tobacco. She reports that she does not drink alcohol or use drugs.   Family History: The patient's family history includes Cancer in her mother; Crohn's disease in her daughter; Heart disease in her father; Ovarian cancer in her maternal grandmother; Stroke in her father.   ROS:  Please see the history of present illness. Otherwise, complete review of systems is positive for chronic back pain and leg pain.  All other systems are reviewed and negative.   Physical Exam: VS:  BP 123/76   Pulse 85   Temp (!) 97 F (36.1 C)   Ht 5\' 5"  (1.651 m)   Wt 139 lb (63 kg)   SpO2 98% Comment: walking hallways  BMI 23.13 kg/m , BMI Body mass index is 23.13 kg/m.  Wt Readings from Last 3 Encounters:  07/06/19 139 lb (63 kg)  06/01/19 138 lb 8 oz (62.8 kg)  03/23/19 138 lb (62.6 kg)    General: Elderly woman, appears younger than stated age, no distress. HEENT: Conjunctiva and lids normal, wearing a mask. Neck: Supple, no elevated JVP or carotid bruits, no thyromegaly. Lungs: Clear to auscultation, nonlabored breathing at rest. Cardiac: Regular rate and rhythm, no S3, soft systolic murmur, no pericardial rub. Abdomen: Soft, nontender, bowel sounds present. Extremities: No pitting edema, distal pulses 2+. Skin: Warm and dry. Musculoskeletal: No kyphosis. Neuropsychiatric: Alert and oriented x3, affect grossly appropriate.  ECG:  An ECG dated 03/23/2019 was personally reviewed today and demonstrated:  Sinus rhythm with left bundle branch block.  Recent Labwork:  02/17/2018: ALT 13; AST 23; BUN 15; Creatinine, Ser 1.00;  Hemoglobin 14.4; Magnesium 2.1; Platelets 142; Potassium 4.5; Sodium 142; TSH 2.180  Other Studies Reviewed Today:  Echocardiogram 04/08/2019: 1. Left ventricular ejection fraction, by visual estimation, is 55 to  60%. The left ventricle has normal function. There is mildly increased  left ventricular hypertrophy.  2. Abnormal septal motion consistent with left bundle branch block.  3. Left ventricular diastolic parameters are consistent with Grade I  diastolic dysfunction (impaired relaxation).  4. Global right ventricle has normal systolic function.The right  ventricular size is normal. No increase in right ventricular wall  thickness.  5. Left atrial size was normal.  6. Right atrial size was normal.  7. Small pericardial effusion.  8. The pericardial effusion is anterior to the right ventricle and  surrounding the apex.  9. Mild aortic valve annular calcification.  10. The mitral valve is grossly normal.  Trace mitral valve regurgitation.  11. The tricuspid valve is grossly normal. Tricuspid valve regurgitation  is trivial.  12. The aortic valve is tricuspid. Aortic valve regurgitation is not  visualized.  13. The pulmonic valve was grossly normal. Pulmonic valve regurgitation is  trivial.  14. TR signal is inadequate for assessing pulmonary artery systolic  pressure.  15. The inferior vena cava is normal in size with greater than 50%  respiratory variability, suggesting right atrial pressure of 3 mmHg.   Lexiscan Myoview 04/02/2019:  Left bundle branch block present throughout with rare PACs and PVCs.  Small, mild intensity, mid to basal inferoseptal defect that is partially reversible, suggestive of variable soft tissue attenuation versus mild ischemic territory. Increased TID ratio looks to be due to misregistration rather than actual dilatation.  This is a low risk study.  Nuclear stress EF: 73%.   Chest x-ray 05/03/2019: FINDINGS: The heart size and  mediastinal contours are within normal limits. No pneumothorax or pleural effusion is noted. Stable interstitial densities are noted throughout both lungs most consistent with scarring. No acute abnormality is noted. The visualized skeletal structures are unremarkable.  IMPRESSION: No active cardiopulmonary disease.  Assessment and Plan:  1.  Progressive dyspnea on exertion in a 86 year old woman, active for her age, noticing worsening symptoms over the last 6 months.  She reports evaluation by PCP with reassuring lab work and had a chest x-ray in December that showed no acute findings.  Echocardiography and Myoview studies from November 2020 are outlined above.  Although Myoview was low risk, ischemic heart disease was not excluded.  She has been treated with aspirin and Imdur, no improvement in symptoms.  She had no oxygen saturation with ambulating today and her heart rate got into the mid 80s, chronic left bundle branch block at baseline.  Patient plans to discuss with her family whether she would be in agreement to an diagnostic right and left heart catheterization.  We did discuss the risks and benefits.  She will let us know how she would like to proceed.  2.  Chronic left bundle branch block.  3.  Small pericardial effusion, stable by follow-up echocardiogram.  Medication Adjustments/Labs and Tests Ordered: Current medicines are reviewed at length with the patient today.  Concerns regarding medicines are outlined above.   Tests Ordered: No orders of the defined types were placed in this encounter.   Medication Changes: No orders of the defined types were placed in this encounter.   Disposition:  Follow up patient phone call regarding decision on testing.  Signed, Satira Sark, MD, [facility] 07/06/2019 10:52 AM    [facility] [place]. [address] [place], [place] [address] Phone: [phone]; Fax: [phone]

## 2019-07-06 NOTE — Patient Instructions (Signed)
Medication Instructions:  Your physician recommends that you continue on your current medications as directed. Please refer to the Current Medication list given to you today.  *If you need a refill on your cardiac medications before your next appointment, please call your pharmacy*  Lab Work: None  If you have labs (blood work) drawn today and your tests are completely normal, you will receive your results only by: Marland Kitchen MyChart Message (if you have MyChart) OR . A paper copy in the mail If you have any lab test that is abnormal or we need to change your treatment, we will call you to review the results.  Testing/Procedures: None  Follow-Up: At [facility], you and your health needs are our priority.  As part of our continuing mission to provide you with exceptional heart care, we have created designated Provider Care Teams.  These Care Teams include your primary Cardiologist (physician) and Advanced Practice Providers (APPs -  Physician Assistants and Nurse Practitioners) who all work together to provide you with the care you need, when you need it.  Your next appointment:  Please discuss heart cath with family.Call us to let us know your decision. [phone]        Thank you for choosing [facility] !

## 2019-07-12 ENCOUNTER — Telehealth: Payer: Self-pay

## 2019-07-12 NOTE — Telephone Encounter (Signed)
Please go ahead and schedule an office visit with Debra Richards in the next 3 to 4 weeks.  If she decides to call us back prior to that time, we can set up procedure, otherwise can at least talk with her again at follow-up.

## 2019-07-12 NOTE — Telephone Encounter (Signed)
Patients daughter and son in law are sick.She will have no help with heart cath.She will call us back when she wants to schedule cath.    I will FYI Dr.McDowell

## 2019-07-12 NOTE — Telephone Encounter (Signed)
07-12-19/Rec'd call from Debra Richards stating she is unable to have procedure as suggested by Dr.McDowell at this time. Debra Richards states she has a lot of family members who are sick, and will not have anyone able to be with her.  Thanks renee

## 2019-07-13 NOTE — Telephone Encounter (Signed)
Appointment made. Called pt to make her aware, no answer. Left message for pt to return call.

## 2019-07-20 DIAGNOSIS — Z6822 Body mass index (BMI) 22.0-22.9, adult: Secondary | ICD-10-CM | POA: Diagnosis not present

## 2019-07-20 DIAGNOSIS — K219 Gastro-esophageal reflux disease without esophagitis: Secondary | ICD-10-CM | POA: Diagnosis not present

## 2019-07-20 DIAGNOSIS — F419 Anxiety disorder, unspecified: Secondary | ICD-10-CM | POA: Diagnosis not present

## 2019-07-20 DIAGNOSIS — F320 Major depressive disorder, single episode, mild: Secondary | ICD-10-CM | POA: Diagnosis not present

## 2019-07-20 DIAGNOSIS — E039 Hypothyroidism, unspecified: Secondary | ICD-10-CM | POA: Diagnosis not present

## 2019-07-20 DIAGNOSIS — Z299 Encounter for prophylactic measures, unspecified: Secondary | ICD-10-CM | POA: Diagnosis not present

## 2019-07-21 NOTE — Telephone Encounter (Signed)
Patient returned my call.She states there is a lot of "sickness" in her house and she is not interested in pursuing a heart cath or apt with [place] at this time.I will cancel her March apt

## 2019-07-21 NOTE — Telephone Encounter (Signed)
Pt 08/11/2019 at 3 pm with B.Strader, PA-C, lm on daughters phone to verify they know about apt.

## 2019-07-23 ENCOUNTER — Telehealth: Payer: Self-pay | Admitting: *Deleted

## 2019-07-23 NOTE — Telephone Encounter (Signed)
Deductible $203($227met)  OOP MAX n/a   Annual exam 12/23/2018  Calcium 9.6            Date 02/17/2018  Upcoming dental procedures   Prior Authorization needed NO  Pt estimated Cost $0  APPT 07/28/2019     Coverage Details: 0% ONE DOSE,0% ADMIN FEE

## 2019-07-23 NOTE — Telephone Encounter (Signed)
Prol[employer] insurance verification has been sent awaiting Summary of benefits  

## 2019-07-26 ENCOUNTER — Other Ambulatory Visit: Payer: Self-pay

## 2019-07-28 ENCOUNTER — Ambulatory Visit (INDEPENDENT_AMBULATORY_CARE_PROVIDER_SITE_OTHER): Payer: Medicare Other | Admitting: Gynecology

## 2019-07-28 ENCOUNTER — Other Ambulatory Visit: Payer: Self-pay

## 2019-07-28 DIAGNOSIS — H43812 Vitreous degeneration, left eye: Secondary | ICD-10-CM | POA: Diagnosis not present

## 2019-07-28 DIAGNOSIS — Z961 Presence of intraocular lens: Secondary | ICD-10-CM | POA: Diagnosis not present

## 2019-07-28 DIAGNOSIS — M810 Age-related osteoporosis without current pathological fracture: Secondary | ICD-10-CM | POA: Diagnosis not present

## 2019-07-28 DIAGNOSIS — H40013 Open angle with borderline findings, low risk, bilateral: Secondary | ICD-10-CM | POA: Diagnosis not present

## 2019-07-28 MED ORDER — DENOSUMAB 60 MG/ML ~~LOC~~ SOSY
60.0000 mg | PREFILLED_SYRINGE | Freq: Once | SUBCUTANEOUS | Status: AC
  Administered 2019-07-28: 11:00:00 60 mg via SUBCUTANEOUS

## 2019-08-10 DIAGNOSIS — M545 Low back pain: Secondary | ICD-10-CM | POA: Diagnosis not present

## 2019-08-10 DIAGNOSIS — Z299 Encounter for prophylactic measures, unspecified: Secondary | ICD-10-CM | POA: Diagnosis not present

## 2019-08-10 DIAGNOSIS — Z713 Dietary counseling and surveillance: Secondary | ICD-10-CM | POA: Diagnosis not present

## 2019-08-10 DIAGNOSIS — M25551 Pain in right hip: Secondary | ICD-10-CM | POA: Diagnosis not present

## 2019-08-10 DIAGNOSIS — Z6822 Body mass index (BMI) 22.0-22.9, adult: Secondary | ICD-10-CM | POA: Diagnosis not present

## 2019-08-11 ENCOUNTER — Ambulatory Visit: Payer: Medicare Other | Admitting: Student

## 2019-08-17 DIAGNOSIS — M48062 Spinal stenosis, lumbar region with neurogenic claudication: Secondary | ICD-10-CM | POA: Diagnosis not present

## 2019-08-17 DIAGNOSIS — M5416 Radiculopathy, lumbar region: Secondary | ICD-10-CM | POA: Diagnosis not present

## 2019-08-17 DIAGNOSIS — M47816 Spondylosis without myelopathy or radiculopathy, lumbar region: Secondary | ICD-10-CM | POA: Diagnosis not present

## 2019-08-17 DIAGNOSIS — M5126 Other intervertebral disc displacement, lumbar region: Secondary | ICD-10-CM | POA: Diagnosis not present

## 2019-08-17 DIAGNOSIS — M5136 Other intervertebral disc degeneration, lumbar region: Secondary | ICD-10-CM | POA: Diagnosis not present

## 2019-09-22 DIAGNOSIS — Z299 Encounter for prophylactic measures, unspecified: Secondary | ICD-10-CM | POA: Diagnosis not present

## 2019-09-22 DIAGNOSIS — M25559 Pain in unspecified hip: Secondary | ICD-10-CM | POA: Diagnosis not present

## 2019-09-22 DIAGNOSIS — Z6822 Body mass index (BMI) 22.0-22.9, adult: Secondary | ICD-10-CM | POA: Diagnosis not present

## 2019-09-22 DIAGNOSIS — M25551 Pain in right hip: Secondary | ICD-10-CM | POA: Diagnosis not present

## 2019-10-04 NOTE — Progress Notes (Signed)
Cardiology Office Note  Date: 10/05/2019   ID: Debra Richards, DOB 09-Aug-1925, MRN [id number]  PCP:  Glenda Chroman, MD  Cardiologist:  Rozann Lesches, MD Electrophysiologist:  None   Chief Complaint  Patient presents with  . Cardiac follow-up    History of Present Illness: Debra Richards is a 86 y.o. female last seen in February.  She presents for a follow-up visit.  She continues to report dyspnea on exertion and fatigue.  We have discussed the possibility of a diagnostic cardiac catheterization, she has still not decided that she wants to pursue this however.  I reviewed her medications which are outlined below.  From a cardiac perspective she is on aspirin and low-dose Imdur.  Blood pressure and heart rate are normal today.  She does not describe any palpitations or syncope.  She does report chronic pain related to her hips, wonders whether this makes her short of breath with activity as well.  Past Medical History:  Diagnosis Date  . GERD (gastroesophageal reflux disease)   . History of kidney stones   . Hypothyroidism   . LBBB (left bundle branch block)   . MVP (mitral valve prolapse)   . Osteoporosis   . Palpitations   . RA (rheumatoid arthritis) ([place])   . Right ureteral stone   . Rosacea     Past Surgical History:  Procedure Laterality Date  . CATARACT EXTRACTION W/ INTRAOCULAR LENS  IMPLANT, BILATERAL  4 yrs ago   both eyes  . CHOLECYSTECTOMY  2005  . EXTRACORPOREAL SHOCK WAVE LITHOTRIPSY  09-24-10   and 1990  . Femur fx    . HIP ARTHROPLASTY Right 10/12/2012   Procedure: ARTHROPLASTY BIPOLAR HIP;  Surgeon: Gearlean Alf, MD;  Location: [facility];  Service: Orthopedics;  Laterality: Right;  . KNEE ARTHROSCOPY  04/17/2011, right   Procedure: ARTHROSCOPY KNEE;  Surgeon: Gearlean Alf;  Location: [facility];  Service: Orthopedics;  Laterality: Right;  WITH LATERAL MENISCAL DEBRIDEMENT  . MOLES EXCISED  2012   on nose  . Sternum Fx    .  TOTAL KNEE ARTHROPLASTY  03/23/2012   Procedure: TOTAL KNEE ARTHROPLASTY;  Surgeon: Gearlean Alf, MD;  Location: [facility];  Service: Orthopedics;  Laterality: Right;  Marland Kitchen VAGINAL HYSTERECTOMY  1970   Leiomyomata    Current Outpatient Medications  Medication Sig Dispense Refill  . amoxicillin (AMOXIL) 500 MG capsule Take 500 mg by mouth 3 (three) times daily.    . ASPIRIN 81 PO Take 1 tablet by mouth daily.     . cholecalciferol (VITAMIN D) 1000 units tablet Take 1,000 Units by mouth every morning.    . denosumab (PROLIA) 60 MG/ML SOLN injection Inject 60 mg into the skin every 6 (six) months. Administer in upper arm, thigh, or abdomen    . HYDROcodone-acetaminophen (NORCO/VICODIN) 5-325 MG tablet One tablet every four hours as needed for acute pain.  Limit of five days per [place] statue. 30 tablet 0  . isosorbide mononitrate (IMDUR) 30 MG 24 hr tablet Take 0.5 tablets (15 mg total) by mouth daily. 45 tablet 2  . levothyroxine (SYNTHROID, LEVOTHROID) 75 MCG tablet Take 75 mcg by mouth daily before breakfast.    . omeprazole (PRILOSEC) 40 MG capsule Take 40 mg by mouth daily.    . vitamin E 400 UNIT capsule Take 400 Units by mouth every morning.      No current facility-administered medications for this visit.   Allergies:  Contrast media [iodinated diagnostic agents], Levaquin [levofloxacin in d5w], Penicillins, and Ciprofloxacin   ROS:   Chronic hip pain.  Physical Exam: VS:  BP 110/80   Pulse 70   Ht 5\' 5"  (1.651 m)   Wt 138 lb 3.2 oz (62.7 kg)   SpO2 98%   BMI 23.00 kg/m , BMI Body mass index is 23 kg/m.  Wt Readings from Last 3 Encounters:  10/05/19 138 lb 3.2 oz (62.7 kg)  07/06/19 139 lb (63 kg)  06/01/19 138 lb 8 oz (62.8 kg)    General: Elderly woman, appears comfortable at rest. HEENT: Conjunctiva and lids normal, wearing a mask. Neck: Supple, no elevated JVP or carotid bruits, no thyromegaly. Lungs: Clear to auscultation, nonlabored breathing at rest. Cardiac:  Regular rate and rhythm, no S3, soft systolic murmur, no pericardial rub. Extremities: No pitting edema, distal pulses 2+.  ECG:  An ECG dated 03/23/2019 was personally reviewed today and demonstrated:  Sinus rhythm with left bundle branch block.  Recent Labwork:  02/17/2018: ALT 13; AST 23; BUN 15; Creatinine, Ser 1.00; Hemoglobin 14.4; Magnesium 2.1; Platelets 142; Potassium 4.5; Sodium 142; TSH 2.180  Other Studies Reviewed Today:  Echocardiogram 04/08/2019: 1. Left ventricular ejection fraction, by visual estimation, is 55 to  60%. The left ventricle has normal function. There is mildly increased  left ventricular hypertrophy.  2. Abnormal septal motion consistent with left bundle branch block.  3. Left ventricular diastolic parameters are consistent with Grade I  diastolic dysfunction (impaired relaxation).  4. Global right ventricle has normal systolic function.The right  ventricular size is normal. No increase in right ventricular wall  thickness.  5. Left atrial size was normal.  6. Right atrial size was normal.  7. Small pericardial effusion.  8. The pericardial effusion is anterior to the right ventricle and  surrounding the apex.  9. Mild aortic valve annular calcification.  10. The mitral valve is grossly normal. Trace mitral valve regurgitation.  11. The tricuspid valve is grossly normal. Tricuspid valve regurgitation  is trivial.  12. The aortic valve is tricuspid. Aortic valve regurgitation is not  visualized.  13. The pulmonic valve was grossly normal. Pulmonic valve regurgitation is  trivial.  14. TR signal is inadequate for assessing pulmonary artery systolic  pressure.  15. The inferior vena cava is normal in size with greater than 50%  respiratory variability, suggesting right atrial pressure of 3 mmHg.   Lexiscan Myoview 04/02/2019:  Left bundle branch block present throughout with rare PACs and PVCs.  Small, mild intensity, mid to basal  inferoseptal defect that is partially reversible, suggestive of variable soft tissue attenuation versus mild ischemic territory. Increased TID ratio looks to be due to misregistration rather than actual dilatation.  This is a low risk study.  Nuclear stress EF: 73%.   Chest x-ray 05/03/2019: FINDINGS: The heart size and mediastinal contours are within normal limits. No pneumothorax or pleural effusion is noted. Stable interstitial densities are noted throughout both lungs most consistent with scarring. No acute abnormality is noted. The visualized skeletal structures are unremarkable.  IMPRESSION: No active cardiopulmonary disease.  Assessment and Plan:  1.  Dyspnea on exertion and fatigue.  Echocardiogram and Myoview from November 2020 are outlined above.  We have continued aspirin and Imdur, also discussed further diagnostic options including cardiac catheterization which at this point she does not want to pursue.  We will continue with option observation for now.  2.  Chronic left bundle branch block by ECG.  3.  Small pericardial effusion by echocardiogram in November 2020, unlikely to be causing any active symptoms.  Medication Adjustments/Labs and Tests Ordered: Current medicines are reviewed at length with the patient today.  Concerns regarding medicines are outlined above.   Tests Ordered: No orders of the defined types were placed in this encounter.   Medication Changes: No orders of the defined types were placed in this encounter.   Disposition:  Follow up 3 months in the [place] office.  Signed, Satira Sark, MD, [facility] 10/05/2019 3:46 PM    [facility] at [place], [place], [place] [address] Phone: [phone]; Fax: [phone]

## 2019-10-05 ENCOUNTER — Other Ambulatory Visit: Payer: Self-pay

## 2019-10-05 ENCOUNTER — Encounter: Payer: Self-pay | Admitting: Cardiology

## 2019-10-05 ENCOUNTER — Ambulatory Visit (INDEPENDENT_AMBULATORY_CARE_PROVIDER_SITE_OTHER): Payer: Medicare Other | Admitting: Cardiology

## 2019-10-05 VITALS — BP 110/80 | HR 70 | Ht 65.0 in | Wt 138.2 lb

## 2019-10-05 DIAGNOSIS — R0600 Dyspnea, unspecified: Secondary | ICD-10-CM | POA: Diagnosis not present

## 2019-10-05 DIAGNOSIS — R0609 Other forms of dyspnea: Secondary | ICD-10-CM

## 2019-10-05 DIAGNOSIS — I447 Left bundle-branch block, unspecified: Secondary | ICD-10-CM | POA: Diagnosis not present

## 2019-10-05 NOTE — Patient Instructions (Signed)
Medication Instructions:   Your physician recommends that you continue on your current medications as directed. Please refer to the Current Medication list given to you today.  Labwork:  NONE  Testing/Procedures:  NONE  Follow-Up:  Your physician recommends that you schedule a follow-up appointment in: 3 months (office).  Any Other Special Instructions Will Be Listed Below (If Applicable).  If you need a refill on your cardiac medications before your next appointment, please call your pharmacy. 

## 2019-10-14 ENCOUNTER — Other Ambulatory Visit: Payer: Self-pay

## 2019-10-14 ENCOUNTER — Ambulatory Visit (INDEPENDENT_AMBULATORY_CARE_PROVIDER_SITE_OTHER): Payer: Medicare Other | Admitting: Orthopaedic Surgery

## 2019-10-14 ENCOUNTER — Encounter: Payer: Self-pay | Admitting: Orthopaedic Surgery

## 2019-10-14 VITALS — Ht 65.0 in | Wt 136.0 lb

## 2019-10-14 DIAGNOSIS — M7061 Trochanteric bursitis, right hip: Secondary | ICD-10-CM | POA: Diagnosis not present

## 2019-10-14 NOTE — Progress Notes (Signed)
PROCEDURE NOTE:  The patient request injection, verbal consent was obtained.  The right trochanteric area of the hip was prepped appropriately after time out was performed.   Sterile technique was observed and injection of 1 cc of Depo-Medrol 40 mg with several cc's of plain xylocaine. Anesthesia was provided by ethyl chloride and a 20-gauge needle was used to inject the hip area. The injection was tolerated well.  A band aid dressing was applied.  The patient was advised to apply ice later today and tomorrow to the injection sight as needed.  See prn  Electronically Signed Sanjuana Kava, MD 5/20/20212:00 PM

## 2019-10-18 ENCOUNTER — Telehealth: Payer: Self-pay | Admitting: Orthopaedic Surgery

## 2019-10-18 NOTE — Telephone Encounter (Signed)
Patient states her hip is still hurting - aware she has just had the injection last Wed, 10/13/19; states she followed the aftercare instructions. Asking if any other recommendations - including how soon to schedule another appointment. States uses [employer] if a medication prescribed.

## 2019-10-19 ENCOUNTER — Encounter: Payer: Self-pay | Admitting: Gynecology

## 2019-10-19 MED ORDER — PREDNISONE 5 MG (21) PO TBPK: ORAL_TABLET | ORAL | 0 refills | Status: DC

## 2019-10-19 NOTE — Telephone Encounter (Signed)
Patient has called back - due to still hurting, would like to have Dr Luna Glasgow order Prednisone by mouth as noted. [place].

## 2019-10-19 NOTE — Telephone Encounter (Signed)
I could call in prednisone by mouth.  Let me know.  Send new request if she wants it.

## 2019-10-21 NOTE — Progress Notes (Deleted)
Subjective:  1. Nocturia   2. Urge incontinence   3. Personal history of kidney stones       Incontinence   HPI: Debra Richards is a 86 year-old female established patient who is here for evaluation of incontinence.    The patient states the nature of her problem(s) is incontinence.  She was given Myrbetriq 50mg  at her last visit.    [name] returns today with the complaint of progressive incontinence over the last 3 years but it has been present for a decade. She has tried [place] and [employer] without success. She was given Myrbetriq samples and has done well with that but the medication is too expensive. She would like to get back on it. She has had PTNS and Botox discussed but hasn't done either. She has marked frequency and urgency and will have UUI. She doesn't have SUI. She has nocturia with enuresis. She has no dysuria or hematuria. She has a history of stones with occasional mild right flank pain. She last passed a stone 4+ years ago. She has DDD with chronic back pain. Her UA is ok.            ROS:  ROS:  A complete review of systems was performed.  All systems are negative except for pertinent findings as noted.   ROS  Allergies  Allergen Reactions  . Contrast Media [Iodinated Diagnostic Agents]     Arm swelled up and was painful after IVP years ago/   . Levaquin [Levofloxacin In D5w]     Made her sick all over  . Penicillins Hives    03/04/13: Pt has tolerated Amoxicillin/Unsyn without reaction  . Ciprofloxacin Rash    Outpatient Encounter Medications as of 10/22/2019  Medication Sig  . amoxicillin (AMOXIL) 500 MG capsule Take 500 mg by mouth 3 (three) times daily.  . ASPIRIN 81 PO Take 1 tablet by mouth daily.   . cholecalciferol (VITAMIN D) 1000 units tablet Take 1,000 Units by mouth every morning.  . denosumab (PROLIA) 60 MG/ML SOLN injection Inject 60 mg into the skin every 6 (six) months. Administer in upper arm, thigh, or abdomen  .  HYDROcodone-acetaminophen (NORCO/VICODIN) 5-325 MG tablet One tablet every four hours as needed for acute pain.  Limit of five days per [place] statue.  . isosorbide mononitrate (IMDUR) 30 MG 24 hr tablet Take 0.5 tablets (15 mg total) by mouth daily.  Marland Kitchen levothyroxine (SYNTHROID, LEVOTHROID) 75 MCG tablet Take 75 mcg by mouth daily before breakfast.  . omeprazole (PRILOSEC) 40 MG capsule Take 40 mg by mouth daily.  . predniSONE (STERAPRED UNI-PAK 21 TAB) 5 MG (21) TBPK tablet Take 6 pills first day; 5 pills second day; 4 pills third day; 3 pills fourth day; 2 pills next day and 1 pill last day.  . vitamin E 400 UNIT capsule Take 400 Units by mouth every morning.    No facility-administered encounter medications on file as of 10/22/2019.    Past Medical History:  Diagnosis Date  . GERD (gastroesophageal reflux disease)   . History of kidney stones   . Hypothyroidism   . LBBB (left bundle branch block)   . MVP (mitral valve prolapse)   . Osteoporosis   . Palpitations   . RA (rheumatoid arthritis) ([place])   . Right ureteral stone   . Rosacea     Past Surgical History:  Procedure Laterality Date  . CATARACT EXTRACTION W/ INTRAOCULAR LENS  IMPLANT, BILATERAL  4 yrs ago   both  eyes  . CHOLECYSTECTOMY  2005  . EXTRACORPOREAL SHOCK WAVE LITHOTRIPSY  09-24-10   and 1990  . Femur fx    . HIP ARTHROPLASTY Right 10/12/2012   Procedure: ARTHROPLASTY BIPOLAR HIP;  Surgeon: Gearlean Alf, MD;  Location: [facility];  Service: Orthopedics;  Laterality: Right;  . KNEE ARTHROSCOPY  04/17/2011, right   Procedure: ARTHROSCOPY KNEE;  Surgeon: Gearlean Alf;  Location: [facility];  Service: Orthopedics;  Laterality: Right;  WITH LATERAL MENISCAL DEBRIDEMENT  . MOLES EXCISED  2012   on nose  . Sternum Fx    . TOTAL KNEE ARTHROPLASTY  03/23/2012   Procedure: TOTAL KNEE ARTHROPLASTY;  Surgeon: Gearlean Alf, MD;  Location: [facility];  Service: Orthopedics;  Laterality: Right;  Marland Kitchen VAGINAL  HYSTERECTOMY  1970   Leiomyomata    Social History   Socioeconomic History  . Marital status: Legally Separated    Spouse name: Not on file  . Number of children: Not on file  . Years of education: Not on file  . Highest education level: Not on file  Occupational History  . Not on file  Tobacco Use  . Smoking status: Never Smoker  . Smokeless tobacco: Never Used  Substance and Sexual Activity  . Alcohol use: No    Alcohol/week: 0.0 standard drinks  . Drug use: No  . Sexual activity: Not Currently    Birth control/protection: Post-menopausal, Surgical    Comment: 1st intercourse 75 yo-2 partners  Other Topics Concern  . Not on file  Social History Narrative   Lives in [place].   Lives c 2nd husband    NOK-Gina Axson   Social Determinants of Health   Financial Resource Strain:   . Difficulty of Paying Living Expenses:   Food Insecurity:   . Worried About Charity fundraiser in the Last Year:   . Arboriculturist in the Last Year:   Transportation Needs:   . Film/video editor (Medical):   Marland Kitchen Lack of Transportation (Non-Medical):   Physical Activity:   . Days of Exercise per Week:   . Minutes of Exercise per Session:   Stress:   . Feeling of Stress :   Social Connections:   . Frequency of Communication with Friends and Family:   . Frequency of Social Gatherings with Friends and Family:   . Attends Religious Services:   . Active Member of Clubs or Organizations:   . Attends Archivist Meetings:   Marland Kitchen Marital Status:   Intimate Partner Violence:   . Fear of Current or Ex-Partner:   . Emotionally Abused:   Marland Kitchen Physically Abused:   . Sexually Abused:     Family History  Problem Relation Age of Onset  . Cancer Mother        COLON  . Heart disease Father        STROKE  . Stroke Father   . Ovarian cancer Maternal Grandmother   . Crohn's disease Daughter        Objective: There were no vitals filed for this visit.   Physical Exam  Lab Results:   No results found for this or any previous visit (from the past 24 hour(s)).  BMET No results for input(s): NA, K, CL, CO2, GLUCOSE, BUN, CREATININE, CALCIUM in the last 72 hours. PSA No results found for: PSA No results found for: TESTOSTERONE    Studies/Results: No results found.    Assessment & Plan: No problem-specific Assessment &  Plan notes found for this encounter.    No orders of the defined types were placed in this encounter.    No orders of the defined types were placed in this encounter.     No follow-ups on file.   CC: Glenda Chroman, MD      Irine Seal 10/21/2019

## 2019-10-22 ENCOUNTER — Ambulatory Visit: Payer: Medicare Other | Admitting: Urology

## 2019-10-27 NOTE — Telephone Encounter (Signed)
Done per Dr Luna Glasgow.

## 2019-11-02 ENCOUNTER — Encounter: Payer: Self-pay | Admitting: Orthopaedic Surgery

## 2019-11-02 ENCOUNTER — Other Ambulatory Visit: Payer: Self-pay

## 2019-11-02 ENCOUNTER — Ambulatory Visit (INDEPENDENT_AMBULATORY_CARE_PROVIDER_SITE_OTHER): Payer: Medicare Other | Admitting: Orthopaedic Surgery

## 2019-11-02 VITALS — BP 107/58 | HR 81 | Ht 65.0 in | Wt 137.8 lb

## 2019-11-02 DIAGNOSIS — G8929 Other chronic pain: Secondary | ICD-10-CM

## 2019-11-02 DIAGNOSIS — M7061 Trochanteric bursitis, right hip: Secondary | ICD-10-CM

## 2019-11-02 DIAGNOSIS — M5441 Lumbago with sciatica, right side: Secondary | ICD-10-CM

## 2019-11-02 MED ORDER — TRAMADOL HCL 50 MG PO TABS: 50.0000 mg | ORAL_TABLET | Freq: Four times a day (QID) | ORAL | 0 refills | Status: AC | PRN

## 2019-11-02 NOTE — Progress Notes (Signed)
Patient ZO:XWRUEAV Debra Richards, female DOB:08/31/1925, 86 y.o. WUJ:[id number]  Chief Complaint  Patient presents with  . Hip Pain    right    HPI  Debra Richards is a 86 y.o. female who has right hip pain and marked paresthesias to the right foot and pain from the lower back.  She saw Dr. Jerene Bears and had epidural injections times three in November, December and January of last year.  She has MRI showing HNP of lower back.  She is not getting better.  He has now retired.  His partner suggested pain clinic.  Her quality is life is not good. She hurts all the time.  She has symptoms of right sided HNP at L4-L5.    I will have her seen at [facility] for evaluation.  I know her age is against her but she is active, mentally alert and has no major medical problem.  I will inject her right hip trochanteric bursitis today.  I will give Ultram for pain.   Body mass index is 22.93 kg/m.  ROS  Review of Systems  Constitutional: Positive for activity change.  Musculoskeletal: Positive for back pain, gait problem and joint swelling.  All other systems reviewed and are negative.   All other systems reviewed and are negative.  The following is a summary of the past history medically, past history surgically, known current medicines, social history and family history.  This information is gathered electronically by the computer from prior information and documentation.  I review this each visit and have found including this information at this point in the chart is beneficial and informative.    Past Medical History:  Diagnosis Date  . GERD (gastroesophageal reflux disease)   . History of kidney stones   . Hypothyroidism   . LBBB (left bundle branch block)   . MVP (mitral valve prolapse)   . Osteoporosis   . Palpitations   . RA (rheumatoid arthritis) ([place])   . Right ureteral stone   . Rosacea     Past Surgical History:  Procedure Laterality Date  . CATARACT EXTRACTION W/ INTRAOCULAR  LENS  IMPLANT, BILATERAL  4 yrs ago   both eyes  . CHOLECYSTECTOMY  2005  . EXTRACORPOREAL SHOCK WAVE LITHOTRIPSY  09-24-10   and 1990  . Femur fx    . HIP ARTHROPLASTY Right 10/12/2012   Procedure: ARTHROPLASTY BIPOLAR HIP;  Surgeon: Gearlean Alf, MD;  [facility]: [facility];  Service: Orthopedics;  Laterality: Right;  . KNEE ARTHROSCOPY  04/17/2011, right   Procedure: ARTHROSCOPY KNEE;  Surgeon: Gearlean Alf;  Location: [facility];  Service: Orthopedics;  Laterality: Right;  WITH LATERAL MENISCAL DEBRIDEMENT  . MOLES EXCISED  2012   on nose  . Sternum Fx    . TOTAL KNEE ARTHROPLASTY  03/23/2012   Procedure: TOTAL KNEE ARTHROPLASTY;  Surgeon: Gearlean Alf, MD;  Location[facility];  Service: Orthopedics;  Laterality: Right;  Marland Kitchen VAGINAL HYSTERECTOMY  1970   Leiomyomata    Family History  Problem Relation Age of Onset  . Cancer Mother        COLON  . Heart disease Father        STROKE  . Stroke Father   . Ovarian cancer Maternal Grandmother   . Crohn's disease Daughter     Social History Social History   Tobacco Use  . Smoking status: Never Smoker  . Smokeless tobacco: Never Used  Substance Use Topics  . Alcohol use: No  Alcohol/week: 0.0 standard drinks  . Drug use: No    Allergies  Allergen Reactions  . Contrast Media [Iodinated Diagnostic Agents]     Arm swelled up and was painful after IVP years ago/   . Levaquin [Levofloxacin In D5w]     Made her sick all over  . Penicillins Hives    03/04/13: Pt has tolerated Amoxicillin/Unsyn without reaction  . Ciprofloxacin Rash    Current Outpatient Medications  Medication Sig Dispense Refill  . acetaminophen (TYLENOL) 650 MG CR tablet Take 1,300 mg by mouth every 8 (eight) hours as needed for pain.    . ASPIRIN 81 PO Take 1 tablet by mouth daily.     . cholecalciferol (VITAMIN D) 1000 units tablet Take 1,000 Units by mouth every morning.    . denosumab (PROLIA) 60 MG/ML SOLN injection Inject 60 mg  into the skin every 6 (six) months. Administer in upper arm, thigh, or abdomen    . HYDROcodone-acetaminophen (NORCO/VICODIN) 5-325 MG tablet One tablet every four hours as needed for acute pain.  Limit of five days per [place] statue. 30 tablet 0  . isosorbide mononitrate (IMDUR) 30 MG 24 hr tablet Take 0.5 tablets (15 mg total) by mouth daily. 45 tablet 2  . levothyroxine (SYNTHROID, LEVOTHROID) 75 MCG tablet Take 75 mcg by mouth daily before breakfast.    . omeprazole (PRILOSEC) 40 MG capsule Take 40 mg by mouth daily.    . vitamin E 400 UNIT capsule Take 400 Units by mouth every morning.     . traMADol (ULTRAM) 50 MG tablet Take 1 tablet (50 mg total) by mouth every 6 (six) hours as needed for up to 5 days. One tablet every six hours as needed for pain. 20 tablet 0   No current facility-administered medications for this visit.     Physical Exam  Blood pressure (!) 107/58, pulse 81, height 5\' 5"  (1.651 m), weight 137 lb 12.8 oz (62.5 kg).  Constitutional: overall normal hygiene, normal nutrition, well developed, normal grooming, normal body habitus. Assistive device:none  Musculoskeletal: gait and station Limp right, muscle tone and strength are normal, no tremors or atrophy is present.  .  Neurological: coordination overall normal.  Deep tendon reflex/nerve stretch intact.  Sensation normal.  Cranial nerves II-XII intact.   Skin:   Normal overall no scars, lesions, ulcers or rashes. No psoriasis.  Psychiatric: Alert and oriented x 3.  Recent memory intact, remote memory unclear.  Normal mood and affect. Well groomed.  Good eye contact.  Cardiovascular: overall no swelling, no varicosities, no edema bilaterally, normal temperatures of the legs and arms, no clubbing, cyanosis and good capillary refill.  Lymphatic: palpation is normal.  Right hip is very tender over the trochanteric bursa.  All other systems reviewed and are negative   The patient has been educated about the  nature of the problem(s) and counseled on treatment options.  The patient appeared to understand what I have discussed and is in agreement with it.  Encounter Diagnoses  Name Primary?  . Chronic right-sided low back pain with right-sided sciatica Yes  . Trochanteric bursitis, right hip    PROCEDURE NOTE:  The patient request injection, verbal consent was obtained.  The right trochanteric area of the hip was prepped appropriately after time out was performed.   Sterile technique was observed and injection of 1 cc of Depo-Medrol 40 mg with several cc's of plain xylocaine. Anesthesia was provided by ethyl chloride and a 20-gauge needle was  used to inject the hip area. The injection was tolerated well.  A band aid dressing was applied.  The patient was advised to apply ice later today and tomorrow to the injection sight as needed.  PLAN Call if any problems.  Precautions discussed.  Continue current medications.   Return to clinic 1 month   To [facility] for the lower back nerve root problem.   Rx for Ultram called in.  I have reviewed the [place] web site prior to prescribing narcotic medicine for this patient.   Electronically Signed Sanjuana Kava, MD 6/8/20213:26 PM

## 2019-11-17 ENCOUNTER — Other Ambulatory Visit (HOSPITAL_COMMUNITY): Payer: Self-pay | Admitting: Neurosurgery

## 2019-11-17 ENCOUNTER — Other Ambulatory Visit: Payer: Self-pay | Admitting: Neurosurgery

## 2019-11-17 DIAGNOSIS — M5126 Other intervertebral disc displacement, lumbar region: Secondary | ICD-10-CM

## 2019-11-30 ENCOUNTER — Encounter (HOSPITAL_COMMUNITY): Payer: Self-pay

## 2019-11-30 ENCOUNTER — Other Ambulatory Visit (HOSPITAL_COMMUNITY): Payer: Medicare Other

## 2019-12-07 NOTE — Telephone Encounter (Signed)
PROLIA GIVEN 07/28/2019 NEXT INJECTION 01/29/2020

## 2019-12-08 DIAGNOSIS — Z79899 Other long term (current) drug therapy: Secondary | ICD-10-CM | POA: Diagnosis not present

## 2019-12-08 DIAGNOSIS — E039 Hypothyroidism, unspecified: Secondary | ICD-10-CM | POA: Diagnosis not present

## 2019-12-08 DIAGNOSIS — E7800 Pure hypercholesterolemia, unspecified: Secondary | ICD-10-CM | POA: Diagnosis not present

## 2019-12-10 ENCOUNTER — Other Ambulatory Visit: Payer: Self-pay | Admitting: Neurosurgery

## 2019-12-21 ENCOUNTER — Ambulatory Visit (HOSPITAL_COMMUNITY): Payer: Medicare Other

## 2019-12-24 ENCOUNTER — Telehealth: Payer: Self-pay | Admitting: *Deleted

## 2019-12-24 ENCOUNTER — Other Ambulatory Visit: Payer: Self-pay

## 2019-12-24 ENCOUNTER — Telehealth: Payer: Self-pay | Admitting: General Practice

## 2019-12-24 ENCOUNTER — Encounter (HOSPITAL_COMMUNITY): Payer: Self-pay

## 2019-12-24 ENCOUNTER — Encounter (HOSPITAL_COMMUNITY)
Admission: RE | Admit: 2019-12-24 | Discharge: 2019-12-24 | Disposition: A | Discharge status: Home / Self Care | Payer: Medicare Other | Source: Ambulatory Visit | Attending: Neurosurgery | Admitting: Neurosurgery

## 2019-12-24 ENCOUNTER — Other Ambulatory Visit (HOSPITAL_COMMUNITY)
Admission: RE | Admit: 2019-12-24 | Discharge: 2019-12-24 | Disposition: A | Discharge status: Home / Self Care | Payer: Medicare Other | Source: Ambulatory Visit | Attending: Neurosurgery | Admitting: Neurosurgery

## 2019-12-24 DIAGNOSIS — M810 Age-related osteoporosis without current pathological fracture: Secondary | ICD-10-CM

## 2019-12-24 DIAGNOSIS — Z20822 Contact with and (suspected) exposure to covid-19: Secondary | ICD-10-CM | POA: Insufficient documentation

## 2019-12-24 DIAGNOSIS — Z01812 Encounter for preprocedural laboratory examination: Secondary | ICD-10-CM | POA: Insufficient documentation

## 2019-12-24 HISTORY — DX: Other complications of anesthesia, initial encounter: T88.59XA

## 2019-12-24 LAB — BASIC METABOLIC PANEL
Anion gap: 8 (ref 5–15)
BUN: 17 mg/dL (ref 8–23)
CO2: 25 mmol/L (ref 22–32)
Calcium: 9.6 mg/dL (ref 8.9–10.3)
Chloride: 107 mmol/L (ref 98–111)
Creatinine, Ser: 0.77 mg/dL (ref 0.44–1.00)
GFR calc Af Amer: >60 mL/min (ref >60)
GFR calc non Af Amer: >60 mL/min (ref >60)
Glucose, Bld: 91 mg/dL (ref 70–99)
Potassium: 4.3 mmol/L (ref 3.5–5.1)
Sodium: 140 mmol/L (ref 135–145)

## 2019-12-24 LAB — CBC
HCT: 42.8 % (ref 36.0–46.0)
Hemoglobin: 13.4 g/dL (ref 12.0–15.0)
MCH: 32.1 pg (ref 26.0–34.0)
MCHC: 31.3 g/dL (ref 30.0–36.0)
MCV: 102.4 fL — ABNORMAL HIGH (ref 80.0–100.0)
Platelets: 159 10*3/uL (ref 150–400)
RBC: 4.18 MIL/uL (ref 3.87–5.11)
RDW: 13.2 % (ref 11.5–15.5)
WBC: 4.1 10*3/uL (ref 4.0–10.5)
nRBC: 0 % (ref 0.0–0.2)

## 2019-12-24 LAB — SURGICAL PCR SCREEN
MRSA, PCR: NEGATIVE
Staphylococcus aureus: NEGATIVE

## 2019-12-24 LAB — SARS CORONAVIRUS 2 (TAT 6-24 HRS): SARS Coronavirus 2: NEGATIVE

## 2019-12-24 NOTE — Telephone Encounter (Signed)
Called and spoke with pt, scheduled an appt in [place] by pt's request on 01/27/2020@10 :20 AM  will forward to requesting providers office

## 2019-12-24 NOTE — Telephone Encounter (Signed)
Primary Cardiologist:Samuel Domenic Polite, MD  Chart reviewed as part of pre-operative protocol coverage. Because of Debra Richards's past medical history and time since last visit, he/she will require a follow-up visit in order to better assess preoperative cardiovascular risk.  Pre-op covering staff: - Please schedule appointment and call patient to inform them. - Please contact requesting surgeon's office via preferred method (i.e, phone, fax) to inform them of need for appointment prior to surgery.  If applicable, this message will also be routed to pharmacy pool and/or primary cardiologist for input on holding anticoagulant/antiplatelet agent as requested below so that this information is available at time of patient's appointment.   [name], NP  12/24/2019, 2:40 PM

## 2019-12-24 NOTE — Progress Notes (Signed)
PCP - Dr. Woody Seller in [facility] Cardiologist - Dr. Domenic Polite in [place]  Chest x-ray - 05/03/19 EKG - 03/23/19 Stress Test - 02/26/12 ECHO - 04/08/19 Cardiac Cath - Denies  Sleep Study - Denies  DM - Denies  Aspirin Instructions: Last dose on 12/20/19  COVID TEST- 12/24/19  Anesthesia review: Yes Trouble waking from prior surgery   Patient denies shortness of breath, fever, cough and chest pain at PAT appointment   All instructions explained to the patient, with a verbal understanding of the material. Patient agrees to go over the instructions while at home for a better understanding. Patient also instructed to self quarantine after being tested for COVID-19. The opportunity to ask questions was provided.

## 2019-12-24 NOTE — Telephone Encounter (Signed)
° °  [facility] Pre-operative Risk Assessment    HEARTCARE STAFF: - Please ensure there is not already an duplicate clearance open for this procedure. - Under Visit Info/Reason for Call, type in Other and utilize the format Clearance MM/DD/YY or Clearance TBD. Do not use dashes or single digits. - If request is for dental extraction, please clarify the # of teeth to be extracted.  Request for surgical clearance:  1. What type of surgery is being performed? Right L4-5  L5-S1 Microdiscectomy  2. When is this surgery scheduled? 12/28/2019   3. What type of clearance is required (medical clearance vs. Pharmacy clearance to hold med vs. Both)? Both  4. Are there any medications that need to be held prior to surgery and how long? ASA   5. Practice name and name of physician performing surgery? [facility] Dr. Erline Levine  6. What is the office phone number? [phone]   7.   What is the office fax number? [phone]  8.   Anesthesia type (None, local, MAC, general) ? General   Marlou Sa 12/24/2019, 12:20 PM  _________________________________________________________________   (provider comments below)

## 2019-12-24 NOTE — Telephone Encounter (Signed)
° °  Primary Cardiologist: Rozann Lesches, MD  Chart reviewed as part of pre-operative protocol coverage. Given past medical history and time since last visit, based on ACC/AHA guidelines, Debra Richards would be at acceptable risk for the planned procedure without further cardiovascular testing.   Case discussed with Dr. Domenic Polite.  Patient believed to be intermediate risk due to her current dyspnea on exertion and fatigue.  She wished to defer ischemic evaluation.  Her RCRI is a class III risk, 6.6% risk of major cardiac event.  I will route this recommendation to the requesting party via Epic fax function and remove from pre-op pool.  Please call with questions.  Jossie Ng. Kayne Yuhas NP-C    12/24/2019, [facility] [address] Office [phone] Fax [phone]

## 2019-12-24 NOTE — Telephone Encounter (Addendum)
Deductible $093($267 MET)  OOP MAX N/A  Appt  01/2020 ml discuss osteoporosis  Calcium per DR. Lavoie note 09/05/2020 Ca++ 01/2020 normal at 9.5    Upcoming dental procedures NO  Prior Authorization needed NO  Pt estimated Cost $0   WILL SCHEDULE PROLIA AFTER LABS COME BACK    Coverage Details: $ ONE DOSE, $0 ADMIN FEE

## 2019-12-24 NOTE — Pre-Procedure Instructions (Signed)
MIRENDA BALTAZAR  12/24/2019    Your procedure is scheduled on Tuesday, December 28, 2019 at [facility]   Report to [facility] Entrance "A" Admitting Office  at 10:00 AM.   Call this number if you have problems the morning of surgery: [phone]   Questions prior to day of surgery, please call [phone] between 8 & 4 PM.   Remember:  Do not eat or drink after midnight Monday, 12/27/19.  Take these medicines the morning of surgery with A SIP OF WATER: Isosorbide Mononitrate (Imdur), Levothyroxine (Synthroid), Omeprazole (Prilosec), Tramadol (Ultram) - if needed, eye drops - if needed  Stop Aspirin as instructed by cardiologist/surgeon. Stop all Vitamins as of today prior to surgery. Do not use Aspirin containing products, NSAIDS (Ibuprofen, Aleve, etc), Herbal medications or Fish Oil prior to surgery.    Do not wear jewelry, make-up or nail polish.  Do not wear lotions, powders, perfumes or deodorant.  Do not shave 48 hours prior to surgery.    Do not bring valuables to the hospital.  [facility] is not responsible for any belongings or valuables.  Contacts, dentures or bridgework may not be worn into surgery.  Leave your suitcase in the car.  After surgery it may be brought to your room.  For patients admitted to the hospital, discharge time will be determined by your treatment team.  Patients discharged the day of surgery will not be allowed to drive home.   [place] - Preparing for Surgery  Before surgery, you can play an important role.  Because skin is not sterile, your skin needs to be as free of germs as possible.  You can reduce the number of germs on you skin by washing with CHG (chlorahexidine gluconate) soap before surgery.  CHG is an antiseptic cleaner which kills germs and bonds with the skin to continue killing germs even after washing.  Oral Hygiene is also important in reducing the risk of infection.  Remember to brush your teeth with your regular toothpaste  the morning of surgery.  Please DO NOT use if you have an allergy to CHG or antibacterial soaps.  If your skin becomes reddened/irritated stop using the CHG and inform your nurse when you arrive at Short Stay.  Do not shave (including legs and underarms) for at least 48 hours prior to the first CHG shower.  You may shave your face.  Please follow these instructions carefully:   1.  Shower with CHG Soap the night before surgery and the morning of Surgery.  2.  If you choose to wash your hair, wash your hair first as usual with your normal shampoo.  3.  After you shampoo, rinse your hair and body thoroughly to remove the shampoo. 4.  Use CHG as you would any other liquid soap.  You can apply chg directly to the skin and wash gently with a      scrungie or washcloth.           5.  Apply the CHG Soap to your body ONLY FROM THE NECK DOWN.   Do not use on open wounds or open sores. Avoid contact with your eyes, ears, mouth and genitals (private parts).  Wash genitals (private parts) with your normal soap - do this prior to using CHG soap.  6.  Wash thoroughly, paying special attention to the area where your surgery will be performed.  7.  Thoroughly rinse your body with warm water from the neck down.  8.  DO NOT shower/wash with your normal soap after using and rinsing off the CHG Soap.  9.  Pat yourself dry with a clean towel.            10.  Wear clean pajamas.            11.  Place clean sheets on your bed the night of your first shower and do not sleep with pets.  Day of Surgery  Shower as above.  Do not apply any lotions/deodorants the morning of surgery.   Please wear clean clothes to the hospital. Remember to brush your teeth with toothpaste.  Please read over the fact sheets that you were given.

## 2019-12-24 NOTE — Consult Note (Signed)
Anesthesiology note:  Debra Richards is a 86 year old female with severe low back pain and spinal stenosis.  She is scheduled to undergo right L4-5 and L5-S1 microdiscectomy by Dr. Vertell Limber on August 3.    She has a history of left bundle branch block, palpitations, rheumatoid arthritis, and mitral valve prolapse.  She also has complained of chronic dyspnea. She has been followed by the [facility] and is felt to be an intermediate risk for surgery due to her dyspnea, but that cardiac catheterization was not warranted.   She was seen in the preoperative clinic with her daughter and they both expressed concern that the patient had previously had surgery and was very confused afterwards.  I explained to her that since  she cannot tolerate her back pain and is willing to accept the risk of surgery that there is a chance she could develop confusion postoperatively as well as other complications.   However we will use the most short acting anesthetic regimen and try to limit her postoperative narcotic medication.  We also explained that general anesthesia is obligatory for this surgery and that spinal anesthesia is not an option.  The patient and her daughter understood these concerns and their questions were answered to their satisfaction.  Roberts Gaudy, MD

## 2019-12-24 NOTE — Anesthesia Preprocedure Evaluation (Addendum)
Anesthesia Evaluation  Patient identified by MRN, date of birth, ID band Patient awake    Reviewed: Allergy & Precautions, NPO status , Patient's Chart, lab work & pertinent test results  Airway Mallampati: I  TM Distance: >3 FB Neck ROM: Full    Dental  (+) Teeth Intact, Dental Advisory Given   Pulmonary neg pulmonary ROS,    breath sounds clear to auscultation       Cardiovascular + CAD  + dysrhythmias Atrial Fibrillation + Valvular Problems/Murmurs MVP  Rhythm:Regular Rate:Normal  Nuclear stress test 04/02/19:  Left bundle branch block present throughout with rare PACs and PVCs.  Small, mild intensity, mid to basal inferoseptal defect that is partially reversible, suggestive of variable soft tissue attenuation versus mild ischemic territory. Increased TID ratio looks to be due to misregistration rather than actual dilatation.  This is a low risk study.  Nuclear stress EF: 73%.   Echo 04/08/19:  1. Left ventricular ejection fraction, by visual estimation, is 55 to  60%. The left ventricle has normal function. There is mildly increased  left ventricular hypertrophy.  2. Abnormal septal motion consistent with left bundle branch block.  3. Left ventricular diastolic parameters are consistent with Grade I  diastolic dysfunction (impaired relaxation).  4. Global right ventricle has normal systolic function.The right  ventricular size is normal. No increase in right ventricular wall  thickness.  5. Left atrial size was normal.  6. Right atrial size was normal.  7. Small pericardial effusion.  8. The pericardial effusion is anterior to the right ventricle and  surrounding the apex.  9. Mild aortic valve annular calcification.  10. The mitral valve is grossly normal. Trace mitral valve regurgitation.  11. The tricuspid valve is grossly normal. Tricuspid valve regurgitation  is trivial.  12. The aortic valve is  tricuspid. Aortic valve regurgitation is not  visualized.  13. The pulmonic valve was grossly normal. Pulmonic valve regurgitation is  trivial.  14. TR signal is inadequate for assessing pulmonary artery systolic  pressure.  15. The inferior vena cava is normal in size with greater than 50%  respiratory variability, suggesting right atrial pressure of 3 mmHg.  - In comparison to the previous echocardiogram(s): No significant increase  in size of pericardial effusion compared with the prior study in October  2019.    Neuro/Psych PSYCHIATRIC DISORDERS Anxiety negative neurological ROS     GI/Hepatic Neg liver ROS, GERD  ,  Endo/Other  Hypothyroidism   Renal/GU Renal disease     Musculoskeletal  (+) Arthritis , Rheumatoid disorders,    Abdominal Normal abdominal exam  (+)   Peds  Hematology negative hematology ROS (+)   Anesthesia Other Findings   Reproductive/Obstetrics                           Anesthesia Physical Anesthesia Plan  ASA: III  Anesthesia Plan: General   Post-op Pain Management:    Induction: Intravenous  PONV Risk Score and Plan: 4 or greater and Ondansetron, Propofol infusion and TIVA  Airway Management Planned: Oral ETT  Additional Equipment: None  Intra-op Plan:   Post-operative Plan: Extubation in OR  Informed Consent: I have reviewed the patients History and Physical, chart, labs and discussed the procedure including the risks, benefits and alternatives for the proposed anesthesia with the patient or authorized representative who has indicated his/her understanding and acceptance.     Dental advisory given  Plan Discussed with: CRNA  Anesthesia  Plan Comments: (See note by Roberts Gaudy, MD.  Cardiology input outlined by Coletta Memos, NP:  "Given past medical history and time since last visit, based on ACC/AHA guidelines, Debra Richards would be at acceptable risk for the planned procedure without further  cardiovascular testing.   Case discussed with Dr. Domenic Polite.  Patient believed to be intermediate risk due to her current dyspnea on exertion and fatigue.  She wished to defer ischemic evaluation.  Her RCRI is a class III risk, 6.6% risk of major cardiac event.")      Anesthesia Quick Evaluation

## 2019-12-27 DIAGNOSIS — M47816 Spondylosis without myelopathy or radiculopathy, lumbar region: Secondary | ICD-10-CM | POA: Diagnosis not present

## 2019-12-27 DIAGNOSIS — M5136 Other intervertebral disc degeneration, lumbar region: Secondary | ICD-10-CM | POA: Diagnosis not present

## 2019-12-27 DIAGNOSIS — M5416 Radiculopathy, lumbar region: Secondary | ICD-10-CM | POA: Diagnosis not present

## 2019-12-27 DIAGNOSIS — M48062 Spinal stenosis, lumbar region with neurogenic claudication: Secondary | ICD-10-CM | POA: Diagnosis not present

## 2019-12-27 DIAGNOSIS — M4319 Spondylolisthesis, multiple sites in spine: Secondary | ICD-10-CM | POA: Diagnosis not present

## 2019-12-27 DIAGNOSIS — M5126 Other intervertebral disc displacement, lumbar region: Secondary | ICD-10-CM | POA: Diagnosis not present

## 2019-12-28 ENCOUNTER — Encounter (HOSPITAL_COMMUNITY): Payer: Self-pay | Admitting: Neurosurgery

## 2019-12-28 ENCOUNTER — Ambulatory Visit (HOSPITAL_COMMUNITY): Payer: Medicare Other

## 2019-12-28 ENCOUNTER — Other Ambulatory Visit: Payer: Self-pay

## 2019-12-28 ENCOUNTER — Ambulatory Visit (HOSPITAL_COMMUNITY): Payer: Medicare Other | Admitting: Anesthesiology

## 2019-12-28 ENCOUNTER — Encounter (HOSPITAL_COMMUNITY)
Admission: RE | Disposition: A | Discharge status: Home / Self Care | Payer: Self-pay | Source: Ambulatory Visit | Attending: Neurosurgery

## 2019-12-28 ENCOUNTER — Ambulatory Visit (HOSPITAL_COMMUNITY): Payer: Medicare Other | Admitting: Vascular Surgery

## 2019-12-28 ENCOUNTER — Observation Stay (HOSPITAL_COMMUNITY)
Admission: RE | Admit: 2019-12-28 | Discharge: 2019-12-29 | Disposition: A | Discharge status: Home / Self Care | Payer: Medicare Other | Source: Ambulatory Visit | Attending: Neurosurgery | Admitting: Neurosurgery

## 2019-12-28 DIAGNOSIS — Z981 Arthrodesis status: Secondary | ICD-10-CM | POA: Diagnosis not present

## 2019-12-28 DIAGNOSIS — M5136 Other intervertebral disc degeneration, lumbar region: Secondary | ICD-10-CM | POA: Diagnosis not present

## 2019-12-28 DIAGNOSIS — M48062 Spinal stenosis, lumbar region with neurogenic claudication: Secondary | ICD-10-CM | POA: Insufficient documentation

## 2019-12-28 DIAGNOSIS — M5416 Radiculopathy, lumbar region: Secondary | ICD-10-CM | POA: Diagnosis not present

## 2019-12-28 DIAGNOSIS — M48061 Spinal stenosis, lumbar region without neurogenic claudication: Secondary | ICD-10-CM | POA: Diagnosis not present

## 2019-12-28 DIAGNOSIS — M4316 Spondylolisthesis, lumbar region: Secondary | ICD-10-CM | POA: Diagnosis not present

## 2019-12-28 DIAGNOSIS — I959 Hypotension, unspecified: Secondary | ICD-10-CM | POA: Diagnosis not present

## 2019-12-28 DIAGNOSIS — M47816 Spondylosis without myelopathy or radiculopathy, lumbar region: Secondary | ICD-10-CM | POA: Diagnosis not present

## 2019-12-28 DIAGNOSIS — Z7982 Long term (current) use of aspirin: Secondary | ICD-10-CM | POA: Insufficient documentation

## 2019-12-28 DIAGNOSIS — M5126 Other intervertebral disc displacement, lumbar region: Secondary | ICD-10-CM | POA: Diagnosis not present

## 2019-12-28 DIAGNOSIS — I2510 Atherosclerotic heart disease of native coronary artery without angina pectoris: Secondary | ICD-10-CM | POA: Diagnosis not present

## 2019-12-28 DIAGNOSIS — M5117 Intervertebral disc disorders with radiculopathy, lumbosacral region: Secondary | ICD-10-CM | POA: Diagnosis not present

## 2019-12-28 DIAGNOSIS — M5116 Intervertebral disc disorders with radiculopathy, lumbar region: Secondary | ICD-10-CM | POA: Diagnosis not present

## 2019-12-28 DIAGNOSIS — R030 Elevated blood-pressure reading, without diagnosis of hypertension: Secondary | ICD-10-CM | POA: Diagnosis not present

## 2019-12-28 DIAGNOSIS — Z419 Encounter for procedure for purposes other than remedying health state, unspecified: Secondary | ICD-10-CM

## 2019-12-28 DIAGNOSIS — M545 Low back pain: Secondary | ICD-10-CM | POA: Diagnosis present

## 2019-12-28 DIAGNOSIS — I4891 Unspecified atrial fibrillation: Secondary | ICD-10-CM | POA: Diagnosis not present

## 2019-12-28 HISTORY — PX: LUMBAR LAMINECTOMY/DECOMPRESSION MICRODISCECTOMY: SHX5026

## 2019-12-28 SURGERY — LUMBAR LAMINECTOMY/DECOMPRESSION MICRODISCECTOMY 2 LEVELS
Anesthesia: General | Site: Spine Lumbar | Laterality: Right

## 2019-12-28 MED ORDER — ORAL CARE MOUTH RINSE: 15.0000 mL | Freq: Once | OROMUCOSAL | Status: AC

## 2019-12-28 MED ORDER — SODIUM CHLORIDE 0.9% FLUSH
3.0000 mL | Freq: Two times a day (BID) | INTRAVENOUS | Status: DC
  Administered 2019-12-28: 3 mL via INTRAVENOUS

## 2019-12-28 MED ORDER — ASPIRIN EC 81 MG PO TBEC
81.0000 mg | DELAYED_RELEASE_TABLET | Freq: Every day | ORAL | Status: DC
  Administered 2019-12-29: 81 mg via ORAL
  Filled 2019-12-28 (×2): qty 1

## 2019-12-28 MED ORDER — FENTANYL CITRATE (PF) 100 MCG/2ML IJ SOLN
INTRAMUSCULAR | Status: DC | PRN
  Administered 2019-12-28 (×2): 50 ug via INTRAVENOUS

## 2019-12-28 MED ORDER — METHYLPREDNISOLONE ACETATE 80 MG/ML IJ SUSP
INTRAMUSCULAR | Status: AC
  Filled 2019-12-28: qty 1

## 2019-12-28 MED ORDER — METHOCARBAMOL 1000 MG/10ML IJ SOLN
500.0000 mg | Freq: Four times a day (QID) | INTRAVENOUS | Status: DC | PRN
  Filled 2019-12-28: qty 5

## 2019-12-28 MED ORDER — TRAMADOL HCL 50 MG PO TABS
25.0000 mg | ORAL_TABLET | Freq: Four times a day (QID) | ORAL | Status: DC | PRN
  Administered 2019-12-29: 25 mg via ORAL
  Filled 2019-12-28 (×2): qty 1

## 2019-12-28 MED ORDER — DEXAMETHASONE SODIUM PHOSPHATE 10 MG/ML IJ SOLN
INTRAMUSCULAR | Status: AC
  Filled 2019-12-28: qty 1

## 2019-12-28 MED ORDER — KETOTIFEN FUMARATE 0.025 % OP SOLN
1.0000 [drp] | Freq: Two times a day (BID) | OPHTHALMIC | Status: DC | PRN
  Filled 2019-12-28: qty 5

## 2019-12-28 MED ORDER — POLYETHYLENE GLYCOL 3350 17 G PO PACK: 17.0000 g | PACK | Freq: Every day | ORAL | Status: DC | PRN

## 2019-12-28 MED ORDER — LACTATED RINGERS IV SOLN: INTRAVENOUS | Status: DC

## 2019-12-28 MED ORDER — VANCOMYCIN HCL IN DEXTROSE 1-5 GM/200ML-% IV SOLN
INTRAVENOUS | Status: AC
  Administered 2019-12-28: 1000 mg via INTRAVENOUS
  Filled 2019-12-28: qty 200

## 2019-12-28 MED ORDER — LIDOCAINE 2% (20 MG/ML) 5 ML SYRINGE
INTRAMUSCULAR | Status: AC
  Filled 2019-12-28: qty 5

## 2019-12-28 MED ORDER — PHENOL 1.4 % MT LIQD: 1.0000 | OROMUCOSAL | Status: DC | PRN

## 2019-12-28 MED ORDER — KETOROLAC TROMETHAMINE 30 MG/ML IJ SOLN
INTRAMUSCULAR | Status: AC
  Filled 2019-12-28: qty 1

## 2019-12-28 MED ORDER — ALUM & MAG HYDROXIDE-SIMETH 200-200-20 MG/5ML PO SUSP: 30.0000 mL | Freq: Four times a day (QID) | ORAL | Status: DC | PRN

## 2019-12-28 MED ORDER — ZOLPIDEM TARTRATE 5 MG PO TABS: 5.0000 mg | ORAL_TABLET | Freq: Every evening | ORAL | Status: DC | PRN

## 2019-12-28 MED ORDER — HYDROMORPHONE HCL 1 MG/ML IJ SOLN: 0.5000 mg | INTRAMUSCULAR | Status: DC | PRN

## 2019-12-28 MED ORDER — BUPIVACAINE HCL (PF) 0.5 % IJ SOLN
INTRAMUSCULAR | Status: DC | PRN
  Administered 2019-12-28: 5 mL

## 2019-12-28 MED ORDER — LEVOTHYROXINE SODIUM 75 MCG PO TABS
75.0000 ug | ORAL_TABLET | Freq: Every day | ORAL | Status: DC
  Administered 2019-12-29: 75 ug via ORAL
  Filled 2019-12-28: qty 1

## 2019-12-28 MED ORDER — ZINC 50 MG PO TABS: 50.0000 mg | ORAL_TABLET | Freq: Every day | ORAL | Status: DC

## 2019-12-28 MED ORDER — FENTANYL CITRATE (PF) 100 MCG/2ML IJ SOLN
INTRAMUSCULAR | Status: AC
  Administered 2019-12-28: 25 ug
  Filled 2019-12-28: qty 2

## 2019-12-28 MED ORDER — LIDOCAINE-EPINEPHRINE 1 %-1:100000 IJ SOLN
INTRAMUSCULAR | Status: DC | PRN
  Administered 2019-12-28: 5 mL

## 2019-12-28 MED ORDER — VITAMIN D 25 MCG (1000 UNIT) PO TABS
1000.0000 [IU] | ORAL_TABLET | Freq: Every day | ORAL | Status: DC
  Administered 2019-12-29: 1000 [IU] via ORAL
  Filled 2019-12-28: qty 1

## 2019-12-28 MED ORDER — DOCUSATE SODIUM 100 MG PO CAPS
100.0000 mg | ORAL_CAPSULE | Freq: Two times a day (BID) | ORAL | Status: DC
  Administered 2019-12-29: 100 mg via ORAL
  Filled 2019-12-28 (×2): qty 1

## 2019-12-28 MED ORDER — ONDANSETRON HCL 4 MG/2ML IJ SOLN
INTRAMUSCULAR | Status: AC
  Filled 2019-12-28: qty 2

## 2019-12-28 MED ORDER — KETOROLAC TROMETHAMINE 30 MG/ML IJ SOLN
INTRAMUSCULAR | Status: DC | PRN
  Administered 2019-12-28: 15 mg via INTRAVENOUS

## 2019-12-28 MED ORDER — PANTOPRAZOLE SODIUM 40 MG PO TBEC
40.0000 mg | DELAYED_RELEASE_TABLET | Freq: Every day | ORAL | Status: DC
  Filled 2019-12-28: qty 1

## 2019-12-28 MED ORDER — CHLORHEXIDINE GLUCONATE CLOTH 2 % EX PADS: 6.0000 | MEDICATED_PAD | Freq: Once | CUTANEOUS | Status: DC

## 2019-12-28 MED ORDER — FENTANYL CITRATE (PF) 100 MCG/2ML IJ SOLN
25.0000 ug | INTRAMUSCULAR | Status: DC | PRN
  Administered 2019-12-28 (×3): 25 ug via INTRAVENOUS

## 2019-12-28 MED ORDER — FENTANYL CITRATE (PF) 100 MCG/2ML IJ SOLN
INTRAMUSCULAR | Status: AC
  Filled 2019-12-28: qty 2

## 2019-12-28 MED ORDER — ONDANSETRON HCL 4 MG PO TABS: 4.0000 mg | ORAL_TABLET | Freq: Four times a day (QID) | ORAL | Status: DC | PRN

## 2019-12-28 MED ORDER — LIDOCAINE 2% (20 MG/ML) 5 ML SYRINGE
INTRAMUSCULAR | Status: DC | PRN
  Administered 2019-12-28: 40 mg via INTRAVENOUS

## 2019-12-28 MED ORDER — METHOCARBAMOL 500 MG PO TABS: 500.0000 mg | ORAL_TABLET | Freq: Four times a day (QID) | ORAL | Status: DC | PRN

## 2019-12-28 MED ORDER — ROCURONIUM BROMIDE 10 MG/ML (PF) SYRINGE
PREFILLED_SYRINGE | INTRAVENOUS | Status: DC | PRN
  Administered 2019-12-28: 50 mg via INTRAVENOUS

## 2019-12-28 MED ORDER — SODIUM CHLORIDE 0.9% FLUSH: 3.0000 mL | INTRAVENOUS | Status: DC | PRN

## 2019-12-28 MED ORDER — PROPOFOL 10 MG/ML IV BOLUS
INTRAVENOUS | Status: AC
  Filled 2019-12-28: qty 20

## 2019-12-28 MED ORDER — LIDOCAINE-EPINEPHRINE 1 %-1:100000 IJ SOLN
INTRAMUSCULAR | Status: AC
  Filled 2019-12-28: qty 1

## 2019-12-28 MED ORDER — SODIUM CHLORIDE 0.9 % IV SOLN: 250.0000 mL | INTRAVENOUS | Status: DC

## 2019-12-28 MED ORDER — PROPOFOL 10 MG/ML IV BOLUS
INTRAVENOUS | Status: DC | PRN
  Administered 2019-12-28: 70 mg via INTRAVENOUS

## 2019-12-28 MED ORDER — VANCOMYCIN HCL IN DEXTROSE 1-5 GM/200ML-% IV SOLN: 1000.0000 mg | INTRAVENOUS | Status: AC

## 2019-12-28 MED ORDER — ACETAMINOPHEN 500 MG PO TABS: 500.0000 mg | ORAL_TABLET | Freq: Four times a day (QID) | ORAL | Status: DC | PRN

## 2019-12-28 MED ORDER — VITAMIN E 180 MG (400 UNIT) PO CAPS
400.0000 [IU] | ORAL_CAPSULE | Freq: Every day | ORAL | Status: DC
  Administered 2019-12-29: 400 [IU] via ORAL
  Filled 2019-12-28 (×2): qty 1

## 2019-12-28 MED ORDER — ONDANSETRON HCL 4 MG/2ML IJ SOLN
4.0000 mg | Freq: Four times a day (QID) | INTRAMUSCULAR | Status: DC | PRN
  Administered 2019-12-28: 4 mg via INTRAVENOUS
  Filled 2019-12-28: qty 2

## 2019-12-28 MED ORDER — ACETAMINOPHEN 10 MG/ML IV SOLN
INTRAVENOUS | Status: AC
  Filled 2019-12-28: qty 100

## 2019-12-28 MED ORDER — BISACODYL 10 MG RE SUPP: 10.0000 mg | Freq: Every day | RECTAL | Status: DC | PRN

## 2019-12-28 MED ORDER — CHLORHEXIDINE GLUCONATE 0.12 % MT SOLN: 15.0000 mL | Freq: Once | OROMUCOSAL | Status: AC

## 2019-12-28 MED ORDER — 0.9 % SODIUM CHLORIDE (POUR BTL) OPTIME
TOPICAL | Status: DC | PRN
  Administered 2019-12-28: 1000 mL

## 2019-12-28 MED ORDER — FENTANYL CITRATE (PF) 100 MCG/2ML IJ SOLN: 25.0000 ug | INTRAMUSCULAR | Status: DC | PRN

## 2019-12-28 MED ORDER — ACETAMINOPHEN 650 MG RE SUPP: 650.0000 mg | RECTAL | Status: DC | PRN

## 2019-12-28 MED ORDER — THROMBIN 5000 UNITS EX SOLR
OROMUCOSAL | Status: DC | PRN
  Administered 2019-12-28: 5 mL

## 2019-12-28 MED ORDER — BUPIVACAINE HCL (PF) 0.5 % IJ SOLN
INTRAMUSCULAR | Status: AC
  Filled 2019-12-28: qty 30

## 2019-12-28 MED ORDER — ONDANSETRON HCL 4 MG/2ML IJ SOLN
INTRAMUSCULAR | Status: DC | PRN
  Administered 2019-12-28: 4 mg via INTRAVENOUS

## 2019-12-28 MED ORDER — PANTOPRAZOLE SODIUM 40 MG IV SOLR: 40.0000 mg | Freq: Every day | INTRAVENOUS | Status: DC

## 2019-12-28 MED ORDER — ACETAMINOPHEN 325 MG PO TABS: 650.0000 mg | ORAL_TABLET | ORAL | Status: DC | PRN

## 2019-12-28 MED ORDER — MENTHOL 3 MG MT LOZG: 1.0000 | LOZENGE | OROMUCOSAL | Status: DC | PRN

## 2019-12-28 MED ORDER — PROPOFOL 500 MG/50ML IV EMUL
INTRAVENOUS | Status: DC | PRN
Start: 2019-12-28 — End: 2019-12-28
  Administered 2019-12-28: 25 ug/kg/min via INTRAVENOUS

## 2019-12-28 MED ORDER — DEXAMETHASONE SODIUM PHOSPHATE 10 MG/ML IJ SOLN
INTRAMUSCULAR | Status: DC | PRN
  Administered 2019-12-28: 10 mg via INTRAVENOUS

## 2019-12-28 MED ORDER — PANTOPRAZOLE SODIUM 40 MG PO TBEC: 40.0000 mg | DELAYED_RELEASE_TABLET | Freq: Every day | ORAL | Status: DC

## 2019-12-28 MED ORDER — ISOSORBIDE MONONITRATE ER 30 MG PO TB24
15.0000 mg | ORAL_TABLET | Freq: Every day | ORAL | Status: DC
  Filled 2019-12-28: qty 1

## 2019-12-28 MED ORDER — KCL IN DEXTROSE-NACL 20-5-0.45 MEQ/L-%-% IV SOLN: INTRAVENOUS | Status: DC

## 2019-12-28 MED ORDER — THROMBIN 5000 UNITS EX SOLR
CUTANEOUS | Status: AC
  Filled 2019-12-28: qty 5000

## 2019-12-28 MED ORDER — ROCURONIUM BROMIDE 10 MG/ML (PF) SYRINGE
PREFILLED_SYRINGE | INTRAVENOUS | Status: AC
  Filled 2019-12-28: qty 10

## 2019-12-28 MED ORDER — SUGAMMADEX SODIUM 200 MG/2ML IV SOLN
INTRAVENOUS | Status: DC | PRN
Start: 2019-12-28 — End: 2019-12-28
  Administered 2019-12-28: 100 mg via INTRAVENOUS

## 2019-12-28 MED ORDER — CHLORHEXIDINE GLUCONATE 0.12 % MT SOLN
OROMUCOSAL | Status: AC
  Administered 2019-12-28: 15 mL via OROMUCOSAL
  Filled 2019-12-28: qty 15

## 2019-12-28 MED ORDER — FENTANYL CITRATE (PF) 250 MCG/5ML IJ SOLN
INTRAMUSCULAR | Status: AC
  Filled 2019-12-28: qty 5

## 2019-12-28 MED ORDER — HYDROXYZINE HCL 50 MG/ML IM SOLN
50.0000 mg | Freq: Four times a day (QID) | INTRAMUSCULAR | Status: DC | PRN
  Administered 2019-12-28: 50 mg via INTRAMUSCULAR
  Filled 2019-12-28: qty 1

## 2019-12-28 MED ORDER — HYDROCODONE-ACETAMINOPHEN 5-325 MG PO TABS: 1.0000 | ORAL_TABLET | ORAL | Status: DC | PRN

## 2019-12-28 MED ORDER — OXYCODONE HCL 5 MG PO TABS: 5.0000 mg | ORAL_TABLET | ORAL | Status: DC | PRN

## 2019-12-28 MED ORDER — ACETAMINOPHEN 10 MG/ML IV SOLN
INTRAVENOUS | Status: DC | PRN
  Administered 2019-12-28: 1000 mg via INTRAVENOUS

## 2019-12-28 MED ORDER — FLEET ENEMA 7-19 GM/118ML RE ENEM: 1.0000 | ENEMA | Freq: Once | RECTAL | Status: DC | PRN

## 2019-12-28 SURGICAL SUPPLY — 61 items
ADH SKN CLS APL DERMABOND .7 (GAUZE/BANDAGES/DRESSINGS) ×1
APL SKNCLS STERI-STRIP NONHPOA (GAUZE/BANDAGES/DRESSINGS) ×1
BAND INSRT 18 STRL LF DISP RB (MISCELLANEOUS) ×2
BAND RUBBER #18 3X1/16 STRL (MISCELLANEOUS) ×6 IMPLANT
BENZOIN TINCTURE PRP APPL 2/3 (GAUZE/BANDAGES/DRESSINGS) ×3 IMPLANT
BLADE CLIPPER SURG (BLADE) IMPLANT
BUR MATCHSTICK NEURO 3.0 LAGG (BURR) ×3 IMPLANT
BUR ROUND FLUTED 5 RND (BURR) ×2 IMPLANT
BUR ROUND FLUTED 5MM RND (BURR) ×1
CANISTER SUCT 3000ML PPV (MISCELLANEOUS) ×3 IMPLANT
CARTRIDGE OIL MAESTRO DRILL (MISCELLANEOUS) ×1 IMPLANT
CLOSURE WOUND 1/2 X4 (GAUZE/BANDAGES/DRESSINGS) ×1
COVER WAND RF STERILE (DRAPES) ×3 IMPLANT
DECANTER SPIKE VIAL GLASS SM (MISCELLANEOUS) ×3 IMPLANT
DERMABOND ADVANCED (GAUZE/BANDAGES/DRESSINGS) ×2
DERMABOND ADVANCED .7 DNX12 (GAUZE/BANDAGES/DRESSINGS) ×1 IMPLANT
DIFFUSER DRILL AIR PNEUMATIC (MISCELLANEOUS) ×3 IMPLANT
DRAPE LAPAROTOMY 100X72X124 (DRAPES) ×3 IMPLANT
DRAPE MICROSCOPE LEICA (MISCELLANEOUS) ×3 IMPLANT
DRAPE SURG 17X23 STRL (DRAPES) ×3 IMPLANT
DRSG OPSITE POSTOP 4X6 (GAUZE/BANDAGES/DRESSINGS) ×2 IMPLANT
DURAPREP 26ML APPLICATOR (WOUND CARE) ×3 IMPLANT
ELECT REM PT RETURN 9FT ADLT (ELECTROSURGICAL) ×3
ELECTRODE REM PT RTRN 9FT ADLT (ELECTROSURGICAL) ×1 IMPLANT
GAUZE 4X4 16PLY RFD (DISPOSABLE) IMPLANT
GAUZE SPONGE 4X4 12PLY STRL (GAUZE/BANDAGES/DRESSINGS) IMPLANT
GLOVE BIO SURGEON STRL SZ8 (GLOVE) ×3 IMPLANT
GLOVE BIOGEL PI IND STRL 8 (GLOVE) ×1 IMPLANT
GLOVE BIOGEL PI IND STRL 8.5 (GLOVE) ×1 IMPLANT
GLOVE BIOGEL PI INDICATOR 8 (GLOVE) ×2
GLOVE BIOGEL PI INDICATOR 8.5 (GLOVE) ×2
GLOVE ECLIPSE 8.0 STRL XLNG CF (GLOVE) ×3 IMPLANT
GLOVE EXAM NITRILE XL STR (GLOVE) IMPLANT
GOWN STRL REUS W/ TWL LRG LVL3 (GOWN DISPOSABLE) IMPLANT
GOWN STRL REUS W/ TWL XL LVL3 (GOWN DISPOSABLE) ×1 IMPLANT
GOWN STRL REUS W/TWL 2XL LVL3 (GOWN DISPOSABLE) ×3 IMPLANT
GOWN STRL REUS W/TWL LRG LVL3 (GOWN DISPOSABLE)
GOWN STRL REUS W/TWL XL LVL3 (GOWN DISPOSABLE) ×3
HEMOSTAT POWDER KIT SURGIFOAM (HEMOSTASIS) ×3 IMPLANT
KIT BASIN OR (CUSTOM PROCEDURE TRAY) ×3 IMPLANT
KIT TURNOVER KIT B (KITS) ×3 IMPLANT
NDL HYPO 18GX1.5 BLUNT FILL (NEEDLE) IMPLANT
NDL HYPO 25X1 1.5 SAFETY (NEEDLE) ×1 IMPLANT
NEEDLE HYPO 18GX1.5 BLUNT FILL (NEEDLE) IMPLANT
NEEDLE HYPO 25X1 1.5 SAFETY (NEEDLE) ×3 IMPLANT
NEEDLE SPNL 18GX3.5 QUINCKE PK (NEEDLE) ×3 IMPLANT
NS IRRIG 1000ML POUR BTL (IV SOLUTION) ×3 IMPLANT
OIL CARTRIDGE MAESTRO DRILL (MISCELLANEOUS) ×3
PACK LAMINECTOMY NEURO (CUSTOM PROCEDURE TRAY) ×3 IMPLANT
PAD ARMBOARD 7.5X6 YLW CONV (MISCELLANEOUS) ×9 IMPLANT
SPONGE SURGIFOAM ABS GEL SZ50 (HEMOSTASIS) IMPLANT
STAPLER SKIN PROX WIDE 3.9 (STAPLE) IMPLANT
STRIP CLOSURE SKIN 1/2X4 (GAUZE/BANDAGES/DRESSINGS) ×2 IMPLANT
SUT VIC AB 0 CT1 18XCR BRD8 (SUTURE) ×1 IMPLANT
SUT VIC AB 0 CT1 8-18 (SUTURE) ×3
SUT VIC AB 2-0 CT1 18 (SUTURE) ×3 IMPLANT
SUT VIC AB 3-0 SH 8-18 (SUTURE) ×3 IMPLANT
SYR 5ML LL (SYRINGE) IMPLANT
TOWEL GREEN STERILE (TOWEL DISPOSABLE) ×3 IMPLANT
TOWEL GREEN STERILE FF (TOWEL DISPOSABLE) ×3 IMPLANT
WATER STERILE IRR 1000ML POUR (IV SOLUTION) ×3 IMPLANT

## 2019-12-28 NOTE — Op Note (Signed)
12/28/2019  4:00 PM  PATIENT:  SALLYE LUNZ  86 y.o. female  PRE-OPERATIVE DIAGNOSIS:  Herniated nucleus pulposus, Lumbar L 45 right and L 5 S 1 right with radiculopathy, stenosis, lumbago, spondylolisthesis  POST-OPERATIVE DIAGNOSIS: Herniated nucleus pulposus, Lumbar L 45 right and L 5 S 1 right with radiculopathy, stenosis, lumbago, spondylolisthesis  PROCEDURE:  Procedure(s) with comments: Right Lumbar Four-Lumbar Five Microdiscectomy (Right) - Right Lumbar Four-Lumbar Five Microdiscectomy  SURGEON:  Surgeon(s) and Role:    Erline Levine, MD - Primary  PHYSICIAN ASSISTANT: Glenford Peers, NP  ASSISTANTS: Poteat, RN   Jacklynn Bue, MS 3   ANESTHESIA:   general  EBL:  30 mL   BLOOD ADMINISTERED:none  DRAINS: none   LOCAL MEDICATIONS USED:  MARCAINE    and LIDOCAINE   SPECIMEN:  No Specimen  DISPOSITION OF SPECIMEN:  N/A  COUNTS:  YES  TOURNIQUET:  * No tourniquets in log *  DICTATION: Patient is a 86 year old woman with intractable right leg pain with a disc rupture on the right at L 45 and L 5 S 1levels  with significant right leg weakness. It was elected to take her to surgery for right L 45 and L 5 S 1 microdiscectomy.  Procedure: Patient was brought to the operating room and following the smooth and uncomplicated induction of general endotracheal anesthesia she was placed in a prone position on the Wilson frame. Low back was prepped and draped in the usual sterile fashion with betadine scrub and DuraPrep. Preoperative localizing X ray was obtained with a spinal needle.  Area of planned incision was infiltrated with local lidocaine. Incision was made in the midline and carried to the lumbodorsal fascia which was incised on the right side of midline. Subperiosteal dissection was performed exposing what was felt to be L 45 level. Intraoperative x-ray demonstrated marker probe at L 45.  A hemi-semi-laminectomy of L 4 was performed a high-speed drill and completed with  Kerrison rongeurs and a generous foraminotomy was performed overlying the superior aspect of the L 5 lamina. Ligamentum flavum was detached and removed in a piecemeal fashion and the L 5 nerve root was decompressed laterally with removal of the superior aspect of the facet and ligamentum causing nerve root compression. The microscope was brought into the field and the thecal sac and  L 5 nerve root was mobilized medially. This exposed a large amount of soft disc material an inferiorly migrated free fragment of herniated disc material. The L 4 nerve root was conjoined and exited the foramen at the level of the disc space.  Multiple fragments were removed and the nerve roots were decompressed, but I was unable to mobilize the L 4 nerve root and removed as much disc material as I could safely below the nerve root.  At this point it was felt that all neural elements were well decompressed and  I did not feel that it was necessary to expose the L 5 S 1 level, given the severely compressed conjoined nerve root identified at the L 45 level.  The patient's bone was extremely soft and I was concerned that a laminectomy might destabilize the  additional level.  The lumbodorsal fascia was closed with 0 Vicryl sutures the subcutaneous tissues reapproximated 2-0 Vicryl inverted sutures and the skin edges were reapproximated with 3-0 Vicryl subcuticular stitch. The wound is dressed with Dermabond and an occlusive dressing. Patient was extubated in the operating room and taken to recovery in stable and satisfactory condition  having tolerated his operation well counts were correct at the end of the case.   PLAN OF CARE: Admit to inpatient   PATIENT DISPOSITION:  PACU - hemodynamically stable.   Delay start of Pharmacological VTE agent (>24hrs) due to surgical blood loss or risk of bleeding: yes

## 2019-12-28 NOTE — Interval H&P Note (Signed)
History and Physical Interval Note:  12/28/2019 2:12 PM  Debra Richards  has presented today for surgery, with the diagnosis of Herniated nucleus pulposus, Lumbar.  The various methods of treatment have been discussed with the patient and family. After consideration of risks, benefits and other options for treatment, the patient has consented to  Procedure(s) with comments: Right Lumbar 4-5 Lumbar 5 Sacral 1 Microdiscectomy (Right) - 3C/RM 21 as a surgical intervention.  The patient's history has been reviewed, patient examined, no change in status, stable for surgery.  I have reviewed the patient's chart and labs.  Questions were answered to the patient's satisfaction.     [name]

## 2019-12-28 NOTE — Anesthesia Procedure Notes (Signed)
Procedure Name: Intubation Date/Time: 12/28/2019 2:22 PM Performed by: Moshe Salisbury, CRNA Pre-anesthesia Checklist: Patient identified, Emergency Drugs available, Suction available and Patient being monitored Patient Re-evaluated:Patient Re-evaluated prior to induction Oxygen Delivery Method: Circle System Utilized Preoxygenation: Pre-oxygenation with 100% oxygen Induction Type: IV induction Ventilation: Mask ventilation without difficulty Laryngoscope Size: Mac and 3 Grade View: Grade I Tube type: Oral Tube size: 7.5 mm Number of attempts: 1 Airway Equipment and Method: Stylet Placement Confirmation: ETT inserted through vocal cords under direct vision,  positive ETCO2 and breath sounds checked- equal and bilateral Secured at: 19 cm Tube secured with: Tape Dental Injury: Teeth and Oropharynx as per pre-operative assessment

## 2019-12-28 NOTE — Anesthesia Postprocedure Evaluation (Signed)
Anesthesia Post Note  Patient: Debra Richards  Procedure(s) Performed: Right Lumbar Four-Lumbar Five Microdiscectomy (Right Spine Lumbar)     Patient location during evaluation: PACU Anesthesia Type: General Level of consciousness: awake and alert Pain management: pain level controlled Vital Signs Assessment: post-procedure vital signs reviewed and stable Respiratory status: spontaneous breathing, nonlabored ventilation, respiratory function stable and patient connected to nasal cannula oxygen Cardiovascular status: blood pressure returned to baseline and stable Postop Assessment: no apparent nausea or vomiting Anesthetic complications: no   No complications documented.  Last Vitals:  Vitals:   12/28/19 1630 12/28/19 1645  BP: (!) 124/57 118/63  Pulse: (!) 57 61  Resp: 12 15  Temp:  (!) 36.1 C  SpO2: 100% 99%    Last Pain:  Vitals:   12/28/19 1645  TempSrc:   PainSc: [place] [name]

## 2019-12-28 NOTE — Transfer of Care (Signed)
Immediate Anesthesia Transfer of Care Note  Patient: Debra Richards  Procedure(s) Performed: Right Lumbar Four-Lumbar Five Microdiscectomy (Right Spine Lumbar)  Patient Location: PACU  Anesthesia Type:General  Level of Consciousness: drowsy and patient cooperative  Airway & Oxygen Therapy: Patient Spontanous Breathing and Patient connected to nasal cannula oxygen  Post-op Assessment: Report given to RN, Post -op Vital signs reviewed and stable and Patient moving all extremities  Post vital signs: Reviewed and stable  Last Vitals:  Vitals Value Taken Time  BP    Temp    Pulse    Resp    SpO2      Last Pain:  Vitals:   12/28/19 1545  TempSrc:   PainSc: (P) 0-No pain      Patients Stated Pain Goal: 3 (35/43/01 4840)  Complications: No complications documented.

## 2019-12-28 NOTE — Brief Op Note (Signed)
12/28/2019  4:00 PM  PATIENT:  Debra Richards  86 y.o. female  PRE-OPERATIVE DIAGNOSIS:  Herniated nucleus pulposus, Lumbar L 45 right and L 5 S 1 right with radiculopathy, stenosis, lumbago, spondylolisthesis  POST-OPERATIVE DIAGNOSIS: Herniated nucleus pulposus, Lumbar L 45 right and L 5 S 1 right with radiculopathy, stenosis, lumbago, spondylolisthesis  PROCEDURE:  Procedure(s) with comments: Right Lumbar Four-Lumbar Five Microdiscectomy (Right) - Right Lumbar Four-Lumbar Five Microdiscectomy  SURGEON:  Surgeon(s) and Role:    Erline Levine, MD - Primary  PHYSICIAN ASSISTANT: Glenford Peers, NP  ASSISTANTS: Poteat, RN   Jacklynn Bue, MS 3   ANESTHESIA:   general  EBL:  30 mL   BLOOD ADMINISTERED:none  DRAINS: none   LOCAL MEDICATIONS USED:  MARCAINE    and LIDOCAINE   SPECIMEN:  No Specimen  DISPOSITION OF SPECIMEN:  N/A  COUNTS:  YES  TOURNIQUET:  * No tourniquets in log *  DICTATION: Patient is a 86 year old woman with intractable right leg pain with a disc rupture on the right at L 45 and L 5 S 1levels  with significant right leg weakness. It was elected to take her to surgery for right L 45 and L 5 S 1 microdiscectomy.  Procedure: Patient was brought to the operating room and following the smooth and uncomplicated induction of general endotracheal anesthesia she was placed in a prone position on the Wilson frame. Low back was prepped and draped in the usual sterile fashion with betadine scrub and DuraPrep. Preoperative localizing X ray was obtained with a spinal needle.  Area of planned incision was infiltrated with local lidocaine. Incision was made in the midline and carried to the lumbodorsal fascia which was incised on the right side of midline. Subperiosteal dissection was performed exposing what was felt to be L 45 level. Intraoperative x-ray demonstrated marker probe at L 45.  A hemi-semi-laminectomy of L 4 was performed a high-speed drill and completed with  Kerrison rongeurs and a generous foraminotomy was performed overlying the superior aspect of the L 5 lamina. Ligamentum flavum was detached and removed in a piecemeal fashion and the L 5 nerve root was decompressed laterally with removal of the superior aspect of the facet and ligamentum causing nerve root compression. The microscope was brought into the field and the thecal sac and  L 5 nerve root was mobilized medially. This exposed a large amount of soft disc material an inferiorly migrated free fragment of herniated disc material. The L 4 nerve root was conjoined and exited the foramen at the level of the disc space.  Multiple fragments were removed and the nerve roots were decompressed, but I was unable to mobilize the L 4 nerve root and removed as much disc material as I could safely below the nerve root.  At this point it was felt that all neural elements were well decompressed and  I did not feel that it was necessary to expose the L 5 S 1 level, given the severely compressed conjoined nerve root identified at the L 45 level.  The patient's bone was extremely soft and I was concerned that a laminectomy might destabilize the  additional level.  The lumbodorsal fascia was closed with 0 Vicryl sutures the subcutaneous tissues reapproximated 2-0 Vicryl inverted sutures and the skin edges were reapproximated with 3-0 Vicryl subcuticular stitch. The wound is dressed with Dermabond and an occlusive dressing. Patient was extubated in the operating room and taken to recovery in stable and satisfactory condition  having tolerated his operation well counts were correct at the end of the case.   PLAN OF CARE: Admit to inpatient   PATIENT DISPOSITION:  PACU - hemodynamically stable.   Delay start of Pharmacological VTE agent (>24hrs) due to surgical blood loss or risk of bleeding: yes

## 2019-12-28 NOTE — H&P (Signed)
Patient ID:   [id number] Patient: Debra Richards  Date of Birth: 1925/10/04 Visit Type: Office Visit   Date: 12/06/2019 01:45 PM Provider: Marchia Meiers. Vertell Limber MD   This 86 year old female presents for back pain.  HISTORY OF PRESENT ILLNESS:  1.  back pain  [name] is a 86 year old female who returns for evaluation after obtaining a noncontrast MRI of her lumbar spine.  She continues to have right lumbar pain that travels into her right hip and right leg.  She states that when she initially wakes up the pain travels down her right leg into her heel, but after she ambulates for a short period of time the pain stops at her knee.  She stated that she can not sleep and can not walk and stated that "makes be hilar when I get out of bed.  "She stated that sitting makes her pain better for a short period of time.  She reported occasional numbness in her right leg that goes into her right foot.  Due to the pain in her right lower back and right lower extremity she has been ambulating with a cane for the last couple of weeks.  The patient stated she has constant pain and is uncomfortable all of the time.  She stated that her pain is generally a 10-15/10.  And a 1000/10 in the mornings.  Right L5-S1 TF ESI's by Dr. Sherwood Gambler:  Sept 2020 helpful; Dec ESI's offered no relief  Hx: GERD, hypothyroidism, irregular HR (since 1978) SxHx: RtTKR, RtTHR, cholecystectomy, difficulty with anesthesia  lumbar x-rays in January on [place]         Medical/Surgical/Interim History Reviewed, no change.  Last detailed document date:02/09/2019.     PAST MEDICAL HISTORY, SURGICAL HISTORY, FAMILY HISTORY, SOCIAL HISTORY AND REVIEW OF SYSTEMS I have reviewed the patient's past medical, surgical, family and social history as well as the comprehensive review of systems as included on the [facility] history form dated 02/09/2019, which I have signed.  Family History:  Reviewed, no  changes.  Last detailed document date:02/09/2019.   Social History: Reviewed, no changes. Last detailed document date: 02/09/2019.    MEDICATIONS: (added, continued or stopped this visit) Started Medication Directions Instruction Stopped   Aspir-81 mg tablet,delayed release take 1 tablet by oral route  every day     hydrocodone 5 mg-acetaminophen 325 mg tablet take 1 tablet by oral route  every 4-6 hours as needed for pain  12/06/2019   levothyroxine 75 mcg tablet take 1 tablet by oral route  every day     Prilosec OTC 20 mg tablet,delayed release take 1 tablet by mouth daily     Prolia 60 mg/mL subcutaneous syringe inject 1 milliliter by subcutaneous route  every 6 months in the upper arm, upper thigh or abdomen    11/19/2019 tramadol 50 mg tablet take 1 tablet by oral route  every 6 hours as needed     Vitamin D3 25 mcg (1,000 unit) tablet take 1 tablet by mouth daily     vitamin E 400 unit capsule take 1 tablet by mouth daily       ALLERGIES: Ingredient Reaction Medication Name Comment  LEVOFLOXACIN  Levaquin   PENICILLINS      Reviewed, no changes.    PHYSICAL EXAM:   Vitals Date Temp F BP Pulse Ht In Wt Lb BMI BSA Pain Score  12/06/2019  138/74 66 65 127.8 21.27  10/10    PHYSICAL EXAM Details  General Level of Distress: no acute distress Overall Appearance: normal    Cardiovascular Cardiac: regular rate and rhythm without murmur  Respiratory Lungs: clear to auscultation  Neurological Recent and Remote Memory: normal Attention Span and Concentration:   normal Language: normal Fund of Knowledge: normal  Right Left Sensation: normal normal Upper Extremity Coordination: normal normal  Lower Extremity Coordination: normal normal  Musculoskeletal Gait and Station: normal  Right Left Upper Extremity Muscle Strength: normal normal Lower Extremity Muscle Strength: normal normal Upper Extremity Muscle Tone:  normal normal Lower Extremity Muscle  Tone: normal normal   Motor Strength Upper and lower extremity motor strength was tested in the clinically pertinent muscles.     Deep Tendon Reflexes  Right Left Biceps: normal normal Triceps: normal normal Brachioradialis: normal normal Patellar: normal normal Achilles: normal normal  Sensory Sensation was tested at L1 to S1.   Cranial Nerves II. Optic Nerve/Visual Fields: normal III. Oculomotor: normal IV. Trochlear: normal V. Trigeminal: normal VI. Abducens: normal VII. Facial: normal VIII. Acoustic/Vestibular: normal IX. Glossopharyngeal: normal X. Vagus: normal XI. Spinal Accessory: normal XII. Hypoglossal: normal  Motor and other Tests Lhermittes: negative Rhomberg: negative    Right Left Hoffman's: normal normal Clonus: normal normal Babinski: normal normal SLR: negative negative Patrick's Corky Sox): negative negative Toe Walk: normal normal Toe Lift: normal normal Heel Walk: normal normal SI Joint: nontender nontender     DIAGNOSTIC RESULTS:   Noncontrast lumbar MRI showed retrolisthesis of L2 on L3, L4 on L5, and L5 on S1.  She has multilevel degeneration of her lumbar spine.  L4-L5 there is a right lateral disc herniation that is impinging on the descending L5 nerve root with a moderate foraminal narrowing and moderate central canal stenosis.  She also has L5-S1 disc protrusion causing foraminal stenosis.     IMPRESSION:   The patient is having significant right-sided low back pain with right lower extremity radiculopathy.  She has a disc rupture at L4-L5 causing spinal stenosis and severe right-sided foraminal stenosis with nerve compression and a rupture at L5-S1 causing moderate spinal stenosis, and bilateral foraminal stenosis with nerve compression.  Her exam is benign  PLAN:  The patient was hesitant to proceed with surgery due to her age and Past complications with anesthesia.  She opted to try additional steroid injections. She will see  Dr. Davy Pique for Transforaminal epidural steroid injection at L4-L5 on the right and L5-S1 on the right. She will follow-up in the clinic 2-3 weeks after her injection.  If the injections offer no relief we will discuss surgical options to relieve her symptoms.  Orders: Office Procedures/Services: Assessment Service Comments  M51.26 Lumbar Spine - Transforaminal - right - L4-L5 - L5-S1    Instruction(s)/Education: Assessment Instruction  R03.0 Hypertension education   Completed Orders (this encounter) Order Details Reason Side Interpretation Result Initial Treatment Date Region  Hypertension education Continue to monitor blood pressure. If blood pressure remains elevated contact primary care         Assessment/Plan   # Detail Type Description   1. Assessment DDD (degenerative disc disease), lumbar (M51.36).       2. Assessment Lumbar radiculopathy (M54.16).       3. Assessment Dorsalgia, unspecified (M54.9).       4. Assessment Herniated nucleus pulposus, lumbar (M51.26).   Plan Orders Lumbar Spine - Transforaminal - right - L4-L5 - L5-S1.       5. Assessment Lumbar stenosis with neurogenic claudication (I43.329).       6.  Assessment Lumbar spondylosis (M47.816).       7. Assessment Elevated blood-pressure reading, w/o diagnosis of htn (R03.0).         Pain Management Plan Pain Scale: 10/10. Method: Numeric Pain Intensity Scale. Location: back. Onset: 05/29/2015. Duration: varies. Quality: discomforting. Pain management follow-up plan of care: Patient taking medications as prescribed.              Provider:  Marchia Meiers. Vertell Limber MD  12/07/2019 04:29 PM    Dictation edited by: Marchia Meiers. Vertell Limber    CC Providers: Shaune Leeks [address] [place],  [place]  [address]-   John Keeling  [place], [place] [address]-               Electronically signed by Marchia Meiers Vertell Limber MD on 12/07/2019 04:29 PM

## 2019-12-28 NOTE — Progress Notes (Signed)
Awake, alert, conversant.  Leg pain improved.  Full strength bilateral PF, DF, EHL.  Patient is doing well.

## 2019-12-28 NOTE — Progress Notes (Signed)
Vanc was ordered for post op abx x1 unless the patient has a drain. She doesn't have a drain. Due to her age and renal function (41 ml/min), the pre-op dose will last her more than 24 hrs. No need for any further vanc.  Onnie Boer, PharmD, BCIDP, AAHIVP, CPP Infectious Disease Pharmacist 12/28/2019 5:32 PM

## 2019-12-29 ENCOUNTER — Encounter (HOSPITAL_COMMUNITY): Payer: Self-pay | Admitting: Neurosurgery

## 2019-12-29 DIAGNOSIS — M5416 Radiculopathy, lumbar region: Secondary | ICD-10-CM | POA: Diagnosis not present

## 2019-12-29 NOTE — Evaluation (Signed)
Physical Therapy Evaluation Patient Details Name: Debra Richards MRN: [id number] DOB: 01/01/1926 Today's Date: 12/29/2019   History of Present Illness  Pt is a 86 y/o female who presents s/p L4-L5 microdiscectomy/decompression after herniating the disc. PMH significant for RA, osteoporosis, mitral valve prolapse, a-fib (LBBB), hypothyroidism, R TKR 2013, R THR 2014.  Clinical Impression  Pt admitted with above diagnosis. At the time of PT eval, pt was able to demonstrate transfers and ambulation with gross min guard assist and RW for support. Pt was educated on precautions, appropriate activity progression, and car transfer. Pt currently with functional limitations due to the deficits listed below (see PT Problem List). Pt will benefit from skilled PT to increase their independence and safety with mobility to allow discharge to the venue listed below.     Follow Up Recommendations No PT follow up;Supervision for mobility/OOB    Equipment Recommendations  None recommended by PT    Recommendations for Other Services       Precautions / Restrictions Precautions Precautions: Fall;Back Precaution Booklet Issued: Yes (comment) Precaution Comments: reviewed handout in detail and pt was cued for precautions during functional mobility. Required Braces or Orthoses:  (No brace needed order) Restrictions Weight Bearing Restrictions: No      Mobility  Bed Mobility Overal bed mobility: Needs Assistance Bed Mobility: Rolling;Sidelying to Sit Rolling: Supervision Sidelying to sit: Min guard       General bed mobility comments: in chair on arrival  Transfers Overall transfer level: Needs assistance Equipment used: Rolling walker (2 wheeled) Transfers: Sit to/from Stand Sit to Stand: Min guard         General transfer comment: VC's for hand placement on seated surface for safety. Wide BOS recommended for controlled descent to chair at end of session. Pt reports she is just going to  "fall into the chair" like she's used to doing at home. Increased education required.   Ambulation/Gait Ambulation/Gait assistance: Min guard Gait Distance (Feet): 200 Feet Assistive device: Rolling walker (2 wheeled) Gait Pattern/deviations: Step-through pattern;Decreased stride length;Trunk flexed Gait velocity: Decreased Gait velocity interpretation: <1.31 ft/sec, indicative of household ambulator General Gait Details: VC's for improved posture - is able to make corrective changes and maintain them however often showing me how bent she was before surgery, essentially breaking her back precautions and requiring increased education for maintenance.   Stairs Stairs: Yes Stairs assistance: Min assist Stair Management: One rail Left;Step to pattern;Forwards Number of Stairs: 3 General stair comments: VC's for sequencing and general safety. Min assist through R hand [facility]) as pt had railing on the L  Wheelchair Mobility    Modified Rankin (Stroke Patients Only)       Balance Overall balance assessment: Needs assistance Sitting-balance support: No upper extremity supported;Feet supported Sitting balance-Leahy Scale: Fair     Standing balance support: Bilateral upper extremity supported;During functional activity Standing balance-Leahy Scale: Fair Standing balance comment: Statically - for dynamic tasks pt was reaching for external support                             Pertinent Vitals/Pain Pain Assessment: 0-10 Pain Score: 8  Faces Pain Scale: Hurts little more Pain Location: Incision site Pain Descriptors / Indicators: Operative site guarding;Tender Pain Intervention(s): Limited activity within patient's tolerance;Monitored during session;Repositioned    Home Living Family/patient expects to be discharged to:: Private residence Living Arrangements: Alone;Children Available Help at Discharge: Family Type of Home: House Home Access: Stairs  to enter Entrance  Stairs-Rails: Left Entrance Stairs-Number of Steps: 2 Home Layout: Two level;Able to live on main level with bedroom/bathroom Home Equipment: Shower seat - built in;Shower seat;Walker - 4 wheels;Walker - 2 wheels;Cane - single point Additional Comments: daughter lives 8 miles away and plans to stay with her     Prior Function Level of Independence: Independent               Hand Dominance   Dominant Hand: Right    Extremity/Trunk Assessment   Upper Extremity Assessment Upper Extremity Assessment: Defer to OT evaluation    Lower Extremity Assessment Lower Extremity Assessment: Generalized weakness    Cervical / Trunk Assessment Cervical / Trunk Assessment: Other exceptions Cervical / Trunk Exceptions: s/p surgery no brace  Communication   Communication: No difficulties  Cognition Arousal/Alertness: Awake/alert Behavior During Therapy: Impulsive (mildly) Overall Cognitive Status: Impaired/Different from baseline Area of Impairment: Attention;Safety/judgement;Memory                   Current Attention Level: Selective Memory: Decreased recall of precautions;Decreased short-term memory   Safety/Judgement: Decreased awareness of safety     General Comments: Pt mildly impulsive - especially with quick movements that were breaking back precautions. Pt perseverating on the way she is used to doing things at home. PT provided extensive education on maintaining precautions and safety awareness, and then stating "But that's not the way I do it at home, I'm just going to do it the way I do at home."      General Comments      Exercises     Assessment/Plan    PT Assessment Patient needs continued PT services  PT Problem List Decreased strength;Decreased activity tolerance;Decreased balance;Decreased mobility;Decreased knowledge of use of DME;Decreased safety awareness;Decreased knowledge of precautions;Pain       PT Treatment Interventions DME instruction;Gait  training;Stair training;Functional mobility training;Therapeutic activities;Therapeutic exercise;Neuromuscular re-education;Patient/family education    PT Goals (Current goals can be found in the Care Plan section)  Acute Rehab PT Goals Patient Stated Goal: Home tomorrow - has already called daughter and told her not to come today as she is going home tomorrow. Per nursing staff pt is ready for d/c today.  PT Goal Formulation: With patient Time For Goal Achievement: 01/05/20 Potential to Achieve Goals: Good    Frequency Min 5X/week   Barriers to discharge        Co-evaluation               AM-PAC PT "6 Clicks" Mobility  Outcome Measure Help needed turning from your back to your side while in a flat bed without using bedrails?: None Help needed moving from lying on your back to sitting on the side of a flat bed without using bedrails?: A Little Help needed moving to and from a bed to a chair (including a wheelchair)?: A Little Help needed standing up from a chair using your arms (e.g., wheelchair or bedside chair)?: A Little Help needed to walk in hospital room?: A Little Help needed climbing 3-5 steps with a railing? : A Little 6 Click Score: 19    End of Session Equipment Utilized During Treatment: Gait belt Activity Tolerance: Patient tolerated treatment well Patient left: in chair;with call bell/phone within reach Nurse Communication: Mobility status PT Visit Diagnosis: Unsteadiness on feet (R26.81);Pain Pain - part of body:  (back)    Time: 0347-4259 PT Time Calculation (min) (ACUTE ONLY): 36 min   Charges:   PT Evaluation $PT  Eval Moderate Complexity: 1 Mod PT Treatments $Gait Training: 8-22 mins        Rolinda Roan, PT, DPT Acute Rehabilitation Services Pager: [phone] Office: [phone]   Thelma Comp 12/29/2019, 11:42 AM

## 2019-12-29 NOTE — Progress Notes (Signed)
Subjective: Patient reports much improvement in her pain. She reports "some soreness in my low back around my incision." No acute events reported over night. She has ambulated to the bathroom with assistance from her walker. RN reports ambulation in the hall with no difficulties.   Objective: Vital signs in last 24 hours: Temp:  [97 F (36.1 C)-98 F (36.7 C)] 97.6 F (36.4 C) (08/04 0737) Pulse Rate:  [57-76] 76 (08/04 0737) Resp:  [12-18] 16 (08/04 0737) BP: (110-127)/(49-73) 110/55 (08/04 0737) SpO2:  [98 %-100 %] 100 % (08/04 0737)  Intake/Output from previous day: 08/03 0701 - 08/04 0700 In: 1163 [P.O.:60; I.V.:1003; IV Piggyback:100] Out: 430 [Urine:400; Blood:30] Intake/Output this shift: No intake/output data recorded.  Physical Exam:  Patient is in good spirits, A/O X3, and conversant. She is sitting up on the side of the bed in no distress. MAEW with full strength bilateral PF, DF, EHL. Dressing is CDI. Incision has no erythema, drainage or edema.   Lab Results: No results for input(s): WBC, HGB, HCT, PLT in the last 72 hours. BMET No results for input(s): NA, K, CL, CO2, GLUCOSE, BUN, CREATININE, CALCIUM in the last 72 hours.  Studies/Results: DG Lumbar Spine 2-3 Views  Result Date: 12/28/2019 CLINICAL DATA:  Intraoperative localization for micro discectomy EXAM: LUMBAR SPINE - 2-3 VIEW COMPARISON:  06/09/2019 FINDINGS: Five lumbar type vertebral bodies are well visualized. A needle is noted within the posterior soft tissues between the L4 and L5 spinous processes. Subsequent images demonstrate surgical retractors and instruments at L4-5. IMPRESSION: Intraoperative localization at L4-5. Electronically Signed   By: Inez Catalina M.D.   On: 12/28/2019 15:52    Assessment/Plan:  Patient is doing well overall and her symptoms are much improved from her pre op symptoms. Per RN, she is ambulating well and should be ready for discharge this afternoon once the patient works  with PT and OT. The patient reported that her daughter in an RN and is taking time off to help the patient once she is discharged home. Work with therapies today and will reassess readiness for discharge after PT/OT input. Continue pain control and encouraging ambulation.   LOS: 1 day    Marvis Moeller 12/29/2019, 8:21 AM

## 2019-12-29 NOTE — Evaluation (Signed)
Occupational Therapy Evaluation Patient Details Name: Debra Richards MRN: [id number] DOB: [birth year] Today's Date: 12/29/2019    History of Present Illness Pt is a 86 y/o female who presents s/p L4-L5 microdiscectomy/decompression after herniating the disc. PMH significant for RA, osteoporosis, mitral valve prolapse, a-fib (LBBB), hypothyroidism, R TKR 2013, R THR 2014.   Clinical Impression   Patient is s/p L4-5 Microdiscectomy surgery resulting in functional limitations due to the deficits listed below (see OT problem list). Pt currently needs reminders for back precautions with all adls. Pt will requires family (A) or reacher for LB dressing. Pt dressed for home to practice reacher use. Pt reports daughter has a Secondary school teacher at home. Pt very concerned and fixated on the medicare handout provided at start of session and any cost. Pt states "I am insurance poor" Patient will benefit from skilled OT acutely to increase independence and safety with ADLS to allow discharge home.     Follow Up Recommendations  No OT follow up    Equipment Recommendations  None recommended by OT    Recommendations for Other Services       Precautions / Restrictions Precautions Precautions: Fall;Back Precaution Booklet Issued: Yes (comment) Precaution Comments: reviewed back precautions Required Braces or Orthoses:  (No brace needed order) Restrictions Weight Bearing Restrictions: No      Mobility Bed Mobility Overal bed mobility: Needs Assistance Bed Mobility: Rolling;Sidelying to Sit Rolling: Supervision Sidelying to sit: Min guard       General bed mobility comments: in chair on arrival  Transfers Overall transfer level: Needs assistance Equipment used: Rolling walker (2 wheeled) Transfers: Sit to/from Stand Sit to Stand: Min guard         General transfer comment: VC's for hand placement on seated surface for safety. Wide BOS recommended for controlled descent to chair at end of  session. Pt reports she is just going to "fall into the chair" like she's used to doing at home. Increased education required.     Balance Overall balance assessment: Needs assistance         Standing balance support: Bilateral upper extremity supported;During functional activity Standing balance-Leahy Scale: Fair                             ADL either performed or assessed with clinical judgement   ADL Overall ADL's : Needs assistance/impaired Eating/Feeding: Modified independent;Sitting   Grooming: Wash/dry hands;Standing;Supervision/safety           Upper Body Dressing : Min guard;Sitting   Lower Body Dressing: Minimal assistance;With adaptive equipment;Cueing for back precautions;Sit to/from stand Lower Body Dressing Details (indicate cue type and reason): pt using reacher to dress and reports "my daughter has one of these for her leg due to her blood clot" Toilet Transfer: Minimal assistance;Regular Toilet;RW Toilet Transfer Details (indicate cue type and reason): pt requires (A) to descend to commode safely Toileting- Clothing Manipulation and Hygiene: Supervision/safety Toileting - Clothing Manipulation Details (indicate cue type and reason): provided toilet paper and cued about precautions with bending during task at home     Functional mobility during ADLs: Min guard;Rolling walker General ADL Comments: pt educated on back precautions during ADLS and requires (A) to call for lunch. pt needed (A) mIN to read and select from lunch menu     Vision Baseline Vision/History: No visual deficits       Perception     Praxis      Pertinent Vitals/Pain Pain  Assessment: Faces Pain Score: 8  Faces Pain Scale: Hurts little more Pain Location: Incision site Pain Descriptors / Indicators: Operative site guarding;Tender Pain Intervention(s): Monitored during session;Premedicated before session;Repositioned     Hand Dominance Right   Extremity/Trunk  Assessment Upper Extremity Assessment Upper Extremity Assessment: Overall WFL for tasks assessed   Lower Extremity Assessment Lower Extremity Assessment: Defer to PT evaluation   Cervical / Trunk Assessment Cervical / Trunk Assessment: Other exceptions Cervical / Trunk Exceptions: s/p surgery no brace   Communication Communication Communication: No difficulties   Cognition Arousal/Alertness: Awake/alert Behavior During Therapy: Impulsive (mildly) Overall Cognitive Status: Impaired/Different from baseline Area of Impairment: Attention;Safety/judgement;Memory                   Current Attention Level: Selective Memory: Decreased recall of precautions;Decreased short-term memory   Safety/Judgement: Decreased awareness of safety     General Comments: pt perserverating on medicare handout provided while OT helped with ordering lunch. pt attempting to call daughter multiple times but unable to correctly dial out. Pt tangential at times about family unrelated to session and needs redirection back to task.    General Comments       Exercises     Shoulder Instructions      Home Living Family/patient expects to be discharged to:: Private residence Living Arrangements: Alone;Children Available Help at Discharge: Family Type of Home: House Home Access: Stairs to enter [employer] of Steps: 2 Entrance Stairs-Rails:  (rail ) Home Layout: Two level;Able to live on main level with bedroom/bathroom     Bathroom Shower/Tub: Walk-in shower;Tub/shower unit   [employer]: Standard     Home Equipment: Shower seat - built in;Shower seat;Walker - 4 wheels;Walker - 2 wheels;Cane - single point   Additional Comments: daughter lives 8 miles away and plans to stay with her       Prior Functioning/Environment Level of Independence: Independent                 OT Problem List: Decreased strength;Decreased activity tolerance;Impaired balance (sitting and/or  standing);Decreased knowledge of use of DME or AE;Decreased knowledge of precautions;Pain;Decreased cognition      OT Treatment/Interventions: Self-care/ADL training;Therapeutic exercise;Energy conservation;DME and/or AE instruction;Therapeutic activities;Cognitive remediation/compensation;Patient/family education;Balance training    OT Goals(Current goals can be found in the care plan section) Acute Rehab OT Goals Patient Stated Goal: Home tomorrow - has already called daughter and told her not to come today as she is going home tomorrow. Per nursing staff pt is ready for d/c today.  OT Goal Formulation: With patient Time For Goal Achievement: 01/12/20 Potential to Achieve Goals: Good  OT Frequency: Min 2X/week   Barriers to D/C:            Co-evaluation              AM-PAC OT "6 Clicks" Daily Activity     Outcome Measure Help from another person eating meals?: None Help from another person taking care of personal grooming?: A Little Help from another person toileting, which includes using toliet, bedpan, or urinal?: A Little Help from another person bathing (including washing, rinsing, drying)?: A Little Help from another person to put on and taking off regular upper body clothing?: A Little Help from another person to put on and taking off regular lower body clothing?: A Lot 6 Click Score: 18   End of Session Equipment Utilized During Treatment: Rolling walker Nurse Communication: Mobility status;Precautions  Activity Tolerance: Patient tolerated treatment well Patient left:  in chair;with call bell/phone within reach  OT Visit Diagnosis: Unsteadiness on feet (R26.81);Muscle weakness (generalized) (M62.81)                Time: 1010-1040 OT Time Calculation (min): 30 min Charges:  OT General Charges $OT Visit: 1 Visit OT Evaluation $OT Eval Moderate Complexity: 1 Mod OT Treatments $Self Care/Home Management : 8-22 mins   Brynn, OTR/L  Acute Rehabilitation  Services Pager: [phone] Office: [phone] .   Jeri Modena 12/29/2019, 11:27 AM

## 2019-12-29 NOTE — Plan of Care (Signed)
Pt and daughter given D/C instructions with verbal understanding. Pt's incision is clean and dry with no sign of infection. Pt's IV was removed prior to D/C. Pt D/C'd home via wheelchair per MD order. Pt is stable @ D/C and has no other needs at this time. Holli Humbles, RN

## 2019-12-29 NOTE — Discharge Summary (Signed)
Physician Discharge Summary  Patient ID: ARDYN FORGE MRN: [id number] DOB/AGE: 02/17/1926 86 y.o.  Admit date: 12/28/2019 Discharge date: 12/29/2019  Admission Diagnoses: Herniated nucleus pulposus, Lumbar L 4/ 5 right with radiculopathy, stenosis, lumbago, spondylolisthesis  Discharge Diagnoses: Herniated nucleus pulposus, Lumbar L 4/ 5 right with radiculopathy, stenosis, lumbago, spondylolisthesis  Active Problems:   Herniated lumbar disc without myelopathy   Discharged Condition: good  Hospital Course: Patient is a 86 year old woman with intractable right leg pain with a disc rupture on the right at L 4/5 level with significant right leg weakness. It was elected to take her to surgery for right L microdiscectomy. After successfully removing the herniated disc and decompressing the nerve, the patient was extubated in the operating room without complication.  She was then taken to PACU for recovery.  She was then transferred to 3 cc for observation.  While on 3C she ambulated with nursing staff and worked with PT/OT.  At this point she is stable and ready for discharge to home.  Consults: None  Significant Diagnostic Studies: radiology: X-ray  Treatments: surgery: Right Lumbar Four-Lumbar Five Microdiscectomy   Discharge Exam: Blood pressure (!) 110/55, pulse 76, temperature 97.6 F (36.4 C), resp. rate 16, SpO2 100 %. Physical Exam: Patient is A/O X3, and conversant. She is doing well and is in no apparent distress.  She is in MAEW bilaterally with full strength PF, DF, EHL. Dressing is CDI. Incision has no erythema, drainage or edema.   Disposition: Discharge disposition: 01-Home or Self Care       Discharge Instructions    Incentive spirometry RT   Complete by: As directed      Allergies as of 12/29/2019      Reactions   Contrast Media [iodinated Diagnostic Agents]    Arm swelled up and was painful after IVP years ago/    Levaquin [levofloxacin In D5w]    Made her  sick all over   Penicillins Hives   03/04/13: Pt has tolerated Amoxicillin/Unsyn without reaction   Ciprofloxacin Rash      Medication List    STOP taking these medications   amoxicillin 500 MG capsule Commonly known as: AMOXIL   traMADol 50 MG tablet Commonly known as: ULTRAM     TAKE these medications   [place]away 0.025 % ophthalmic solution Generic drug: ketotifen Place 1 drop into both eyes 2 (two) times daily as needed (allergy eyes.).   aspirin EC 81 MG tablet Take 81 mg by mouth daily. Swallow whole.   cholecalciferol 1000 units tablet Commonly known as: VITAMIN D Take 1,000 Units by mouth daily.   denosumab 60 MG/ML Soln injection Commonly known as: PROLIA Inject 60 mg into the skin every 6 (six) months. Administer in upper arm, thigh, or abdomen   isosorbide mononitrate 30 MG 24 hr tablet Commonly known as: IMDUR Take 0.5 tablets (15 mg total) by mouth daily.   levothyroxine 75 MCG tablet Commonly known as: SYNTHROID Take 75 mcg by mouth daily before breakfast.   omeprazole 40 MG capsule Commonly known as: PRILOSEC Take 40 mg by mouth 3 (three) times a week.   vitamin E 180 MG (400 UNITS) capsule Take 400 Units by mouth daily.   Zinc 50 MG Tabs Take 50 mg by mouth daily.        Signed: Marvis Moeller, DNP, AGNP-C 12/29/2019, 12:14 PM

## 2019-12-29 NOTE — Care Management Obs Status (Signed)
[facility] NOTIFICATION   Patient Details  Name: BEULA JOYNER MRN: [id number] Date of Birth: 12-26-1925   Medicare Observation Status Notification Given:  Yes    Angelita Ingles, RN 12/29/2019, 10:07 AM

## 2019-12-29 NOTE — Care Management CC44 (Signed)
Condition Code 44 Documentation Completed  Patient Details  Name: MUNIRAH DOERNER MRN: [id number] Date of Birth: 1926/01/05   Condition Code 44 given:  Yes Patient signature on Condition Code 44 notice:  Yes Documentation of 2 MD's agreement:  Yes Code 44 added to claim:  Yes    Angelita Ingles, RN 12/29/2019, 10:07 AM

## 2019-12-30 ENCOUNTER — Emergency Department (HOSPITAL_COMMUNITY)
Admission: EM | Admit: 2019-12-30 | Discharge: 2019-12-30 | Disposition: A | Discharge status: Home / Self Care | Payer: Medicare Other | Attending: Emergency Medicine | Admitting: Emergency Medicine

## 2019-12-30 ENCOUNTER — Encounter (HOSPITAL_COMMUNITY): Payer: Self-pay

## 2019-12-30 ENCOUNTER — Other Ambulatory Visit: Payer: Self-pay

## 2019-12-30 DIAGNOSIS — I2510 Atherosclerotic heart disease of native coronary artery without angina pectoris: Secondary | ICD-10-CM | POA: Diagnosis not present

## 2019-12-30 DIAGNOSIS — T887XXA Unspecified adverse effect of drug or medicament, initial encounter: Secondary | ICD-10-CM | POA: Insufficient documentation

## 2019-12-30 DIAGNOSIS — R531 Weakness: Secondary | ICD-10-CM | POA: Diagnosis not present

## 2019-12-30 DIAGNOSIS — R251 Tremor, unspecified: Secondary | ICD-10-CM | POA: Diagnosis not present

## 2019-12-30 DIAGNOSIS — I959 Hypotension, unspecified: Secondary | ICD-10-CM | POA: Diagnosis not present

## 2019-12-30 DIAGNOSIS — E039 Hypothyroidism, unspecified: Secondary | ICD-10-CM | POA: Diagnosis not present

## 2019-12-30 DIAGNOSIS — T40425A Adverse effect of tramadol, initial encounter: Secondary | ICD-10-CM | POA: Diagnosis not present

## 2019-12-30 DIAGNOSIS — Z96651 Presence of right artificial knee joint: Secondary | ICD-10-CM | POA: Insufficient documentation

## 2019-12-30 LAB — COMPREHENSIVE METABOLIC PANEL
ALT: 19 U/L (ref 0–44)
AST: 23 U/L (ref 15–41)
Albumin: 3.4 g/dL — ABNORMAL LOW (ref 3.5–5.0)
Alkaline Phosphatase: 49 U/L (ref 38–126)
Anion gap: 6 (ref 5–15)
BUN: 21 mg/dL (ref 8–23)
CO2: 26 mmol/L (ref 22–32)
Calcium: 8.3 mg/dL — ABNORMAL LOW (ref 8.9–10.3)
Chloride: 106 mmol/L (ref 98–111)
Creatinine, Ser: 0.8 mg/dL (ref 0.44–1.00)
GFR calc Af Amer: >60 mL/min (ref >60)
GFR calc non Af Amer: >60 mL/min (ref >60)
Glucose, Bld: 89 mg/dL (ref 70–99)
Potassium: 4.1 mmol/L (ref 3.5–5.1)
Sodium: 138 mmol/L (ref 135–145)
Total Bilirubin: 0.8 mg/dL (ref 0.3–1.2)
Total Protein: 5.8 g/dL — ABNORMAL LOW (ref 6.5–8.1)

## 2019-12-30 LAB — CBC WITH DIFFERENTIAL/PLATELET
Abs Immature Granulocytes: 0.03 10*3/uL (ref 0.00–0.07)
Basophils Absolute: 0 10*3/uL (ref 0.0–0.1)
Basophils Relative: 0 %
Eosinophils Absolute: 0 10*3/uL (ref 0.0–0.5)
Eosinophils Relative: 0 %
HCT: 40.6 % (ref 36.0–46.0)
Hemoglobin: 13.1 g/dL (ref 12.0–15.0)
Immature Granulocytes: 0 %
Lymphocytes Relative: 10 %
Lymphs Abs: 0.7 10*3/uL (ref 0.7–4.0)
MCH: 33.5 pg (ref 26.0–34.0)
MCHC: 32.3 g/dL (ref 30.0–36.0)
MCV: 103.8 fL — ABNORMAL HIGH (ref 80.0–100.0)
Monocytes Absolute: 0.8 10*3/uL (ref 0.1–1.0)
Monocytes Relative: 10 %
Neutro Abs: 6 10*3/uL (ref 1.7–7.7)
Neutrophils Relative %: 80 %
Platelets: 135 10*3/uL — ABNORMAL LOW (ref 150–400)
RBC: 3.91 MIL/uL (ref 3.87–5.11)
RDW: 13.5 % (ref 11.5–15.5)
WBC: 7.6 10*3/uL (ref 4.0–10.5)
nRBC: 0 % (ref 0.0–0.2)

## 2019-12-30 MED ORDER — SODIUM CHLORIDE 0.9 % IV BOLUS
500.0000 mL | Freq: Once | INTRAVENOUS | Status: AC
  Administered 2019-12-30: 500 mL via INTRAVENOUS

## 2019-12-30 NOTE — ED Triage Notes (Signed)
EMS reports pt had back surgery Tuesday and was discharged yesterday.  Reports had PT at home yesterday.  Today after breakfast pt became pale, clammy, and weak.  EMS reports bp was 90 palpated.  Reports color improved after laying on stretcher.  Pt had a tramadol at home pta.  Reports doesn't tolerate pain meds well.  Says they make her drowsy for days.  Unable to do any twisting due to the recent back surgey.  CBG 138.

## 2019-12-30 NOTE — Discharge Instructions (Addendum)
We saw in the ER for tremor-like episode and weakness. Your exam here is normal and you are not having similar symptoms.  Blood work is reassuring.  We suspect that you could be having side effects from pain medications or anesthesia wearing off. It does not appear to [place] that you had a seizure.  Return to the ER immediately if you start having similar symptoms that are lasting for more than 5 minutes, if there is any fainting spell or unresponsiveness associated with it.  Also return to the ER if you start having any focal neurologic deficits like one-sided weakness, numbness, vision change.

## 2020-01-01 NOTE — ED Provider Notes (Signed)
[facility] EMERGENCY DEPARTMENT Provider Note   CSN: [id number] Arrival date & time: 12/30/19  1124     History No chief complaint on file.   Debra Richards is a 86 y.o. female.  HPI    Pt comes in with cc of tremor and weakness. According to the patient and his daughter, patient was eating breakfast when suddenly she started having tremor/shaking on the both side and patient's L upper extremity went weak. Pt never lost consciousness and there was no incontinence. Pt got nauseated and was clammy per daughter, she thinks patient's eye rolled back. She doesn't think patient had seizures.  Pt recently had back surgery and took tramadol. She is very sensitive to pain meds. Pt was informed that she can have side effects from anesthesia.  No chest pain, palpitations, dizziness, dib.  Past Medical History:  Diagnosis Date  . Complication of anesthesia    Trouble waking up  . GERD (gastroesophageal reflux disease)   . History of kidney stones   . Hypothyroidism   . LBBB (left bundle branch block)    a fib  . MVP (mitral valve prolapse)   . Osteoporosis   . Palpitations   . RA (rheumatoid arthritis) ([place])   . Right ureteral stone   . Rosacea     Patient Active Problem List   Diagnosis Date Noted  . Herniated lumbar disc without myelopathy 12/28/2019  . Weakness 02/15/2016  . Acute parotitis 02/15/2016  . Atrial fibrillation ([place]) 02/15/2016  . Hypokalemia 02/15/2016  . Atypical chest pain 10/27/2013  . Anxiety 10/27/2013  . Pectus excavatum 04/25/2013  . UTI (lower urinary tract infection) 02/28/2013  . Hypotension 02/28/2013  . Acute renal failure ([place]) 02/28/2013  . Generalized weakness 02/28/2013  . Thrombocytopenia ([place]) 02/28/2013  . Acute encephalopathy 02/28/2013  . Altered mental status 10/14/2012  . Hip fracture, right ([place]) 10/12/2012  . Altered awareness, transient 03/25/2012  . Delirium 03/25/2012  . Leukocytosis 03/25/2012  . Postop Acute blood loss  anemia 03/24/2012  . OA (osteoarthritis) of knee 03/23/2012  . Right ovarian cyst   . MVP (mitral valve prolapse)   . Coronary artery disease   . GERD (gastroesophageal reflux disease)   . History of kidney stones   . RA (rheumatoid arthritis) ([place])   . Dysrhythmia   . Lateral meniscal tear   . Rosacea   . Right ureteral stone   . LBBB (left bundle branch block)   . Edema   . Hypothyroidism   . GERD 01/22/2010  . CONSTIPATION 01/22/2010  . DYSPHAGIA UNSPECIFIED 01/22/2010  . ABDOMINAL PAIN RIGHT LOWER QUADRANT 01/22/2010    Past Surgical History:  Procedure Laterality Date  . BACK SURGERY    . CATARACT EXTRACTION W/ INTRAOCULAR LENS  IMPLANT, BILATERAL  4 yrs ago   both eyes  . CHOLECYSTECTOMY  2005  . EXTRACORPOREAL SHOCK WAVE LITHOTRIPSY  09-24-10   and 1990  . Femur fx    . HIP ARTHROPLASTY Right 10/12/2012   Procedure: ARTHROPLASTY BIPOLAR HIP;  Surgeon: Gearlean Alf, MD;  Location: [facility];  Service: Orthopedics;  Laterality: Right;  . KNEE ARTHROSCOPY  04/17/2011, right   Procedure: ARTHROSCOPY KNEE;  Surgeon: Gearlean Alf;  Location: [place];  Service: Orthopedics;  Laterality: Right;  WITH LATERAL MENISCAL DEBRIDEMENT  . LUMBAR LAMINECTOMY/DECOMPRESSION MICRODISCECTOMY Right 12/28/2019   Procedure: Right Lumbar Four-Lumbar Five Microdiscectomy;  Surgeon: Erline Levine, MD;  Location: [place];  Service: Neurosurgery;  Laterality: Right;  Right  Lumbar Four-Lumbar Five Microdiscectomy  . MOLES EXCISED  2012   on nose  . Sternum Fx    . TOTAL KNEE ARTHROPLASTY  03/23/2012   Procedure: TOTAL KNEE ARTHROPLASTY;  Surgeon: Gearlean Alf, MD;  Location: [facility];  Service: Orthopedics;  Laterality: Right;  Marland Kitchen VAGINAL HYSTERECTOMY  1970   Leiomyomata     OB History    Gravida  2   Para  2   Term  2   Preterm      AB      Living  2     SAB      TAB      Ectopic      Multiple      Live Births              Family History  Problem  Relation Age of Onset  . Cancer Mother        COLON  . Heart disease Father        STROKE  . Stroke Father   . Ovarian cancer Maternal Grandmother   . Crohn's disease Daughter     Social History   Tobacco Use  . Smoking status: Never Smoker  . Smokeless tobacco: Never Used  Vaping Use  . Vaping Use: Never used  Substance Use Topics  . Alcohol use: No    Alcohol/week: 0.0 standard drinks  . Drug use: No    Home Medications Prior to Admission medications   Medication Sig Start Date End Date Taking? Authorizing Provider  aspirin EC 81 MG tablet Take 81 mg by mouth daily. Swallow whole.   Yes [provider]  cholecalciferol (VITAMIN D) 1000 units tablet Take 1,000 Units by mouth daily.    Yes [provider]  ketotifen (ALAWAY) 0.025 % ophthalmic solution Place 1 drop into both eyes 2 (two) times daily as needed (allergy eyes.).   Yes [provider]  levothyroxine (SYNTHROID, LEVOTHROID) 75 MCG tablet Take 75 mcg by mouth daily before breakfast.   Yes [provider]  omeprazole (PRILOSEC) 40 MG capsule Take 40 mg by mouth 3 (three) times a week.  09/15/19  Yes [provider]  traMADol (ULTRAM) 50 MG tablet Take 50 mg by mouth every 6 (six) hours as needed. 12/29/19  Yes [provider]  vitamin E 400 UNIT capsule Take 400 Units by mouth daily.    Yes [provider]  Zinc 50 MG TABS Take 50 mg by mouth daily.   Yes [provider]  denosumab (PROLIA) 60 MG/ML SOLN injection Inject 60 mg into the skin every 6 (six) months. Administer in upper arm, thigh, or abdomen    [provider]  isosorbide mononitrate (IMDUR) 30 MG 24 hr tablet Take 0.5 tablets (15 mg total) by mouth daily. Patient not taking: Reported on 12/30/2019 04/06/19 11/02/19  Satira Sark, MD    Allergies    Contrast media [iodinated diagnostic agents], Levaquin [levofloxacin in d5w], Penicillins, and Ciprofloxacin  Review of  Systems   Review of Systems  Constitutional: Positive for activity change.  Respiratory: Negative for shortness of breath.   Cardiovascular: Negative for chest pain.  Gastrointestinal: Negative for abdominal pain.  Neurological: Negative for seizures, syncope, facial asymmetry, weakness, light-headedness and headaches.  All other systems reviewed and are negative.   Physical Exam Updated Vital Signs BP (!) 99/50 (BP Location: Left Arm)   Pulse 70   Temp 98.5 F (36.9 C) (Oral)   Resp 18  Ht 5\' 5"  (1.651 m)   Wt 60 kg   SpO2 100%   BMI 22.01 kg/m   Physical Exam Vitals and nursing note reviewed.  Constitutional:      Appearance: She is well-developed.  HENT:     Head: Normocephalic and atraumatic.  Eyes:     Extraocular Movements: Extraocular movements intact.     Pupils: Pupils are equal, round, and reactive to light.  Cardiovascular:     Rate and Rhythm: Normal rate.  Pulmonary:     Effort: Pulmonary effort is normal.  Abdominal:     General: Bowel sounds are normal.  Musculoskeletal:     Cervical back: Normal range of motion and neck supple.  Skin:    General: Skin is warm and dry.  Neurological:     General: No focal deficit present.     Mental Status: She is alert and oriented to person, place, and time.     Cranial Nerves: No cranial nerve deficit.     Sensory: No sensory deficit.     Motor: No weakness.     ED Results / Procedures / Treatments   Labs (all labs ordered are listed, but only abnormal results are displayed) Labs Reviewed  CBC WITH DIFFERENTIAL/PLATELET - Abnormal; Notable for the following components:      Result Value   MCV 103.8 (*)    Platelets 135 (*)    All other components within normal limits  COMPREHENSIVE METABOLIC PANEL - Abnormal; Notable for the following components:   Calcium 8.3 (*)    Total Protein 5.8 (*)    Albumin 3.4 (*)    All other components within normal limits    EKG EKG  Interpretation  Date/Time:  Thursday December 30 2019 13:10:05 EDT Ventricular Rate:  71 PR Interval:    QRS Duration: 129 QT Interval:  422 QTC Calculation: 459 R Axis:   23 Text Interpretation: Sinus rhythm Prolonged PR interval IVCD, consider atypical LBBB No acute changes No significant change since last tracing Confirmed by Varney Biles [phone]) on 12/30/2019 1:36:31 PM   Radiology No results found.  Procedures Procedures (including critical care time)  Medications Ordered in ED Medications  sodium chloride 0.9 % bolus 500 mL (0 mLs Intravenous Stopped 12/30/19 1538)    ED Course  I have reviewed the triage vital signs and the nursing notes.  Pertinent labs & imaging results that were available during my care of the patient were reviewed by me and considered in my medical decision making (see chart for details).    MDM Rules/Calculators/A&P                          Pt comes in with cc of tremors and weakness. Focal event.  DDx : seizure, syncope, vaso-vagal event, medication side effects.  Pt is feeling normal. Exam is normal. Observed in the ER and no cardiac events noted. Pt has normal labs.  Daughter stays with the patient. She is comfortable taking her home now, rather than wait further more more monitoring.  The patient appears reasonably screened and/or stabilized for discharge and I doubt any other medical condition or other [facility] requiring further screening, evaluation, or treatment in the ED at this time prior to discharge.   Results from the ER workup discussed with the patient face to face and all questions answered to the best of my ability. The patient is safe for discharge with strict return precautions.   Final Clinical  Impression(s) / ED Diagnoses Final diagnoses:  Tremor of both hands  Medication side effect    Rx / DC Orders ED Discharge Orders    None       Varney Biles, MD 01/01/20 1454

## 2020-01-18 NOTE — Telephone Encounter (Signed)
Pt states she will call us to make Annual as well as Prolia appt

## 2020-01-21 ENCOUNTER — Ambulatory Visit: Payer: Medicare Other | Admitting: Cardiology

## 2020-01-26 ENCOUNTER — Encounter: Payer: Self-pay | Admitting: Cardiology

## 2020-01-26 NOTE — Progress Notes (Signed)
Cardiology Office Note  Date: 01/27/2020   ID: Zanai Mallari, DOB [birth year], MRN [id number]  PCP:  Glenda Chroman, MD  Cardiologist:  Rozann Lesches, MD Electrophysiologist:  None   Chief Complaint  Patient presents with  . Cardiac follow-up    History of Present Illness: Rhetta Cleek is a 86 y.o. female last seen in May.  She presents for a follow-up visit.  She underwent lumbar microdiscectomy on the right per Dr. Vertell Limber in early August, no obvious perioperative complications.  She states that she does feel better in terms of prior leg pain, still obviously healing and has lifting and activity restrictions.  She does not report any worsening chest pain or breathlessness.  We have continued medical therapy.  Current medications are outlined below.  She states that she does not take the Imdur regularly.  Past Medical History:  Diagnosis Date  . Complication of anesthesia    Trouble waking up  . GERD (gastroesophageal reflux disease)   . History of kidney stones   . Hypothyroidism   . LBBB (left bundle branch block)   . MVP (mitral valve prolapse)   . Osteoporosis   . Palpitations   . RA (rheumatoid arthritis) ([place])   . Right ureteral stone   . Rosacea     Past Surgical History:  Procedure Laterality Date  . BACK SURGERY    . CATARACT EXTRACTION W/ INTRAOCULAR LENS  IMPLANT, BILATERAL  4 yrs ago   both eyes  . CHOLECYSTECTOMY  2005  . EXTRACORPOREAL SHOCK WAVE LITHOTRIPSY  09-24-10   and 1990  . Femur fx    . HIP ARTHROPLASTY Right 10/12/2012   Procedure: ARTHROPLASTY BIPOLAR HIP;  Surgeon: Gearlean Alf, MD;  Location: [facility];  Service: Orthopedics;  Laterality: Right;  . KNEE ARTHROSCOPY  04/17/2011, right   Procedure: ARTHROSCOPY KNEE;  Surgeon: Gearlean Alf;  Location: [place];  Service: Orthopedics;  Laterality: Right;  WITH LATERAL MENISCAL DEBRIDEMENT  . LUMBAR LAMINECTOMY/DECOMPRESSION MICRODISCECTOMY Right 12/28/2019   Procedure:  Right Lumbar Four-Lumbar Five Microdiscectomy;  Surgeon: Erline Levine, MD;  Location: [place];  Service: Neurosurgery;  Laterality: Right;  Right Lumbar Four-Lumbar Five Microdiscectomy  . MOLES EXCISED  2012   on nose  . Sternum Fx    . TOTAL KNEE ARTHROPLASTY  03/23/2012   Procedure: TOTAL KNEE ARTHROPLASTY;  Surgeon: Gearlean Alf, MD;  [facility]: [facility];  Service: Orthopedics;  Laterality: Right;  Marland Kitchen VAGINAL HYSTERECTOMY  1970   Leiomyomata    Current Outpatient Medications  Medication Sig Dispense Refill  . aspirin EC 81 MG tablet Take 81 mg by mouth daily. Swallow whole.    . cholecalciferol (VITAMIN D) 1000 units tablet Take 1,000 Units by mouth daily.     Marland Kitchen denosumab (PROLIA) 60 MG/ML SOLN injection Inject 60 mg into the skin every 6 (six) months. Administer in upper arm, thigh, or abdomen    . isosorbide mononitrate (IMDUR) 30 MG 24 hr tablet Take 0.5 tablets (15 mg total) by mouth daily. 45 tablet 2  . ketotifen (ALAWAY) 0.025 % ophthalmic solution Place 1 drop into both eyes 2 (two) times daily as needed (allergy eyes.).    Marland Kitchen levothyroxine (SYNTHROID, LEVOTHROID) 75 MCG tablet Take 75 mcg by mouth daily before breakfast.    . methocarbamol (ROBAXIN) 500 MG tablet Take 500 mg by mouth every 6 (six) hours as needed for muscle spasms.    Marland Kitchen omeprazole (PRILOSEC) 40 MG capsule Take 40 mg  by mouth 3 (three) times a week.     . traMADol (ULTRAM) 50 MG tablet Take 50 mg by mouth every 6 (six) hours as needed.    . vitamin E 400 UNIT capsule Take 400 Units by mouth daily.     . Zinc 50 MG TABS Take 50 mg by mouth daily.     No current facility-administered medications for this visit.   Allergies:  Contrast media [iodinated diagnostic agents], Levaquin [levofloxacin in d5w], Penicillins, and Ciprofloxacin   ROS:  No palpitations or syncope.  Physical Exam: VS:  BP 100/64   Pulse 64   Ht 5\' 5"  (1.651 m)   Wt 134 lb (60.8 kg)   BMI 22.30 kg/m , BMI Body mass index is 22.3  kg/m.  Wt Readings from Last 3 Encounters:  01/27/20 134 lb (60.8 kg)  12/30/19 132 lb 4.4 oz (60 kg)  12/24/19 134 lb (60.8 kg)    General: Elderly woman, appears comfortable at rest. HEENT: Conjunctiva and lids normal, wearing a mask. Neck: Supple, no elevated JVP or carotid bruits, no thyromegaly. Lungs: Clear to auscultation, nonlabored breathing at rest. Cardiac: Regular rate and rhythm, no S3, soft systolic murmur. Extremities: No pitting edema, distal pulses 2+.  ECG:  An ECG dated 12/30/2019 was personally reviewed today and demonstrated:  Sinus rhythm with prolonged PR interval, IVCD.  Recent Labwork: 12/30/2019: ALT 19; AST 23; BUN 21; Creatinine, Ser 0.80; Hemoglobin 13.1; Platelets 135; Potassium 4.1; Sodium 138   Other Studies Reviewed Today:  Echocardiogram 04/08/2019: 1. Left ventricular ejection fraction, by visual estimation, is 55 to  60%. The left ventricle has normal function. There is mildly increased  left ventricular hypertrophy.  2. Abnormal septal motion consistent with left bundle branch block.  3. Left ventricular diastolic parameters are consistent with Grade I  diastolic dysfunction (impaired relaxation).  4. Global right ventricle has normal systolic function.The right  ventricular size is normal. No increase in right ventricular wall  thickness.  5. Left atrial size was normal.  6. Right atrial size was normal.  7. Small pericardial effusion.  8. The pericardial effusion is anterior to the right ventricle and  surrounding the apex.  9. Mild aortic valve annular calcification.  10. The mitral valve is grossly normal. Trace mitral valve regurgitation.  11. The tricuspid valve is grossly normal. Tricuspid valve regurgitation  is trivial.  12. The aortic valve is tricuspid. Aortic valve regurgitation is not  visualized.  13. The pulmonic valve was grossly normal. Pulmonic valve regurgitation is  trivial.  14. TR signal is inadequate for  assessing pulmonary artery systolic  pressure.  15. The inferior vena cava is normal in size with greater than 50%  respiratory variability, suggesting right atrial pressure of 3 mmHg.   Lexiscan Myoview 04/02/2019:  Left bundle branch block present throughout with rare PACs and PVCs.  Small, mild intensity, mid to basal inferoseptal defect that is partially reversible, suggestive of variable soft tissue attenuation versus mild ischemic territory. Increased TID ratio looks to be due to misregistration rather than actual dilatation.  This is a low risk study.  Nuclear stress EF: 73%.  Chest x-ray 05/03/2019: FINDINGS: The heart size and mediastinal contours are within normal limits. No pneumothorax or pleural effusion is noted. Stable interstitial densities are noted throughout both lungs most consistent with scarring. No acute abnormality is noted. The visualized skeletal structures are unremarkable.  IMPRESSION: No active cardiopulmonary disease.  Assessment and Plan:  1.  Stable dyspnea on  exertion.  We continue medical therapy and observation at this point.  Echocardiogram and Myoview from November 2020 are noted above.  She underwent recent lumbar microdiscectomy on the right without obvious perioperative cardiac event.  Continue aspirin and Imdur.  Blood pressure and heart rate are normal without specific intervention.  2.  Left bundle branch block by ECG.  Medication Adjustments/Labs and Tests Ordered: Current medicines are reviewed at length with the patient today.  Concerns regarding medicines are outlined above.   Tests Ordered: No orders of the defined types were placed in this encounter.   Medication Changes: No orders of the defined types were placed in this encounter.   Disposition:  Follow up 6 months in the [place] office.  Signed, Satira Sark, MD, [facility] 01/27/2020 10:24 AM    [facility] [place], [place],  [place] [address] Phone: [phone]; Fax: [phone]

## 2020-01-27 ENCOUNTER — Encounter: Payer: Self-pay | Admitting: Cardiology

## 2020-01-27 ENCOUNTER — Other Ambulatory Visit: Payer: Self-pay

## 2020-01-27 ENCOUNTER — Ambulatory Visit (INDEPENDENT_AMBULATORY_CARE_PROVIDER_SITE_OTHER): Payer: Medicare Other | Admitting: Cardiology

## 2020-01-27 VITALS — BP 100/64 | HR 64 | Ht 65.0 in | Wt 134.0 lb

## 2020-01-27 DIAGNOSIS — I447 Left bundle-branch block, unspecified: Secondary | ICD-10-CM | POA: Diagnosis not present

## 2020-01-27 DIAGNOSIS — R0609 Other forms of dyspnea: Secondary | ICD-10-CM

## 2020-01-27 DIAGNOSIS — R002 Palpitations: Secondary | ICD-10-CM

## 2020-01-27 DIAGNOSIS — R0600 Dyspnea, unspecified: Secondary | ICD-10-CM | POA: Diagnosis not present

## 2020-01-27 NOTE — Patient Instructions (Addendum)

## 2020-02-14 ENCOUNTER — Encounter: Payer: Medicare Other | Admitting: Obstetrics and Gynecology

## 2020-02-14 ENCOUNTER — Other Ambulatory Visit: Payer: Medicare Other

## 2020-02-14 ENCOUNTER — Other Ambulatory Visit: Payer: Self-pay

## 2020-02-14 DIAGNOSIS — M810 Age-related osteoporosis without current pathological fracture: Secondary | ICD-10-CM

## 2020-02-15 LAB — CALCIUM: Calcium: 9.5 mg/dL (ref 8.6–10.4)

## 2020-02-22 DIAGNOSIS — M79675 Pain in left toe(s): Secondary | ICD-10-CM | POA: Diagnosis not present

## 2020-02-22 DIAGNOSIS — M2041 Other hammer toe(s) (acquired), right foot: Secondary | ICD-10-CM | POA: Diagnosis not present

## 2020-02-22 DIAGNOSIS — M79672 Pain in left foot: Secondary | ICD-10-CM | POA: Diagnosis not present

## 2020-02-22 DIAGNOSIS — M25775 Osteophyte, left foot: Secondary | ICD-10-CM | POA: Diagnosis not present

## 2020-02-22 DIAGNOSIS — M79671 Pain in right foot: Secondary | ICD-10-CM | POA: Diagnosis not present

## 2020-02-22 DIAGNOSIS — M79674 Pain in right toe(s): Secondary | ICD-10-CM | POA: Diagnosis not present

## 2020-02-25 ENCOUNTER — Ambulatory Visit (INDEPENDENT_AMBULATORY_CARE_PROVIDER_SITE_OTHER): Payer: Medicare Other | Admitting: Urology

## 2020-02-25 ENCOUNTER — Other Ambulatory Visit: Payer: Self-pay

## 2020-02-25 ENCOUNTER — Encounter: Payer: Self-pay | Admitting: Urology

## 2020-02-25 VITALS — BP 107/64 | HR 66 | Temp 97.7°F | Ht 65.0 in | Wt 135.0 lb

## 2020-02-25 DIAGNOSIS — N179 Acute kidney failure, unspecified: Secondary | ICD-10-CM | POA: Diagnosis not present

## 2020-02-25 DIAGNOSIS — N3941 Urge incontinence: Secondary | ICD-10-CM | POA: Diagnosis not present

## 2020-02-25 DIAGNOSIS — R351 Nocturia: Secondary | ICD-10-CM | POA: Diagnosis not present

## 2020-02-25 DIAGNOSIS — N200 Calculus of kidney: Secondary | ICD-10-CM

## 2020-02-25 DIAGNOSIS — R3915 Urgency of urination: Secondary | ICD-10-CM

## 2020-02-25 LAB — URINALYSIS, ROUTINE W REFLEX MICROSCOPIC
Bilirubin, UA: NEGATIVE
Glucose, UA: NEGATIVE
Ketones, UA: NEGATIVE
Nitrite, UA: NEGATIVE
Protein,UA: NEGATIVE
RBC, UA: NEGATIVE
Specific Gravity, UA: 1.015 (ref 1.005–1.030)
Urobilinogen, Ur: 0.2 mg/dL (ref 0.2–1.0)
pH, UA: 6.5 (ref 5.0–7.5)

## 2020-02-25 LAB — MICROSCOPIC EXAMINATION
RBC, Urine: NONE SEEN /hpf (ref 0–2)
Renal Epithel, UA: NONE SEEN /hpf

## 2020-02-25 MED ORDER — MIRABEGRON ER 50 MG PO TB24: 50.0000 mg | ORAL_TABLET | Freq: Every day | ORAL | 11 refills | Status: DC

## 2020-02-25 NOTE — Progress Notes (Signed)
Subjective:  1. Urge incontinence   2. Urgency of urination   3. Nocturia   4. Renal stones       Incontinence  HPI: Debra Richards is a 86 year-old female established patient who is here for evaluation of incontinence.  The patient states the nature of her problem(s) is incontinence.   Debra Richards returns today with the complaint of progressive incontinence over the last 3 years but it has been present for a decade. She has tried [place] and [employer] without success. She was given Myrbetriq samples and a script at her last visit and had done well with it.   She has had PTNS and Botox discussed but hasn't done either. She has marked frequency and urgency and will have UUI. She doesn't have SUI. She has nocturia with enuresis. She has no dysuria or hematuria. She has a history of stones with occasional mild right flank pain. She last passed a stone 4+ years ago. She has DDD with chronic back pain and is recovering from recent surgery by Dr. Vertell Limber. Her UA is ok.        ROS:  ROS:  A complete review of systems was performed.  All systems are negative except for pertinent findings as noted.   ROS  Allergies  Allergen Reactions  . Contrast Media [Iodinated Diagnostic Agents]     Arm swelled up and was painful after IVP years ago/   . Levaquin [Levofloxacin In D5w]     Made her sick all over  . Penicillins Hives    03/04/13: Pt has tolerated Amoxicillin/Unsyn without reaction  . Ciprofloxacin Rash    Outpatient Encounter Medications as of 02/25/2020  Medication Sig Note  . aspirin EC 81 MG tablet Take 81 mg by mouth daily. Swallow whole.   . cholecalciferol (VITAMIN D) 1000 units tablet Take 1,000 Units by mouth daily.    Marland Kitchen denosumab (PROLIA) 60 MG/ML SOLN injection Inject 60 mg into the skin every 6 (six) months. Administer in upper arm, thigh, or abdomen 12/15/2019: Next dose due:01/2020  . ketotifen (ALAWAY) 0.025 % ophthalmic solution Place 1 drop into both eyes 2 (two) times  daily as needed (allergy eyes.).   Marland Kitchen levothyroxine (SYNTHROID, LEVOTHROID) 75 MCG tablet Take 75 mcg by mouth daily before breakfast.   . methocarbamol (ROBAXIN) 500 MG tablet Take 500 mg by mouth every 6 (six) hours as needed for muscle spasms.   Marland Kitchen omeprazole (PRILOSEC) 40 MG capsule Take 40 mg by mouth 3 (three) times a week.    . traMADol (ULTRAM) 50 MG tablet Take 50 mg by mouth every 6 (six) hours as needed.   . vitamin E 400 UNIT capsule Take 400 Units by mouth daily.    . Zinc 50 MG TABS Take 50 mg by mouth daily.   . isosorbide mononitrate (IMDUR) 30 MG 24 hr tablet Take 0.5 tablets (15 mg total) by mouth daily.   . mirabegron ER (MYRBETRIQ) 50 MG TB24 tablet Take 1 tablet (50 mg total) by mouth daily.    No facility-administered encounter medications on file as of 02/25/2020.    Past Medical History:  Diagnosis Date  . Complication of anesthesia    Trouble waking up  . GERD (gastroesophageal reflux disease)   . History of kidney stones   . Hypothyroidism   . LBBB (left bundle branch block)   . MVP (mitral valve prolapse)   . Osteoporosis   . Palpitations   . RA (rheumatoid arthritis) ([place])   . Right  ureteral stone   . Rosacea     Past Surgical History:  Procedure Laterality Date  . BACK SURGERY    . CATARACT EXTRACTION W/ INTRAOCULAR LENS  IMPLANT, BILATERAL  4 yrs ago   both eyes  . CHOLECYSTECTOMY  2005  . EXTRACORPOREAL SHOCK WAVE LITHOTRIPSY  09-24-10   and 1990  . Femur fx    . HIP ARTHROPLASTY Right 10/12/2012   Procedure: ARTHROPLASTY BIPOLAR HIP;  Surgeon: Gearlean Alf, MD;  [facility];  Service: Orthopedics;  Laterality: Right;  . KNEE ARTHROSCOPY  04/17/2011, right   Procedure: ARTHROSCOPY KNEE;  Surgeon: Gearlean Alf;  Location: [place];  Service: Orthopedics;  Laterality: Right;  WITH LATERAL MENISCAL DEBRIDEMENT  . LUMBAR LAMINECTOMY/DECOMPRESSION MICRODISCECTOMY Right 12/28/2019   Procedure: Right Lumbar Four-Lumbar Five  Microdiscectomy;  Surgeon: Erline Levine, MD;  Location: [place];  Service: Neurosurgery;  Laterality: Right;  Right Lumbar Four-Lumbar Five Microdiscectomy  . MOLES EXCISED  2012   on nose  . Sternum Fx    . TOTAL KNEE ARTHROPLASTY  03/23/2012   Procedure: TOTAL KNEE ARTHROPLASTY;  Surgeon: Gearlean Alf, MD;  [facility];  Service: Orthopedics;  Laterality: Right;  Marland Kitchen VAGINAL HYSTERECTOMY  1970   Leiomyomata    Social History   Socioeconomic History  . Marital status: Widowed    Spouse name: Not on file  . Number of children: Not on file  . Years of education: Not on file  . Highest education level: Not on file  Occupational History  . Not on file  Tobacco Use  . Smoking status: Never Smoker  . Smokeless tobacco: Never Used  Vaping Use  . Vaping Use: Never used  Substance and Sexual Activity  . Alcohol use: No    Alcohol/week: 0.0 standard drinks  . Drug use: No  . Sexual activity: Not Currently    Birth control/protection: Post-menopausal, Surgical    Comment: 1st intercourse 58 yo-2 partners  Other Topics Concern  . Not on file  Social History Narrative   Lives in [place].   Lives c 2nd husband    NOK-Debra Richards   Social Determinants of Health   Financial Resource Strain:   . Difficulty of Paying Living Expenses: Not on file  Food Insecurity:   . Worried About Charity fundraiser in the Last Year: Not on file  . Ran Out of Food in the Last Year: Not on file  Transportation Needs:   . Lack of Transportation (Medical): Not on file  . Lack of Transportation (Non-Medical): Not on file  Physical Activity:   . Days of Exercise per Week: Not on file  . Minutes of Exercise per Session: Not on file  Stress:   . Feeling of Stress : Not on file  Social Connections:   . Frequency of Communication with Friends and Family: Not on file  . Frequency of Social Gatherings with Friends and Family: Not on file  . Attends Religious Services: Not on file  . Active Member of  Clubs or Organizations: Not on file  . Attends Archivist Meetings: Not on file  . Marital Status: Not on file  Intimate Partner Violence:   . Fear of Current or Ex-Partner: Not on file  . Emotionally Abused: Not on file  . Physically Abused: Not on file  . Sexually Abused: Not on file    Family History  Problem Relation Age of Onset  . Cancer Mother  COLON  . Heart disease Father        STROKE  . Stroke Father   . Ovarian cancer Maternal Grandmother   . Crohn's disease Daughter        Objective: Vitals:   02/25/20 1123  BP: 107/64  Pulse: 66  Temp: 97.7 F (36.5 C)     Physical Exam  Lab Results:  Results for orders placed or performed in visit on 02/25/20 (from the past 24 hour(s))  Urinalysis, Routine w reflex microscopic     Status: Abnormal   Collection Time: 02/25/20 11:34 AM  Result Value Ref Range   Specific Gravity, UA 1.015 1.005 - 1.030   pH, UA 6.5 5.0 - 7.5   Color, UA Yellow Yellow   Appearance Ur Clear Clear   Leukocytes,UA Trace (A) Negative   Protein,UA Negative Negative/Trace   Glucose, UA Negative Negative   Ketones, UA Negative Negative   RBC, UA Negative Negative   Bilirubin, UA Negative Negative   Urobilinogen, Ur 0.2 0.2 - 1.0 mg/dL   Nitrite, UA Negative Negative   Microscopic Examination See below:    Narrative   Performed at:  [facility] [address] [place], [place]  [id number] Lab Director: Mina Marble MT, Phone:  [phone]  Microscopic Examination     Status: Abnormal   Collection Time: 02/25/20 11:34 AM   Urine  Result Value Ref Range   WBC, UA 0-5 0 - 5 /hpf   RBC None seen 0 - 2 /hpf   Epithelial Cells (non renal) 0-10 0 - 10 /hpf   Renal Epithel, UA None seen None seen /hpf   Bacteria, UA Few (A) None seen/Few   Narrative   Performed at:  [facility] [address], [place], [place]  [id number] Lab Director: [place], Phone:  [phone]     BMET   Cr was 0.80 on 12/30/19    Studies/Results:  Lumbar spine series from 1/21 shows a stable LMP stone.  She had RUP stones on a CT in 2017 but I don't clearly see them on the lumbar spine films.   Assessment & Plan: OAB wet.   I have given her samples and a script for Myrbetriq 50mg .  She will return in 58mo.  Kidney stones.  She has a stable LMP stone and probably has RUP stones but has no hematuria or pain.    Meds ordered this encounter  Medications  . mirabegron ER (MYRBETRIQ) 50 MG TB24 tablet    Sig: Take 1 tablet (50 mg total) by mouth daily.    Dispense:  30 tablet    Refill:  11     Orders Placed This Encounter  Procedures  . Microscopic Examination  . Urinalysis, Routine w reflex microscopic      Return in about 3 months (around 05/27/2020).   CC: Glenda Chroman, MD      Irine Seal 02/25/2020

## 2020-02-25 NOTE — Progress Notes (Signed)
Urological Symptom Review  Patient is experiencing the following symptoms: Get up at night to urinate Leakage of urine   Review of Systems  Gastrointestinal (upper)  : Negative for upper GI symptoms  Gastrointestinal (lower) : Negative for lower GI symptoms  Constitutional : Negative for symptoms  Skin: Negative for skin symptoms  Eyes: Negative for eye symptoms  Ear/Nose/Throat : Negative for Ear/Nose/Throat symptoms  Hematologic/Lymphatic: Easy bruising  Cardiovascular : Negative for cardiovascular symptoms  Respiratory : Shortness of breath  Endocrine: Negative for endocrine symptoms  Musculoskeletal: Back pain Joint pain  Neurological: Negative for neurological symptoms  Psychologic: Negative for psychiatric symptoms

## 2020-03-01 ENCOUNTER — Ambulatory Visit: Payer: Medicare Other | Admitting: Cardiology

## 2020-03-01 DIAGNOSIS — F320 Major depressive disorder, single episode, mild: Secondary | ICD-10-CM | POA: Diagnosis not present

## 2020-03-01 DIAGNOSIS — M545 Low back pain, unspecified: Secondary | ICD-10-CM | POA: Diagnosis not present

## 2020-03-01 DIAGNOSIS — M25512 Pain in left shoulder: Secondary | ICD-10-CM | POA: Diagnosis not present

## 2020-03-01 DIAGNOSIS — Z299 Encounter for prophylactic measures, unspecified: Secondary | ICD-10-CM | POA: Diagnosis not present

## 2020-03-01 DIAGNOSIS — N183 Chronic kidney disease, stage 3 unspecified: Secondary | ICD-10-CM | POA: Diagnosis not present

## 2020-03-20 DIAGNOSIS — Z961 Presence of intraocular lens: Secondary | ICD-10-CM | POA: Diagnosis not present

## 2020-03-20 DIAGNOSIS — H40013 Open angle with borderline findings, low risk, bilateral: Secondary | ICD-10-CM | POA: Diagnosis not present

## 2020-03-20 DIAGNOSIS — H43812 Vitreous degeneration, left eye: Secondary | ICD-10-CM | POA: Diagnosis not present

## 2020-03-21 DIAGNOSIS — M7661 Achilles tendinitis, right leg: Secondary | ICD-10-CM | POA: Diagnosis not present

## 2020-03-21 DIAGNOSIS — M2041 Other hammer toe(s) (acquired), right foot: Secondary | ICD-10-CM | POA: Diagnosis not present

## 2020-03-21 DIAGNOSIS — M79671 Pain in right foot: Secondary | ICD-10-CM | POA: Diagnosis not present

## 2020-03-21 DIAGNOSIS — M79674 Pain in right toe(s): Secondary | ICD-10-CM | POA: Diagnosis not present

## 2020-03-27 DIAGNOSIS — D225 Melanocytic nevi of trunk: Secondary | ICD-10-CM | POA: Diagnosis not present

## 2020-03-27 DIAGNOSIS — L570 Actinic keratosis: Secondary | ICD-10-CM | POA: Diagnosis not present

## 2020-03-27 DIAGNOSIS — L820 Inflamed seborrheic keratosis: Secondary | ICD-10-CM | POA: Diagnosis not present

## 2020-03-27 DIAGNOSIS — X32XXXA Exposure to sunlight, initial encounter: Secondary | ICD-10-CM | POA: Diagnosis not present

## 2020-04-25 DIAGNOSIS — M79675 Pain in left toe(s): Secondary | ICD-10-CM | POA: Diagnosis not present

## 2020-04-25 DIAGNOSIS — M25579 Pain in unspecified ankle and joints of unspecified foot: Secondary | ICD-10-CM | POA: Diagnosis not present

## 2020-04-25 DIAGNOSIS — M79672 Pain in left foot: Secondary | ICD-10-CM | POA: Diagnosis not present

## 2020-04-25 DIAGNOSIS — M2042 Other hammer toe(s) (acquired), left foot: Secondary | ICD-10-CM | POA: Diagnosis not present

## 2020-05-08 ENCOUNTER — Other Ambulatory Visit (HOSPITAL_COMMUNITY): Payer: Self-pay | Admitting: Neurosurgery

## 2020-05-08 DIAGNOSIS — M5416 Radiculopathy, lumbar region: Secondary | ICD-10-CM | POA: Diagnosis not present

## 2020-05-08 DIAGNOSIS — M47816 Spondylosis without myelopathy or radiculopathy, lumbar region: Secondary | ICD-10-CM | POA: Diagnosis not present

## 2020-05-08 DIAGNOSIS — M5136 Other intervertebral disc degeneration, lumbar region: Secondary | ICD-10-CM | POA: Diagnosis not present

## 2020-05-08 DIAGNOSIS — M4319 Spondylolisthesis, multiple sites in spine: Secondary | ICD-10-CM | POA: Diagnosis not present

## 2020-05-08 DIAGNOSIS — M549 Dorsalgia, unspecified: Secondary | ICD-10-CM | POA: Diagnosis not present

## 2020-05-30 ENCOUNTER — Other Ambulatory Visit: Payer: Self-pay

## 2020-05-30 ENCOUNTER — Other Ambulatory Visit (HOSPITAL_COMMUNITY): Payer: Self-pay | Admitting: Neurosurgery

## 2020-05-30 ENCOUNTER — Ambulatory Visit (HOSPITAL_COMMUNITY)
Admission: RE | Admit: 2020-05-30 | Discharge: 2020-05-30 | Disposition: A | Discharge status: Home / Self Care | Payer: Medicare Other | Source: Ambulatory Visit | Attending: Neurosurgery | Admitting: Neurosurgery

## 2020-05-30 DIAGNOSIS — M5416 Radiculopathy, lumbar region: Secondary | ICD-10-CM

## 2020-05-30 DIAGNOSIS — M545 Low back pain, unspecified: Secondary | ICD-10-CM | POA: Diagnosis not present

## 2020-05-30 MED ORDER — GADOBUTROL 1 MMOL/ML IV SOLN: 5.0000 mL | Freq: Once | INTRAVENOUS | Status: DC | PRN

## 2020-06-07 NOTE — Telephone Encounter (Signed)
Put in by error.  

## 2020-06-20 NOTE — Progress Notes (Deleted)
Subjective:  No diagnosis found.    Incontinence  HPI: Debra Richards is a 87 year-old female established patient who is here for evaluation of incontinence.  The patient states the nature of her problem(s) is incontinence.   Debra Richards returns today with the complaint of progressive incontinence over the last 3 years but it has been present for a decade. She has tried [place] and [employer] without success. She was given Myrbetriq samples and a script at her last visit and had done well with it.   She has had PTNS and Botox discussed but hasn't done either. She has marked frequency and urgency and will have UUI. She doesn't have SUI. She has nocturia with enuresis. She has no dysuria or hematuria. She has a history of stones with occasional mild right flank pain. She last passed a stone 4+ years ago. She has DDD with chronic back pain and is recovering from recent surgery by Dr. Vertell Limber. Her UA is ok.        ROS:  ROS:  A complete review of systems was performed.  All systems are negative except for pertinent findings as noted.   ROS  Allergies  Allergen Reactions  . Contrast Media [Iodinated Diagnostic Agents]     Arm swelled up and was painful after IVP years ago/   . Levaquin [Levofloxacin In D5w]     Made her sick all over  . Penicillins Hives    03/04/13: Pt has tolerated Amoxicillin/Unsyn without reaction  . Ciprofloxacin Rash    Outpatient Encounter Medications as of 06/22/2020  Medication Sig Note  . aspirin EC 81 MG tablet Take 81 mg by mouth daily. Swallow whole.   . cholecalciferol (VITAMIN D) 1000 units tablet Take 1,000 Units by mouth daily.    Marland Kitchen denosumab (PROLIA) 60 MG/ML SOLN injection Inject 60 mg into the skin every 6 (six) months. Administer in upper arm, thigh, or abdomen 12/15/2019: Next dose due:01/2020  . isosorbide mononitrate (IMDUR) 30 MG 24 hr tablet Take 0.5 tablets (15 mg total) by mouth daily.   Marland Kitchen ketotifen (ALAWAY) 0.025 % ophthalmic solution Place 1  drop into both eyes 2 (two) times daily as needed (allergy eyes.).   Marland Kitchen levothyroxine (SYNTHROID, LEVOTHROID) 75 MCG tablet Take 75 mcg by mouth daily before breakfast.   . methocarbamol (ROBAXIN) 500 MG tablet Take 500 mg by mouth every 6 (six) hours as needed for muscle spasms.   . mirabegron ER (MYRBETRIQ) 50 MG TB24 tablet Take 1 tablet (50 mg total) by mouth daily.   Marland Kitchen omeprazole (PRILOSEC) 40 MG capsule Take 40 mg by mouth 3 (three) times a week.    . traMADol (ULTRAM) 50 MG tablet Take 50 mg by mouth every 6 (six) hours as needed.   . vitamin E 400 UNIT capsule Take 400 Units by mouth daily.    . Zinc 50 MG TABS Take 50 mg by mouth daily.    No facility-administered encounter medications on file as of 06/22/2020.    Past Medical History:  Diagnosis Date  . Complication of anesthesia    Trouble waking up  . GERD (gastroesophageal reflux disease)   . History of kidney stones   . Hypothyroidism   . LBBB (left bundle branch block)   . MVP (mitral valve prolapse)   . Osteoporosis   . Palpitations   . RA (rheumatoid arthritis) ([place])   . Right ureteral stone   . Rosacea     Past Surgical History:  Procedure Laterality Date  .  BACK SURGERY    . CATARACT EXTRACTION W/ INTRAOCULAR LENS  IMPLANT, BILATERAL  4 yrs ago   both eyes  . CHOLECYSTECTOMY  2005  . EXTRACORPOREAL SHOCK WAVE LITHOTRIPSY  09-24-10   and 1990  . Femur fx    . HIP ARTHROPLASTY Right 10/12/2012   Procedure: ARTHROPLASTY BIPOLAR HIP;  Surgeon: Gearlean Alf, MD;  [facility];  Service: Orthopedics;  Laterality: Right;  . KNEE ARTHROSCOPY  04/17/2011, right   Procedure: ARTHROSCOPY KNEE;  Surgeon: Gearlean Alf;  Location: [facility];  Service: Orthopedics;  Laterality: Right;  WITH LATERAL MENISCAL DEBRIDEMENT  . LUMBAR LAMINECTOMY/DECOMPRESSION MICRODISCECTOMY Right 12/28/2019   Procedure: Right Lumbar Four-Lumbar Five Microdiscectomy;  Surgeon: Erline Levine, MD;  Location: [place];   Service: Neurosurgery;  Laterality: Right;  Right Lumbar Four-Lumbar Five Microdiscectomy  . MOLES EXCISED  2012   on nose  . Sternum Fx    . TOTAL KNEE ARTHROPLASTY  03/23/2012   Procedure: TOTAL KNEE ARTHROPLASTY;  Surgeon: Gearlean Alf, MD;  [facility];  Service: Orthopedics;  Laterality: Right;  Marland Kitchen VAGINAL HYSTERECTOMY  1970   Leiomyomata    Social History   Socioeconomic History  . Marital status: Widowed    Spouse name: Not on file  . Number of children: Not on file  . Years of education: Not on file  . Highest education level: Not on file  Occupational History  . Not on file  Tobacco Use  . Smoking status: Never Smoker  . Smokeless tobacco: Never Used  Vaping Use  . Vaping Use: Never used  Substance and Sexual Activity  . Alcohol use: No    Alcohol/week: 0.0 standard drinks  . Drug use: No  . Sexual activity: Not Currently    Birth control/protection: Post-menopausal, Surgical    Comment: 1st intercourse 54 yo-2 partners  Other Topics Concern  . Not on file  Social History Narrative   Lives in [place].   Lives c 2nd husband    NOK-Debra Richards   Social Determinants of Health   Financial Resource Strain: Not on file  Food Insecurity: Not on file  Transportation Needs: Not on file  Physical Activity: Not on file  Stress: Not on file  Social Connections: Not on file  Intimate Partner Violence: Not on file    Family History  Problem Relation Age of Onset  . Cancer Mother        COLON  . Heart disease Father        STROKE  . Stroke Father   . Ovarian cancer Maternal Grandmother   . Crohn's disease Daughter        Objective: There were no vitals filed for this visit.   Physical Exam  Lab Results:  No results found for this or any previous visit (from the past 24 hour(s)).  BMET   Cr was 0.80 on 12/30/19    Studies/Results:  Lumbar spine series from 1/21 shows a stable LMP stone.  She had RUP stones on a CT in 2017 but I don't clearly  see them on the lumbar spine films.   Assessment & Plan: OAB wet.   I have given her samples and a script for Myrbetriq 50mg .  She will return in 38mo.  Kidney stones.  She has a stable LMP stone and probably has RUP stones but has no hematuria or pain.    No orders of the defined types were placed in this encounter.  No orders of the defined types were placed in this encounter.     No follow-ups on file.   CC: Glenda Chroman, MD      Irine Seal 06/20/2020

## 2020-06-21 DIAGNOSIS — R42 Dizziness and giddiness: Secondary | ICD-10-CM | POA: Diagnosis not present

## 2020-06-21 DIAGNOSIS — M545 Low back pain, unspecified: Secondary | ICD-10-CM | POA: Diagnosis not present

## 2020-06-21 DIAGNOSIS — E039 Hypothyroidism, unspecified: Secondary | ICD-10-CM | POA: Diagnosis not present

## 2020-06-21 DIAGNOSIS — M1710 Unilateral primary osteoarthritis, unspecified knee: Secondary | ICD-10-CM | POA: Diagnosis not present

## 2020-06-21 DIAGNOSIS — Z299 Encounter for prophylactic measures, unspecified: Secondary | ICD-10-CM | POA: Diagnosis not present

## 2020-06-22 ENCOUNTER — Ambulatory Visit: Payer: Medicare Other | Admitting: Urology

## 2020-06-23 ENCOUNTER — Ambulatory Visit: Payer: Medicare Other | Admitting: Urology

## 2020-06-23 DIAGNOSIS — M4319 Spondylolisthesis, multiple sites in spine: Secondary | ICD-10-CM | POA: Diagnosis not present

## 2020-06-23 DIAGNOSIS — M5416 Radiculopathy, lumbar region: Secondary | ICD-10-CM | POA: Diagnosis not present

## 2020-06-23 DIAGNOSIS — M48062 Spinal stenosis, lumbar region with neurogenic claudication: Secondary | ICD-10-CM | POA: Diagnosis not present

## 2020-06-23 DIAGNOSIS — M47816 Spondylosis without myelopathy or radiculopathy, lumbar region: Secondary | ICD-10-CM | POA: Diagnosis not present

## 2020-06-23 DIAGNOSIS — M5136 Other intervertebral disc degeneration, lumbar region: Secondary | ICD-10-CM | POA: Diagnosis not present

## 2020-06-23 DIAGNOSIS — M549 Dorsalgia, unspecified: Secondary | ICD-10-CM | POA: Diagnosis not present

## 2020-06-23 DIAGNOSIS — M5126 Other intervertebral disc displacement, lumbar region: Secondary | ICD-10-CM | POA: Diagnosis not present

## 2020-07-11 DIAGNOSIS — M5416 Radiculopathy, lumbar region: Secondary | ICD-10-CM | POA: Diagnosis not present

## 2020-07-11 NOTE — Telephone Encounter (Signed)
Will send letter out regarding Prolia. Pt had previous appointments canceled. Will set up Prolia when patient wants to start it back up again

## 2020-07-11 NOTE — Progress Notes (Signed)
Subjective:  1. Urge incontinence   2. Nocturia   3. Renal stones      HPI: Debra Richards is a 87 year-old female established patient who is here for follow up of incontinence.   The patient states the nature of her problem(s) is incontinence.   Debra Richards returns today with the complaint of progressive incontinence over the last 3 years but it has been present for a decade. She has tried [place] and [employer] without success. She remains Myrbetriq 50mg  which helps but she still has nocturia x 4-5x.  She has urgency with UUI.  She has had no flank pain or hematuria and her UA is ok today.    She has had PTNS and Botox discussed but hasn't done either. She has marked frequency and urgency and will have UUI. She doesn't have SUI. She has nocturia with enuresis. She has no dysuria or hematuria. She has a history of stones with occasional mild right flank pain. She last passed a stone 4+ years ago. She has DDD with chronic back pain and is recovering from recent surgery by Dr. Vertell Limber. Her UA is ok.        ROS:  ROS:  A complete review of systems was performed.  All systems are negative except for pertinent findings as noted.   ROS  Allergies  Allergen Reactions  . Contrast Media [Iodinated Diagnostic Agents]     Arm swelled up and was painful after IVP years ago/   . Levaquin [Levofloxacin In D5w]     Made her sick all over  . Penicillins Hives    03/04/13: Pt has tolerated Amoxicillin/Unsyn without reaction  . Ciprofloxacin Rash    Outpatient Encounter Medications as of 07/13/2020  Medication Sig Note  . aspirin EC 81 MG tablet Take 81 mg by mouth daily. Swallow whole.   . cholecalciferol (VITAMIN D) 1000 units tablet Take 1,000 Units by mouth daily.    Marland Kitchen denosumab (PROLIA) 60 MG/ML SOLN injection Inject 60 mg into the skin every 6 (six) months. Administer in upper arm, thigh, or abdomen 12/15/2019: Next dose due:01/2020  . ketotifen (ZADITOR) 0.025 % ophthalmic solution Place 1  drop into both eyes 2 (two) times daily as needed (allergy eyes.).   Marland Kitchen levothyroxine (SYNTHROID, LEVOTHROID) 75 MCG tablet Take 75 mcg by mouth daily before breakfast.   . methocarbamol (ROBAXIN) 500 MG tablet Take 500 mg by mouth every 6 (six) hours as needed for muscle spasms.   . mirabegron ER (MYRBETRIQ) 50 MG TB24 tablet Take 1 tablet (50 mg total) by mouth daily.   Marland Kitchen omeprazole (PRILOSEC) 40 MG capsule Take 40 mg by mouth 3 (three) times a week.    . traMADol (ULTRAM) 50 MG tablet Take 50 mg by mouth every 6 (six) hours as needed.   . vitamin E 400 UNIT capsule Take 400 Units by mouth daily.    . Zinc 50 MG TABS Take 50 mg by mouth daily.   Marland Kitchen gabapentin (NEURONTIN) 100 MG capsule Take 100 mg by mouth 3 (three) times daily.   . isosorbide mononitrate (IMDUR) 30 MG 24 hr tablet Take 0.5 tablets (15 mg total) by mouth daily.   . meclizine (ANTIVERT) 25 MG tablet Take 25 mg by mouth 2 (two) times daily as needed.    No facility-administered encounter medications on file as of 07/13/2020.    Past Medical History:  Diagnosis Date  . Complication of anesthesia    Trouble waking up  . GERD (gastroesophageal reflux  disease)   . History of kidney stones   . Hypothyroidism   . LBBB (left bundle branch block)   . MVP (mitral valve prolapse)   . Osteoporosis   . Palpitations   . RA (rheumatoid arthritis) ([place])   . Right ureteral stone   . Rosacea     Past Surgical History:  Procedure Laterality Date  . BACK SURGERY    . CATARACT EXTRACTION W/ INTRAOCULAR LENS  IMPLANT, BILATERAL  4 yrs ago   both eyes  . CHOLECYSTECTOMY  2005  . EXTRACORPOREAL SHOCK WAVE LITHOTRIPSY  09-24-10   and 1990  . Femur fx    . HIP ARTHROPLASTY Right 10/12/2012   Procedure: ARTHROPLASTY BIPOLAR HIP;  Surgeon: Gearlean Alf, MD;  [facility];  Service: Orthopedics;  Laterality: Right;  . KNEE ARTHROSCOPY  04/17/2011, right   Procedure: ARTHROSCOPY KNEE;  Surgeon: Gearlean Alf;  Location: [facility];  Service: Orthopedics;  Laterality: Right;  WITH LATERAL MENISCAL DEBRIDEMENT  . LUMBAR LAMINECTOMY/DECOMPRESSION MICRODISCECTOMY Right 12/28/2019   Procedure: Right Lumbar Four-Lumbar Five Microdiscectomy;  Surgeon: Erline Levine, MD;  Location: [place];  Service: Neurosurgery;  Laterality: Right;  Right Lumbar Four-Lumbar Five Microdiscectomy  . MOLES EXCISED  2012   on nose  . Sternum Fx    . TOTAL KNEE ARTHROPLASTY  03/23/2012   Procedure: TOTAL KNEE ARTHROPLASTY;  Surgeon: Gearlean Alf, MD;  [facility];  Service: Orthopedics;  Laterality: Right;  Marland Kitchen VAGINAL HYSTERECTOMY  1970   Leiomyomata    Social History   Socioeconomic History  . Marital status: Widowed    Spouse name: Not on file  . Number of children: Not on file  . Years of education: Not on file  . Highest education level: Not on file  Occupational History  . Not on file  Tobacco Use  . Smoking status: Never Smoker  . Smokeless tobacco: Never Used  Vaping Use  . Vaping Use: Never used  Substance and Sexual Activity  . Alcohol use: No    Alcohol/week: 0.0 standard drinks  . Drug use: No  . Sexual activity: Not Currently    Birth control/protection: Post-menopausal, Surgical    Comment: 1st intercourse 51 yo-2 partners  Other Topics Concern  . Not on file  Social History Narrative   Lives in [place].   Lives c 2nd husband    NOK-Gina Axson   Social Determinants of Health   Financial Resource Strain: Not on file  Food Insecurity: Not on file  Transportation Needs: Not on file  Physical Activity: Not on file  Stress: Not on file  Social Connections: Not on file  Intimate Partner Violence: Not on file    Family History  Problem Relation Age of Onset  . Cancer Mother        COLON  . Heart disease Father        STROKE  . Stroke Father   . Ovarian cancer Maternal Grandmother   . Crohn's disease Daughter        Objective: Vitals:   07/13/20 1455  BP: 118/72  Pulse: 76   Temp: 98.3 F (36.8 C)     Physical Exam  Lab Results:  No results found for this or any previous visit (from the past 24 hour(s)).  BMET   Cr was 0.80 on 12/30/19  UA has 6-10 WBC and mod bacteria. Results for orders placed or performed in visit on 07/13/20 (from the past 24 hour(s))  Urinalysis, Routine w reflex microscopic     Status: Abnormal   Collection Time: 07/13/20  2:47 PM  Result Value Ref Range   Specific Gravity, UA 1.015 1.005 - 1.030   pH, UA 6.5 5.0 - 7.5   Color, UA Yellow Yellow   Appearance Ur Clear Clear   Leukocytes,UA 1+ (A) Negative   Protein,UA Negative Negative/Trace   Glucose, UA Negative Negative   Ketones, UA Negative Negative   RBC, UA Negative Negative   Bilirubin, UA Negative Negative   Urobilinogen, Ur 0.2 0.2 - 1.0 mg/dL   Nitrite, UA Negative Negative   Microscopic Examination See below:    Narrative   Performed at:  [facility] [address] [place], [place]  [id number] Lab Director: Mina Marble [place], Phone:  [phone]  Microscopic Examination     Status: Abnormal   Collection Time: 07/13/20  2:47 PM   Urine  Result Value Ref Range   WBC, UA 6-10 (A) 0 - 5 /hpf   RBC None seen 0 - 2 /hpf   Epithelial Cells (non renal) 0-10 0 - 10 /hpf   Renal Epithel, UA None seen None seen /hpf   Bacteria, UA Moderate (A) None seen/Few   Narrative   Performed at:  [facility] [address] [place], [place]  [id number] Lab Director: [place], Phone:  [phone]    Studies/Results:    Assessment & Plan: OAB wet.   I have given her more samples of  Myrbetriq 50mg .  She will return in 55mo.  Kidney stones.   I will get a KUB in 3 months.   No orders of the defined types were placed in this encounter.    Orders Placed This Encounter  Procedures  . DG Abd 1 View    Standing Status:   Future    Standing Expiration Date:   11/10/2020    Order Specific Question:   Reason for Exam (SYMPTOM  OR  DIAGNOSIS REQUIRED)    Answer:   kidney stones    Order Specific Question:   Preferred imaging location?    Answer:   [facility]    Order Specific Question:   Radiology Contrast Protocol - do NOT remove file path    Answer:   [email or web address]  . Urinalysis, Routine w reflex microscopic      Return in about 3 months (around 10/10/2020) for With KUB.   CC: Glenda Chroman, MD      Irine Seal 07/13/2020

## 2020-07-13 ENCOUNTER — Other Ambulatory Visit: Payer: Self-pay

## 2020-07-13 ENCOUNTER — Encounter: Payer: Self-pay | Admitting: Urology

## 2020-07-13 ENCOUNTER — Ambulatory Visit (INDEPENDENT_AMBULATORY_CARE_PROVIDER_SITE_OTHER): Payer: Medicare Other | Admitting: Urology

## 2020-07-13 VITALS — BP 118/72 | HR 76 | Temp 98.3°F | Ht 65.0 in | Wt 135.0 lb

## 2020-07-13 DIAGNOSIS — R351 Nocturia: Secondary | ICD-10-CM

## 2020-07-13 DIAGNOSIS — N200 Calculus of kidney: Secondary | ICD-10-CM

## 2020-07-13 DIAGNOSIS — N3941 Urge incontinence: Secondary | ICD-10-CM

## 2020-07-13 LAB — URINALYSIS, ROUTINE W REFLEX MICROSCOPIC
Bilirubin, UA: NEGATIVE
Glucose, UA: NEGATIVE
Ketones, UA: NEGATIVE
Nitrite, UA: NEGATIVE
Protein,UA: NEGATIVE
RBC, UA: NEGATIVE
Specific Gravity, UA: 1.015 (ref 1.005–1.030)
Urobilinogen, Ur: 0.2 mg/dL (ref 0.2–1.0)
pH, UA: 6.5 (ref 5.0–7.5)

## 2020-07-13 LAB — MICROSCOPIC EXAMINATION
RBC, Urine: NONE SEEN /hpf (ref 0–2)
Renal Epithel, UA: NONE SEEN /hpf

## 2020-07-13 NOTE — Progress Notes (Signed)
Urological Symptom Review  Patient is experiencing the following symptoms: Frequent urination Hard to postpone urination Get up at night to urinate Leakage of urine   Review of Systems  Gastrointestinal (upper)  : Negative for upper GI symptoms  Gastrointestinal (lower) : Negative for lower GI symptoms  Constitutional : Negative for symptoms  Skin: Negative for skin symptoms  Eyes: Negative for eye symptoms  Ear/Nose/Throat : Negative for Ear/Nose/Throat symptoms  Hematologic/Lymphatic: Negative for Hematologic/Lymphatic symptoms  Cardiovascular : Leg swelling  Respiratory : Shortness of breath  Endocrine: Negative for endocrine symptoms  Musculoskeletal: Back pain Joint pain  Neurological: Negative for neurological symptoms  Psychologic: Negative for psychiatric symptoms

## 2020-07-17 DIAGNOSIS — M79671 Pain in right foot: Secondary | ICD-10-CM | POA: Diagnosis not present

## 2020-07-17 DIAGNOSIS — L110 Acquired keratosis follicularis: Secondary | ICD-10-CM | POA: Diagnosis not present

## 2020-07-17 DIAGNOSIS — M79672 Pain in left foot: Secondary | ICD-10-CM | POA: Diagnosis not present

## 2020-07-17 DIAGNOSIS — M79674 Pain in right toe(s): Secondary | ICD-10-CM | POA: Diagnosis not present

## 2020-07-17 DIAGNOSIS — I739 Peripheral vascular disease, unspecified: Secondary | ICD-10-CM | POA: Diagnosis not present

## 2020-07-17 DIAGNOSIS — M79675 Pain in left toe(s): Secondary | ICD-10-CM | POA: Diagnosis not present

## 2020-08-04 ENCOUNTER — Ambulatory Visit: Payer: Medicare Other | Admitting: Cardiology

## 2020-08-07 DIAGNOSIS — M5126 Other intervertebral disc displacement, lumbar region: Secondary | ICD-10-CM | POA: Diagnosis not present

## 2020-08-07 DIAGNOSIS — M549 Dorsalgia, unspecified: Secondary | ICD-10-CM | POA: Diagnosis not present

## 2020-08-07 DIAGNOSIS — M5416 Radiculopathy, lumbar region: Secondary | ICD-10-CM | POA: Diagnosis not present

## 2020-08-07 DIAGNOSIS — M48062 Spinal stenosis, lumbar region with neurogenic claudication: Secondary | ICD-10-CM | POA: Diagnosis not present

## 2020-08-22 DIAGNOSIS — M48062 Spinal stenosis, lumbar region with neurogenic claudication: Secondary | ICD-10-CM | POA: Diagnosis not present

## 2020-08-29 NOTE — Progress Notes (Signed)
Cardiology Office Note  Date: 08/30/2020   ID: Debra Richards, DOB 01/13/1926, MRN [id number]  PCP:  Glenda Chroman, MD  Cardiologist:  Rozann Lesches, MD Electrophysiologist:  None   Chief Complaint  Patient presents with  . Cardiac follow-up    History of Present Illness: Debra Richards is a 87 y.o. female last seen in September 2021.  She presents for a routine follow-up visit.  No change in chronic dyspnea on exertion.  He does not report any chest pain or palpitations, no syncope.  I reviewed her medications which are outlined below.  No spontaneous bleeding problems on aspirin.  She continues to follow with Dr. Woody Seller.  Past Medical History:  Diagnosis Date  . Complication of anesthesia    Trouble waking up  . GERD (gastroesophageal reflux disease)   . History of kidney stones   . Hypothyroidism   . LBBB (left bundle branch block)   . MVP (mitral valve prolapse)   . Osteoporosis   . Palpitations   . RA (rheumatoid arthritis) ([place])   . Right ureteral stone   . Rosacea     Past Surgical History:  Procedure Laterality Date  . BACK SURGERY    . CATARACT EXTRACTION W/ INTRAOCULAR LENS  IMPLANT, BILATERAL  4 yrs ago   both eyes  . CHOLECYSTECTOMY  2005  . EXTRACORPOREAL SHOCK WAVE LITHOTRIPSY  09-24-10   and 1990  . Femur fx    . HIP ARTHROPLASTY Right 10/12/2012   Procedure: ARTHROPLASTY BIPOLAR HIP;  Surgeon: Gearlean Alf, MD;  Location: [facility];  Service: Orthopedics;  Laterality: Right;  . KNEE ARTHROSCOPY  04/17/2011, right   Procedure: ARTHROSCOPY KNEE;  Surgeon: Gearlean Alf;  Location: [facility];  Service: Orthopedics;  Laterality: Right;  WITH LATERAL MENISCAL DEBRIDEMENT  . LUMBAR LAMINECTOMY/DECOMPRESSION MICRODISCECTOMY Right 12/28/2019   Procedure: Right Lumbar Four-Lumbar Five Microdiscectomy;  Surgeon: Erline Levine, MD;  Location: [facility];  Service: Neurosurgery;  Laterality: Right;  Right Lumbar Four-Lumbar Five Microdiscectomy   . MOLES EXCISED  2012   on nose  . Sternum Fx    . TOTAL KNEE ARTHROPLASTY  03/23/2012   Procedure: TOTAL KNEE ARTHROPLASTY;  Surgeon: Gearlean Alf, MD;  Location: [facility];  Service: Orthopedics;  Laterality: Right;  Marland Kitchen VAGINAL HYSTERECTOMY  1970   Leiomyomata    Current Outpatient Medications  Medication Sig Dispense Refill  . aspirin EC 81 MG tablet Take 81 mg by mouth daily. Swallow whole.    . cholecalciferol (VITAMIN D) 1000 units tablet Take 1,000 Units by mouth daily.     Marland Kitchen gabapentin (NEURONTIN) 100 MG capsule Take 100 mg by mouth 3 (three) times daily.    Marland Kitchen levothyroxine (SYNTHROID, LEVOTHROID) 75 MCG tablet Take 75 mcg by mouth daily before breakfast.    . meclizine (ANTIVERT) 25 MG tablet Take 25 mg by mouth 2 (two) times daily as needed.    . methocarbamol (ROBAXIN) 500 MG tablet Take 500 mg by mouth every 6 (six) hours as needed for muscle spasms.    . mirabegron ER (MYRBETRIQ) 50 MG TB24 tablet Take 1 tablet (50 mg total) by mouth daily. 30 tablet 11  . omeprazole (PRILOSEC) 40 MG capsule Take 40 mg by mouth 3 (three) times a week.     . vitamin E 400 UNIT capsule Take 400 Units by mouth daily.     . Zinc 50 MG TABS Take 50 mg by mouth daily.     No  current facility-administered medications for this visit.   Allergies:  Contrast media [iodinated diagnostic agents], Levaquin [levofloxacin in d5w], Penicillins, and Ciprofloxacin   ROS: Chronic arthritic pains.  Physical Exam: VS:  BP 110/60   Pulse 62   Ht 5\' 5"  (1.651 m)   Wt 137 lb 9.6 oz (62.4 kg)   SpO2 97%   BMI 22.90 kg/m , BMI Body mass index is 22.9 kg/m.  Wt Readings from Last 3 Encounters:  08/30/20 137 lb 9.6 oz (62.4 kg)  07/13/20 135 lb (61.2 kg)  02/25/20 135 lb (61.2 kg)    General: Elderly woman, appears comfortable at rest. HEENT: Conjunctiva and lids normal, wearing a mask. Neck: Supple, no elevated JVP or carotid bruits, no thyromegaly. Lungs: Clear to auscultation, nonlabored breathing  at rest. Cardiac: Regular rate and rhythm, no S3, soft systolic murmur. Extremities: No pitting edema, distal pulses 2+.  ECG:  An ECG dated 10/02/2019 was personally reviewed today and demonstrated:  Sinus rhythm with prolonged PR interval, IVCD.  Recent Labwork: 12/30/2019: ALT 19; AST 23; BUN 21; Creatinine, Ser 0.80; Hemoglobin 13.1; Platelets 135; Potassium 4.1; Sodium 138   Other Studies Reviewed Today:  Echocardiogram 04/08/2019: 1. Left ventricular ejection fraction, by visual estimation, is 55 to  60%. The left ventricle has normal function. There is mildly increased  left ventricular hypertrophy.  2. Abnormal septal motion consistent with left bundle branch block.  3. Left ventricular diastolic parameters are consistent with Grade I  diastolic dysfunction (impaired relaxation).  4. Global right ventricle has normal systolic function.The right  ventricular size is normal. No increase in right ventricular wall  thickness.  5. Left atrial size was normal.  6. Right atrial size was normal.  7. Small pericardial effusion.  8. The pericardial effusion is anterior to the right ventricle and  surrounding the apex.  9. Mild aortic valve annular calcification.  10. The mitral valve is grossly normal. Trace mitral valve regurgitation.  11. The tricuspid valve is grossly normal. Tricuspid valve regurgitation  is trivial.  12. The aortic valve is tricuspid. Aortic valve regurgitation is not  visualized.  13. The pulmonic valve was grossly normal. Pulmonic valve regurgitation is  trivial.  14. TR signal is inadequate for assessing pulmonary artery systolic  pressure.  15. The inferior vena cava is normal in size with greater than 50%  respiratory variability, suggesting right atrial pressure of 3 mmHg.   Lexiscan Myoview 04/02/2019:  Left bundle branch block present throughout with rare PACs and PVCs.  Small, mild intensity, mid to basal inferoseptal defect that is  partially reversible, suggestive of variable soft tissue attenuation versus mild ischemic territory. Increased TID ratio looks to be due to misregistration rather than actual dilatation.  This is a low risk study.  Nuclear stress EF: 73%.  Assessment and Plan:  1.  Chronic dyspnea exertion, stable and without associated chest discomfort.  Cardiac structural and ischemic testing from 2020 outlined above.  Continue aspirin.  She is no longer on Imdur.  2.  Chronic left bundle branch block by ECG.  No palpitations or syncope.  Medication Adjustments/Labs and Tests Ordered: Current medicines are reviewed at length with the patient today.  Concerns regarding medicines are outlined above.   Tests Ordered: No orders of the defined types were placed in this encounter.   Medication Changes: No orders of the defined types were placed in this encounter.   Disposition:  Follow up 6 months in the [place] office.  Signed, Aloha Gell  Domenic Polite, MD, [facility] 08/30/2020 12:26 PM     [facility] [place], [address], [place] [address] Phone: [phone]; Fax: [phone]

## 2020-08-30 ENCOUNTER — Encounter: Payer: Self-pay | Admitting: Cardiology

## 2020-08-30 ENCOUNTER — Ambulatory Visit (INDEPENDENT_AMBULATORY_CARE_PROVIDER_SITE_OTHER): Payer: Medicare Other | Admitting: Cardiology

## 2020-08-30 VITALS — BP 110/60 | HR 62 | Ht 65.0 in | Wt 137.6 lb

## 2020-08-30 DIAGNOSIS — I447 Left bundle-branch block, unspecified: Secondary | ICD-10-CM

## 2020-08-30 DIAGNOSIS — R002 Palpitations: Secondary | ICD-10-CM

## 2020-08-30 DIAGNOSIS — R0600 Dyspnea, unspecified: Secondary | ICD-10-CM

## 2020-08-30 DIAGNOSIS — R0609 Other forms of dyspnea: Secondary | ICD-10-CM

## 2020-08-30 NOTE — Patient Instructions (Addendum)

## 2020-09-05 ENCOUNTER — Other Ambulatory Visit: Payer: Self-pay

## 2020-09-05 ENCOUNTER — Encounter: Payer: Self-pay | Admitting: Obstetrics & Gynecology

## 2020-09-05 ENCOUNTER — Ambulatory Visit (INDEPENDENT_AMBULATORY_CARE_PROVIDER_SITE_OTHER): Payer: Medicare Other | Admitting: Obstetrics & Gynecology

## 2020-09-05 VITALS — BP 134/70 | Ht 65.0 in | Wt 136.0 lb

## 2020-09-05 DIAGNOSIS — M810 Age-related osteoporosis without current pathological fracture: Secondary | ICD-10-CM | POA: Diagnosis not present

## 2020-09-05 MED ORDER — DENOSUMAB 60 MG/ML ~~LOC~~ SOSY
60.0000 mg | PREFILLED_SYRINGE | Freq: Once | SUBCUTANEOUS | Status: AC
  Administered 2020-09-05: 60 mg via SUBCUTANEOUS

## 2020-09-05 NOTE — Progress Notes (Signed)
    Debra Richards February 04, 1926 [id number]        87 y.o.  G2P2002   RP: Counseling on management of Osteoporosis  HPI: H/O Osteoporosis.  Last BD in the chart dates from 2018 showing a T-Score of -2.4.  On Prolia injections, but not received in the passed year as patient had other medical issues.  Had Orthopedic surgeries.     OB History  Gravida Para Term Preterm AB Living  2 2 2     2   SAB IAB Ectopic Multiple Live Births               # Outcome Date GA Lbr Len/2nd Weight Sex Delivery Anes PTL Lv  2 Term           1 Term             Past medical history,surgical history, problem list, medications, allergies, family history and social history were all reviewed and documented in the EPIC chart.   Directed ROS with pertinent positives and negatives documented in the history of present illness/assessment and plan.  Exam:  Vitals:   09/05/20 1013  BP: 134/70  Weight: 136 lb (61.7 kg)  Height: 5\' 5"  (1.651 m)   General appearance:  Normal  Gynecologic exam: Not performed  Bone Density 2018:  Lowest T-Score -2.4  Ca++ 01/2020 normal at 9.5   Assessment/Plan:  87 y.o. G2P2002   1. Postmenopausal osteoporosis History of osteoporosis previously on Prolia injections.  No injections for the past year because patient had other medical issues including orthopedic surgeries.  Last bone density in the chart is from 2018 with the lowest T score at -2.4.  We will repeat a bone density here now.  Recommend restarting on Prolia.  Calcium normal at 9.30 January 2020.  Recommend vitamin D supplements, calcium intake of 1.5 g/day total and regular weightbearing physical activities. - DG Bone Density; Future  Debra Bruins MD, 10:35 AM 09/05/2020

## 2020-09-15 ENCOUNTER — Emergency Department (HOSPITAL_COMMUNITY)
Admission: EM | Admit: 2020-09-15 | Discharge: 2020-09-15 | Disposition: A | Discharge status: Home / Self Care | Payer: Medicare Other | Attending: Emergency Medicine | Admitting: Emergency Medicine

## 2020-09-15 ENCOUNTER — Emergency Department (HOSPITAL_COMMUNITY): Payer: Medicare Other

## 2020-09-15 ENCOUNTER — Telehealth: Payer: Self-pay | Admitting: Cardiology

## 2020-09-15 ENCOUNTER — Other Ambulatory Visit: Payer: Self-pay

## 2020-09-15 ENCOUNTER — Encounter (HOSPITAL_COMMUNITY): Payer: Self-pay | Admitting: *Deleted

## 2020-09-15 DIAGNOSIS — Z79899 Other long term (current) drug therapy: Secondary | ICD-10-CM | POA: Insufficient documentation

## 2020-09-15 DIAGNOSIS — E039 Hypothyroidism, unspecified: Secondary | ICD-10-CM | POA: Diagnosis not present

## 2020-09-15 DIAGNOSIS — Z7982 Long term (current) use of aspirin: Secondary | ICD-10-CM | POA: Diagnosis not present

## 2020-09-15 DIAGNOSIS — Z96641 Presence of right artificial hip joint: Secondary | ICD-10-CM | POA: Insufficient documentation

## 2020-09-15 DIAGNOSIS — R000 Tachycardia, unspecified: Secondary | ICD-10-CM | POA: Insufficient documentation

## 2020-09-15 DIAGNOSIS — Z96651 Presence of right artificial knee joint: Secondary | ICD-10-CM | POA: Diagnosis not present

## 2020-09-15 DIAGNOSIS — R0602 Shortness of breath: Secondary | ICD-10-CM | POA: Diagnosis not present

## 2020-09-15 DIAGNOSIS — R531 Weakness: Secondary | ICD-10-CM | POA: Diagnosis not present

## 2020-09-15 LAB — CBC WITH DIFFERENTIAL/PLATELET
Abs Immature Granulocytes: 0.01 10*3/uL (ref 0.00–0.07)
Basophils Absolute: 0 10*3/uL (ref 0.0–0.1)
Basophils Relative: 0 %
Eosinophils Absolute: 0 10*3/uL (ref 0.0–0.5)
Eosinophils Relative: 1 %
HCT: 46.8 % — ABNORMAL HIGH (ref 36.0–46.0)
Hemoglobin: 15.3 g/dL — ABNORMAL HIGH (ref 12.0–15.0)
Immature Granulocytes: 0 %
Lymphocytes Relative: 16 %
Lymphs Abs: 0.9 10*3/uL (ref 0.7–4.0)
MCH: 32.8 pg (ref 26.0–34.0)
MCHC: 32.7 g/dL (ref 30.0–36.0)
MCV: 100.4 fL — ABNORMAL HIGH (ref 80.0–100.0)
Monocytes Absolute: 0.5 10*3/uL (ref 0.1–1.0)
Monocytes Relative: 9 %
Neutro Abs: 4.1 10*3/uL (ref 1.7–7.7)
Neutrophils Relative %: 74 %
Platelets: 149 10*3/uL — ABNORMAL LOW (ref 150–400)
RBC: 4.66 MIL/uL (ref 3.87–5.11)
RDW: 14.2 % (ref 11.5–15.5)
WBC: 5.5 10*3/uL (ref 4.0–10.5)
nRBC: 0 % (ref 0.0–0.2)

## 2020-09-15 LAB — URINALYSIS, ROUTINE W REFLEX MICROSCOPIC
Bilirubin Urine: NEGATIVE
Glucose, UA: NEGATIVE mg/dL
Hgb urine dipstick: NEGATIVE
Ketones, ur: 20 mg/dL — AB
Leukocytes,Ua: NEGATIVE
Nitrite: NEGATIVE
Protein, ur: NEGATIVE mg/dL
Specific Gravity, Urine: 1.013 (ref 1.005–1.030)
pH: 8 (ref 5.0–8.0)

## 2020-09-15 LAB — COMPREHENSIVE METABOLIC PANEL
ALT: 18 U/L (ref 0–44)
AST: 22 U/L (ref 15–41)
Albumin: 3.8 g/dL (ref 3.5–5.0)
Alkaline Phosphatase: 110 U/L (ref 38–126)
Anion gap: 7 (ref 5–15)
BUN: 16 mg/dL (ref 8–23)
CO2: 24 mmol/L (ref 22–32)
Calcium: 8.2 mg/dL — ABNORMAL LOW (ref 8.9–10.3)
Chloride: 107 mmol/L (ref 98–111)
Creatinine, Ser: 0.8 mg/dL (ref 0.44–1.00)
GFR, Estimated: >60 mL/min (ref >60)
Glucose, Bld: 102 mg/dL — ABNORMAL HIGH (ref 70–99)
Potassium: 4.2 mmol/L (ref 3.5–5.1)
Sodium: 138 mmol/L (ref 135–145)
Total Bilirubin: 0.9 mg/dL (ref 0.3–1.2)
Total Protein: 6.4 g/dL — ABNORMAL LOW (ref 6.5–8.1)

## 2020-09-15 LAB — TROPONIN I (HIGH SENSITIVITY)
Troponin I (High Sensitivity): 6 ng/L (ref <18)
Troponin I (High Sensitivity): 5 ng/L (ref <18)

## 2020-09-15 LAB — D-DIMER, QUANTITATIVE: D-Dimer, Quant: 0.63 ug/mL-FEU — ABNORMAL HIGH (ref 0.00–0.50)

## 2020-09-15 LAB — TSH: TSH: 6.797 u[IU]/mL — ABNORMAL HIGH (ref 0.350–4.500)

## 2020-09-15 MED ORDER — SODIUM CHLORIDE 0.9 % IV BOLUS
500.0000 mL | Freq: Once | INTRAVENOUS | Status: AC
  Administered 2020-09-15: 500 mL via INTRAVENOUS

## 2020-09-15 NOTE — ED Notes (Signed)
Patient to CT at this time

## 2020-09-15 NOTE — ED Triage Notes (Signed)
Pt c/o SOB and weakness that started last night, but worsened this morning. Denies fever, cough. Pt does c/o slight pain in her shoulder blades.

## 2020-09-15 NOTE — Telephone Encounter (Signed)
Per phone call from pt- she's feeling really weak this morning, having SOB, she has something unusual going on.

## 2020-09-15 NOTE — ED Notes (Signed)
Assisted pt with a walker pt did well

## 2020-09-15 NOTE — ED Provider Notes (Signed)
[facility] EMERGENCY DEPARTMENT Provider Note   CSN: [id number] Arrival date & time: 09/15/20  1004     History Chief Complaint  Patient presents with  . Shortness of Breath    Debra Richards is a 87 y.o. female.  HPI Patient is a 87 year old female with a history of left bundle branch block, MVP, hypothyroidism, RA, who presents the emergency department due to shortness of breath.  Patient states that she got her second shingles vaccine yesterday.  During the day she was feeling intermittently short of breath with exertion.  She woke up this morning and states that her shortness of breath has worsened and is now persistent.  She notes shortness of breath at rest that worsens significantly with any exertion.  Denies any chest pain or chest tightness.  No abdominal pain, nausea, or vomiting.  No URI symptoms.  No other complaints.  She states she has been compliant with her regular medications.  She followed up with her cardiology office this morning and they told her to come to the emergency department for evaluation.  No leg swelling, hemoptysis, or history of blood clots.  Cardiology: Dr. Rozann Lesches    Past Medical History:  Diagnosis Date  . Complication of anesthesia    Trouble waking up  . GERD (gastroesophageal reflux disease)   . History of kidney stones   . Hypothyroidism   . LBBB (left bundle branch block)   . MVP (mitral valve prolapse)   . Osteoporosis   . Palpitations   . RA (rheumatoid arthritis) ([place])   . Right ureteral stone   . Rosacea     Patient Active Problem List   Diagnosis Date Noted  . Herniated lumbar disc without myelopathy 12/28/2019  . Weakness 02/15/2016  . Acute parotitis 02/15/2016  . Atrial fibrillation ([place]) 02/15/2016  . Hypokalemia 02/15/2016  . Atypical chest pain 10/27/2013  . Anxiety 10/27/2013  . Pectus excavatum 04/25/2013  . UTI (lower urinary tract infection) 02/28/2013  . Hypotension 02/28/2013  . Acute renal failure  ([place]) 02/28/2013  . Generalized weakness 02/28/2013  . Thrombocytopenia ([place]) 02/28/2013  . Acute encephalopathy 02/28/2013  . Altered mental status 10/14/2012  . Hip fracture, right ([place]) 10/12/2012  . Altered awareness, transient 03/25/2012  . Delirium 03/25/2012  . Leukocytosis 03/25/2012  . Postop Acute blood loss anemia 03/24/2012  . OA (osteoarthritis) of knee 03/23/2012  . Right ovarian cyst   . MVP (mitral valve prolapse)   . Coronary artery disease   . GERD (gastroesophageal reflux disease)   . History of kidney stones   . RA (rheumatoid arthritis) ([place])   . Dysrhythmia   . Lateral meniscal tear   . Rosacea   . Right ureteral stone   . LBBB (left bundle branch block)   . Edema   . Hypothyroidism   . GERD 01/22/2010  . CONSTIPATION 01/22/2010  . DYSPHAGIA UNSPECIFIED 01/22/2010  . ABDOMINAL PAIN RIGHT LOWER QUADRANT 01/22/2010    Past Surgical History:  Procedure Laterality Date  . BACK SURGERY    . CATARACT EXTRACTION W/ INTRAOCULAR LENS  IMPLANT, BILATERAL  4 yrs ago   both eyes  . CHOLECYSTECTOMY  2005  . EXTRACORPOREAL SHOCK WAVE LITHOTRIPSY  09-24-10   and 1990  . Femur fx    . HIP ARTHROPLASTY Right 10/12/2012   Procedure: ARTHROPLASTY BIPOLAR HIP;  Surgeon: Gearlean Alf, MD;  Location: [facility];  Service: Orthopedics;  Laterality: Right;  . KNEE ARTHROSCOPY  04/17/2011, right   Procedure:  ARTHROSCOPY KNEE;  Surgeon: Gearlean Alf;  Location: [place];  Service: Orthopedics;  Laterality: Right;  WITH LATERAL MENISCAL DEBRIDEMENT  . LUMBAR LAMINECTOMY/DECOMPRESSION MICRODISCECTOMY Right 12/28/2019   Procedure: Right Lumbar Four-Lumbar Five Microdiscectomy;  Surgeon: Erline Levine, MD;  Location: [place];  Service: Neurosurgery;  Laterality: Right;  Right Lumbar Four-Lumbar Five Microdiscectomy  . MOLES EXCISED  2012   on nose  . Sternum Fx    . TOTAL KNEE ARTHROPLASTY  03/23/2012   Procedure: TOTAL KNEE ARTHROPLASTY;  Surgeon: Gearlean Alf, MD;  Location: [facility];  Service: Orthopedics;  Laterality: Right;  Marland Kitchen VAGINAL HYSTERECTOMY  1970   Leiomyomata     OB History    Gravida  2   Para  2   Term  2   Preterm      AB      Living  2     SAB      IAB      Ectopic      Multiple      Live Births              Family History  Problem Relation Age of Onset  . Cancer Mother        COLON  . Heart disease Father        STROKE  . Stroke Father   . Ovarian cancer Maternal Grandmother   . Crohn's disease Daughter     Social History   Tobacco Use  . Smoking status: Never Smoker  . Smokeless tobacco: Never Used  Vaping Use  . Vaping Use: Never used  Substance Use Topics  . Alcohol use: No    Alcohol/week: 0.0 standard drinks  . Drug use: No    Home Medications Prior to Admission medications   Medication Sig Start Date End Date Taking? Authorizing Provider  aspirin EC 81 MG tablet Take 81 mg by mouth daily. Swallow whole.   Yes [provider]  cholecalciferol (VITAMIN D) 1000 units tablet Take 1,000 Units by mouth daily.    Yes [provider]  levothyroxine (SYNTHROID, LEVOTHROID) 75 MCG tablet Take 75 mcg by mouth daily before breakfast.   Yes [provider]  meclizine (ANTIVERT) 25 MG tablet Take 25 mg by mouth 2 (two) times daily as needed for dizziness. 06/21/20  Yes [provider]  mirabegron ER (MYRBETRIQ) 50 MG TB24 tablet Take 1 tablet (50 mg total) by mouth daily. Patient taking differently: Take 25 mg by mouth daily. 02/25/20  Yes Irine Seal, MD  omeprazole (PRILOSEC) 40 MG capsule Take 40 mg by mouth 3 (three) times a week.  09/15/19  Yes [provider]  vitamin E 400 UNIT capsule Take 400 Units by mouth daily.    Yes [provider]  Zinc 50 MG TABS Take 50 mg by mouth daily.   Yes [provider]    Allergies    Contrast media [iodinated diagnostic agents], Levaquin [levofloxacin in d5w], Penicillins, and  Ciprofloxacin  Review of Systems   Review of Systems  All other systems reviewed and are negative. Ten systems reviewed and are negative for acute change, except as noted in the HPI.   Physical Exam Updated Vital Signs BP (!) 119/59   Pulse 71   Temp 98 F (36.7 C) (Oral)   Resp (!) 24   Ht 5\' 5"  (1.651 m)   Wt 61.2 kg   SpO2 100%   BMI 22.47 kg/m   Physical Exam  Vitals and nursing note reviewed.  Constitutional:      General: She is not in acute distress.    Appearance: Normal appearance. She is well-developed and normal weight. She is not ill-appearing, toxic-appearing or diaphoretic.  HENT:     Head: Normocephalic and atraumatic.     Right Ear: External ear normal.     Left Ear: External ear normal.     Nose: Nose normal.     Mouth/Throat:     Mouth: Mucous membranes are moist.     Pharynx: Oropharynx is clear. No oropharyngeal exudate or posterior oropharyngeal erythema.  Eyes:     Extraocular Movements: Extraocular movements intact.  Cardiovascular:     Rate and Rhythm: Regular rhythm. Tachycardia present.     Pulses: Normal pulses.     Heart sounds: Normal heart sounds. No murmur heard. No friction rub. No gallop.   Pulmonary:     Effort: Pulmonary effort is normal. No tachypnea, bradypnea, accessory muscle usage or respiratory distress.     Breath sounds: Normal breath sounds. No stridor. No decreased breath sounds, wheezing, rhonchi or rales.  Abdominal:     General: Abdomen is flat.     Palpations: Abdomen is soft.     Tenderness: There is no abdominal tenderness.  Musculoskeletal:        General: Normal range of motion.     Cervical back: Normal range of motion and neck supple. No tenderness.     Right lower leg: No edema.     Left lower leg: No edema.  Skin:    General: Skin is warm and dry.  Neurological:     General: No focal deficit present.     Mental Status: She is alert and oriented to person, place, and time.  Psychiatric:        Mood and  Affect: Mood normal.        Behavior: Behavior normal.    ED Results / Procedures / Treatments   Labs (all labs ordered are listed, but only abnormal results are displayed) Labs Reviewed  COMPREHENSIVE METABOLIC PANEL - Abnormal; Notable for the following components:      Result Value   Glucose, Bld 102 (*)    Calcium 8.2 (*)    Total Protein 6.4 (*)    All other components within normal limits  CBC WITH DIFFERENTIAL/PLATELET - Abnormal; Notable for the following components:   Hemoglobin 15.3 (*)    HCT 46.8 (*)    MCV 100.4 (*)    Platelets 149 (*)    All other components within normal limits  D-DIMER, QUANTITATIVE - Abnormal; Notable for the following components:   D-Dimer, Quant 0.63 (*)    All other components within normal limits  URINALYSIS, ROUTINE W REFLEX MICROSCOPIC - Abnormal; Notable for the following components:   Ketones, ur 20 (*)    All other components within normal limits  TSH - Abnormal; Notable for the following components:   TSH 6.797 (*)    All other components within normal limits  TROPONIN I (HIGH SENSITIVITY)  TROPONIN I (HIGH SENSITIVITY)   EKG EKG Interpretation  Date/Time:  Friday September 15 2020 10:31:02 EDT Ventricular Rate:  116 PR Interval:  131 QRS Duration: 122 QT Interval:  369 QTC Calculation: 513 R Axis:   -7 Text Interpretation: Sinus or ectopic atrial tachycardia Left bundle branch block SINCE LAST TRACING HEART RATE HAS INCREASED Confirmed by Isla Pence [id number]) on 09/15/2020 10:49:11 AM  Radiology CT Chest Wo Contrast  Result  Date: 09/15/2020 CLINICAL DATA:  Chest pain, shortness of breath EXAM: CT CHEST WITHOUT CONTRAST TECHNIQUE: Multidetector CT imaging of the chest was performed following the standard protocol without IV contrast. COMPARISON:  CT 04/20/2013, MRI 05/30/2020 FINDINGS: Cardiovascular: Heart size is normal. No pericardial effusion. Thoracic aorta is nonaneurysmal. Atherosclerotic calcification of the aorta. Main  pulmonary trunk is nondilated. Mediastinum/Nodes: No enlarged mediastinal or axillary lymph nodes. Thyroid gland, trachea, and esophagus demonstrate no significant findings. Lungs/Pleura: Chronic biapical pleuroparenchymal scarring, similar in appearance to prior. Mild centrilobular emphysema. Cylindrical bronchiectasis throughout both lungs. 4 mm left lower lobe pulmonary nodule (series 4, image 105). No focal airspace consolidation. No pleural effusion. No pneumothorax. Upper Abdomen: Partially visualized bilateral renal stones. No collecting system dilation of the included portions of the kidneys. Musculoskeletal: Pectus excavatum deformity. Chronic mild superior endplate compression fracture of L2. Remote healed sternal body fracture. No acute osseous findings. No chest wall abnormality. IMPRESSION: 1. No acute findings in the chest. 2. Mild centrilobular emphysema with bronchiectasis throughout both lungs. 3. 4 mm left lower lobe pulmonary nodule. No follow-up needed if patient is low-risk. Non-contrast chest CT can be considered in 12 months if patient is high-risk. This recommendation follows the consensus statement: Guidelines for Management of Incidental Pulmonary Nodules Detected on CT Images: From the Fleischner Society 2017; Radiology 2017; 284:228-243. 4. Bilateral nephrolithiasis. Aortic Atherosclerosis (ICD10-I70.0) and Emphysema (ICD10-J43.9). Electronically Signed   By: Davina Poke D.O.   On: 09/15/2020 12:37   DG Chest Portable 1 View  Result Date: 09/15/2020 CLINICAL DATA:  Onset shortness of breath and weakness last night. EXAM: PORTABLE CHEST 1 VIEW COMPARISON:  PA and lateral chest 05/03/2019. FINDINGS: Lungs are clear. Heart size is normal. Aortic atherosclerosis. No pneumothorax or pleural fluid. No acute or focal bony abnormality. IMPRESSION: No acute disease. Aortic Atherosclerosis (ICD10-I70.0). Electronically Signed   By: Inge Rise M.D.   On: 09/15/2020 11:18    Procedures Procedures   Medications Ordered in ED Medications  sodium chloride 0.9 % bolus 500 mL (0 mLs Intravenous Stopped 09/15/20 1259)  sodium chloride 0.9 % bolus 500 mL (0 mLs Intravenous Stopped 09/15/20 1505)    ED Course  I have reviewed the triage vital signs and the nursing notes.  Pertinent labs & imaging results that were available during my care of the patient were reviewed by me and considered in my medical decision making (see chart for details).  Clinical Course as of 09/15/20 1642  Fri Sep 15, 2020  1135 Patient reassessed.  Heart rate has improved to about 100 bpm.  Age-adjusted D-dimer negative.  Will obtain a CT scan without contrast of the chest due to patient's contrast allergy as well as reassuring D-dimer.  We will continue to monitor. [LJ]    Clinical Course User Index [LJ] Rayna Sexton, PA-C   MDM Rules/Calculators/A&P                          Pt is a 87 y.o. female who presents the emergency department due to shortness of breath.  Labs: CBC with elevated hemoglobin and hematocrit.  Possibly hemoconcentration.  MCV of 100.4 and platelets of 149. CMP with a glucose of 102, calcium of 8.2, total protein of 6.4. UA shows 20 ketones. TSH mildly elevated at 6.797.  History of hypothyroidism on Synthroid. Troponin of 6 with a repeat of 5. D-dimer elevated at 0.63 when adjusted for age is negative.  Imaging: Chest x-ray shows no acute  disease. CT scan of the chest without contrast shows no acute findings.  I, Rayna Sexton, PA-C, personally reviewed and evaluated these images and lab results as part of my medical decision-making.  Unsure of the source of the patient's symptoms.  She was reevaluated on multiple occasions and notes improvement in her symptoms.  Possibly dehydrated.  She was mildly hypotensive and tachycardic upon arrival and with IV fluids this seems to have resolved.  She does appear to have a low BP at baseline.  She was ambulated  in the emergency department without issue.  Feel the patient is stable for discharge at this time and she is agreeable.  Recommended that she follow-up with cardiology regarding her symptoms.  Discussed return precautions at length and she knows to return to the ER if she develops any new or worsening symptoms.  Her questions were answered and she was amicable at the time of discharge.  Note: Portions of this report may have been transcribed using voice recognition software. Every effort was made to ensure accuracy; however, inadvertent computerized transcription errors may be present.   Final Clinical Impression(s) / ED Diagnoses Final diagnoses:  Shortness of breath   Rx / DC Orders ED Discharge Orders    None       Rayna Sexton, PA-C 09/15/20 1642    Isla Pence, MD 09/16/20 1643

## 2020-09-15 NOTE — Discharge Instructions (Addendum)
Please continue to monitor Debra Richards's symptoms.  If she develops any new or worsening symptoms, please bring her back to the emergency department.  It is important that she continue to stay hydrated.  She should be drinking at least 64 ounces of water per day.  I think that dehydration likely played a role in her symptoms today.  Please make sure you follow-up with her cardiologist tomorrow morning to schedule an appointment for reevaluation.  It was a pleasure to meet you both.

## 2020-09-15 NOTE — Telephone Encounter (Signed)
Pt c/o SOB/weakness/dizziness/palpitations since yesterday getting worse this morning - denies chest pain but says she feels "weak in her chest" - denies swelling/weight gain - doesn't know what HR/BP is - daughter is coming to pt home and will have her drive her to [facility] ED

## 2020-09-20 DIAGNOSIS — H43812 Vitreous degeneration, left eye: Secondary | ICD-10-CM | POA: Diagnosis not present

## 2020-09-20 DIAGNOSIS — Z961 Presence of intraocular lens: Secondary | ICD-10-CM | POA: Diagnosis not present

## 2020-09-20 DIAGNOSIS — H40013 Open angle with borderline findings, low risk, bilateral: Secondary | ICD-10-CM | POA: Diagnosis not present

## 2020-09-25 ENCOUNTER — Ambulatory Visit: Payer: Medicare Other | Admitting: Family Medicine

## 2020-09-28 DIAGNOSIS — Z299 Encounter for prophylactic measures, unspecified: Secondary | ICD-10-CM | POA: Diagnosis not present

## 2020-09-28 DIAGNOSIS — E7800 Pure hypercholesterolemia, unspecified: Secondary | ICD-10-CM | POA: Diagnosis not present

## 2020-09-28 DIAGNOSIS — D692 Other nonthrombocytopenic purpura: Secondary | ICD-10-CM | POA: Diagnosis not present

## 2020-09-28 DIAGNOSIS — N1831 Chronic kidney disease, stage 3a: Secondary | ICD-10-CM | POA: Diagnosis not present

## 2020-09-28 DIAGNOSIS — R0600 Dyspnea, unspecified: Secondary | ICD-10-CM | POA: Diagnosis not present

## 2020-10-17 ENCOUNTER — Ambulatory Visit (INDEPENDENT_AMBULATORY_CARE_PROVIDER_SITE_OTHER): Payer: Medicare Other | Admitting: Cardiology

## 2020-10-17 ENCOUNTER — Encounter: Payer: Self-pay | Admitting: Cardiology

## 2020-10-17 ENCOUNTER — Other Ambulatory Visit: Payer: Self-pay

## 2020-10-17 VITALS — BP 108/74 | HR 62 | Ht 65.0 in | Wt 138.6 lb

## 2020-10-17 DIAGNOSIS — I447 Left bundle-branch block, unspecified: Secondary | ICD-10-CM | POA: Diagnosis not present

## 2020-10-17 DIAGNOSIS — R0602 Shortness of breath: Secondary | ICD-10-CM

## 2020-10-17 NOTE — Progress Notes (Signed)
Cardiology Office Note  Date: 10/17/2020   ID: Debra Richards, DOB 1926/01/14, MRN [id number]  PCP:  Glenda Chroman, MD  Cardiologist:  Rozann Lesches, MD Electrophysiologist:  None   Chief Complaint  Patient presents with  . Cardiac follow-up    History of Present Illness: Debra Richards is a 87 y.o. female last seen in April.  She is here today with her daughter for a follow-up visit.  Records indicate ER visit in April with shortness of breath, had gotten second shingles vaccine a week before but reportedly tolerated it well. Noted to be tachycardic but no obvious acute findings by chest CTA and high-sensitivity troponin I levels were normal.  She tells me that she felt very weak that morning, heart rate also elevated.  ECG showed probable sinus tachycardia, less likely an ectopic atrial tachycardia.  She does not describe any regular sense of palpitations, no definite angina.  Stays very active with ADLs at age 10.  I talked with her about considering follow-up cardiac testing, she has not wanted to pursue a cardiac catheterization.  Ischemic evaluation in 2020 was overall low risk.  She did agree to consider a follow-up echocardiogram to ensure no worsening LVEF.  Past Medical History:  Diagnosis Date  . Complication of anesthesia    Trouble waking up  . GERD (gastroesophageal reflux disease)   . History of kidney stones   . Hypothyroidism   . LBBB (left bundle branch block)   . MVP (mitral valve prolapse)   . Osteoporosis   . Palpitations   . RA (rheumatoid arthritis) ([place])   . Right ureteral stone   . Rosacea     Past Surgical History:  Procedure Laterality Date  . BACK SURGERY    . CATARACT EXTRACTION W/ INTRAOCULAR LENS  IMPLANT, BILATERAL  4 yrs ago   both eyes  . CHOLECYSTECTOMY  2005  . EXTRACORPOREAL SHOCK WAVE LITHOTRIPSY  09-24-10   and 1990  . Femur fx    . HIP ARTHROPLASTY Right 10/12/2012   Procedure: ARTHROPLASTY BIPOLAR HIP;  Surgeon: Gearlean Alf, MD;  Location: [facility];  Service: Orthopedics;  Laterality: Right;  . KNEE ARTHROSCOPY  04/17/2011, right   Procedure: ARTHROSCOPY KNEE;  Surgeon: Gearlean Alf;  Location: [facility];  Service: Orthopedics;  Laterality: Right;  WITH LATERAL MENISCAL DEBRIDEMENT  . LUMBAR LAMINECTOMY/DECOMPRESSION MICRODISCECTOMY Right 12/28/2019   Procedure: Right Lumbar Four-Lumbar Five Microdiscectomy;  Surgeon: Erline Levine, MD;  Location: [place] [facility];  Service: Neurosurgery;  Laterality: Right;  Right Lumbar Four-Lumbar Five Microdiscectomy  . MOLES EXCISED  2012   on nose  . Sternum Fx    . TOTAL KNEE ARTHROPLASTY  03/23/2012   Procedure: TOTAL KNEE ARTHROPLASTY;  Surgeon: Gearlean Alf, MD;  [facility];  Service: Orthopedics;  Laterality: Right;  Marland Kitchen VAGINAL HYSTERECTOMY  1970   Leiomyomata    Current Outpatient Medications  Medication Sig Dispense Refill  . aspirin EC 81 MG tablet Take 81 mg by mouth daily. Swallow whole.    . cholecalciferol (VITAMIN D) 1000 units tablet Take 1,000 Units by mouth daily.     Marland Kitchen gabapentin (NEURONTIN) 100 MG capsule Take 100 mg by mouth 3 (three) times daily.    Marland Kitchen levothyroxine (SYNTHROID, LEVOTHROID) 75 MCG tablet Take 75 mcg by mouth daily before breakfast.    . meclizine (ANTIVERT) 25 MG tablet Take 25 mg by mouth 2 (two) times daily as needed for dizziness.    Marland Kitchen  mirabegron ER (MYRBETRIQ) 50 MG TB24 tablet Take 1 tablet (50 mg total) by mouth daily. (Patient taking differently: Take 25 mg by mouth daily.) 30 tablet 11  . omeprazole (PRILOSEC) 40 MG capsule Take 40 mg by mouth 3 (three) times a week.     . vitamin E 400 UNIT capsule Take 400 Units by mouth daily.     . Zinc 50 MG TABS Take 50 mg by mouth daily.     No current facility-administered medications for this visit.   Allergies:  Contrast media [iodinated diagnostic agents], Levaquin [levofloxacin in d5w], Penicillins, and Ciprofloxacin   ROS: No syncope.  Physical Exam: VS:   BP 108/74   Pulse 62   Ht 5\' 5"  (1.651 m)   Wt 138 lb 9.6 oz (62.9 kg)   SpO2 90%   BMI 23.06 kg/m , BMI Body mass index is 23.06 kg/m.  Wt Readings from Last 3 Encounters:  10/17/20 138 lb 9.6 oz (62.9 kg)  09/15/20 135 lb (61.2 kg)  09/05/20 136 lb (61.7 kg)    General: Patient appears comfortable at rest. HEENT: Conjunctiva and lids normal, wearing a mask. Neck: Supple, no elevated JVP or carotid bruits, no thyromegaly. Lungs: Clear to auscultation, nonlabored breathing at rest. Cardiac: Regular rate and rhythm, no S3, soft systolic murmur. Extremities: No pitting edema.  ECG:  An ECG dated 09/15/2020 was personally reviewed today and demonstrated:  Probable sinus tachycardia with left bundle branch block.  Recent Labwork: 09/15/2020: ALT 18; AST 22; BUN 16; Creatinine, Ser 0.80; Hemoglobin 15.3; Platelets 149; Potassium 4.2; Sodium 138; TSH 6.797, high-sensitivity troponin I of 5 and 6, D-dimer 0.63  Other Studies Reviewed Today:  Echocardiogram 04/08/2019: 1. Left ventricular ejection fraction, by visual estimation, is 55 to  60%. The left ventricle has normal function. There is mildly increased  left ventricular hypertrophy.  2. Abnormal septal motion consistent with left bundle branch block.  3. Left ventricular diastolic parameters are consistent with Grade I  diastolic dysfunction (impaired relaxation).  4. Global right ventricle has normal systolic function.The right  ventricular size is normal. No increase in right ventricular wall  thickness.  5. Left atrial size was normal.  6. Right atrial size was normal.  7. Small pericardial effusion.  8. The pericardial effusion is anterior to the right ventricle and  surrounding the apex.  9. Mild aortic valve annular calcification.  10. The mitral valve is grossly normal. Trace mitral valve regurgitation.  11. The tricuspid valve is grossly normal. Tricuspid valve regurgitation  is trivial.  12. The aortic  valve is tricuspid. Aortic valve regurgitation is not  visualized.  13. The pulmonic valve was grossly normal. Pulmonic valve regurgitation is  trivial.  14. TR signal is inadequate for assessing pulmonary artery systolic  pressure.  15. The inferior vena cava is normal in size with greater than 50%  respiratory variability, suggesting right atrial pressure of 3 mmHg.   Lexiscan Myoview 04/02/2019:  Left bundle branch block present throughout with rare PACs and PVCs.  Small, mild intensity, mid to basal inferoseptal defect that is partially reversible, suggestive of variable soft tissue attenuation versus mild ischemic territory. Increased TID ratio looks to be due to misregistration rather than actual dilatation.  This is a low risk study.  Nuclear stress EF: 73%.  Chest CTA 09/15/2020: IMPRESSION: 1. No acute findings in the chest. 2. Mild centrilobular emphysema with bronchiectasis throughout both lungs. 3. 4 mm left lower lobe pulmonary nodule. No follow-up needed if  patient is low-risk. Non-contrast chest CT can be considered in 12 months if patient is high-risk. This recommendation follows the consensus statement: Guidelines for Management of Incidental Pulmonary Nodules Detected on CT Images: From the Fleischner Society 2017; Radiology 2017; 284:228-243. 4. Bilateral nephrolithiasis.  Aortic Atherosclerosis (ICD10-I70.0) and Emphysema (ICD10-J43.9).  Assessment and Plan:  1.  Chronic dyspnea on exertion.  I reviewed her ER visit and testing from April.  Although not excluding an ectopic atrial tachycardia as etiology, there was otherwise no evidence of ACS or acute pulmonary findings.  She does not describe any irregular sense of palpitations.  Plan to obtain an echocardiogram to ensure stable LVEF and no increasing pulmonary pressures.  She has preferred to hold off on invasive cardiac testing.  2.  Chronic left bundle branch block.  Medication Adjustments/Labs and  Tests Ordered: Current medicines are reviewed at length with the patient today.  Concerns regarding medicines are outlined above.   Tests Ordered: Orders Placed This Encounter  Procedures  . ECHOCARDIOGRAM COMPLETE    Medication Changes: No orders of the defined types were placed in this encounter.   Disposition:  Follow up test results.  Signed, Satira Sark, MD, [facility] 10/17/2020 10:15 AM    [facility]. [address] [place], [place] [address] Phone: [phone]; Fax: [phone]

## 2020-10-17 NOTE — Patient Instructions (Signed)
Medication Instructions:  Your physician recommends that you continue on your current medications as directed. Please refer to the Current Medication list given to you today.  *If you need a refill on your cardiac medications before your next appointment, please call your pharmacy*   Lab Work: None today  If you have labs (blood work) drawn today and your tests are completely normal, you will receive your results only by: Marland Kitchen MyChart Message (if you have MyChart) OR . A paper copy in the mail If you have any lab test that is abnormal or we need to change your treatment, we will call you to review the results.   Testing/Procedures: Your physician has requested that you have an echocardiogram. Echocardiography is a painless test that uses sound waves to create images of your heart. It provides your doctor with information about the size and shape of your heart and how well your heart's chambers and valves are working. This procedure takes approximately one hour. There are no restrictions for this procedure.      Follow-Up: At [facility], you and your health needs are our priority.  As part of our continuing mission to provide you with exceptional heart care, we have created designated Provider Care Teams.  These Care Teams include your primary Cardiologist (physician) and Advanced Practice Providers (APPs -  Physician Assistants and Nurse Practitioners) who all work together to provide you with the care you need, when you need it.  We recommend signing up for the patient portal called "MyChart".  Sign up information is provided on this After Visit Summary.  MyChart is used to connect with patients for Virtual Visits (Telemedicine).  Patients are able to view lab/test results, encounter notes, upcoming appointments, etc.  Non-urgent messages can be sent to your provider as well.   To learn more about what you can do with MyChart, go to [email or web address].    Your next appointment:  To  be determined after echocardiogram results reviewed.

## 2020-10-26 ENCOUNTER — Other Ambulatory Visit: Payer: Self-pay

## 2020-10-26 ENCOUNTER — Encounter: Payer: Self-pay | Admitting: Urology

## 2020-10-26 ENCOUNTER — Ambulatory Visit (INDEPENDENT_AMBULATORY_CARE_PROVIDER_SITE_OTHER): Payer: Medicare Other | Admitting: Urology

## 2020-10-26 VITALS — BP 122/62 | HR 62 | Ht 65.0 in

## 2020-10-26 DIAGNOSIS — R109 Unspecified abdominal pain: Secondary | ICD-10-CM

## 2020-10-26 DIAGNOSIS — N3941 Urge incontinence: Secondary | ICD-10-CM | POA: Diagnosis not present

## 2020-10-26 DIAGNOSIS — N200 Calculus of kidney: Secondary | ICD-10-CM | POA: Diagnosis not present

## 2020-10-26 DIAGNOSIS — R351 Nocturia: Secondary | ICD-10-CM | POA: Diagnosis not present

## 2020-10-26 DIAGNOSIS — R3915 Urgency of urination: Secondary | ICD-10-CM

## 2020-10-26 LAB — URINALYSIS, ROUTINE W REFLEX MICROSCOPIC
Bilirubin, UA: NEGATIVE
Glucose, UA: NEGATIVE
Ketones, UA: NEGATIVE
Nitrite, UA: NEGATIVE
Protein,UA: NEGATIVE
RBC, UA: NEGATIVE
Specific Gravity, UA: 1.015 (ref 1.005–1.030)
Urobilinogen, Ur: 0.2 mg/dL (ref 0.2–1.0)
pH, UA: 6 (ref 5.0–7.5)

## 2020-10-26 LAB — MICROSCOPIC EXAMINATION
RBC, Urine: NONE SEEN /hpf (ref 0–2)
Renal Epithel, UA: NONE SEEN /hpf

## 2020-10-26 NOTE — Progress Notes (Signed)
Urological Symptom Review  Patient is experiencing the following symptoms: Frequent urination Get up at night to urinate   Review of Systems  Gastrointestinal (upper)  : Negative for upper GI symptoms  Gastrointestinal (lower) : Negative for lower GI symptoms  Constitutional : Negative for symptoms  Skin: Skin rash/lesion  Eyes: Negative for eye symptoms  Ear/Nose/Throat : None  Hematologic/Lymphatic: Negative for Hematologic/Lymphatic symptoms  Cardiovascular : Negative for cardiovascular symptoms  Respiratory : Shortness of breath  Endocrine: Negative for endocrine symptoms  Musculoskeletal: Negative for musculoskeletal symptoms  Neurological: Negative for neurological symptoms  Psychologic: Negative for psychiatric symptoms

## 2020-10-26 NOTE — Progress Notes (Signed)
Subjective:  1. Urge incontinence   2. Nocturia   3. Urgency of urination   4. Renal stones   5. Flank pain      HPI: Debra Richards is a 87 year-old female established patient who is here for follow up of incontinence and stones. .   10/26/20: Debra Richards was given Myrbetriq 50mg  at her last visit and she is doing well with reduced incontinence.  She has cut it to 25mg  because of expense.  She held it and her symptoms recurred.   She didn't get the KUB that was ordered for prior to this visit.   She had a CT Chest in 4/22 that showed bilateral renal stones without obstruction.   A Lumbar MRI in 1/22 also showed the stone.  She has had no hematuria.  She has some right sided pain but she has a history of lumbar spine disease and had surgery in 8/21.    GU Hx: Debra Richards returns today with the complaint of progressive incontinence over the last 3 years but it has been present for a decade. She has tried Debra Richards and Debra Richards without success. She remains Myrbetriq 50mg  which helps but she still has nocturia x 4-5x.  She has urgency with UUI.  She has had no flank pain or hematuria and her UA is ok today.    She has had PTNS and Botox discussed but hasn't done either. She has marked frequency and urgency and will have UUI. She doesn't have SUI. She has nocturia with enuresis. She has no dysuria or hematuria. She has a history of stones with occasional mild right flank pain. She last passed a stone 4+ years ago. She has DDD with chronic back pain and is recovering from recent surgery by Debra Richards. Her UA is ok.        ROS:  ROS:  A complete review of systems was performed.  All systems are negative except for pertinent findings as noted.   ROS  Allergies  Allergen Reactions  . Penicillins Hives    03/04/13: Pt has tolerated Amoxicillin/Unsyn without reaction  . Contrast Media [Iodinated Diagnostic Agents] Other (See Comments)    Arm swelled up and was painful after IVP years ago/   . Levaquin  [Levofloxacin In D5w] Other (See Comments)    Made her sick all over  . Ciprofloxacin Rash    Outpatient Encounter Medications as of 10/26/2020  Medication Sig  . aspirin EC 81 MG tablet Take 81 mg by mouth daily. Swallow whole.  . cholecalciferol (VITAMIN D) 1000 units tablet Take 1,000 Units by mouth daily.   Marland Kitchen gabapentin (NEURONTIN) 100 MG capsule Take 100 mg by mouth 3 (three) times daily.  Marland Kitchen levothyroxine (SYNTHROID, LEVOTHROID) 75 MCG tablet Take 75 mcg by mouth daily before breakfast.  . meclizine (ANTIVERT) 25 MG tablet Take 25 mg by mouth 2 (two) times daily as needed for dizziness.  . mirabegron ER (MYRBETRIQ) 50 MG TB24 tablet Take 1 tablet (50 mg total) by mouth daily. (Patient taking differently: Take 25 mg by mouth daily.)  . omeprazole (PRILOSEC) 40 MG capsule Take 40 mg by mouth 3 (three) times a week.   . vitamin E 400 UNIT capsule Take 400 Units by mouth daily.   . Zinc 50 MG TABS Take 50 mg by mouth daily.   No facility-administered encounter medications on file as of 10/26/2020.    Past Medical History:  Diagnosis Date  . Complication of anesthesia    Trouble waking up  .  GERD (gastroesophageal reflux disease)   . History of kidney stones   . Hypothyroidism   . LBBB (left bundle branch block)   . MVP (mitral valve prolapse)   . Osteoporosis   . Palpitations   . RA (rheumatoid arthritis) ([place])   . Right ureteral stone   . Rosacea     Past Surgical History:  Procedure Laterality Date  . BACK SURGERY    . CATARACT EXTRACTION W/ INTRAOCULAR LENS  IMPLANT, BILATERAL  4 yrs ago   both eyes  . CHOLECYSTECTOMY  2005  . EXTRACORPOREAL SHOCK WAVE LITHOTRIPSY  09-24-10   and 1990  . Femur fx    . HIP ARTHROPLASTY Right 10/12/2012   Procedure: ARTHROPLASTY BIPOLAR HIP;  Surgeon: Gearlean Alf, MD;  Location: [facility];  Service: Orthopedics;  Laterality: Right;  . KNEE ARTHROSCOPY  04/17/2011, right   Procedure: ARTHROSCOPY KNEE;  Surgeon: Gearlean Alf;  Location:  [facility];  Service: Orthopedics;  Laterality: Right;  WITH LATERAL MENISCAL DEBRIDEMENT  . LUMBAR LAMINECTOMY/DECOMPRESSION MICRODISCECTOMY Right 12/28/2019   Procedure: Right Lumbar Four-Lumbar Five Microdiscectomy;  Surgeon: Erline Levine, MD;  [facility];  Service: Neurosurgery;  Laterality: Right;  Right Lumbar Four-Lumbar Five Microdiscectomy  . MOLES EXCISED  2012   on nose  . Sternum Fx    . TOTAL KNEE ARTHROPLASTY  03/23/2012   Procedure: TOTAL KNEE ARTHROPLASTY;  Surgeon: Gearlean Alf, MD;  Location: [facility];  Service: Orthopedics;  Laterality: Right;  Marland Kitchen VAGINAL HYSTERECTOMY  1970   Leiomyomata    Social History   Socioeconomic History  . Marital status: Widowed    Spouse name: Not on file  . Number of children: Not on file  . Years of education: Not on file  . Highest education level: Not on file  Occupational History  . Not on file  Tobacco Use  . Smoking status: Never Smoker  . Smokeless tobacco: Never Used  Vaping Use  . Vaping Use: Never used  Substance and Sexual Activity  . Alcohol use: No    Alcohol/week: 0.0 standard drinks  . Drug use: No  . Sexual activity: Not Currently    Birth control/protection: Post-menopausal, Surgical    Comment: 1st intercourse 17 yo-2 partners  Other Topics Concern  . Not on file  Social History Narrative   Lives in [place].   Lives c 2nd husband    NOK-Gina Axson   Social Determinants of Health   Financial Resource Strain: Not on file  Food Insecurity: Not on file  Transportation Needs: Not on file  Physical Activity: Not on file  Stress: Not on file  Social Connections: Not on file  Intimate Partner Violence: Not on file    Family History  Problem Relation Age of Onset  . Cancer Mother        COLON  . Heart disease Father        STROKE  . Stroke Father   . Ovarian cancer Maternal Grandmother   . Crohn's disease Daughter        Objective: Vitals:   10/26/20 1122  BP: 122/62  Pulse:  62     Physical Exam Vitals reviewed.  Constitutional:      Appearance: Normal appearance.  Abdominal:     General: Abdomen is flat.     Palpations: Abdomen is soft.     Tenderness: There is right CVA tenderness.  Neurological:     Mental Status: She is alert.  Lab Results:  Results for orders placed or performed in visit on 10/26/20 (from the past 24 hour(s))  Urinalysis, Routine w reflex microscopic     Status: Abnormal   Collection Time: 10/26/20 11:14 AM  Result Value Ref Range   Specific Gravity, UA 1.015 1.005 - 1.030   pH, UA 6.0 5.0 - 7.5   Color, UA Yellow Yellow   Appearance Ur Clear Clear   Leukocytes,UA Trace (A) Negative   Protein,UA Negative Negative/Trace   Glucose, UA Negative Negative   Ketones, UA Negative Negative   RBC, UA Negative Negative   Bilirubin, UA Negative Negative   Urobilinogen, Ur 0.2 0.2 - 1.0 mg/dL   Nitrite, UA Negative Negative   Microscopic Examination See below:    Narrative   Performed at:  [facility] [address], [place], [place]  [id number] Lab Director: Mina Marble MT, Phone:  [phone]  Microscopic Examination     Status: Abnormal   Collection Time: 10/26/20 11:14 AM   Urine  Result Value Ref Range   WBC, UA 6-10 (A) 0 - 5 /hpf   RBC None seen 0 - 2 /hpf   Epithelial Cells (non renal) 0-10 0 - 10 /hpf   Renal Epithel, UA None seen None seen /hpf   Bacteria, UA Few None seen/Few   Narrative   Performed at:  [facility] [address] [place], [place]  [id number] Lab Director: [place], Phone:  [phone]    BMET   Cr was 0.80 on 12/30/19  UA has 6-10 WBC and mod bacteria. Results for orders placed or performed in visit on 10/26/20 (from the past 24 hour(s))  Urinalysis, Routine w reflex microscopic     Status: Abnormal   Collection Time: 10/26/20 11:14 AM  Result Value Ref Range   Specific Gravity, UA 1.015 1.005 - 1.030   pH, UA 6.0 5.0 - 7.5   Color, UA Yellow  Yellow   Appearance Ur Clear Clear   Leukocytes,UA Trace (A) Negative   Protein,UA Negative Negative/Trace   Glucose, UA Negative Negative   Ketones, UA Negative Negative   RBC, UA Negative Negative   Bilirubin, UA Negative Negative   Urobilinogen, Ur 0.2 0.2 - 1.0 mg/dL   Nitrite, UA Negative Negative   Microscopic Examination See below:    Narrative   Performed at:  [facility] [address], [place], [place]  [id number] Lab Director: Mina Marble MT, Phone:  [phone]  Microscopic Examination     Status: Abnormal   Collection Time: 10/26/20 11:14 AM   Urine  Result Value Ref Range   WBC, UA 6-10 (A) 0 - 5 /hpf   RBC None seen 0 - 2 /hpf   Epithelial Cells (non renal) 0-10 0 - 10 /hpf   Renal Epithel, UA None seen None seen /hpf   Bacteria, UA Few None seen/Few   Narrative   Performed at:  [facility] [address], [place], [place]  [id number] Lab Director: [place], Phone:  [phone]    Studies/Results:  I have reviewed her recent hospital notes and the Chest CT and Lumbar MRI films and reports.   Assessment & Plan: OAB wet.   I have given her more samples of  Myrbetriq 50mg .  She will return in 62mo.  Kidney stones with some right flank pain.   I will get a KUB and Renal US.   I will let her know the  results.    No orders of the defined types were placed in this encounter.    Orders Placed This Encounter  Procedures  . Microscopic Examination  . DG Abd 1 View    Order Specific Question:   Reason for Exam (SYMPTOM  OR DIAGNOSIS REQUIRED)    Answer:   renal stones    Order Specific Question:   Preferred imaging location?    Answer:   [facility]    Order Specific Question:   Radiology Contrast Protocol - do NOT remove file path    Answer:   [email or web address]  . US RENAL    Order Specific Question:   Reason for Exam (SYMPTOM  OR DIAGNOSIS REQUIRED)     Answer:   right flank pain and renal stones.    Order Specific Question:   Preferred imaging location?    Answer:   [facility]  . Urinalysis, Routine w reflex microscopic      Return in about 6 months (around 04/27/2021).   CC: Glenda Chroman, MD      Irine Seal 10/26/2020 Patient ID: Debra Richards, female   DOB: 30-May-1925, 87 y.o.   MRN: [id number]

## 2020-10-30 ENCOUNTER — Ambulatory Visit (HOSPITAL_COMMUNITY)
Admission: RE | Admit: 2020-10-30 | Discharge: 2020-10-30 | Disposition: A | Discharge status: Home / Self Care | Payer: Medicare Other | Source: Ambulatory Visit | Attending: Cardiology | Admitting: Cardiology

## 2020-10-30 ENCOUNTER — Other Ambulatory Visit: Payer: Self-pay

## 2020-10-30 DIAGNOSIS — R0602 Shortness of breath: Secondary | ICD-10-CM | POA: Diagnosis not present

## 2020-10-30 LAB — ECHOCARDIOGRAM COMPLETE
Area-P 1/2: 1.88 cm2
S' Lateral: 1.9 cm

## 2020-10-30 NOTE — Progress Notes (Signed)
*  PRELIMINARY RESULTS* Echocardiogram 2D Echocardiogram has been performed.  Debra Richards 10/30/2020, 2:40 PM

## 2020-10-31 ENCOUNTER — Telehealth: Payer: Self-pay

## 2020-10-31 NOTE — Telephone Encounter (Signed)
Pt notified and voiced understanding 

## 2020-10-31 NOTE — Telephone Encounter (Signed)
-----   Message from Satira Sark, MD sent at 10/31/2020  8:02 AM EDT ----- Results reviewed.  LVEF remains normal at 60 to 65% and pulmonary pressure also estimated in normal range.  Would continue with current follow-up plan.

## 2020-11-03 ENCOUNTER — Other Ambulatory Visit: Payer: Self-pay

## 2020-11-03 ENCOUNTER — Ambulatory Visit (HOSPITAL_COMMUNITY)
Admission: RE | Admit: 2020-11-03 | Discharge: 2020-11-03 | Disposition: A | Discharge status: Home / Self Care | Payer: Medicare Other | Source: Ambulatory Visit | Attending: Urology | Admitting: Urology

## 2020-11-03 DIAGNOSIS — N200 Calculus of kidney: Secondary | ICD-10-CM | POA: Insufficient documentation

## 2020-11-03 DIAGNOSIS — N281 Cyst of kidney, acquired: Secondary | ICD-10-CM | POA: Diagnosis not present

## 2020-11-03 DIAGNOSIS — R109 Unspecified abdominal pain: Secondary | ICD-10-CM | POA: Insufficient documentation

## 2020-11-06 ENCOUNTER — Telehealth: Payer: Self-pay

## 2020-11-06 NOTE — Telephone Encounter (Signed)
-----   Message from Irine Seal, MD sent at 11/06/2020  1:23 PM EDT ----- The KUB was read as showing no stones but the renal US shows bilateral renal stones with the largest on each side at 8-59mm.  No hydro is noted and the stones appear stable from CT's dating back to 2017 so they are not likely to be the source of her pain.  It the pain becomes more bothersome, we would need to consider a CT stone study to clarify the location of the stones.   ----- Message ----- From: Dorisann Frames, RN Sent: 11/06/2020  11:06 AM EDT To: Irine Seal, MD  Please review

## 2020-11-06 NOTE — Telephone Encounter (Signed)
Patient called and made aware.

## 2020-11-06 NOTE — Progress Notes (Signed)
Letter sent via mail 

## 2020-12-04 DIAGNOSIS — E7800 Pure hypercholesterolemia, unspecified: Secondary | ICD-10-CM | POA: Diagnosis not present

## 2020-12-04 DIAGNOSIS — E039 Hypothyroidism, unspecified: Secondary | ICD-10-CM | POA: Diagnosis not present

## 2020-12-04 DIAGNOSIS — Z7189 Other specified counseling: Secondary | ICD-10-CM | POA: Diagnosis not present

## 2020-12-04 DIAGNOSIS — Z6822 Body mass index (BMI) 22.0-22.9, adult: Secondary | ICD-10-CM | POA: Diagnosis not present

## 2020-12-04 DIAGNOSIS — Z1331 Encounter for screening for depression: Secondary | ICD-10-CM | POA: Diagnosis not present

## 2020-12-04 DIAGNOSIS — R42 Dizziness and giddiness: Secondary | ICD-10-CM | POA: Diagnosis not present

## 2020-12-04 DIAGNOSIS — Z299 Encounter for prophylactic measures, unspecified: Secondary | ICD-10-CM | POA: Diagnosis not present

## 2020-12-04 DIAGNOSIS — Z1339 Encounter for screening examination for other mental health and behavioral disorders: Secondary | ICD-10-CM | POA: Diagnosis not present

## 2020-12-04 DIAGNOSIS — Z79899 Other long term (current) drug therapy: Secondary | ICD-10-CM | POA: Diagnosis not present

## 2020-12-04 DIAGNOSIS — M545 Low back pain, unspecified: Secondary | ICD-10-CM | POA: Diagnosis not present

## 2020-12-04 DIAGNOSIS — R5383 Other fatigue: Secondary | ICD-10-CM | POA: Diagnosis not present

## 2020-12-04 DIAGNOSIS — Z0000 Encounter for general adult medical examination without abnormal findings: Secondary | ICD-10-CM | POA: Diagnosis not present

## 2020-12-12 DIAGNOSIS — N390 Urinary tract infection, site not specified: Secondary | ICD-10-CM | POA: Diagnosis not present

## 2020-12-12 DIAGNOSIS — Z299 Encounter for prophylactic measures, unspecified: Secondary | ICD-10-CM | POA: Diagnosis not present

## 2020-12-13 ENCOUNTER — Emergency Department (HOSPITAL_COMMUNITY): Payer: Medicare Other

## 2020-12-13 ENCOUNTER — Observation Stay (HOSPITAL_COMMUNITY)
Admission: EM | Admit: 2020-12-13 | Discharge: 2020-12-14 | Disposition: A | Discharge status: Home / Self Care | Payer: Medicare Other | Attending: Family Medicine | Admitting: Family Medicine

## 2020-12-13 ENCOUNTER — Encounter (HOSPITAL_COMMUNITY): Payer: Self-pay | Admitting: Radiology

## 2020-12-13 ENCOUNTER — Other Ambulatory Visit: Payer: Self-pay

## 2020-12-13 DIAGNOSIS — Z7982 Long term (current) use of aspirin: Secondary | ICD-10-CM | POA: Diagnosis not present

## 2020-12-13 DIAGNOSIS — N390 Urinary tract infection, site not specified: Secondary | ICD-10-CM | POA: Diagnosis not present

## 2020-12-13 DIAGNOSIS — Z96641 Presence of right artificial hip joint: Secondary | ICD-10-CM | POA: Diagnosis not present

## 2020-12-13 DIAGNOSIS — R55 Syncope and collapse: Secondary | ICD-10-CM | POA: Diagnosis not present

## 2020-12-13 DIAGNOSIS — R531 Weakness: Secondary | ICD-10-CM | POA: Diagnosis not present

## 2020-12-13 DIAGNOSIS — I959 Hypotension, unspecified: Secondary | ICD-10-CM | POA: Diagnosis not present

## 2020-12-13 DIAGNOSIS — I700 Atherosclerosis of aorta: Secondary | ICD-10-CM | POA: Diagnosis not present

## 2020-12-13 DIAGNOSIS — R001 Bradycardia, unspecified: Secondary | ICD-10-CM | POA: Diagnosis not present

## 2020-12-13 DIAGNOSIS — R0902 Hypoxemia: Secondary | ICD-10-CM | POA: Diagnosis not present

## 2020-12-13 DIAGNOSIS — R350 Frequency of micturition: Secondary | ICD-10-CM | POA: Diagnosis not present

## 2020-12-13 DIAGNOSIS — R5383 Other fatigue: Secondary | ICD-10-CM | POA: Diagnosis not present

## 2020-12-13 DIAGNOSIS — I4891 Unspecified atrial fibrillation: Secondary | ICD-10-CM | POA: Diagnosis not present

## 2020-12-13 DIAGNOSIS — Z96651 Presence of right artificial knee joint: Secondary | ICD-10-CM | POA: Insufficient documentation

## 2020-12-13 DIAGNOSIS — E860 Dehydration: Secondary | ICD-10-CM | POA: Insufficient documentation

## 2020-12-13 DIAGNOSIS — G629 Polyneuropathy, unspecified: Secondary | ICD-10-CM

## 2020-12-13 DIAGNOSIS — E039 Hypothyroidism, unspecified: Secondary | ICD-10-CM | POA: Diagnosis not present

## 2020-12-13 DIAGNOSIS — I2510 Atherosclerotic heart disease of native coronary artery without angina pectoris: Secondary | ICD-10-CM | POA: Insufficient documentation

## 2020-12-13 DIAGNOSIS — K219 Gastro-esophageal reflux disease without esophagitis: Secondary | ICD-10-CM | POA: Diagnosis present

## 2020-12-13 DIAGNOSIS — Z79899 Other long term (current) drug therapy: Secondary | ICD-10-CM | POA: Insufficient documentation

## 2020-12-13 DIAGNOSIS — Z723 Lack of physical exercise: Secondary | ICD-10-CM | POA: Diagnosis not present

## 2020-12-13 DIAGNOSIS — N200 Calculus of kidney: Secondary | ICD-10-CM | POA: Diagnosis not present

## 2020-12-13 DIAGNOSIS — R5381 Other malaise: Secondary | ICD-10-CM

## 2020-12-13 DIAGNOSIS — Z20822 Contact with and (suspected) exposure to covid-19: Secondary | ICD-10-CM | POA: Diagnosis not present

## 2020-12-13 DIAGNOSIS — R42 Dizziness and giddiness: Secondary | ICD-10-CM | POA: Diagnosis not present

## 2020-12-13 DIAGNOSIS — I447 Left bundle-branch block, unspecified: Secondary | ICD-10-CM | POA: Diagnosis not present

## 2020-12-13 DIAGNOSIS — R0689 Other abnormalities of breathing: Secondary | ICD-10-CM | POA: Diagnosis not present

## 2020-12-13 DIAGNOSIS — K5730 Diverticulosis of large intestine without perforation or abscess without bleeding: Secondary | ICD-10-CM | POA: Diagnosis not present

## 2020-12-13 LAB — URINALYSIS, ROUTINE W REFLEX MICROSCOPIC
Bilirubin Urine: NEGATIVE
Glucose, UA: NEGATIVE mg/dL
Hgb urine dipstick: NEGATIVE
Ketones, ur: NEGATIVE mg/dL
Leukocytes,Ua: NEGATIVE
Nitrite: NEGATIVE
Protein, ur: NEGATIVE mg/dL
Specific Gravity, Urine: 1.009 (ref 1.005–1.030)
pH: 7 (ref 5.0–8.0)

## 2020-12-13 LAB — CBC
HCT: 38.5 % (ref 36.0–46.0)
Hemoglobin: 12.7 g/dL (ref 12.0–15.0)
MCH: 33.7 pg (ref 26.0–34.0)
MCHC: 33 g/dL (ref 30.0–36.0)
MCV: 102.1 fL — ABNORMAL HIGH (ref 80.0–100.0)
Platelets: 123 10*3/uL — ABNORMAL LOW (ref 150–400)
RBC: 3.77 MIL/uL — ABNORMAL LOW (ref 3.87–5.11)
RDW: 13.5 % (ref 11.5–15.5)
WBC: 3.5 10*3/uL — ABNORMAL LOW (ref 4.0–10.5)
nRBC: 0 % (ref 0.0–0.2)

## 2020-12-13 LAB — PROTIME-INR
INR: 1 (ref 0.8–1.2)
Prothrombin Time: 13.1 seconds (ref 11.4–15.2)

## 2020-12-13 LAB — HEPATIC FUNCTION PANEL
ALT: 18 U/L (ref 0–44)
AST: 26 U/L (ref 15–41)
Albumin: 3.5 g/dL (ref 3.5–5.0)
Alkaline Phosphatase: 52 U/L (ref 38–126)
Bilirubin, Direct: 0.1 mg/dL (ref 0.0–0.2)
Indirect Bilirubin: 0.5 mg/dL (ref 0.3–0.9)
Total Bilirubin: 0.6 mg/dL (ref 0.3–1.2)
Total Protein: 5.6 g/dL — ABNORMAL LOW (ref 6.5–8.1)

## 2020-12-13 LAB — BASIC METABOLIC PANEL
Anion gap: 6 (ref 5–15)
BUN: 20 mg/dL (ref 8–23)
CO2: 27 mmol/L (ref 22–32)
Calcium: 8.6 mg/dL — ABNORMAL LOW (ref 8.9–10.3)
Chloride: 107 mmol/L (ref 98–111)
Creatinine, Ser: 0.89 mg/dL (ref 0.44–1.00)
GFR, Estimated: >60 mL/min (ref >60)
Glucose, Bld: 82 mg/dL (ref 70–99)
Potassium: 4.1 mmol/L (ref 3.5–5.1)
Sodium: 140 mmol/L (ref 135–145)

## 2020-12-13 LAB — APTT: aPTT: 28 seconds (ref 24–36)

## 2020-12-13 LAB — LACTIC ACID, PLASMA
Lactic Acid, Venous: 1.6 mmol/L (ref 0.5–1.9)
Lactic Acid, Venous: 1.8 mmol/L (ref 0.5–1.9)

## 2020-12-13 LAB — RESP PANEL BY RT-PCR (FLU A&B, COVID) ARPGX2
Influenza A by PCR: NEGATIVE
Influenza B by PCR: NEGATIVE
SARS Coronavirus 2 by RT PCR: NEGATIVE

## 2020-12-13 LAB — CREATININE, SERUM
Creatinine, Ser: 0.81 mg/dL (ref 0.44–1.00)
GFR, Estimated: >60 mL/min (ref >60)

## 2020-12-13 MED ORDER — SODIUM CHLORIDE 0.45 % IV SOLN: INTRAVENOUS | Status: DC

## 2020-12-13 MED ORDER — ACETAMINOPHEN 325 MG PO TABS: 650.0000 mg | ORAL_TABLET | Freq: Four times a day (QID) | ORAL | Status: DC | PRN

## 2020-12-13 MED ORDER — ACETAMINOPHEN 650 MG RE SUPP: 650.0000 mg | Freq: Four times a day (QID) | RECTAL | Status: DC | PRN

## 2020-12-13 MED ORDER — SODIUM CHLORIDE 0.9 % IV SOLN
1.0000 g | Freq: Once | INTRAVENOUS | Status: AC
  Administered 2020-12-13: 1 g via INTRAVENOUS
  Filled 2020-12-13: qty 10

## 2020-12-13 MED ORDER — GABAPENTIN 100 MG PO CAPS
100.0000 mg | ORAL_CAPSULE | Freq: Three times a day (TID) | ORAL | Status: DC
  Administered 2020-12-13 – 2020-12-14 (×2): 100 mg via ORAL
  Filled 2020-12-13 (×2): qty 1

## 2020-12-13 MED ORDER — HEPARIN SODIUM (PORCINE) 5000 UNIT/ML IJ SOLN
5000.0000 [IU] | Freq: Three times a day (TID) | INTRAMUSCULAR | Status: DC
  Administered 2020-12-13 – 2020-12-14 (×2): 5000 [IU] via SUBCUTANEOUS
  Filled 2020-12-13 (×2): qty 1

## 2020-12-13 MED ORDER — PANTOPRAZOLE SODIUM 40 MG PO TBEC
80.0000 mg | DELAYED_RELEASE_TABLET | Freq: Every day | ORAL | Status: DC
  Administered 2020-12-13 – 2020-12-14 (×2): 80 mg via ORAL
  Filled 2020-12-13 (×2): qty 2

## 2020-12-13 MED ORDER — LEVOTHYROXINE SODIUM 75 MCG PO TABS
75.0000 ug | ORAL_TABLET | Freq: Every day | ORAL | Status: DC
  Administered 2020-12-14: 75 ug via ORAL
  Filled 2020-12-13: qty 1

## 2020-12-13 MED ORDER — LACTATED RINGERS IV BOLUS
1800.0000 mL | Freq: Once | INTRAVENOUS | Status: AC
  Administered 2020-12-13: 1800 mL via INTRAVENOUS

## 2020-12-13 MED ORDER — SODIUM CHLORIDE 0.9 % IV BOLUS
500.0000 mL | Freq: Once | INTRAVENOUS | Status: AC
  Administered 2020-12-13: 500 mL via INTRAVENOUS

## 2020-12-13 NOTE — Sepsis Progress Note (Signed)
eLink is monitoring this Code Sepsis. °

## 2020-12-13 NOTE — ED Provider Notes (Signed)
[facility] EMERGENCY DEPARTMENT Provider Note   CSN: [id number] Arrival date & time: 12/13/20  1134     History Chief Complaint  Patient presents with   Fatigue    Debra Richards is a 87 y.o. female.  Pt presents to the ED today with a syncopal event.  Pt went to her doctor yesterday and was diagnosed with a UTI.  She took her first dose of Cipro this am around 10:30.  She had a syncopal event which was witnessed by her daughter.  Daughter said she started sweating profusely and then passed out while sitting.  Initial BP by EMS was 61/37.  She was given 400 cc NS in route.  BP has improved, but it is still soft.  She said she is feeling better now.      Past Medical History:  Diagnosis Date   Complication of anesthesia    Trouble waking up   GERD (gastroesophageal reflux disease)    History of kidney stones    Hypothyroidism    LBBB (left bundle branch block)    MVP (mitral valve prolapse)    Osteoporosis    Palpitations    RA (rheumatoid arthritis) ([place])    Right ureteral stone    Rosacea     Patient Active Problem List   Diagnosis Date Noted   Syncope 12/13/2020   Herniated lumbar disc without myelopathy 12/28/2019   Weakness 02/15/2016   Acute parotitis 02/15/2016   Atrial fibrillation ([place]) 02/15/2016   Hypokalemia 02/15/2016   Atypical chest pain 10/27/2013   Anxiety 10/27/2013   Pectus excavatum 04/25/2013   UTI (lower urinary tract infection) 02/28/2013   Hypotension 02/28/2013   Acute renal failure ([place]) 02/28/2013   Generalized weakness 02/28/2013   Thrombocytopenia ([place]) 02/28/2013   Acute encephalopathy 02/28/2013   Altered mental status 10/14/2012   Hip fracture, right ([place]) 10/12/2012   Altered awareness, transient 03/25/2012   Delirium 03/25/2012   Leukocytosis 03/25/2012   Postop Acute blood loss anemia 03/24/2012   OA (osteoarthritis) of knee 03/23/2012   Right ovarian cyst    MVP (mitral valve prolapse)    Coronary artery disease     GERD (gastroesophageal reflux disease)    History of kidney stones    RA (rheumatoid arthritis) ([place])    Dysrhythmia    Lateral meniscal tear    Rosacea    Right ureteral stone    LBBB (left bundle branch block)    Edema    Hypothyroidism    GERD 01/22/2010   CONSTIPATION 01/22/2010   DYSPHAGIA UNSPECIFIED 01/22/2010   ABDOMINAL PAIN RIGHT LOWER QUADRANT 01/22/2010    Past Surgical History:  Procedure Laterality Date   BACK SURGERY     CATARACT EXTRACTION W/ INTRAOCULAR LENS  IMPLANT, BILATERAL  4 yrs ago   both eyes   CHOLECYSTECTOMY  2005   EXTRACORPOREAL SHOCK WAVE LITHOTRIPSY  09-24-10   and 1990   Femur fx     HIP ARTHROPLASTY Right 10/12/2012   Procedure: ARTHROPLASTY BIPOLAR HIP;  Surgeon: Gearlean Alf, MD;  Location: [facility];  Service: Orthopedics;  Laterality: Right;   KNEE ARTHROSCOPY  04/17/2011, right   Procedure: ARTHROSCOPY KNEE;  Surgeon: Gearlean Alf;  Location: [place];  Service: Orthopedics;  Laterality: Right;  WITH LATERAL MENISCAL DEBRIDEMENT   LUMBAR LAMINECTOMY/DECOMPRESSION MICRODISCECTOMY Right 12/28/2019   Procedure: Right Lumbar Four-Lumbar Five Microdiscectomy;  Surgeon: Erline Levine, MD;  Location: [place];  Service: Neurosurgery;  Laterality: Right;  Right Lumbar Four-Lumbar  Five Microdiscectomy   MOLES EXCISED  2012   on nose   Sternum Fx     TOTAL KNEE ARTHROPLASTY  03/23/2012   Procedure: TOTAL KNEE ARTHROPLASTY;  Surgeon: Gearlean Alf, MD;  Location: [facility];  Service: Orthopedics;  Laterality: Right;   VAGINAL HYSTERECTOMY  1970   Leiomyomata     OB History     Gravida  2   Para  2   Term  2   Preterm      AB      Living  2      SAB      IAB      Ectopic      Multiple      Live Births              Family History  Problem Relation Age of Onset   Cancer Mother        COLON   Heart disease Father        STROKE   Stroke Father    Ovarian cancer Maternal Grandmother    Crohn's disease  Daughter     Social History   Tobacco Use   Smoking status: Never   Smokeless tobacco: Never  Vaping Use   Vaping Use: Never used  Substance Use Topics   Alcohol use: No    Alcohol/week: 0.0 standard drinks   Drug use: No    Home Medications Prior to Admission medications   Medication Sig Start Date End Date Taking? Authorizing Provider  aspirin EC 81 MG tablet Take 81 mg by mouth daily. Swallow whole.   Yes [provider]  cholecalciferol (VITAMIN D) 1000 units tablet Take 1,000 Units by mouth daily.    Yes [provider]  ciprofloxacin (CIPRO) 500 MG tablet Take 500 mg by mouth 2 (two) times daily. 12/12/20  Yes [provider]  gabapentin (NEURONTIN) 100 MG capsule Take 100 mg by mouth 3 (three) times daily. 09/28/20  Yes [provider]  levothyroxine (SYNTHROID, LEVOTHROID) 75 MCG tablet Take 75 mcg by mouth daily before breakfast.   Yes [provider]  meclizine (ANTIVERT) 25 MG tablet Take 25 mg by mouth 2 (two) times daily as needed for dizziness. 06/21/20  Yes [provider]  omeprazole (PRILOSEC) 40 MG capsule Take 40 mg by mouth 3 (three) times a week.  09/15/19  Yes [provider]  vitamin E 400 UNIT capsule Take 400 Units by mouth daily.    Yes [provider]  mirabegron ER (MYRBETRIQ) 50 MG TB24 tablet Take 1 tablet (50 mg total) by mouth daily. Patient not taking: No sig reported 02/25/20   Irine Seal, MD    Allergies    Penicillins, Contrast media [iodinated diagnostic agents], and Levaquin [levofloxacin in d5w]  Review of Systems   Review of Systems  Neurological:  Positive for syncope.  All other systems reviewed and are negative.  Physical Exam Updated Vital Signs BP (!) 124/57   Pulse 60   Temp 97.7 F (36.5 C)   Resp (!) 23   Ht 5\' 5"  (1.651 m)   Wt 59 kg   SpO2 99%   BMI 21.63 kg/m   Physical Exam Vitals and nursing note reviewed.  Constitutional:      Appearance:  Normal appearance.  HENT:     Head: Normocephalic and atraumatic.     Right Ear: External ear normal.     Left Ear: External ear normal.     Nose:  Nose normal.     Mouth/Throat:     Mouth: Mucous membranes are moist.     Pharynx: Oropharynx is clear.  Eyes:     Extraocular Movements: Extraocular movements intact.     Conjunctiva/sclera: Conjunctivae normal.     Pupils: Pupils are equal, round, and reactive to light.  Cardiovascular:     Rate and Rhythm: Normal rate and regular rhythm.     Pulses: Normal pulses.     Heart sounds: Normal heart sounds.  Pulmonary:     Effort: Pulmonary effort is normal.     Breath sounds: Normal breath sounds.  Abdominal:     General: Abdomen is flat. Bowel sounds are normal.     Palpations: Abdomen is soft.  Musculoskeletal:        General: Normal range of motion.     Cervical back: Normal range of motion and neck supple.  Skin:    General: Skin is warm.     Capillary Refill: Capillary refill takes less than 2 seconds.  Neurological:     General: No focal deficit present.     Mental Status: She is alert and oriented to person, place, and time.  Psychiatric:        Mood and Affect: Mood normal.        Behavior: Behavior normal.        Thought Content: Thought content normal.        Judgment: Judgment normal.    ED Results / Procedures / Treatments   Labs (all labs ordered are listed, but only abnormal results are displayed) Labs Reviewed  BASIC METABOLIC PANEL - Abnormal; Notable for the following components:      Result Value   Calcium 8.6 (*)    All other components within normal limits  CBC - Abnormal; Notable for the following components:   WBC 3.5 (*)    RBC 3.77 (*)    MCV 102.1 (*)    Platelets 123 (*)    All other components within normal limits  HEPATIC FUNCTION PANEL - Abnormal; Notable for the following components:   Total Protein 5.6 (*)    All other components within normal limits  CULTURE, BLOOD (ROUTINE X 2)   CULTURE, BLOOD (ROUTINE X 2)  RESP PANEL BY RT-PCR (FLU A&B, COVID) ARPGX2  URINE CULTURE  URINALYSIS, ROUTINE W REFLEX MICROSCOPIC  LACTIC ACID, PLASMA  LACTIC ACID, PLASMA  PROTIME-INR  APTT    EKG EKG Interpretation  Date/Time:  Wednesday December 13 2020 14:29:40 EDT Ventricular Rate:  63 PR Interval:  218 QRS Duration: 139 QT Interval:  503 QTC Calculation: 515 R Axis:   -6 Text Interpretation: Sinus rhythm Borderline prolonged PR interval Left bundle branch block Since last tracing rate slower Otherwise no significant change Confirmed by Daleen Bo [phone]) on 12/13/2020 2:45:37 PM  Radiology DG Chest Port 1 View  Result Date: 12/13/2020 CLINICAL DATA:  Weakness, fatigue, possible sepsis EXAM: PORTABLE CHEST 1 VIEW COMPARISON:  09/15/2020 FINDINGS: Single frontal view of the chest demonstrates an unremarkable cardiac silhouette. Chronic scarring throughout the lungs without airspace disease, effusion, or pneumothorax. No acute bony abnormalities. IMPRESSION: 1. Chronic scarring consistent with emphysema. No acute airspace disease. Electronically Signed   By: Randa Ngo M.D.   On: 12/13/2020 15:59   CT Renal Stone Study  Result Date: 12/13/2020 CLINICAL DATA:  Flank pain.  Urinary frequency. EXAM: CT ABDOMEN AND PELVIS WITHOUT CONTRAST TECHNIQUE: Multidetector CT imaging of the abdomen and pelvis was performed following the  standard protocol without IV contrast. COMPARISON:  02/15/2016 FINDINGS: Lower chest: No acute findings. Hepatobiliary: No mass visualized on this unenhanced exam. Prior cholecystectomy. No evidence of biliary obstruction. Pancreas: No mass or inflammatory process visualized on this unenhanced exam. Spleen:  Within normal limits in size. Adrenals/Urinary tract: Multiple small renal calculi are again seen bilaterally, with largest in the right kidney measuring 6 mm. No evidence of ureteral calculi or hydronephrosis. Unremarkable unopacified urinary bladder.  Stomach/Bowel: No evidence of obstruction, inflammatory process, or abnormal fluid collections. Normal appendix visualized. Diverticulosis is seen mainly involving the sigmoid colon, however there is no evidence of diverticulitis. Vascular/Lymphatic: No pathologically enlarged lymph nodes identified. No evidence of abdominal aortic aneurysm. Aortic atherosclerotic calcification noted. Reproductive: Prior hysterectomy noted. Adnexal regions are unremarkable in appearance. Other:  None. Musculoskeletal: No suspicious bone lesions identified. Right hip prosthesis again noted. IMPRESSION: Bilateral nephrolithiasis. No evidence of ureteral calculi, hydronephrosis, or other acute findings. Colonic diverticulosis. No radiographic evidence of diverticulitis. Aortic Atherosclerosis (ICD10-I70.0). Electronically Signed   By: Marlaine Hind M.D.   On: 12/13/2020 17:38    Procedures Procedures   Medications Ordered in ED Medications  sodium chloride 0.9 % bolus 500 mL (0 mLs Intravenous Stopped 12/13/20 1558)  lactated ringers bolus 1,800 mL (1,800 mLs Intravenous New Bag/Given 12/13/20 1558)  cefTRIAXone (ROCEPHIN) 1 g in sodium chloride 0.9 % 100 mL IVPB (0 g Intravenous Stopped 12/13/20 1558)    ED Course  I have reviewed the triage vital signs and the nursing notes.  Pertinent labs & imaging results that were available during my care of the patient were reviewed by me and considered in my medical decision making (see chart for details).    MDM Rules/Calculators/A&P                           Code sepsis called.  Pt given sepsis fluids and rocephin for likely UTI.  UA looks normal.  Pt has only had one dose of cipro, but c/o dysuria and frequency.  Urine sent for cultures.  Blood cultures have also been sent.  Pt's bp is much improved and she feels much better.  She may just be dehydrated.  She is d/w Dr. Marily Memos (triad) for admission.  CRITICAL CARE Performed by: Isla Pence   Total critical  care time: 30 minutes  Critical care time was exclusive of separately billable procedures and treating other patients.  Critical care was necessary to treat or prevent imminent or life-threatening deterioration.  Critical care was time spent personally by me on the following activities: development of treatment plan with patient and/or surrogate as well as nursing, discussions with consultants, evaluation of patient's response to treatment, examination of patient, obtaining history from patient or surrogate, ordering and performing treatments and interventions, ordering and review of laboratory studies, ordering and review of radiographic studies, pulse oximetry and re-evaluation of patient's condition.   Final Clinical Impression(s) / ED Diagnoses Final diagnoses:  Dehydration  Hypotension, unspecified hypotension type    Rx / DC Orders ED Discharge Orders     None        Isla Pence, MD 12/13/20 [phone]

## 2020-12-13 NOTE — ED Notes (Signed)
Pt put on 12 lead monitor

## 2020-12-13 NOTE — H&P (Signed)
History and Physical    Bekah Igoe RAQ:[id number] DOB: 04-18-1926 DOA: 12/13/2020  PCP: Glenda Chroman, MD Patient coming from: home  Chief Complaint: syncope  HPI: Debra Richards is a 87 y.o. female with medical history significant of GERD, Nephrolithiasis, Hypothyroid, LBBB, RA, S/p Hip and Spinal surgery. Presenting after single syncopal episode. Hisotry obtained from [place], Pt and Pts daughter. Pt was in her usual state of health until 3-4 days ago when she developed lower abd pain, dysuria, frequency. States these are her usual UTI sx. Day prior to admission pt noted dark colored urine so she went to her PCP. Dx w/ UTI and started on Cipro. Did not take Cipro until the following morning (day of admission) Pt had her dauther over to the house and had just finished breakfast and took her first dose of Cipro. After getting up and ambulating to her home office she passed out. Pt denies any preceding HA, CP, palpitations, lightheadedness, n/v. Episode witnessd by pts daughter. Did not strike head. Pt woke up shortly after passin gout and endorses profuse diaphoresis. No cognitive delay or focal deficit. EMS called and pt noted to be hypotensive w/ SBP in the 60s. 400cc IVF given by EMS w/ improvement.     ED Course: labs and CT scan obtained. EKG w/o arrhythmia. After multiple liters of lfuid pt BP at baseline and feeling like herself.   Review of Systems: As per HPI otherwise all other systems reviewed and are negative  Ambulatory Status:no restrictions.   Past Medical History:  Diagnosis Date   Complication of anesthesia    Trouble waking up   GERD (gastroesophageal reflux disease)    History of kidney stones    Hypothyroidism    LBBB (left bundle branch block)    MVP (mitral valve prolapse)    Osteoporosis    Palpitations    RA (rheumatoid arthritis) ([place])    Right ureteral stone    Rosacea     Past Surgical History:  Procedure Laterality Date   BACK SURGERY     CATARACT  EXTRACTION W/ INTRAOCULAR LENS  IMPLANT, BILATERAL  4 yrs ago   both eyes   CHOLECYSTECTOMY  2005   EXTRACORPOREAL SHOCK WAVE LITHOTRIPSY  09-24-10   and 1990   Femur fx     HIP ARTHROPLASTY Right 10/12/2012   Procedure: ARTHROPLASTY BIPOLAR HIP;  Surgeon: Gearlean Alf, MD;  Location: [facility];  Service: Orthopedics;  Laterality: Right;   KNEE ARTHROSCOPY  04/17/2011, right   Procedure: ARTHROSCOPY KNEE;  Surgeon: Gearlean Alf;  Location: [facility];  Service: Orthopedics;  Laterality: Right;  WITH LATERAL MENISCAL DEBRIDEMENT   LUMBAR LAMINECTOMY/DECOMPRESSION MICRODISCECTOMY Right 12/28/2019   Procedure: Right Lumbar Four-Lumbar Five Microdiscectomy;  Surgeon: Erline Levine, MD;  Location: [facility];  Service: Neurosurgery;  Laterality: Right;  Right Lumbar Four-Lumbar Five Microdiscectomy   MOLES EXCISED  2012   on nose   [place] Fx     TOTAL KNEE ARTHROPLASTY  03/23/2012   Procedure: TOTAL KNEE ARTHROPLASTY;  Surgeon: Gearlean Alf, MD;  Location: [facility];  Service: Orthopedics;  Laterality: Right;   VAGINAL HYSTERECTOMY  1970   Leiomyomata    Social History   Socioeconomic History   Marital status: Widowed    Spouse name: Not on file   Number of children: Not on file   Years of education: Not on file   Highest education level: Not on file  Occupational History   Not on file  Tobacco Use   Smoking status: Never   Smokeless tobacco: Never  Vaping Use   Vaping Use: Never used  Substance and Sexual Activity   Alcohol use: No    Alcohol/week: 0.0 standard drinks   Drug use: No   Sexual activity: Not Currently    Birth control/protection: Post-menopausal, Surgical    Comment: 1st intercourse 50 yo-2 partners  Other Topics Concern   Not on file  Social History Narrative   Lives in [place].   Lives c 2nd husband    NOK-Gina Axson   Social Determinants of Health   Financial Resource Strain: Not on file  Food Insecurity: Not on file  Transportation Needs:  Not on file  Physical Activity: Not on file  Stress: Not on file  Social Connections: Not on file  Intimate Partner Violence: Not on file    Allergies  Allergen Reactions   Penicillins Hives    03/04/13: Pt has tolerated Amoxicillin/Unsyn without reaction   Contrast Media [Iodinated Diagnostic Agents] Other (See Comments)    Arm swelled up and was painful after IVP years ago/    Levaquin [Levofloxacin In D5w] Other (See Comments)    Made her sick all over    Family History  Problem Relation Age of Onset   Cancer Mother        COLON   Heart disease Father        STROKE   Stroke Father    Ovarian cancer Maternal Grandmother    Crohn's disease Daughter       Prior to Admission medications   Medication Sig Start Date End Date Taking? Authorizing Provider  aspirin EC 81 MG tablet Take 81 mg by mouth daily. Swallow whole.   Yes [provider]  cholecalciferol (VITAMIN D) 1000 units tablet Take 1,000 Units by mouth daily.    Yes [provider]  ciprofloxacin (CIPRO) 500 MG tablet Take 500 mg by mouth 2 (two) times daily. 12/12/20  Yes [provider]  gabapentin (NEURONTIN) 100 MG capsule Take 100 mg by mouth 3 (three) times daily. 09/28/20  Yes [provider]  levothyroxine (SYNTHROID, LEVOTHROID) 75 MCG tablet Take 75 mcg by mouth daily before breakfast.   Yes [provider]  meclizine (ANTIVERT) 25 MG tablet Take 25 mg by mouth 2 (two) times daily as needed for dizziness. 06/21/20  Yes [provider]  omeprazole (PRILOSEC) 40 MG capsule Take 40 mg by mouth 3 (three) times a week.  09/15/19  Yes [provider]  vitamin E 400 UNIT capsule Take 400 Units by mouth daily.    Yes [provider]  mirabegron ER (MYRBETRIQ) 50 MG TB24 tablet Take 1 tablet (50 mg total) by mouth daily. Patient not taking: No sig reported 02/25/20   Irine Seal, MD    Physical Exam: Vitals:   12/13/20 1430 12/13/20 1500 12/13/20 1635  12/13/20 1730  BP: 90/68 (!) 101/52 124/68 (!) 124/57  Pulse:  60 71 60  Resp: (!) 25 13 20  (!) 23  Temp:      SpO2:  97% 96% 99%  Weight:      Height:         General:  Appears calm and comfortable Eyes:  PERRL, EOMI, normal lids, iris ENT:  grossly normal hearing, lips & tongue, mmm Neck:  no LAD, masses or thyromegaly Cardiovascular:  RRR, no m/r/g. No LE edema.  Respiratory:  CTA bilaterally, no w/r/r. Normal respiratory effort. Abdomen:  soft, ntnd, NABS Skin:  no rash or induration seen on limited exam Musculoskeletal:  grossly normal tone BUE/BLE, good ROM, no bony abnormality Psychiatric:  grossly normal mood and affect, speech fluent and appropriate, AOx3 Neurologic:  CN 2-12 grossly intact, moves all extremities in coordinated fashion, sensation intact  Labs on Admission: I have personally reviewed following labs and imaging studies  CBC: Recent Labs  Lab 12/13/20 1200  WBC 3.5*  HGB 12.7  HCT 38.5  MCV 102.1*  PLT 762*   Basic Metabolic Panel: Recent Labs  Lab 12/13/20 1200  NA 140  K 4.1  CL 107  CO2 27  GLUCOSE 82  BUN 20  CREATININE 0.89  CALCIUM 8.6*   GFR: Estimated Creatinine Clearance: 36.3 mL/min (by C-G formula based on SCr of 0.89 mg/dL). Liver Function Tests: Recent Labs  Lab 12/13/20 1200  AST 26  ALT 18  ALKPHOS 52  BILITOT 0.6  PROT 5.6*  ALBUMIN 3.5   No results for input(s): LIPASE, AMYLASE in the last 168 hours. No results for input(s): AMMONIA in the last 168 hours. Coagulation Profile: Recent Labs  Lab 12/13/20 1200  INR 1.0   Cardiac Enzymes: No results for input(s): CKTOTAL, CKMB, CKMBINDEX, TROPONINI in the last 168 hours. BNP (last 3 results) No results for input(s): PROBNP in the last 8760 hours. HbA1C: No results for input(s): HGBA1C in the last 72 hours. CBG: No results for input(s): GLUCAP in the last 168 hours. Lipid Profile: No results for input(s): CHOL, HDL, LDLCALC, TRIG, CHOLHDL, LDLDIRECT in the  last 72 hours. Thyroid Function Tests: No results for input(s): TSH, T4TOTAL, FREET4, T3FREE, THYROIDAB in the last 72 hours. Anemia Panel: No results for input(s): VITAMINB12, FOLATE, FERRITIN, TIBC, IRON, RETICCTPCT in the last 72 hours. Urine analysis:    Component Value Date/Time   COLORURINE YELLOW 12/13/2020 1525   APPEARANCEUR CLEAR 12/13/2020 1525   APPEARANCEUR Clear 10/26/2020 1114   LABSPEC 1.009 12/13/2020 1525   PHURINE 7.0 12/13/2020 1525   GLUCOSEU NEGATIVE 12/13/2020 1525   HGBUR NEGATIVE 12/13/2020 1525   BILIRUBINUR NEGATIVE 12/13/2020 1525   BILIRUBINUR Negative 10/26/2020 1114   KETONESUR NEGATIVE 12/13/2020 1525   PROTEINUR NEGATIVE 12/13/2020 1525   UROBILINOGEN 1.0 01/01/2015 2140   NITRITE NEGATIVE 12/13/2020 1525   LEUKOCYTESUR NEGATIVE 12/13/2020 1525    Creatinine Clearance: Estimated Creatinine Clearance: 36.3 mL/min (by C-G formula based on SCr of 0.89 mg/dL).  Sepsis Labs: @LABRCNTIP (procalcitonin:4,lacticidven:4) ) Recent Results (from the past 240 hour(s))  Culture, blood (routine x 2)     Status: None (Preliminary result)   Collection Time: 12/13/20  3:17 PM   Specimen: BLOOD LEFT HAND  Result Value Ref Range Status   Specimen Description   Final    BLOOD LEFT HAND BOTTLES DRAWN AEROBIC AND ANAEROBIC   Special Requests   Final    Blood Culture adequate volume Performed at [facility], [address] [place], [place] [address]    Culture  Setup Time PENDING  Incomplete   Culture PENDING  Incomplete   Report Status PENDING  Incomplete  Culture, blood (routine x 2)     Status: None (Preliminary result)   Collection Time: 12/13/20  3:17 PM   Specimen: BLOOD RIGHT ARM  Result Value Ref Range Status   Specimen Description   Final    BLOOD RIGHT ARM BOTTLES DRAWN AEROBIC AND ANAEROBIC   Special Requests   Final    Blood Culture adequate volume Performed at [facility], [address], [place], [place] [address]  Culture PENDING   Incomplete   Report Status PENDING  Incomplete  Resp Panel by RT-PCR (Flu A&B, Covid) Nasopharyngeal Swab     Status: None   Collection Time: 12/13/20  3:37 PM   Specimen: Nasopharyngeal Swab; Nasopharyngeal(NP) swabs in vial transport medium  Result Value Ref Range Status   SARS Coronavirus 2 by RT PCR NEGATIVE NEGATIVE Final    Comment: (NOTE) SARS-CoV-2 target nucleic acids are NOT DETECTED.  The SARS-CoV-2 RNA is generally detectable in upper respiratory specimens during the acute phase of infection. The lowest concentration of SARS-CoV-2 viral copies this assay can detect is 138 copies/mL. A negative result does not preclude SARS-Cov-2 infection and should not be used as the sole basis for treatment or other patient management decisions. A negative result may occur with  improper specimen collection/handling, submission of specimen other than nasopharyngeal swab, presence of viral mutation(s) within the areas targeted by this assay, and inadequate number of viral copies(<138 copies/mL). A negative result must be combined with clinical observations, patient history, and epidemiological information. The expected result is Negative.  Fact Sheet for Patients:  EntrepreneurPulse.com.au  Fact Sheet for Healthcare Providers:  IncredibleEmployment.be  This test is no t yet approved or cleared by the [place] FDA and  has been authorized for detection and/or diagnosis of SARS-CoV-2 by FDA under an Emergency Use Authorization (EUA). This EUA will remain  in effect (meaning this test can be used) for the duration of the COVID-19 declaration under Section 564(b)(1) of the Act, 21 U.S.C.section 360bbb-3(b)(1), unless the authorization is terminated  or revoked sooner.       Influenza A by PCR NEGATIVE NEGATIVE Final   Influenza B by PCR NEGATIVE NEGATIVE Final    Comment: (NOTE) The Xpert Xpress SARS-CoV-2/FLU/RSV plus assay is intended as an  aid in the diagnosis of influenza from Nasopharyngeal swab specimens and should not be used as a sole basis for treatment. Nasal washings and aspirates are unacceptable for Xpert Xpress SARS-CoV-2/FLU/RSV testing.  Fact Sheet for Patients: EntrepreneurPulse.com.au  Fact Sheet for Healthcare Providers: IncredibleEmployment.be  This test is not yet approved or cleared by the [place] FDA and has been authorized for detection and/or diagnosis of SARS-CoV-2 by FDA under an Emergency Use Authorization (EUA). This EUA will remain in effect (meaning this test can be used) for the duration of the COVID-19 declaration under Section 564(b)(1) of the Act, 21 U.S.C. section 360bbb-3(b)(1), unless the authorization is terminated or revoked.  Performed at [facility], [address], [place], [place] [address]      Radiological Exams on Admission: DG Chest [facility] 1 View  Result Date: 12/13/2020 CLINICAL DATA:  Weakness, fatigue, possible sepsis EXAM: PORTABLE CHEST 1 VIEW COMPARISON:  09/15/2020 FINDINGS: Single frontal view of the chest demonstrates an unremarkable cardiac silhouette. Chronic scarring throughout the lungs without airspace disease, effusion, or pneumothorax. No acute bony abnormalities. IMPRESSION: 1. Chronic scarring consistent with emphysema. No acute airspace disease. Electronically Signed   By: Randa Ngo M.D.   On: 12/13/2020 15:59   CT Renal Stone Study  Result Date: 12/13/2020 CLINICAL DATA:  Flank pain.  Urinary frequency. EXAM: CT ABDOMEN AND PELVIS WITHOUT CONTRAST TECHNIQUE: Multidetector CT imaging of the abdomen and pelvis was performed following the standard protocol without IV contrast. COMPARISON:  02/15/2016 FINDINGS: Lower chest: No acute findings. Hepatobiliary: No mass visualized on this unenhanced exam. Prior cholecystectomy. No evidence of biliary obstruction. Pancreas: No mass or inflammatory process visualized on this  unenhanced exam. Spleen:  Within normal limits in size. Adrenals/Urinary tract: Multiple small renal calculi are again seen bilaterally, with largest in the right kidney measuring 6 mm. No evidence of ureteral calculi or hydronephrosis. Unremarkable unopacified urinary bladder. Stomach/Bowel: No evidence of obstruction, inflammatory process, or abnormal fluid collections. Normal appendix visualized. Diverticulosis is seen mainly involving the sigmoid colon, however there is no evidence of diverticulitis. Vascular/Lymphatic: No pathologically enlarged lymph nodes identified. No evidence of abdominal aortic aneurysm. Aortic atherosclerotic calcification noted. Reproductive: Prior hysterectomy noted. Adnexal regions are unremarkable in appearance. Other:  None. Musculoskeletal: No suspicious bone lesions identified. Right hip prosthesis again noted. IMPRESSION: Bilateral nephrolithiasis. No evidence of ureteral calculi, hydronephrosis, or other acute findings. Colonic diverticulosis. No radiographic evidence of diverticulitis. Aortic Atherosclerosis (ICD10-I70.0). Electronically Signed   By: Marlaine Hind M.D.   On: 12/13/2020 17:38    EKG: Independently reviewed. LBBB, sinus, No ACS  Assessment/Plan Principal Problem:   Syncope Active Problems:   GERD   LBBB (left bundle branch block)   Hypothyroidism   Acute lower UTI   Hypotension   Physical deconditioning   Neuropathy   Nephrolithiasis     Syncope: likely combination of dehydration, physical deconditioning due to age and recent illness, and vagal episode. Doubt cardiac, CNS process. No sign of sepsis or overt systemic infection. EKG unchanged form prior. Nearly 8 hrs in ED on Tele w/o event.  - IVF - Ambulate in am - orthostatic VS in am - repeat EKG in am.   Hypotension: suspect secondary to hypovolemia and vagal episode. Responded well to IVF. No further episodes.  - Routine VS  UTI: Urine clear on UA. [place] sent. Pt took Cipro prior to  ED evaluation so UCX and [place] will most certainly be negative.  - Ceftriaxone - Resume cipro on DC.  Physical deconditioning: Endorsing continued and gradual loss of strength. Suspect age related and nutrition related.  - Prealbumin - Pt would do well for outpt rehab program  Nephrolithiasis: 15+ over lifetime. Currently w/o obstructive stone. CT showing R renal stone.  - Monitor  GERD: - continue PPI  Hypothyroid: - continue synthroid.   Neuropathy: Age related nerve degeneration and from arthropathy - continue Gabapentin.     DVT prophylaxis: hep  Code Status: full  Family Communication: daughter - aware of planned pick up in am.  Disposition Plan: DC in am  Consults called: none  Admission status: obs.     Waldemar Dickens MD [facility]  If 7PM-7AM, please contact night-coverage [email or web address] Password [facility]  12/13/2020, 6:48 PM

## 2020-12-13 NOTE — ED Provider Notes (Signed)
Emergency Medicine Provider Triage Evaluation Note  Debra Richards , a 87 y.o. female  was evaluated in triage.  Pt complains of weakness and diaphoresis.  Review of Systems  Positive: Urinary frequency Negative: No fever  Physical Exam  BP (!) 102/50   Pulse 60   Temp 97.7 F (36.5 C)   Resp (!) 24   Ht 5\' 5"  (1.651 m)   Wt 59 kg   SpO2 100%   BMI 21.63 kg/m  Gen:   Awake, no distress   Resp:  Normal effort  MSK:   Moves extremities without difficulty  Other:  Lucid, blood pressure low  Medical Decision Making  Medically screening exam initiated at 2:55 PM.  Appropriate orders placed.  Debra Richards was informed that the remainder of the evaluation will be completed by another provider, this initial triage assessment does not replace that evaluation, and the importance of remaining in the ED until their evaluation is complete.     Debra Bo, MD 12/13/20 [phone]

## 2020-12-13 NOTE — ED Notes (Signed)
Checked urine canister

## 2020-12-13 NOTE — ED Triage Notes (Addendum)
Pt bib ems for lethargy.  Diaphoretic on ems arrival.  Bgl 110.  Dx yesterday dx with uti at pcp and started on cipro..  Hypotensive on ems arrival.  61/37.  500 ml bolus given by ems.  20ga to l ac.  Pt alert at present.  Resp even and unlabored.  Denies diarrhea.  Reports nausea.  Reports feels weak and tired.

## 2020-12-14 ENCOUNTER — Encounter (HOSPITAL_COMMUNITY): Payer: Self-pay | Admitting: Family Medicine

## 2020-12-14 DIAGNOSIS — K219 Gastro-esophageal reflux disease without esophagitis: Secondary | ICD-10-CM

## 2020-12-14 DIAGNOSIS — N390 Urinary tract infection, site not specified: Secondary | ICD-10-CM

## 2020-12-14 DIAGNOSIS — E861 Hypovolemia: Secondary | ICD-10-CM | POA: Diagnosis not present

## 2020-12-14 DIAGNOSIS — I9589 Other hypotension: Secondary | ICD-10-CM

## 2020-12-14 DIAGNOSIS — R55 Syncope and collapse: Secondary | ICD-10-CM

## 2020-12-14 LAB — BASIC METABOLIC PANEL
Anion gap: 7 (ref 5–15)
BUN: 14 mg/dL (ref 8–23)
CO2: 24 mmol/L (ref 22–32)
Calcium: 7.8 mg/dL — ABNORMAL LOW (ref 8.9–10.3)
Chloride: 107 mmol/L (ref 98–111)
Creatinine, Ser: 0.75 mg/dL (ref 0.44–1.00)
GFR, Estimated: >60 mL/min (ref >60)
Glucose, Bld: 85 mg/dL (ref 70–99)
Potassium: 3.5 mmol/L (ref 3.5–5.1)
Sodium: 138 mmol/L (ref 135–145)

## 2020-12-14 LAB — PREALBUMIN: Prealbumin: 15 mg/dL — ABNORMAL LOW (ref 18–38)

## 2020-12-14 LAB — GLUCOSE, CAPILLARY: Glucose-Capillary: 85 mg/dL (ref 70–99)

## 2020-12-14 NOTE — Evaluation (Signed)
Physical Therapy Evaluation Patient Details Name: Debra Richards MRN: [id number] DOB: 1926-02-15 Today's Date: 12/14/2020   History of Present Illness  Debra Richards is a 87 y.o. female with medical history significant of GERD, Nephrolithiasis, Hypothyroid, LBBB, RA, S/p Hip and Spinal surgery. Presenting after single syncopal episode. Hisotry obtained from [place], Pt and Pts daughter. Pt was in her usual state of health until 3-4 days ago when she developed lower abd pain, dysuria, frequency. States these are her usual UTI sx. Day prior to admission pt noted dark colored urine so she went to her PCP. Dx w/ UTI and started on Cipro. Did not take Cipro until the following morning (day of admission) Pt had her dauther over to the house and had just finished breakfast and took her first dose of Cipro. After getting up and ambulating to her home office she passed out. Pt denies any preceding HA, CP, palpitations, lightheadedness, n/v. Episode witnessd by pts daughter. Did not strike head. Pt woke up shortly after passin gout and endorses profuse diaphoresis. No cognitive delay or focal deficit. EMS called and pt noted to be hypotensive w/ SBP in the 60s. 400cc IVF given by EMS w/ improvement.   Clinical Impression  Patient demonstrates slow labored movement for sitting up at bedside requiring Min guard assist to pull herself to sitting, unsteady on feet and has to lean on armrest of BSC during transfers and required use of RW for safety for ambulating in room and hallway without loss of balance.  Patient tolerated sitting up in chair after therapy - nursing staff notified.   PLAN:  Patient to be discharged home today and discharged from acute physical therapy with recommendations stated below.        Follow Up Recommendations Home health PT;Supervision for mobility/OOB;Supervision - Intermittent    Equipment Recommendations       Recommendations for Other Services       Precautions / Restrictions  Precautions Precautions: Fall Restrictions Weight Bearing Restrictions: No      Mobility  Bed Mobility Overal bed mobility: Needs Assistance Bed Mobility: Sit to Supine       Sit to supine: Min guard   General bed mobility comments: increased time, labored movement    Transfers Overall transfer level: Needs assistance Equipment used: Rolling walker (2 wheeled);None Transfers: Sit to/from [employer] to Stand: Min guard Stand pivot transfers: Min guard       General transfer comment: good return for transferring to [facility] using arm rest  Ambulation/Gait Ambulation/Gait assistance: Min guard;Supervision Gait Distance (Feet): 100 Feet Assistive device: Rolling walker (2 wheeled) Gait Pattern/deviations: Decreased step length - left;Decreased stance time - right;Decreased stride length Gait velocity: deceased   General Gait Details: slightly labored cadence without loss of balance using post op shoe on left foot, limited mostly due to fatigue  Stairs            Wheelchair Mobility    Modified Rankin (Stroke Patients Only)       Balance Overall balance assessment: Needs assistance Sitting-balance support: Feet supported;No upper extremity supported Sitting balance-Leahy Scale: Good Sitting balance - Comments: seated at EOB   Standing balance support: During functional activity;No upper extremity supported Standing balance-Leahy Scale: Poor Standing balance comment: fair/good using RW                             Pertinent Vitals/Pain Pain Assessment: 0-10 Pain Score: 3  Pain Location:  low back and right hip Pain Descriptors / Indicators: Aching;Sore Pain Intervention(s): Limited activity within patient's tolerance;Monitored during session    [place] expects to be discharged to:: Private residence Living Arrangements: Alone Available Help at Discharge: Family;Available PRN/intermittently Type of Home:  House Home Access: Stairs to enter Entrance Stairs-Rails: Left Entrance Stairs-Number of Steps: 3 Home Layout: Two level;Able to live on main level with bedroom/bathroom;Full bath on main level Home Equipment: Clinical cytogeneticist - 2 wheels;Cane - single point;Bedside commode      Prior Function Level of Independence: Needs assistance   Gait / Transfers Assistance Needed: household and short distanced community ambulator using SPC PRN, drives  ADL's / Homemaking Assistance Needed: assisted by family        Hand Dominance   Dominant Hand: Right    Extremity/Trunk Assessment   Upper Extremity Assessment Upper Extremity Assessment: Overall WFL for tasks assessed    Lower Extremity Assessment Lower Extremity Assessment: Generalized weakness    Cervical / Trunk Assessment Cervical / Trunk Assessment: Normal  Communication   Communication: No difficulties  Cognition Arousal/Alertness: Awake/alert Behavior During Therapy: WFL for tasks assessed/performed Overall Cognitive Status: Within Functional Limits for tasks assessed                                        General Comments      Exercises     Assessment/Plan    PT Assessment All further PT needs can be met in the next venue of care  PT Problem List Decreased strength;Decreased activity tolerance;Decreased balance;Decreased mobility       PT Treatment Interventions      PT Goals (Current goals can be found in the Care Plan section)  Acute Rehab PT Goals Patient Stated Goal: return home with family to assist PT Goal Formulation: With patient Time For Goal Achievement: 12/14/20 Potential to Achieve Goals: Good    Frequency     Barriers to discharge        Co-evaluation               AM-PAC PT "6 Clicks" Mobility  Outcome Measure Help needed turning from your back to your side while in a flat bed without using bedrails?: None Help needed moving from lying on your back to sitting  on the side of a flat bed without using bedrails?: A Little Help needed moving to and from a bed to a chair (including a wheelchair)?: A Little Help needed standing up from a chair using your arms (e.g., wheelchair or bedside chair)?: A Little Help needed to walk in hospital room?: A Little Help needed climbing 3-5 steps with a railing? : A Lot 6 Click Score: 18    End of Session   Activity Tolerance: Patient tolerated treatment well;Patient limited by fatigue Patient left: in chair;with call bell/phone within reach;with chair alarm set Nurse Communication: Mobility status PT Visit Diagnosis: Unsteadiness on feet (R26.81);Other abnormalities of gait and mobility (R26.89);Muscle weakness (generalized) (M62.81)    Time: 9798-9211 PT Time Calculation (min) (ACUTE ONLY): 35 min   Charges:   PT Evaluation $PT Eval Moderate Complexity: 1 Mod PT Treatments $Therapeutic Activity: 23-37 mins        11:52 AM, 12/14/20 Lonell Grandchild, MPT Physical Therapist with [facility] [phone] office [phone] mobile phone

## 2020-12-14 NOTE — Plan of Care (Signed)
  Problem: Health Behavior/Discharge Planning: Goal: Ability to manage health-related needs will improve Outcome: Progressing   Problem: Clinical Measurements: Goal: Ability to maintain clinical measurements within normal limits will improve Outcome: Progressing Goal: Will remain free from infection Outcome: Progressing   Problem: Activity: Goal: Risk for activity intolerance will decrease Outcome: Progressing   Problem: Nutrition: Goal: Adequate nutrition will be maintained Outcome: Progressing   Problem: Coping: Goal: Level of anxiety will decrease Outcome: Progressing   Problem: Safety: Goal: Ability to remain free from injury will improve Outcome: Progressing

## 2020-12-14 NOTE — Discharge Instructions (Signed)
IMPORTANT INFORMATION: PAY CLOSE ATTENTION   PHYSICIAN DISCHARGE INSTRUCTIONS  Follow with Primary care provider  Vyas, Dhruv B, MD  and other consultants as instructed by your Hospitalist Physician  SEEK MEDICAL CARE OR RETURN TO EMERGENCY ROOM IF SYMPTOMS COME BACK, WORSEN OR NEW PROBLEM DEVELOPS   Please note: You were cared for by a hospitalist during your hospital stay. Every effort will be made to forward records to your primary care provider.  You can request that your primary care provider send for your hospital records if they have not received them.  Once you are discharged, your primary care physician will handle any further medical issues. Please note that NO REFILLS for any discharge medications will be authorized once you are discharged, as it is imperative that you return to your primary care physician (or establish a relationship with a primary care physician if you do not have one) for your post hospital discharge needs so that they can reassess your need for medications and monitor your lab values.  Please get a complete blood count and chemistry panel checked by your Primary MD at your next visit, and again as instructed by your Primary MD.  Get Medicines reviewed and adjusted: Please take all your medications with you for your next visit with your Primary MD  Laboratory/radiological data: Please request your Primary MD to go over all hospital tests and procedure/radiological results at the follow up, please ask your primary care provider to get all Hospital records sent to his/her office.  In some cases, they will be blood work, cultures and biopsy results pending at the time of your discharge. Please request that your primary care provider follow up on these results.  If you are diabetic, please bring your blood sugar readings with you to your follow up appointment with primary care.    Please call and make your follow up appointments as soon as possible.    Also Note  the following: If you experience worsening of your admission symptoms, develop shortness of breath, life threatening emergency, suicidal or homicidal thoughts you must seek medical attention immediately by calling 911 or calling your MD immediately  if symptoms less severe.  You must read complete instructions/literature along with all the possible adverse reactions/side effects for all the Medicines you take and that have been prescribed to you. Take any new Medicines after you have completely understood and accpet all the possible adverse reactions/side effects.   Do not drive when taking Pain medications or sleeping medications (Benzodiazepines)  Do not take more than prescribed Pain, Sleep and Anxiety Medications. It is not advisable to combine anxiety,sleep and pain medications without talking with your primary care practitioner  Special Instructions: If you have smoked or chewed Tobacco  in the last 2 yrs please stop smoking, stop any regular Alcohol  and or any Recreational drug use.  Wear Seat belts while driving.  Do not drive if taking any narcotic, mind altering or controlled substances or recreational drugs or alcohol.       

## 2020-12-14 NOTE — Clinical Social Work Note (Signed)
Spoke with patient's daughter, who was at bedside. Agreeable to HHPT. Choices discussed, referral made to and accepted by [place] at [facility] [place].    Roann Merk, Clydene Pugh, LCSW

## 2020-12-14 NOTE — Discharge Summary (Signed)
Physician Discharge Summary  Debra Richards KTG:[id number] DOB: 21-Oct-1925 DOA: 12/13/2020  PCP: Glenda Chroman, MD  Admit date: 12/13/2020 Discharge date: 12/14/2020  Admitted From:  Home  Disposition:  Home with [place]  Recommendations for Outpatient Follow-up:  Follow up with PCP in 1 weeks Consider outpatient 2D echocardiogram   Home Health:  PT   Discharge Condition: STABLE   CODE STATUS: FULL  DIET: resume prior home diet    Brief Hospitalization Summary: Please see all hospital notes, images, labs for full details of the hospitalization. ADMISSION HPI:  Debra Richards is a 87 y.o. female with medical history significant of GERD, Nephrolithiasis, Hypothyroid, LBBB, RA, S/p Hip and Spinal surgery. Presenting after single syncopal episode. Hisotry obtained from [name], Pt and Pts daughter. Pt was in her usual state of health until 3-4 days ago when she developed lower abd pain, dysuria, frequency. States these are her usual UTI sx. Day prior to admission pt noted dark colored urine so she went to her PCP. Dx w/ UTI and started on Cipro. Did not take Cipro until the following morning (day of admission) Pt had her dauther over to the house and had just finished breakfast and took her first dose of Cipro. After getting up and ambulating to her home office she passed out. Pt denies any preceding HA, CP, palpitations, lightheadedness, n/v. Episode witnessd by pts daughter. Did not strike head. Pt woke up shortly after passin gout and endorses profuse diaphoresis. No cognitive delay or focal deficit. EMS called and pt noted to be hypotensive w/ SBP in the 60s. 400cc IVF given by EMS w/ improvement.     ED Course: labs and CT scan obtained. EKG w/o arrhythmia. After multiple liters of fluid pt BP at baseline and feeling like herself.  Hospital course Pt was admitted for observation for dehydration and syncope thought to be orthostatic secondary to dehydration related to a UTI.  She had started  treatment for UTI by her PCP with ciprofloxacin.  She was given IV fluid hydration with good results and feeling much better.  PT was consulted.  Home health PT ordered.  PT is feeling much better and stable to discharge home with HHPT.  Outpatient follow up with PCP recommended.  Also if has any recurrence of symptoms consider outpatient 2D echocardiogram.     Discharge Diagnoses:  Principal Problem:   Syncope Active Problems:   GERD   LBBB (left bundle branch block)   Hypothyroidism   Acute lower UTI   Hypotension   Physical deconditioning   Neuropathy   Nephrolithiasis   Discharge Instructions:  Allergies as of 12/14/2020       Reactions   Penicillins Hives   03/04/13: Pt has tolerated Amoxicillin/Unsyn without reaction   Contrast Media [iodinated Diagnostic Agents] Other (See Comments)   Arm swelled up and was painful after IVP years ago/    Levaquin [levofloxacin In D5w] Other (See Comments)   Made her sick all over        Medication List     STOP taking these medications    mirabegron ER 50 MG Tb24 tablet Commonly known as: MYRBETRIQ       TAKE these medications    aspirin EC 81 MG tablet Take 81 mg by mouth daily. Swallow whole.   cholecalciferol 1000 units tablet Commonly known as: VITAMIN D Take 1,000 Units by mouth daily.   ciprofloxacin 500 MG tablet Commonly known as: CIPRO Take 500 mg by mouth 2 (two) times daily.  gabapentin 100 MG capsule Commonly known as: NEURONTIN Take 100 mg by mouth 3 (three) times daily.   levothyroxine 75 MCG tablet Commonly known as: SYNTHROID Take 75 mcg by mouth daily before breakfast.   meclizine 25 MG tablet Commonly known as: ANTIVERT Take 25 mg by mouth 2 (two) times daily as needed for dizziness.   omeprazole 40 MG capsule Commonly known as: PRILOSEC Take 40 mg by mouth 3 (three) times a week.   vitamin E 180 MG (400 UNITS) capsule Take 400 Units by mouth daily.        Follow-up Information      Vyas, Dhruv B, MD. Schedule an appointment as soon as possible for a visit in 1 week(s).   Specialty: Internal Medicine Why: Hospital Follow Up Contact information: [place] [address] [phone]         Satira Sark, MD .   Specialty: Cardiology Contact information: [place] [address] [phone]                Allergies  Allergen Reactions   Penicillins Hives    03/04/13: Pt has tolerated Amoxicillin/Unsyn without reaction   Contrast Media [Iodinated Diagnostic Agents] Other (See Comments)    Arm swelled up and was painful after IVP years ago/    Levaquin [Levofloxacin In D5w] Other (See Comments)    Made her sick all over   Allergies as of 12/14/2020       Reactions   Penicillins Hives   03/04/13: Pt has tolerated Amoxicillin/Unsyn without reaction   Contrast Media [iodinated Diagnostic Agents] Other (See Comments)   Arm swelled up and was painful after IVP years ago/    Levaquin [levofloxacin In D5w] Other (See Comments)   Made her sick all over        Medication List     STOP taking these medications    mirabegron ER 50 MG Tb24 tablet Commonly known as: MYRBETRIQ       TAKE these medications    aspirin EC 81 MG tablet Take 81 mg by mouth daily. Swallow whole.   cholecalciferol 1000 units tablet Commonly known as: VITAMIN D Take 1,000 Units by mouth daily.   ciprofloxacin 500 MG tablet Commonly known as: CIPRO Take 500 mg by mouth 2 (two) times daily.   gabapentin 100 MG capsule Commonly known as: NEURONTIN Take 100 mg by mouth 3 (three) times daily.   levothyroxine 75 MCG tablet Commonly known as: SYNTHROID Take 75 mcg by mouth daily before breakfast.   meclizine 25 MG tablet Commonly known as: ANTIVERT Take 25 mg by mouth 2 (two) times daily as needed for dizziness.   omeprazole 40 MG capsule Commonly known as: PRILOSEC Take 40 mg by mouth 3 (three) times a week.   vitamin E 180  MG (400 UNITS) capsule Take 400 Units by mouth daily.        Procedures/Studies: DG Chest Port 1 View  Result Date: 12/13/2020 CLINICAL DATA:  Weakness, fatigue, possible sepsis EXAM: PORTABLE CHEST 1 VIEW COMPARISON:  09/15/2020 FINDINGS: Single frontal view of the chest demonstrates an unremarkable cardiac silhouette. Chronic scarring throughout the lungs without airspace disease, effusion, or pneumothorax. No acute bony abnormalities. IMPRESSION: 1. Chronic scarring consistent with emphysema. No acute airspace disease. Electronically Signed   By: Randa Ngo M.D.   On: 12/13/2020 15:59   CT Renal Stone Study  Result Date: 12/13/2020 CLINICAL DATA:  Flank pain.  Urinary frequency.  EXAM: CT ABDOMEN AND PELVIS WITHOUT CONTRAST TECHNIQUE: Multidetector CT imaging of the abdomen and pelvis was performed following the standard protocol without IV contrast. COMPARISON:  02/15/2016 FINDINGS: Lower chest: No acute findings. Hepatobiliary: No mass visualized on this unenhanced exam. Prior cholecystectomy. No evidence of biliary obstruction. Pancreas: No mass or inflammatory process visualized on this unenhanced exam. Spleen:  Within normal limits in size. Adrenals/Urinary tract: Multiple small renal calculi are again seen bilaterally, with largest in the right kidney measuring 6 mm. No evidence of ureteral calculi or hydronephrosis. Unremarkable unopacified urinary bladder. Stomach/Bowel: No evidence of obstruction, inflammatory process, or abnormal fluid collections. Normal appendix visualized. Diverticulosis is seen mainly involving the sigmoid colon, however there is no evidence of diverticulitis. Vascular/Lymphatic: No pathologically enlarged lymph nodes identified. No evidence of abdominal aortic aneurysm. Aortic atherosclerotic calcification noted. Reproductive: Prior hysterectomy noted. Adnexal regions are unremarkable in appearance. Other:  None. Musculoskeletal: No suspicious bone lesions  identified. Right hip prosthesis again noted. IMPRESSION: Bilateral nephrolithiasis. No evidence of ureteral calculi, hydronephrosis, or other acute findings. Colonic diverticulosis. No radiographic evidence of diverticulitis. Aortic Atherosclerosis (ICD10-I70.0). Electronically Signed   By: Marlaine Hind M.D.   On: 12/13/2020 17:38     Subjective: Pt reports that she is feeling much better.  No further syncopal episodes, no CP, no SOB.  She is agreeable to working with PT.   Discharge Exam: Vitals:   12/14/20 0237 12/14/20 0621  BP: (!) 103/46 (!) 105/47  Pulse: (!) 58 (!) 57  Resp: 16 18  Temp: 97.8 F (36.6 C) 98 F (36.7 C)  SpO2: 100% 95%   Vitals:   12/13/20 2000 12/13/20 2110 12/14/20 0237 12/14/20 0621  BP: (!) 106/56 (!) 106/47 (!) 103/46 (!) 105/47  Pulse: 61 (!) 57 (!) 58 (!) 57  Resp: 20 20 16 18   Temp:  98.8 F (37.1 C) 97.8 F (36.6 C) 98 F (36.7 C)  TempSrc:  Oral Oral Oral  SpO2: 100% 100% 100% 95%  Weight:      Height:       General: Pt is alert, awake, not in acute distress Cardiovascular: normal S1/S2 +, no rubs, no gallops Respiratory: CTA bilaterally, no wheezing, no rhonchi Abdominal: Soft, NT, ND, bowel sounds + Extremities: diffuse arthritic changes, no edema, no cyanosis Neurological: nonfocal exam.     The results of significant diagnostics from this hospitalization (including imaging, microbiology, ancillary and laboratory) are listed below for reference.     Microbiology: Recent Results (from the past 240 hour(s))  Culture, blood (routine x 2)     Status: None (Preliminary result)   Collection Time: 12/13/20  3:17 PM   Specimen: BLOOD LEFT HAND  Result Value Ref Range Status   Specimen Description   Final    BLOOD LEFT HAND BOTTLES DRAWN AEROBIC AND ANAEROBIC   Special Requests Blood Culture adequate volume  Final   Culture  Setup Time PENDING  Incomplete   Culture   Final    NO GROWTH < 24 HOURS Performed at [facility], [address], [place], [place] [address]    Report Status PENDING  Incomplete  Culture, blood (routine x 2)     Status: None (Preliminary result)   Collection Time: 12/13/20  3:17 PM   Specimen: BLOOD RIGHT ARM  Result Value Ref Range Status   Specimen Description   Final    BLOOD RIGHT ARM BOTTLES DRAWN AEROBIC AND ANAEROBIC   Special Requests Blood Culture adequate volume  Final  Culture   Final    NO GROWTH < 24 HOURS Performed at [facility], [address], [place], [place] [address]    Report Status PENDING  Incomplete  Resp Panel by RT-PCR (Flu A&B, Covid) Nasopharyngeal Swab     Status: None   Collection Time: 12/13/20  3:37 PM   Specimen: Nasopharyngeal Swab; Nasopharyngeal(NP) swabs in vial transport medium  Result Value Ref Range Status   SARS Coronavirus 2 by RT PCR NEGATIVE NEGATIVE Final    Comment: (NOTE) SARS-CoV-2 target nucleic acids are NOT DETECTED.  The SARS-CoV-2 RNA is generally detectable in upper respiratory specimens during the acute phase of infection. The lowest concentration of SARS-CoV-2 viral copies this assay can detect is 138 copies/mL. A negative result does not preclude SARS-Cov-2 infection and should not be used as the sole basis for treatment or other patient management decisions. A negative result may occur with  improper specimen collection/handling, submission of specimen other than nasopharyngeal swab, presence of viral mutation(s) within the areas targeted by this assay, and inadequate number of viral copies(<138 copies/mL). A negative result must be combined with clinical observations, patient history, and epidemiological information. The expected result is Negative.  Fact Sheet for Patients:  EntrepreneurPulse.com.au  Fact Sheet for Healthcare Providers:  IncredibleEmployment.be  This test is no t yet approved or cleared by the [place] FDA and  has been authorized for detection and/or diagnosis of  SARS-CoV-2 by FDA under an Emergency Use Authorization (EUA). This EUA will remain  in effect (meaning this test can be used) for the duration of the COVID-19 declaration under Section 564(b)(1) of the Act, 21 U.S.C.section 360bbb-3(b)(1), unless the authorization is terminated  or revoked sooner.       Influenza A by PCR NEGATIVE NEGATIVE Final   Influenza B by PCR NEGATIVE NEGATIVE Final    Comment: (NOTE) The Xpert Xpress SARS-CoV-2/FLU/RSV plus assay is intended as an aid in the diagnosis of influenza from Nasopharyngeal swab specimens and should not be used as a sole basis for treatment. Nasal washings and aspirates are unacceptable for Xpert Xpress SARS-CoV-2/FLU/RSV testing.  Fact Sheet for Patients: EntrepreneurPulse.com.au  Fact Sheet for Healthcare Providers: IncredibleEmployment.be  This test is not yet approved or cleared by the [place] FDA and has been authorized for detection and/or diagnosis of SARS-CoV-2 by FDA under an Emergency Use Authorization (EUA). This EUA will remain in effect (meaning this test can be used) for the duration of the COVID-19 declaration under Section 564(b)(1) of the Act, 21 U.S.C. section 360bbb-3(b)(1), unless the authorization is terminated or revoked.  Performed at [facility], [address] [place], [place] [address]      Labs: BNP (last 3 results) No results for input(s): BNP in the last 8760 hours. Basic Metabolic Panel: Recent Labs  Lab 12/13/20 1200 12/13/20 1853 12/14/20 0445  NA 140  --  138  K 4.1  --  3.5  CL 107  --  107  CO2 27  --  24  GLUCOSE 82  --  85  BUN 20  --  14  CREATININE 0.89 0.81 0.75  CALCIUM 8.6*  --  7.8*   Liver Function Tests: Recent Labs  Lab 12/13/20 1200  AST 26  ALT 18  ALKPHOS 52  BILITOT 0.6  PROT 5.6*  ALBUMIN 3.5   No results for input(s): LIPASE, AMYLASE in the last 168 hours. No results for input(s): AMMONIA in the last 168  hours. CBC: Recent Labs  Lab 12/13/20  1200  WBC 3.5*  HGB 12.7  HCT 38.5  MCV 102.1*  PLT 123*   Cardiac Enzymes: No results for input(s): CKTOTAL, CKMB, CKMBINDEX, TROPONINI in the last 168 hours. BNP: Invalid input(s): POCBNP CBG: Recent Labs  Lab 12/14/20 0735  GLUCAP 85   D-Dimer No results for input(s): DDIMER in the last 72 hours. Hgb A1c No results for input(s): HGBA1C in the last 72 hours. Lipid Profile No results for input(s): CHOL, HDL, LDLCALC, TRIG, CHOLHDL, LDLDIRECT in the last 72 hours. Thyroid function studies No results for input(s): TSH, T4TOTAL, T3FREE, THYROIDAB in the last 72 hours.  Invalid input(s): FREET3 Anemia work up No results for input(s): VITAMINB12, FOLATE, FERRITIN, TIBC, IRON, RETICCTPCT in the last 72 hours. Urinalysis    Component Value Date/Time   COLORURINE YELLOW 12/13/2020 1525   APPEARANCEUR CLEAR 12/13/2020 1525   APPEARANCEUR Clear 10/26/2020 1114   LABSPEC 1.009 12/13/2020 1525   PHURINE 7.0 12/13/2020 1525   GLUCOSEU NEGATIVE 12/13/2020 1525   HGBUR NEGATIVE 12/13/2020 1525   [place] 12/13/2020 1525   BILIRUBINUR Negative 10/26/2020 [place] 12/13/2020 1525   PROTEINUR NEGATIVE 12/13/2020 1525   UROBILINOGEN 1.0 01/01/2015 2140   NITRITE NEGATIVE 12/13/2020 1525   LEUKOCYTESUR NEGATIVE 12/13/2020 1525   Sepsis Labs Invalid input(s): PROCALCITONIN,  WBC,  LACTICIDVEN Microbiology Recent Results (from the past 240 hour(s))  Culture, blood (routine x 2)     Status: None (Preliminary result)   Collection Time: 12/13/20  3:17 PM   Specimen: BLOOD LEFT HAND  Result Value Ref Range Status   Specimen Description   Final    BLOOD LEFT HAND BOTTLES DRAWN AEROBIC AND ANAEROBIC   Special Requests Blood Culture adequate volume  Final   Culture  Setup Time PENDING  Incomplete   Culture   Final    NO GROWTH < 24 HOURS Performed at [facility], [address], [place], [place] [address]     Report Status PENDING  Incomplete  Culture, blood (routine x 2)     Status: None (Preliminary result)   Collection Time: 12/13/20  3:17 PM   Specimen: BLOOD RIGHT ARM  Result Value Ref Range Status   Specimen Description   Final    BLOOD RIGHT ARM BOTTLES DRAWN AEROBIC AND ANAEROBIC   Special Requests Blood Culture adequate volume  Final   Culture   Final    NO GROWTH < 24 HOURS Performed at [facility], [address], [place], [place] [address]    Report Status PENDING  Incomplete  Resp Panel by RT-PCR (Flu A&B, Covid) Nasopharyngeal Swab     Status: None   Collection Time: 12/13/20  3:37 PM   Specimen: Nasopharyngeal Swab; Nasopharyngeal(NP) swabs in vial transport medium  Result Value Ref Range Status   SARS Coronavirus 2 by RT PCR NEGATIVE NEGATIVE Final    Comment: (NOTE) SARS-CoV-2 target nucleic acids are NOT DETECTED.  The SARS-CoV-2 RNA is generally detectable in upper respiratory specimens during the acute phase of infection. The lowest concentration of SARS-CoV-2 viral copies this assay can detect is 138 copies/mL. A negative result does not preclude SARS-Cov-2 infection and should not be used as the sole basis for treatment or other patient management decisions. A negative result may occur with  improper specimen collection/handling, submission of specimen other than nasopharyngeal swab, presence of viral mutation(s) within the areas targeted by this assay, and inadequate number of viral copies(<138 copies/mL). A negative result must be combined with clinical observations,  patient history, and epidemiological information. The expected result is Negative.  Fact Sheet for Patients:  EntrepreneurPulse.com.au  Fact Sheet for Healthcare Providers:  IncredibleEmployment.be  This test is no t yet approved or cleared by the [place] FDA and  has been authorized for detection and/or diagnosis of SARS-CoV-2 by FDA under an Emergency  Use Authorization (EUA). This EUA will remain  in effect (meaning this test can be used) for the duration of the COVID-19 declaration under Section 564(b)(1) of the Act, 21 U.S.C.section 360bbb-3(b)(1), unless the authorization is terminated  or revoked sooner.       Influenza A by PCR NEGATIVE NEGATIVE Final   Influenza B by PCR NEGATIVE NEGATIVE Final    Comment: (NOTE) The Xpert Xpress SARS-CoV-2/FLU/RSV plus assay is intended as an aid in the diagnosis of influenza from Nasopharyngeal swab specimens and should not be used as a sole basis for treatment. Nasal washings and aspirates are unacceptable for Xpert Xpress SARS-CoV-2/FLU/RSV testing.  Fact Sheet for Patients: EntrepreneurPulse.com.au  Fact Sheet for Healthcare Providers: IncredibleEmployment.be  This test is not yet approved or cleared by the [place] FDA and has been authorized for detection and/or diagnosis of SARS-CoV-2 by FDA under an Emergency Use Authorization (EUA). This EUA will remain in effect (meaning this test can be used) for the duration of the COVID-19 declaration under Section 564(b)(1) of the Act, 21 U.S.C. section 360bbb-3(b)(1), unless the authorization is terminated or revoked.  Performed at [facility], [address], [place], [place] [address]     Time coordinating discharge:   SIGNED:  Irwin Brakeman, MD  [facility] 12/14/2020, 9:00 AM How to contact the [facility] Attending or Consulting provider [place] or covering provider during after hours [place], for this patient?  Check the care team in [facility] and look for a) attending/consulting [facility] provider listed and b) the [facility] team listed Log into [email or web address] and use [place]'s universal password to access. If you do not have the password, please contact the hospital operator. Locate the [facility] provider you are looking for under [facility] and page to a number that you can be directly  reached. If you still have difficulty reaching the provider, please page the [facility] (Director on Call) for the Hospitalists listed on amion for assistance.

## 2020-12-15 LAB — URINE CULTURE: Culture: <10000 — AB

## 2020-12-18 LAB — CULTURE, BLOOD (ROUTINE X 2)
Culture: NO GROWTH
Special Requests: ADEQUATE

## 2020-12-19 LAB — CULTURE, BLOOD (ROUTINE X 2)
Culture: NO GROWTH
Special Requests: ADEQUATE

## 2020-12-21 DIAGNOSIS — Z6822 Body mass index (BMI) 22.0-22.9, adult: Secondary | ICD-10-CM | POA: Diagnosis not present

## 2020-12-21 DIAGNOSIS — N390 Urinary tract infection, site not specified: Secondary | ICD-10-CM | POA: Diagnosis not present

## 2020-12-21 DIAGNOSIS — Z299 Encounter for prophylactic measures, unspecified: Secondary | ICD-10-CM | POA: Diagnosis not present

## 2020-12-21 DIAGNOSIS — N183 Chronic kidney disease, stage 3 unspecified: Secondary | ICD-10-CM | POA: Diagnosis not present

## 2020-12-21 DIAGNOSIS — I700 Atherosclerosis of aorta: Secondary | ICD-10-CM | POA: Diagnosis not present

## 2020-12-21 DIAGNOSIS — E860 Dehydration: Secondary | ICD-10-CM | POA: Diagnosis not present

## 2020-12-25 ENCOUNTER — Emergency Department (HOSPITAL_COMMUNITY)
Admission: EM | Admit: 2020-12-25 | Discharge: 2020-12-25 | Disposition: A | Discharge status: Home / Self Care | Payer: Medicare Other | Attending: Emergency Medicine | Admitting: Emergency Medicine

## 2020-12-25 ENCOUNTER — Encounter (HOSPITAL_COMMUNITY): Payer: Self-pay

## 2020-12-25 ENCOUNTER — Other Ambulatory Visit: Payer: Self-pay

## 2020-12-25 ENCOUNTER — Emergency Department (HOSPITAL_COMMUNITY): Payer: Medicare Other

## 2020-12-25 DIAGNOSIS — R109 Unspecified abdominal pain: Secondary | ICD-10-CM | POA: Diagnosis not present

## 2020-12-25 DIAGNOSIS — M791 Myalgia, unspecified site: Secondary | ICD-10-CM | POA: Diagnosis present

## 2020-12-25 DIAGNOSIS — E039 Hypothyroidism, unspecified: Secondary | ICD-10-CM | POA: Insufficient documentation

## 2020-12-25 DIAGNOSIS — Z96652 Presence of left artificial knee joint: Secondary | ICD-10-CM | POA: Diagnosis not present

## 2020-12-25 DIAGNOSIS — Z96641 Presence of right artificial hip joint: Secondary | ICD-10-CM | POA: Insufficient documentation

## 2020-12-25 DIAGNOSIS — I2510 Atherosclerotic heart disease of native coronary artery without angina pectoris: Secondary | ICD-10-CM | POA: Diagnosis not present

## 2020-12-25 DIAGNOSIS — R531 Weakness: Secondary | ICD-10-CM

## 2020-12-25 DIAGNOSIS — U071 COVID-19: Secondary | ICD-10-CM | POA: Insufficient documentation

## 2020-12-25 DIAGNOSIS — E876 Hypokalemia: Secondary | ICD-10-CM

## 2020-12-25 DIAGNOSIS — R262 Difficulty in walking, not elsewhere classified: Secondary | ICD-10-CM | POA: Diagnosis not present

## 2020-12-25 DIAGNOSIS — I959 Hypotension, unspecified: Secondary | ICD-10-CM | POA: Diagnosis not present

## 2020-12-25 DIAGNOSIS — R112 Nausea with vomiting, unspecified: Secondary | ICD-10-CM | POA: Diagnosis not present

## 2020-12-25 DIAGNOSIS — Z79899 Other long term (current) drug therapy: Secondary | ICD-10-CM | POA: Diagnosis not present

## 2020-12-25 DIAGNOSIS — N200 Calculus of kidney: Secondary | ICD-10-CM | POA: Diagnosis not present

## 2020-12-25 DIAGNOSIS — K5730 Diverticulosis of large intestine without perforation or abscess without bleeding: Secondary | ICD-10-CM | POA: Diagnosis not present

## 2020-12-25 DIAGNOSIS — Z7982 Long term (current) use of aspirin: Secondary | ICD-10-CM | POA: Insufficient documentation

## 2020-12-25 DIAGNOSIS — R946 Abnormal results of thyroid function studies: Secondary | ICD-10-CM | POA: Diagnosis not present

## 2020-12-25 DIAGNOSIS — I700 Atherosclerosis of aorta: Secondary | ICD-10-CM | POA: Diagnosis not present

## 2020-12-25 DIAGNOSIS — R7989 Other specified abnormal findings of blood chemistry: Secondary | ICD-10-CM

## 2020-12-25 DIAGNOSIS — R1110 Vomiting, unspecified: Secondary | ICD-10-CM | POA: Diagnosis not present

## 2020-12-25 LAB — COMPREHENSIVE METABOLIC PANEL
ALT: 17 U/L (ref 0–44)
AST: 31 U/L (ref 15–41)
Albumin: 3.7 g/dL (ref 3.5–5.0)
Alkaline Phosphatase: 52 U/L (ref 38–126)
Anion gap: 6 (ref 5–15)
BUN: 14 mg/dL (ref 8–23)
CO2: 23 mmol/L (ref 22–32)
Calcium: 7.9 mg/dL — ABNORMAL LOW (ref 8.9–10.3)
Chloride: 108 mmol/L (ref 98–111)
Creatinine, Ser: 0.73 mg/dL (ref 0.44–1.00)
GFR, Estimated: >60 mL/min (ref >60)
Glucose, Bld: 94 mg/dL (ref 70–99)
Potassium: 3.4 mmol/L — ABNORMAL LOW (ref 3.5–5.1)
Sodium: 137 mmol/L (ref 135–145)
Total Bilirubin: 0.9 mg/dL (ref 0.3–1.2)
Total Protein: 6.6 g/dL (ref 6.5–8.1)

## 2020-12-25 LAB — RESP PANEL BY RT-PCR (FLU A&B, COVID) ARPGX2
Influenza A by PCR: NEGATIVE
Influenza B by PCR: NEGATIVE
SARS Coronavirus 2 by RT PCR: POSITIVE — AB

## 2020-12-25 LAB — CBC
HCT: 42.3 % (ref 36.0–46.0)
Hemoglobin: 14.1 g/dL (ref 12.0–15.0)
MCH: 32.8 pg (ref 26.0–34.0)
MCHC: 33.3 g/dL (ref 30.0–36.0)
MCV: 98.4 fL (ref 80.0–100.0)
Platelets: 168 10*3/uL (ref 150–400)
RBC: 4.3 MIL/uL (ref 3.87–5.11)
RDW: 13.9 % (ref 11.5–15.5)
WBC: 3.2 10*3/uL — ABNORMAL LOW (ref 4.0–10.5)
nRBC: 0 % (ref 0.0–0.2)

## 2020-12-25 LAB — URINALYSIS, ROUTINE W REFLEX MICROSCOPIC
Bilirubin Urine: NEGATIVE
Glucose, UA: NEGATIVE mg/dL
Hgb urine dipstick: NEGATIVE
Ketones, ur: 80 mg/dL — AB
Nitrite: NEGATIVE
Protein, ur: 30 mg/dL — AB
Specific Gravity, Urine: 1.02 (ref 1.005–1.030)
pH: 6 (ref 5.0–8.0)

## 2020-12-25 LAB — TROPONIN I (HIGH SENSITIVITY)
Troponin I (High Sensitivity): 7 ng/L (ref <18)
Troponin I (High Sensitivity): 6 ng/L (ref <18)

## 2020-12-25 LAB — T4, FREE: Free T4: 1.19 ng/dL — ABNORMAL HIGH (ref 0.61–1.12)

## 2020-12-25 LAB — LIPASE, BLOOD: Lipase: 36 U/L (ref 11–51)

## 2020-12-25 LAB — TSH: TSH: 6.103 u[IU]/mL — ABNORMAL HIGH (ref 0.350–4.500)

## 2020-12-25 MED ORDER — POTASSIUM CHLORIDE CRYS ER 20 MEQ PO TBCR
40.0000 meq | EXTENDED_RELEASE_TABLET | Freq: Once | ORAL | Status: AC
  Administered 2020-12-25: 40 meq via ORAL
  Filled 2020-12-25: qty 2

## 2020-12-25 NOTE — ED Notes (Signed)
Pt is gone to CT

## 2020-12-25 NOTE — ED Triage Notes (Signed)
EMS reports pt recently hospitalized for n/v.  Reports started feeling bad having n/v since Saturday.  Reports had diarrhea earlier in the week but not in the last few days.  LBM was Saturday and was loose.  BP 109/52, hr 65, 92%02sat on room air. CBG 112, temp 97.7.

## 2020-12-25 NOTE — ED Notes (Signed)
Spoke with [place] informed about discharge.  [phone]

## 2020-12-25 NOTE — Discharge Instructions (Signed)
It was our pleasure to provide your ER care today - we hope that you feel better.  Your covid test is positive - see attached information.   Drink plenty of fluids/stay well hydrated, and get adequate nutrition - consider supplementing nutrition with Boost, Ensure or other nutritious shake. Stay active.  From today's labs your potassium level is slightly low - eat plenty of fruits and vegetables, and follow up with primary care doctor. Continue your thyroid medication - follow up your doctor to make sure the dose is adequate.   Follow up with primary care doctor in 1-2 weeks if symptoms fail to improve/resolve.  Return to ER if worse, new symptoms, high fevers, fainting, chest pain, trouble breathing, or other concern.

## 2020-12-25 NOTE — ED Provider Notes (Signed)
[facility] EMERGENCY DEPARTMENT Provider Note   CSN: [id number] Arrival date & time: 12/25/20  1030     History Chief Complaint  Patient presents with   Vomiting    Debra Richards is a 87 y.o. female.  Patient presents with generalized weakness in the past couple weeks. Symptoms acute onset, moderate, constant, persistent, w decreased appetite and decreased po intake. States recent admission for same ?uti then. No current dysuria or gu c/o. Denies trauma or fall. No syncope. No headache. No chest pain or discomfort. No cough or uri symptoms. No abd pain. Pt with recent episodes of nausea/vomiting - emesis not bloody or bilious.  Had normal bm today. No dysuria. Mild mylagias.   The history is provided by the patient and medical records. The history is limited by the condition of the patient.      Past Medical History:  Diagnosis Date   Complication of anesthesia    Trouble waking up   GERD (gastroesophageal reflux disease)    History of kidney stones    Hypothyroidism    LBBB (left bundle branch block)    MVP (mitral valve prolapse)    Osteoporosis    Palpitations    RA (rheumatoid arthritis) ([place])    Right ureteral stone    Rosacea     Patient Active Problem List   Diagnosis Date Noted   Syncope 12/13/2020   Physical deconditioning 12/13/2020   Neuropathy 12/13/2020   Nephrolithiasis 12/13/2020   Herniated lumbar disc without myelopathy 12/28/2019   Weakness 02/15/2016   Acute parotitis 02/15/2016   Atrial fibrillation ([place]) 02/15/2016   Hypokalemia 02/15/2016   Atypical chest pain 10/27/2013   Anxiety 10/27/2013   Pectus excavatum 04/25/2013   Acute lower UTI 02/28/2013   Hypotension 02/28/2013   Acute renal failure ([place]) 02/28/2013   Generalized weakness 02/28/2013   Thrombocytopenia ([place]) 02/28/2013   Acute encephalopathy 02/28/2013   Altered mental status 10/14/2012   Hip fracture, right ([place]) 10/12/2012   Altered awareness, transient 03/25/2012    Delirium 03/25/2012   Leukocytosis 03/25/2012   Postop Acute blood loss anemia 03/24/2012   OA (osteoarthritis) of knee 03/23/2012   Right ovarian cyst    MVP (mitral valve prolapse)    Coronary artery disease    GERD (gastroesophageal reflux disease)    History of kidney stones    RA (rheumatoid arthritis) ([place])    Dysrhythmia    Lateral meniscal tear    Rosacea    Right ureteral stone    LBBB (left bundle branch block)    Edema    Hypothyroidism    GERD 01/22/2010   CONSTIPATION 01/22/2010   DYSPHAGIA UNSPECIFIED 01/22/2010   ABDOMINAL PAIN RIGHT LOWER QUADRANT 01/22/2010    Past Surgical History:  Procedure Laterality Date   BACK SURGERY     CATARACT EXTRACTION W/ INTRAOCULAR LENS  IMPLANT, BILATERAL  4 yrs ago   both eyes   CHOLECYSTECTOMY  2005   EXTRACORPOREAL SHOCK WAVE LITHOTRIPSY  09-24-10   and 1990   Femur fx     HIP ARTHROPLASTY Right 10/12/2012   Procedure: ARTHROPLASTY BIPOLAR HIP;  Surgeon: Gearlean Alf, MD;  Location: [facility];  Service: Orthopedics;  Laterality: Right;   KNEE ARTHROSCOPY  04/17/2011, right   Procedure: ARTHROSCOPY KNEE;  Surgeon: Gearlean Alf;  Location: [place];  Service: Orthopedics;  Laterality: Right;  WITH LATERAL MENISCAL DEBRIDEMENT   LUMBAR LAMINECTOMY/DECOMPRESSION MICRODISCECTOMY Right 12/28/2019   Procedure: Right Lumbar Four-Lumbar Five Microdiscectomy;  Surgeon: Erline Levine, MD;  Location: [place];  Service: Neurosurgery;  Laterality: Right;  Right Lumbar Four-Lumbar Five Microdiscectomy   MOLES EXCISED  2012   on nose   [place]ternum Fx     TOTAL KNEE ARTHROPLASTY  03/23/2012   Procedure: TOTAL KNEE ARTHROPLASTY;  Surgeon: Gearlean Alf, MD;  Location: [facility];  Service: Orthopedics;  Laterality: Right;   VAGINAL HYSTERECTOMY  1970   Leiomyomata     OB History     Gravida  2   Para  2   Term  2   Preterm      AB      Living  2      SAB      IAB      Ectopic      Multiple      Live  Births              Family History  Problem Relation Age of Onset   Cancer Mother        COLON   Heart disease Father        STROKE   Stroke Father    Ovarian cancer Maternal Grandmother    Crohn's disease Daughter     Social History   Tobacco Use   Smoking status: Never   Smokeless tobacco: Never  Vaping Use   Vaping Use: Never used  Substance Use Topics   Alcohol use: No    Alcohol/week: 0.0 standard drinks   Drug use: No    Home Medications Prior to Admission medications   Medication Sig Start Date End Date Taking? Authorizing Provider  aspirin EC 81 MG tablet Take 81 mg by mouth daily. Swallow whole.    [provider]  cholecalciferol (VITAMIN D) 1000 units tablet Take 1,000 Units by mouth daily.     [provider]  ciprofloxacin (CIPRO) 500 MG tablet Take 500 mg by mouth 2 (two) times daily. 12/12/20   [provider]  gabapentin (NEURONTIN) 100 MG capsule Take 100 mg by mouth 3 (three) times daily. 09/28/20   [provider]  levothyroxine (SYNTHROID, LEVOTHROID) 75 MCG tablet Take 75 mcg by mouth daily before breakfast.    [provider]  meclizine (ANTIVERT) 25 MG tablet Take 25 mg by mouth 2 (two) times daily as needed for dizziness. 06/21/20   [provider]  omeprazole (PRILOSEC) 40 MG capsule Take 40 mg by mouth 3 (three) times a week.  09/15/19   [provider]  vitamin E 400 UNIT capsule Take 400 Units by mouth daily.     [provider]    Allergies    Penicillins, Contrast media [iodinated diagnostic agents], and Levaquin [levofloxacin in d5w]  Review of Systems   Review of Systems  Constitutional:  Negative for chills and fever.  HENT:  Negative for sore throat.   Eyes:  Negative for redness.  Respiratory:  Negative for cough and shortness of breath.   Cardiovascular:  Negative for chest pain.  Gastrointestinal:  Positive for nausea and vomiting. Negative for abdominal pain,  constipation and diarrhea.  Genitourinary:  Negative for dysuria and flank pain.  Musculoskeletal:  Negative for back pain and neck pain.  Skin:  Negative for rash.  Neurological:  Negative for headaches.  Hematological:  Does not bruise/bleed easily.  Psychiatric/Behavioral:  Negative for confusion.    Physical Exam Updated Vital Signs Ht 1.651 m ('5\' 5"'$ )   Wt 59 kg   BMI 21.63 kg/m  Physical Exam Vitals and nursing note reviewed.  Constitutional:      Appearance: Normal appearance. She is well-developed.  HENT:     Head: Atraumatic.     Nose: Nose normal.     Mouth/Throat:     Mouth: Mucous membranes are moist.  Eyes:     General: No scleral icterus.    Conjunctiva/sclera: Conjunctivae normal.     Pupils: Pupils are equal, round, and reactive to light.  Neck:     Vascular: No carotid bruit.     Trachea: No tracheal deviation.     Comments: No stiffness or rigidity. Thyroid not grossly enlarged or tender.  Cardiovascular:     Rate and Rhythm: Normal rate and regular rhythm.     Pulses: Normal pulses.     Heart sounds: Normal heart sounds. No murmur heard.   No friction rub. No gallop.  Pulmonary:     Effort: Pulmonary effort is normal. No respiratory distress.     Breath sounds: Normal breath sounds.  Abdominal:     General: Bowel sounds are normal. There is no distension.     Palpations: Abdomen is soft.     Tenderness: There is abdominal tenderness. There is no guarding.     Comments: Upper abd tenderness.   Genitourinary:    Comments: No cva tenderness.  Musculoskeletal:        General: No swelling or tenderness.     Cervical back: Normal range of motion and neck supple. No rigidity or tenderness. No muscular tenderness.     Right lower leg: No edema.     Left lower leg: No edema.  Skin:    General: Skin is warm and dry.     Findings: No rash.  Neurological:     Mental Status: She is alert.     Comments: Alert, speech normal. Motor/sens grossly intact bil.    Psychiatric:        Mood and Affect: Mood normal.    ED Results / Procedures / Treatments   Labs (all labs ordered are listed, but only abnormal results are displayed) Results for orders placed or performed during the hospital encounter of 12/25/20  Resp Panel by RT-PCR (Flu A&B, Covid) Nasopharyngeal Swab   Specimen: Nasopharyngeal Swab; Nasopharyngeal(NP) swabs in vial transport medium  Result Value Ref Range   SARS Coronavirus 2 by RT PCR POSITIVE (A) NEGATIVE   Influenza A by PCR NEGATIVE NEGATIVE   Influenza B by PCR NEGATIVE NEGATIVE  CBC  Result Value Ref Range   WBC 3.2 (L) 4.0 - 10.5 K/uL   RBC 4.30 3.87 - 5.11 MIL/uL   Hemoglobin 14.1 12.0 - 15.0 g/dL   HCT 42.3 36.0 - 46.0 %   MCV 98.4 80.0 - 100.0 fL   MCH 32.8 26.0 - 34.0 pg   MCHC 33.3 30.0 - 36.0 g/dL   RDW 13.9 11.5 - 15.5 %   Platelets 168 150 - 400 K/uL   nRBC 0.0 0.0 - 0.2 %  Comprehensive metabolic panel  Result Value Ref Range   Sodium 137 135 - 145 mmol/L   Potassium 3.4 (L) 3.5 - 5.1 mmol/L   Chloride 108 98 - 111 mmol/L   CO2 23 22 - 32 mmol/L   Glucose, Bld 94 70 - 99 mg/dL   BUN 14 8 - 23 mg/dL   Creatinine, Ser 0.73 0.44 - 1.00 mg/dL   Calcium 7.9 (L) 8.9 - 10.3 mg/dL   Total Protein 6.6 6.5 - 8.1 g/dL  Albumin 3.7 3.5 - 5.0 g/dL   AST 31 15 - 41 U/L   ALT 17 0 - 44 U/L   Alkaline Phosphatase 52 38 - 126 U/L   Total Bilirubin 0.9 0.3 - 1.2 mg/dL   GFR, Estimated >60 >60 mL/min   Anion gap 6 5 - 15  Urinalysis, Routine w reflex microscopic Urine, Clean Catch  Result Value Ref Range   Color, Urine AMBER (A) YELLOW   APPearance HAZY (A) CLEAR   Specific Gravity, Urine 1.020 1.005 - 1.030   pH 6.0 5.0 - 8.0   Glucose, UA NEGATIVE NEGATIVE mg/dL   Hgb urine dipstick NEGATIVE NEGATIVE   Bilirubin Urine NEGATIVE NEGATIVE   Ketones, ur 80 (A) NEGATIVE mg/dL   Protein, ur 30 (A) NEGATIVE mg/dL   Nitrite NEGATIVE NEGATIVE   Leukocytes,Ua MODERATE (A) NEGATIVE   RBC / HPF 0-5 0 - 5 RBC/hpf    WBC, UA 11-20 0 - 5 WBC/hpf   Bacteria, UA RARE (A) NONE SEEN   Squamous Epithelial / LPF 0-5 0 - 5   Mucus PRESENT    Hyaline Casts, UA PRESENT   Lipase, blood  Result Value Ref Range   Lipase 36 11 - 51 U/L  TSH  Result Value Ref Range   TSH 6.103 (H) 0.350 - 4.500 uIU/mL  Troponin I (High Sensitivity)  Result Value Ref Range   Troponin I (High Sensitivity) 7 <18 ng/L  Troponin I (High Sensitivity)  Result Value Ref Range   Troponin I (High Sensitivity) 6 <18 ng/L   DG Chest Port 1 View  Result Date: 12/25/2020 CLINICAL DATA:  Weakness EXAM: PORTABLE CHEST 1 VIEW COMPARISON:  12/13/2020 FINDINGS: Biapical scarring. No acute confluent opacities or effusions. Heart is normal size. Aortic atherosclerosis. No acute bony abnormality. IMPRESSION: No active disease. Electronically Signed   By: Rolm Baptise M.D.   On: 12/25/2020 11:55   DG Chest Port 1 View  Result Date: 12/13/2020 CLINICAL DATA:  Weakness, fatigue, possible sepsis EXAM: PORTABLE CHEST 1 VIEW COMPARISON:  09/15/2020 FINDINGS: Single frontal view of the chest demonstrates an unremarkable cardiac silhouette. Chronic scarring throughout the lungs without airspace disease, effusion, or pneumothorax. No acute bony abnormalities. IMPRESSION: 1. Chronic scarring consistent with emphysema. No acute airspace disease. Electronically Signed   By: Randa Ngo M.D.   On: 12/13/2020 15:59   CT Renal Stone Study  Result Date: 12/13/2020 CLINICAL DATA:  Flank pain.  Urinary frequency. EXAM: CT ABDOMEN AND PELVIS WITHOUT CONTRAST TECHNIQUE: Multidetector CT imaging of the abdomen and pelvis was performed following the standard protocol without IV contrast. COMPARISON:  02/15/2016 FINDINGS: Lower chest: No acute findings. Hepatobiliary: No mass visualized on this unenhanced exam. Prior cholecystectomy. No evidence of biliary obstruction. Pancreas: No mass or inflammatory process visualized on this unenhanced exam. Spleen:  Within normal  limits in size. Adrenals/Urinary tract: Multiple small renal calculi are again seen bilaterally, with largest in the right kidney measuring 6 mm. No evidence of ureteral calculi or hydronephrosis. Unremarkable unopacified urinary bladder. Stomach/Bowel: No evidence of obstruction, inflammatory process, or abnormal fluid collections. Normal appendix visualized. Diverticulosis is seen mainly involving the sigmoid colon, however there is no evidence of diverticulitis. Vascular/Lymphatic: No pathologically enlarged lymph nodes identified. No evidence of abdominal aortic aneurysm. Aortic atherosclerotic calcification noted. Reproductive: Prior hysterectomy noted. Adnexal regions are unremarkable in appearance. Other:  None. Musculoskeletal: No suspicious bone lesions identified. Right hip prosthesis again noted. IMPRESSION: Bilateral nephrolithiasis. No evidence of ureteral calculi, hydronephrosis, or other  acute findings. Colonic diverticulosis. No radiographic evidence of diverticulitis. Aortic Atherosclerosis (ICD10-I70.0). Electronically Signed   By: Marlaine Hind M.D.   On: 12/13/2020 17:38    EKG None  Radiology DG Chest Port 1 View  Result Date: 12/25/2020 CLINICAL DATA:  Weakness EXAM: PORTABLE CHEST 1 VIEW COMPARISON:  12/13/2020 FINDINGS: Biapical scarring. No acute confluent opacities or effusions. Heart is normal size. Aortic atherosclerosis. No acute bony abnormality. IMPRESSION: No active disease. Electronically Signed   By: Rolm Baptise M.D.   On: 12/25/2020 11:55    Procedures Procedures   Medications Ordered in ED Medications - No data to display  ED Course  I have reviewed the triage vital signs and the nursing notes.  Pertinent labs & imaging results that were available during my care of the patient were reviewed by me and considered in my medical decision making (see chart for details).    MDM Rules/Calculators/A&P                          Iv ns. Continuous pulse ox and cardiac  monitoring. Stat labs. Imaging.   Reviewed nursing notes and prior charts for additional history.   Labs reviewed/interpreted by me - k sl low. Kcl po. Wbc not elevated. Hgb normal. UA nitrite neg and recent urine culture negative. No dysuria.   CXR reviewed/interpreted by me - no pna.   Additional labs reviewed/interpreted by me - covid test positive.   Po fluids/food.   As symptom onset ~ 2 weeks ago, do not feel benefit of mono abx or oral therapy for covid. Pt further indicates shes decided not to get vaccine or other specific covid therapy.   Pulse ox 98% room air, breathing comfortably.   Pt currently appears stable for d/c.      Final Clinical Impression(s) / ED Diagnoses Final diagnoses:  None    Rx / DC Orders ED Discharge Orders     None        Lajean Saver, MD 12/25/20 1513

## 2020-12-25 NOTE — ED Notes (Signed)
Patient is resting comfortably. 

## 2020-12-25 NOTE — ED Notes (Signed)
Given fluids po.

## 2020-12-25 NOTE — ED Notes (Signed)
Pt given update on pt discharge instructions.

## 2020-12-28 LAB — URINE CULTURE: Culture: 50000 — AB

## 2020-12-29 ENCOUNTER — Telehealth: Payer: Self-pay

## 2020-12-29 NOTE — Telephone Encounter (Signed)
Post ED Visit - Positive Culture Follow-up  Culture report reviewed by antimicrobial stewardship pharmacist: [facility] Team '[]'$  Elenor Quinones, Pharm.D. '[]'$  Heide Guile, Pharm.D., BCPS AQ-ID '[]'$  Parks Neptune, Pharm.D., BCPS '[]'$  Alycia Rossetti, Pharm.D., BCPS '[]'$  [place], [place].D., BCPS, AAHIVP '[]'$  Legrand Como, Pharm.D., BCPS, AAHIVP '[]'$  Salome Arnt, PharmD, BCPS '[]'$  Johnnette Gourd, PharmD, BCPS '[]'$  Hughes Better, PharmD, BCPS '[]'$  Leeroy Cha, PharmD '[]'$  Laqueta Linden, PharmD, BCPS '[x]'$  Jeanella Cara, PharmD  [facility] Team '[]'$  Leodis Sias, PharmD '[]'$  Lindell Spar, PharmD '[]'$  Royetta Asal, PharmD '[]'$  Graylin Shiver, Rph '[]'$  Rema Fendt) Glennon Mac, PharmD '[]'$  Arlyn Dunning, PharmD '[]'$  Netta Cedars, PharmD '[]'$  Dia Sitter, PharmD '[]'$  Leone Haven, PharmD '[]'$  Gretta Arab, PharmD '[]'$  Theodis Shove, PharmD '[]'$  Peggyann Juba, PharmD '[]'$  Reuel Boom, PharmD   Urine culture with Escherichia coli. No urinary symptoms, CT scan clear, fluids given in the ED. No antibiotics indicated, ED visit primarily for COVID. No further patient follow-up is required at this time.  Glennon Hamilton 12/29/2020, 11:30 AM

## 2021-01-03 DIAGNOSIS — Z299 Encounter for prophylactic measures, unspecified: Secondary | ICD-10-CM | POA: Diagnosis not present

## 2021-01-03 DIAGNOSIS — U071 COVID-19: Secondary | ICD-10-CM | POA: Diagnosis not present

## 2021-03-07 ENCOUNTER — Emergency Department (HOSPITAL_COMMUNITY): Payer: Medicare Other

## 2021-03-07 ENCOUNTER — Emergency Department (HOSPITAL_COMMUNITY)
Admission: EM | Admit: 2021-03-07 | Discharge: 2021-03-07 | Disposition: A | Discharge status: Home / Self Care | Payer: Medicare Other | Attending: Emergency Medicine | Admitting: Emergency Medicine

## 2021-03-07 ENCOUNTER — Other Ambulatory Visit: Payer: Self-pay

## 2021-03-07 ENCOUNTER — Encounter (HOSPITAL_COMMUNITY): Payer: Self-pay | Admitting: Emergency Medicine

## 2021-03-07 DIAGNOSIS — R55 Syncope and collapse: Secondary | ICD-10-CM | POA: Diagnosis not present

## 2021-03-07 DIAGNOSIS — Z7982 Long term (current) use of aspirin: Secondary | ICD-10-CM | POA: Diagnosis not present

## 2021-03-07 DIAGNOSIS — R531 Weakness: Secondary | ICD-10-CM | POA: Diagnosis not present

## 2021-03-07 DIAGNOSIS — R001 Bradycardia, unspecified: Secondary | ICD-10-CM | POA: Diagnosis not present

## 2021-03-07 DIAGNOSIS — I959 Hypotension, unspecified: Secondary | ICD-10-CM

## 2021-03-07 DIAGNOSIS — Z79899 Other long term (current) drug therapy: Secondary | ICD-10-CM | POA: Insufficient documentation

## 2021-03-07 DIAGNOSIS — Z96651 Presence of right artificial knee joint: Secondary | ICD-10-CM | POA: Diagnosis not present

## 2021-03-07 DIAGNOSIS — E039 Hypothyroidism, unspecified: Secondary | ICD-10-CM | POA: Insufficient documentation

## 2021-03-07 DIAGNOSIS — R000 Tachycardia, unspecified: Secondary | ICD-10-CM | POA: Diagnosis not present

## 2021-03-07 DIAGNOSIS — Z96641 Presence of right artificial hip joint: Secondary | ICD-10-CM | POA: Diagnosis not present

## 2021-03-07 DIAGNOSIS — I2510 Atherosclerotic heart disease of native coronary artery without angina pectoris: Secondary | ICD-10-CM | POA: Diagnosis not present

## 2021-03-07 LAB — CBC WITH DIFFERENTIAL/PLATELET
Abs Immature Granulocytes: 0.03 10*3/uL (ref 0.00–0.07)
Basophils Absolute: 0 10*3/uL (ref 0.0–0.1)
Basophils Relative: 0 %
Eosinophils Absolute: 0.1 10*3/uL (ref 0.0–0.5)
Eosinophils Relative: 3 %
HCT: 38 % (ref 36.0–46.0)
Hemoglobin: 12.3 g/dL (ref 12.0–15.0)
Immature Granulocytes: 1 %
Lymphocytes Relative: 14 %
Lymphs Abs: 0.7 10*3/uL (ref 0.7–4.0)
MCH: 33.5 pg (ref 26.0–34.0)
MCHC: 32.4 g/dL (ref 30.0–36.0)
MCV: 103.5 fL — ABNORMAL HIGH (ref 80.0–100.0)
Monocytes Absolute: 0.3 10*3/uL (ref 0.1–1.0)
Monocytes Relative: 6 %
Neutro Abs: 3.8 10*3/uL (ref 1.7–7.7)
Neutrophils Relative %: 76 %
Platelets: 155 10*3/uL (ref 150–400)
RBC: 3.67 MIL/uL — ABNORMAL LOW (ref 3.87–5.11)
RDW: 13.5 % (ref 11.5–15.5)
WBC: 4.9 10*3/uL (ref 4.0–10.5)
nRBC: 0 % (ref 0.0–0.2)

## 2021-03-07 LAB — URINALYSIS, ROUTINE W REFLEX MICROSCOPIC
Bilirubin Urine: NEGATIVE
Glucose, UA: NEGATIVE mg/dL
Hgb urine dipstick: NEGATIVE
Ketones, ur: NEGATIVE mg/dL
Leukocytes,Ua: NEGATIVE
Nitrite: NEGATIVE
Protein, ur: NEGATIVE mg/dL
Specific Gravity, Urine: 1.005 (ref 1.005–1.030)
pH: 7 (ref 5.0–8.0)

## 2021-03-07 LAB — BASIC METABOLIC PANEL
Anion gap: 5 (ref 5–15)
BUN: 20 mg/dL (ref 8–23)
CO2: 24 mmol/L (ref 22–32)
Calcium: 8.6 mg/dL — ABNORMAL LOW (ref 8.9–10.3)
Chloride: 110 mmol/L (ref 98–111)
Creatinine, Ser: 0.8 mg/dL (ref 0.44–1.00)
GFR, Estimated: >60 mL/min (ref >60)
Glucose, Bld: 107 mg/dL — ABNORMAL HIGH (ref 70–99)
Potassium: 3.8 mmol/L (ref 3.5–5.1)
Sodium: 139 mmol/L (ref 135–145)

## 2021-03-07 LAB — CBG MONITORING, ED: Glucose-Capillary: 97 mg/dL (ref 70–99)

## 2021-03-07 LAB — PROTIME-INR
INR: 1 (ref 0.8–1.2)
Prothrombin Time: 13.3 seconds (ref 11.4–15.2)

## 2021-03-07 MED ORDER — SODIUM CHLORIDE 0.9 % IV BOLUS
500.0000 mL | Freq: Once | INTRAVENOUS | Status: AC
  Administered 2021-03-07: 500 mL via INTRAVENOUS

## 2021-03-07 NOTE — ED Notes (Signed)
Purewick placed on pt. 

## 2021-03-07 NOTE — ED Provider Notes (Signed)
[facility] EMERGENCY DEPARTMENT Provider Note   CSN: [id number] Arrival date & time: 03/07/21  1142     History Chief Complaint  Patient presents with   Hypotension    Debra Richards is a 87 y.o. female.  She is here by ambulance after a near syncopal event at home today.  She was standing in the kitchen with her daughter when she was diaphoretic and felt very weak.  Her daughter helped her into a chair and she did not fall.  EMS found her with blood pressure of 70 and this improved and the diaphoresis resolved after some IV fluids.  She denies any recent illness.  She said she has had near syncopal events in the past.  No recent medication changes.  The history is provided by the patient and the EMS personnel.  Near Syncope This is a recurrent problem. The current episode started less than 1 hour ago. The problem occurs rarely. The problem has been resolved. Associated symptoms include shortness of breath. Pertinent negatives include no chest pain, no abdominal pain and no headaches. The symptoms are aggravated by standing. The symptoms are relieved by position. She has tried rest for the symptoms. The treatment provided moderate relief.      Past Medical History:  Diagnosis Date   Complication of anesthesia    Trouble waking up   GERD (gastroesophageal reflux disease)    History of kidney stones    Hypothyroidism    LBBB (left bundle branch block)    MVP (mitral valve prolapse)    Osteoporosis    Palpitations    RA (rheumatoid arthritis) ([place])    Right ureteral stone    Rosacea     Patient Active Problem List   Diagnosis Date Noted   Syncope 12/13/2020   Physical deconditioning 12/13/2020   Neuropathy 12/13/2020   Nephrolithiasis 12/13/2020   Herniated lumbar disc without myelopathy 12/28/2019   Weakness 02/15/2016   Acute parotitis 02/15/2016   Atrial fibrillation ([place]) 02/15/2016   Hypokalemia 02/15/2016   Atypical chest pain 10/27/2013   Anxiety 10/27/2013    Pectus excavatum 04/25/2013   Acute lower UTI 02/28/2013   Hypotension 02/28/2013   Acute renal failure ([place]) 02/28/2013   Generalized weakness 02/28/2013   Thrombocytopenia ([place]) 02/28/2013   Acute encephalopathy 02/28/2013   Altered mental status 10/14/2012   Hip fracture, right ([place]) 10/12/2012   Altered awareness, transient 03/25/2012   Delirium 03/25/2012   Leukocytosis 03/25/2012   Postop Acute blood loss anemia 03/24/2012   OA (osteoarthritis) of knee 03/23/2012   Right ovarian cyst    MVP (mitral valve prolapse)    Coronary artery disease    GERD (gastroesophageal reflux disease)    History of kidney stones    RA (rheumatoid arthritis) ([place])    Dysrhythmia    Lateral meniscal tear    Rosacea    Right ureteral stone    LBBB (left bundle branch block)    Edema    Hypothyroidism    GERD 01/22/2010   CONSTIPATION 01/22/2010   DYSPHAGIA UNSPECIFIED 01/22/2010   ABDOMINAL PAIN RIGHT LOWER QUADRANT 01/22/2010    Past Surgical History:  Procedure Laterality Date   BACK SURGERY     CATARACT EXTRACTION W/ INTRAOCULAR LENS  IMPLANT, BILATERAL  4 yrs ago   both eyes   CHOLECYSTECTOMY  2005   EXTRACORPOREAL SHOCK WAVE LITHOTRIPSY  09-24-10   and 1990   Femur fx     HIP ARTHROPLASTY Right 10/12/2012   Procedure: ARTHROPLASTY BIPOLAR  HIP;  Surgeon: Gearlean Alf, MD;  Location: [facility];  Service: Orthopedics;  Laterality: Right;   KNEE ARTHROSCOPY  04/17/2011, right   Procedure: ARTHROSCOPY KNEE;  Surgeon: Gearlean Alf;  Location: [place];  Service: Orthopedics;  Laterality: Right;  WITH LATERAL MENISCAL DEBRIDEMENT   LUMBAR LAMINECTOMY/DECOMPRESSION MICRODISCECTOMY Right 12/28/2019   Procedure: Right Lumbar Four-Lumbar Five Microdiscectomy;  Surgeon: Erline Levine, MD;  Location: [place];  Service: Neurosurgery;  Laterality: Right;  Right Lumbar Four-Lumbar Five Microdiscectomy   MOLES EXCISED  2012   on nose   Sternum Fx     TOTAL KNEE ARTHROPLASTY   03/23/2012   Procedure: TOTAL KNEE ARTHROPLASTY;  Surgeon: Gearlean Alf, MD;  [facility];  Service: Orthopedics;  Laterality: Right;   VAGINAL HYSTERECTOMY  1970   Leiomyomata     OB History     Gravida  2   Para  2   Term  2   Preterm      AB      Living  2      SAB      IAB      Ectopic      Multiple      Live Births              Family History  Problem Relation Age of Onset   Cancer Mother        COLON   Heart disease Father        STROKE   Stroke Father    Ovarian cancer Maternal Grandmother    Crohn's disease Daughter     Social History   Tobacco Use   Smoking status: Never   Smokeless tobacco: Never  Vaping Use   Vaping Use: Never used  Substance Use Topics   Alcohol use: No    Alcohol/week: 0.0 standard drinks   Drug use: No    Home Medications Prior to Admission medications   Medication Sig Start Date End Date Taking? Authorizing Provider  aspirin EC 81 MG tablet Take 81 mg by mouth daily. Swallow whole.    [provider]  cholecalciferol (VITAMIN D) 1000 units tablet Take 1,000 Units by mouth daily.     [provider]  ciprofloxacin (CIPRO) 500 MG tablet Take 500 mg by mouth 2 (two) times daily. 12/12/20   [provider]  gabapentin (NEURONTIN) 100 MG capsule Take 100 mg by mouth 3 (three) times daily. 09/28/20   [provider]  levothyroxine (SYNTHROID, LEVOTHROID) 75 MCG tablet Take 75 mcg by mouth daily before breakfast.    [provider]  meclizine (ANTIVERT) 25 MG tablet Take 25 mg by mouth 2 (two) times daily as needed for dizziness. 06/21/20   [provider]  omeprazole (PRILOSEC) 40 MG capsule Take 40 mg by mouth 3 (three) times a week.  09/15/19   [provider]  vitamin E 400 UNIT capsule Take 400 Units by mouth daily.     [provider]    Allergies    Penicillins, Contrast media [iodinated diagnostic agents], and Levaquin [levofloxacin in  d5w]  Review of Systems   Review of Systems  Constitutional:  Negative for fever.  HENT:  Negative for sore throat.   Eyes:  Positive for visual disturbance.  Respiratory:  Positive for shortness of breath.   Cardiovascular:  Positive for near-syncope. Negative for chest pain.  Gastrointestinal:  Negative for abdominal pain.  Genitourinary:  Negative for dysuria.  Musculoskeletal:  Negative for neck pain.  Skin:  Negative for rash.  Neurological:  Positive for light-headedness. Negative for headaches.   Physical Exam Updated Vital Signs BP (!) 115/46 (BP Location: Right Arm)   Pulse (!) 53   Temp 97.6 F (36.4 C) (Oral)   Resp 13   Ht 5\' 5"  (1.651 m)   Wt 61.2 kg   SpO2 100%   BMI 22.47 kg/m   Physical Exam Vitals and nursing note reviewed.  Constitutional:      General: She is not in acute distress.    Appearance: Normal appearance. She is well-developed.  HENT:     Head: Normocephalic and atraumatic.  Eyes:     Conjunctiva/sclera: Conjunctivae normal.  Cardiovascular:     Rate and Rhythm: Normal rate and regular rhythm.     Heart sounds: No murmur heard. Pulmonary:     Effort: Pulmonary effort is normal. No respiratory distress.     Breath sounds: Normal breath sounds.  Abdominal:     Palpations: Abdomen is soft.     Tenderness: There is no abdominal tenderness. There is no guarding or rebound.  Musculoskeletal:        General: No deformity or signs of injury. Normal range of motion.     Cervical back: Neck supple.     Right lower leg: No edema.     Left lower leg: No edema.  Skin:    General: Skin is warm and dry.  Neurological:     General: No focal deficit present.     Mental Status: She is alert and oriented to person, place, and time.     Cranial Nerves: No cranial nerve deficit.     Sensory: No sensory deficit.     Motor: No weakness.    ED Results / Procedures / Treatments   Labs (all labs ordered are listed, but only abnormal results are  displayed) Labs Reviewed  BASIC METABOLIC PANEL - Abnormal; Notable for the following components:      Result Value   Glucose, Bld 107 (*)    Calcium 8.6 (*)    All other components within normal limits  CBC WITH DIFFERENTIAL/PLATELET - Abnormal; Notable for the following components:   RBC 3.67 (*)    MCV 103.5 (*)    All other components within normal limits  URINALYSIS, ROUTINE W REFLEX MICROSCOPIC - Abnormal; Notable for the following components:   Color, Urine STRAW (*)    All other components within normal limits  PROTIME-INR  CBG MONITORING, ED    EKG EKG Interpretation  Date/Time:  Wednesday March 07 2021 11:58:51 EDT Ventricular Rate:  60 PR Interval:  227 QRS Duration: 144 QT Interval:  500 QTC Calculation: 500 R Axis:   19 Text Interpretation: Sinus rhythm Prolonged PR interval Left bundle branch block Baseline wander in lead(s) V3 No significant change since prior 8/22 Confirmed by Aletta Edouard [phone]) on 03/07/2021 12:02:55 PM  Radiology DG Chest 1 View  Result Date: 03/07/2021 CLINICAL DATA:  Weakness and syncope. EXAM: CHEST  1 VIEW COMPARISON:  12/25/2020 and CT chest 09/15/2020. FINDINGS: Trachea is midline. Heart size stable. Thoracic aorta is calcified. Biapical pleuroparenchymal scarring. Lungs are otherwise clear. No pleural fluid. IMPRESSION: No acute findings. Electronically Signed   By: Lorin Picket M.D.   On: 03/07/2021 12:46    Procedures Procedures   Medications Ordered in ED Medications  sodium chloride 0.9 % bolus 500 mL (0 mLs Intravenous Stopped 03/07/21 1531)    ED Course  I have reviewed the triage vital signs and the nursing notes.  Pertinent labs & imaging results that were available during my care of the patient were reviewed by me and considered in my medical decision making (see chart for details).  Clinical Course as of 03/07/21 1833  Wed Mar 07, 2021  1531 Patient did well with orthostatics.  Daughter is here now and  states she is supposed to be taking the midodrine but has not taken it in over a month.  Recommended that she restart the medication. [MB]    Clinical Course User Index [MB] Hayden Rasmussen, MD   MDM Rules/Calculators/A&P                          This patient complains of syncope and low blood pressure; this involves an extensive number of treatment Options and is a complaint that carries with it a high risk of complications and Morbidity. The differential includes overmedication, anemia, arrhythmia, infection  I ordered, reviewed and interpreted labs, which included CBC with normal white count normal hemoglobin, chemistries fairly normal, urinalysis without signs of infection I ordered medication IV fluids I ordered imaging studies which included chest x-ray and I independently    visualized and interpreted imaging which showed no acute findings Additional history obtained from patient's daughter Previous records obtained and reviewed in epic, prior admissions for syncopy  After the interventions stated above, I reevaluated the patient and found patient to be hemodynamically stable and asymptomatic.  We will.  Patient's daughter explains that patient is not taking her midodrine for orthostatic hypotension.  Patient and daughter comfortable plan for discharge and close follow-up with PCP.  Return instructions discussed   Final Clinical Impression(s) / ED Diagnoses Final diagnoses:  Syncope and collapse  Hypotension, unspecified hypotension type    Rx / DC Orders ED Discharge Orders     None        Hayden Rasmussen, MD 03/07/21 [phone]

## 2021-03-07 NOTE — ED Triage Notes (Signed)
Pt arrives via [facility] with complaints of sudden hypotension today with near syncopy and diaphoretic. No LOC. Failed orthostatic test standing with ems, hypotension sitting down as well. Had similar episode previously. Denies recent med changes or bleeding issues that she is aware of. Reports frequent urination chronically, stopped taking meds for this recently as well but unsure the name

## 2021-03-07 NOTE — Discharge Instructions (Addendum)
You were seen in this emergency department for a syncopal event or fainting spell.  Your blood pressure was low but improved with some time and fluids.  Please continue your regular medications and follow-up with your primary care doctor.  Return to the emergency department if any worsening or concerning symptoms

## 2021-03-09 DIAGNOSIS — E2839 Other primary ovarian failure: Secondary | ICD-10-CM | POA: Diagnosis not present

## 2021-03-11 NOTE — Progress Notes (Signed)
Cardiology Office Note  Date: 03/12/2021   ID: Ragen Laver, DOB 11-12-1925, MRN [id number]  PCP:  Glenda Chroman, MD  Cardiologist:  Rozann Lesches, MD Electrophysiologist:  None   Chief Complaint: Blood pressure management/ED follow-up  History of Present Illness: Debra Richards is a 87 y.o. female with a history of CAD, LBBB, hypertension, atrial fibrillation, MVP.  She was last seen by Dr. Domenic Polite on 10/17/2020 for follow-up visit.  Had recent ER visit in April with shortness of breath.  She was noted to be tachycardia but no obvious findings by chest CTA and high-sensitivity troponin I levels were normal.  She felt weak this morning.  Heart rate was elevated, EKG showed probable sinus tachycardia less likely lead to an ectopic atrial tachycardia.  She was active with her ADLs.  There was discussion with her regarding follow-up cardiac testing.  She had not wanted to pursue a cardiac catheterization.  Ischemic evaluation in 2020 was overall low risk.  She did agree to consider follow-up echocardiogram to ensure no worsening LVEF.  Plan to obtain echo to ensure stable LVEF and no increasing pulmonary pressures.  She had preferred to hold off on cardiac catheterization.Her echocardiogram on October 30, 2020 demonstrated EF of 60 to 65%.  No WMA's, indeterminate diastolic parameters, normal  PASP.  She had a recent ED visit on 03/07/2021 after near syncopal episode.  She was standing in her kitchen when she was diaphoretic and felt weak daughter helped her into a chair and she did not fall.  EMS found of a blood pressure of 70.  This improved after IV fluids.  History of near syncopal episodes in the past.  This was a recurrent problem.  Symptoms were aggravated by standing and relieved by position changes.  She had been taking midodrine but had not taken it in over a month.  She was advised to start taking the medication.  She is here today for follow-up.  She denies any further episodes  of near syncope.  States she sometimes feels a fluttering sensation in her chest when she is lying down with some mild chest discomfort.  Her daughter states she was only taking the midodrine as needed but had stopped for over a month.   Blood pressure today is 104/64.  She does complain of some mild DOE with activity. Recent EKG 03/07/2021 at [facility], ED sinus rhythm with a rate of 60, prolonged PR 0.22 with LBBB.  Since discharge home she has had no further near syncopal episodes.  Past Medical History:  Diagnosis Date   Complication of anesthesia    Trouble waking up   GERD (gastroesophageal reflux disease)    History of kidney stones    Hypothyroidism    LBBB (left bundle branch block)    MVP (mitral valve prolapse)    Osteoporosis    Palpitations    RA (rheumatoid arthritis) ([place])    Right ureteral stone    Rosacea     Past Surgical History:  Procedure Laterality Date   BACK SURGERY     CATARACT EXTRACTION W/ INTRAOCULAR LENS  IMPLANT, BILATERAL  4 yrs ago   both eyes   CHOLECYSTECTOMY  2005   EXTRACORPOREAL SHOCK WAVE LITHOTRIPSY  09-24-10   and 1990   Femur fx     HIP ARTHROPLASTY Right 10/12/2012   Procedure: ARTHROPLASTY BIPOLAR HIP;  Surgeon: Gearlean Alf, MD;  [facility];  Service: Orthopedics;  Laterality: Right;   KNEE ARTHROSCOPY  04/17/2011,  right   Procedure: ARTHROSCOPY KNEE;  Surgeon: Gearlean Alf;  Location: [place];  Service: Orthopedics;  Laterality: Right;  WITH LATERAL MENISCAL DEBRIDEMENT   LUMBAR LAMINECTOMY/DECOMPRESSION MICRODISCECTOMY Right 12/28/2019   Procedure: Right Lumbar Four-Lumbar Five Microdiscectomy;  Surgeon: Erline Levine, MD;  Location: [place];  Service: Neurosurgery;  Laterality: Right;  Right Lumbar Four-Lumbar Five Microdiscectomy   MOLES EXCISED  2012   on nose   Sternum Fx     TOTAL KNEE ARTHROPLASTY  03/23/2012   Procedure: TOTAL KNEE ARTHROPLASTY;  Surgeon: Gearlean Alf, MD;  [facility];   Service: Orthopedics;  Laterality: Right;   VAGINAL HYSTERECTOMY  1970   Leiomyomata    Current Outpatient Medications  Medication Sig Dispense Refill   aspirin EC 81 MG tablet Take 81 mg by mouth daily. Swallow whole.     cholecalciferol (VITAMIN D) 1000 units tablet Take 1,000 Units by mouth daily.      gabapentin (NEURONTIN) 100 MG capsule Take 100 mg by mouth in the morning and at bedtime.     levothyroxine (SYNTHROID, LEVOTHROID) 75 MCG tablet Take 75 mcg by mouth daily before breakfast.     meclizine (ANTIVERT) 25 MG tablet Take 25 mg by mouth 2 (two) times daily as needed for dizziness.     vitamin E 400 UNIT capsule Take 400 Units by mouth daily.      midodrine (PROAMATINE) 2.5 MG tablet Take 1 tablet (2.5 mg total) by mouth 2 (two) times daily with a meal. 60 tablet 2   No current facility-administered medications for this visit.   Allergies:  Penicillins, Contrast media [iodinated diagnostic agents], and Levaquin [levofloxacin in d5w]   Social History: The patient  reports that she has never smoked. She has never used smokeless tobacco. She reports that she does not drink alcohol and does not use drugs.   Family History: The patient's family history includes Cancer in her mother; Crohn's disease in her daughter; Heart disease in her father; Ovarian cancer in her maternal grandmother; Stroke in her father.   ROS:  Please see the history of present illness. Otherwise, complete review of systems is positive for none.  All other systems are reviewed and negative.   Physical Exam: VS:  BP 104/64   Pulse 68   Ht 5\' 5"  (1.651 m)   Wt 136 lb 6.4 oz (61.9 kg)   SpO2 98%   BMI 22.70 kg/m , BMI Body mass index is 22.7 kg/m.  Wt Readings from Last 3 Encounters:  03/12/21 136 lb 6.4 oz (61.9 kg)  03/07/21 135 lb (61.2 kg)  12/25/20 130 lb (59 kg)    General: Patient appears comfortable at rest. Neck: Supple, no elevated JVP or carotid bruits, no thyromegaly. Lungs: Clear to  auscultation, nonlabored breathing at rest. Cardiac: Regular rate and rhythm, no S3 or significant systolic murmur, no pericardial rub. Extremities: No pitting edema, distal pulses 2+. Skin: Warm and dry. Musculoskeletal: No kyphosis. Neuropsychiatric: Alert and oriented x3, affect grossly appropriate.  ECG:  Recent EKG 03/07/2021 at [facility], ED sinus rhythm with a rate of 60, prolonged PR 0.22 with LBBB.  Recent Labwork: 12/25/2020: ALT 17; AST 31; TSH 6.103 03/07/2021: BUN 20; Creatinine, Ser 0.80; Hemoglobin 12.3; Platelets 155; Potassium 3.8; Sodium 139  No results found for: CHOL, TRIG, HDL, CHOLHDL, VLDL, LDLCALC, LDLDIRECT  Other Studies Reviewed Today:  Echocardiogram 10/30/2020  1. Left ventricular ejection fraction, by estimation, is 60 to 65%. The  left ventricle  has normal function. The left ventricle has no regional  wall motion abnormalities. Left ventricular diastolic parameters are  indeterminate.   2. Right ventricular systolic function is normal. The right ventricular  size is normal. There is normal pulmonary artery systolic pressure.   3. The mitral valve is normal in structure. No evidence of mitral valve  regurgitation.   4. The aortic valve is normal in structure. Aortic valve regurgitation is  not visualized.   5. The inferior vena cava is normal in size with greater than 50%  respiratory variability, suggesting right atrial pressure of 3 mmHg.    Echocardiogram 04/08/2019:  1. Left ventricular ejection fraction, by visual estimation, is 55 to  60%. The left ventricle has normal function. There is mildly increased  left ventricular hypertrophy.   2. Abnormal septal motion consistent with left bundle branch block.   3. Left ventricular diastolic parameters are consistent with Grade I  diastolic dysfunction (impaired relaxation).   4. Global right ventricle has normal systolic function.The right  ventricular size is normal. No increase in right ventricular  wall  thickness.   5. Left atrial size was normal.   6. Right atrial size was normal.   7. Small pericardial effusion.   8. The pericardial effusion is anterior to the right ventricle and  surrounding the apex.   9. Mild aortic valve annular calcification.  10. The mitral valve is grossly normal. Trace mitral valve regurgitation.  11. The tricuspid valve is grossly normal. Tricuspid valve regurgitation  is trivial.  12. The aortic valve is tricuspid. Aortic valve regurgitation is not  visualized.  13. The pulmonic valve was grossly normal. Pulmonic valve regurgitation is  trivial.  14. TR signal is inadequate for assessing pulmonary artery systolic  pressure.  15. The inferior vena cava is normal in size with greater than 50%  respiratory variability, suggesting right atrial pressure of 3 mmHg.    Lexiscan Myoview 04/02/2019: Left bundle branch block present throughout with rare PACs and PVCs. Small, mild intensity, mid to basal inferoseptal defect that is partially reversible, suggestive of variable soft tissue attenuation versus mild ischemic territory. Increased TID ratio looks to be due to misregistration rather than actual dilatation. This is a low risk study. Nuclear stress EF: 73%.   Chest CTA 09/15/2020: IMPRESSION: 1. No acute findings in the chest. 2. Mild centrilobular emphysema with bronchiectasis throughout both lungs. 3. 4 mm left lower lobe pulmonary nodule. No follow-up needed if patient is low-risk. Non-contrast chest CT can be considered in 12 months if patient is high-risk. This recommendation follows the consensus statement: Guidelines for Management of Incidental Pulmonary Nodules Detected on CT Images: From the Fleischner Society 2017; Radiology 2017; 284:228-243. 4. Bilateral nephrolithiasis.   Aortic Atherosclerosis (ICD10-I70.0) and Emphysema (ICD10-J43.9). Assessment and Plan:  1. DOE (dyspnea on exertion)   2. LBBB (left bundle branch block)   3.  Postural dizziness with near syncope    1. DOE (dyspnea on exertion) Complains of mild dyspnea on exertion.  Recent echocardiogram On October 30, 2020 demonstrated EF of 60 to 65%.  No WMA's, indeterminate diastolic parameters.   2. LBBB (left bundle branch block) Recent EKG 03/07/2021 at [facility], ED sinus rhythm with a rate of 60, prolonged PR 0.22 with LBBB.   3. Postural dizziness with near syncope Recent near syncopal episode at home.  Patient suddenly felt dizzy and became near syncopal.  Her daughter was able to keep her from falling.  EMS arrived and she had  a blood pressure of 70 systolic.  Went to the emergency room was given IV fluids.  She had not been taking her midodrine for over a month.  She was advised to start back taking her midodrine.  Systolic blood pressures have been in the low 100s recently on recent log daughter brings with her.  We will increase midodrine to 2.5 mg p.o. twice daily.  Advised to monitor her blood pressures.  4.  Palpitations States she has palpitations when laying down at night with some associated mild chest discomfort.  Please get a 14-day ZIO monitor to check for arrhythmias.  Recent near syncopal episode with ED visit  Medication Adjustments/Labs and Tests Ordered: Current medicines are reviewed at length with the patient today.  Concerns regarding medicines are outlined above.   Disposition: Follow-up with Dr. Domenic Polite or APP 77months  Signed, Levell July, NP 03/12/2021 3:53 PM    [facility], [place], [place] [address] Phone: [phone]; Fax: [phone]

## 2021-03-12 ENCOUNTER — Other Ambulatory Visit: Payer: Self-pay | Admitting: Family Medicine

## 2021-03-12 ENCOUNTER — Ambulatory Visit (INDEPENDENT_AMBULATORY_CARE_PROVIDER_SITE_OTHER): Payer: Medicare Other | Admitting: Family Medicine

## 2021-03-12 ENCOUNTER — Other Ambulatory Visit: Payer: Self-pay

## 2021-03-12 ENCOUNTER — Ambulatory Visit (INDEPENDENT_AMBULATORY_CARE_PROVIDER_SITE_OTHER): Payer: Medicare Other

## 2021-03-12 ENCOUNTER — Encounter: Payer: Self-pay | Admitting: Family Medicine

## 2021-03-12 ENCOUNTER — Telehealth: Payer: Self-pay | Admitting: Family Medicine

## 2021-03-12 VITALS — BP 104/64 | HR 68 | Ht 65.0 in | Wt 136.4 lb

## 2021-03-12 DIAGNOSIS — I447 Left bundle-branch block, unspecified: Secondary | ICD-10-CM

## 2021-03-12 DIAGNOSIS — R0609 Other forms of dyspnea: Secondary | ICD-10-CM | POA: Diagnosis not present

## 2021-03-12 DIAGNOSIS — R55 Syncope and collapse: Secondary | ICD-10-CM

## 2021-03-12 DIAGNOSIS — R42 Dizziness and giddiness: Secondary | ICD-10-CM

## 2021-03-12 MED ORDER — MIDODRINE HCL 2.5 MG PO TABS
2.5000 mg | ORAL_TABLET | Freq: Two times a day (BID) | ORAL | 2 refills | Status: DC
Start: 2021-03-12 — End: 2021-06-13

## 2021-03-12 NOTE — Patient Instructions (Signed)
Medication Instructions:  Your physician has recommended you make the following change in your medication:  Take midodrine 2.5 mg twice daily Continue other medications the same  Labwork: none  Testing/Procedures: ZIO- Long Term Monitor Instructions   Your physician has requested you wear your ZIO patch monitor 14 days.   This is a single patch monitor.  [employer] supplies one patch monitor per enrollment.  Additional stickers are not available.   Please do not apply patch if you will be having a Nuclear Stress Test, Echocardiogram, Cardiac CT, MRI, or Chest Xray during the time frame you would be wearing the monitor. The patch cannot be worn during these tests.  You cannot remove and re-apply the ZIO AT patch monitor.   Your ZIO patch monitor will be sent USPS Priority mail from [facility] directly to your home address. The monitor may also be mailed to a PO BOX if home delivery is not available.   It may take 3-5 days to receive your monitor after you have been enrolled.   Once you have received you monitor, please review enclosed instructions.  Your monitor has already been registered assigning a specific monitor serial # to you.   Applying the monitor   Shave hair from upper left chest.   Hold abrader disc by orange tab.  Rub abrader in 40 strokes over left upper chest as indicated in your monitor instructions.   Clean area with 4 enclosed alcohol pads .  Use all pads to assure are is cleaned thoroughly.  Let dry.   Apply patch as indicated in monitor instructions.  Patch will be place under collarbone on left side of chest with arrow pointing upward.   Rub patch adhesive wings for 2 minutes.Remove white label marked "1".  Remove white label marked "2".  Rub patch adhesive wings for 2 additional minutes.   While looking in a mirror, press and release button in center of patch.  A small green light will flash 3-4 times .  This will be your only indicator the monitor has  been turned on.     Do not shower for the first 24 hours.  You may shower after the first 24 hours.   Press button if you feel a symptom. You will hear a small click.  Record Date, Time and Symptom in the Patient Log Book.   When you are ready to remove patch, follow instructions on last 2 pages of Patient Log Book.  Stick patch monitor onto last page of Patient Log Book.   Place Patient Log Book in [place] box.  Use locking tab on box and tape box closed securely.  The [employer] and [employer] has [employer] on it.  Please place in mailbox as soon as possible.  Your physician should have your test results approximately 7 days after the monitor has been mailed back to [facility].   Call [place] at [phone] if you have questions regarding your ZIO AT patch monitor.  Call them immediately if you see an orange light blinking on your monitor.   If your monitor falls off in less than 4 days contact our Monitor department at [phone].  If your monitor becomes loose or falls off after 4 days call [employer] at [phone] for suggestions on securing your monitor.  Follow-Up: Your physician recommends that you schedule a follow-up appointment in: 2 months  Any Other Special Instructions Will Be Listed Below (If Applicable).  If you need a refill on your cardiac  medications before your next appointment, please call your pharmacy.

## 2021-03-12 NOTE — Telephone Encounter (Signed)
PERCERT:   14 DAY ZIO  

## 2021-03-15 ENCOUNTER — Encounter: Payer: Self-pay | Admitting: Obstetrics & Gynecology

## 2021-03-15 ENCOUNTER — Telehealth: Payer: Self-pay | Admitting: *Deleted

## 2021-03-15 DIAGNOSIS — R55 Syncope and collapse: Secondary | ICD-10-CM | POA: Diagnosis not present

## 2021-03-15 NOTE — Telephone Encounter (Signed)
PROLIA GIVEN 09/05/2020 NEXT INJECTION 03/08/2021

## 2021-03-15 NOTE — Telephone Encounter (Addendum)
Deductible $233 ($233 MET)  OOP MAX N/A  Annual exam 09/05/2020  Calcium  9.6           Date 03/22/2021 scanned in system  Upcoming dental procedures NO  Prior Authorization needed NO  Pt estimated Cost $0  Appt 04/05/2021 at 2:30     Coverage Details:: This is a Medicare Supplement Plan G and it covers the Medicare Part B coinsurance and 100% of the excess charges. This plan does not cover the Medicare Part B deductible of $233.00 which $233.00 is met '

## 2021-03-15 NOTE — Telephone Encounter (Signed)
Patient is due for Prolia last calcium level is 8.6. Date  03/07/2021. I will route to Provider for recommendations on how to proceed before we give Prolia due to low calcium level.

## 2021-03-19 DIAGNOSIS — Z961 Presence of intraocular lens: Secondary | ICD-10-CM | POA: Diagnosis not present

## 2021-03-19 DIAGNOSIS — H40013 Open angle with borderline findings, low risk, bilateral: Secondary | ICD-10-CM | POA: Diagnosis not present

## 2021-03-19 DIAGNOSIS — H43812 Vitreous degeneration, left eye: Secondary | ICD-10-CM | POA: Diagnosis not present

## 2021-03-19 NOTE — Telephone Encounter (Signed)
Left message for pt to return our call. 

## 2021-03-20 NOTE — Telephone Encounter (Signed)
Debra Bruins, MD  You 2 days ago   Ca++ 1.5 g/day and repeat Ca++ in 2 weeks.

## 2021-03-20 NOTE — Telephone Encounter (Signed)
Pt understands and she will have Labs drawn at her Provider office closer to home. Courtesy call 04/03/2021 to see if she had her labs drawn

## 2021-03-21 ENCOUNTER — Encounter: Payer: Self-pay | Admitting: *Deleted

## 2021-03-21 DIAGNOSIS — Z299 Encounter for prophylactic measures, unspecified: Secondary | ICD-10-CM | POA: Diagnosis not present

## 2021-03-21 DIAGNOSIS — N1831 Chronic kidney disease, stage 3a: Secondary | ICD-10-CM | POA: Diagnosis not present

## 2021-03-21 DIAGNOSIS — I951 Orthostatic hypotension: Secondary | ICD-10-CM | POA: Diagnosis not present

## 2021-03-21 DIAGNOSIS — M25551 Pain in right hip: Secondary | ICD-10-CM | POA: Diagnosis not present

## 2021-03-21 DIAGNOSIS — I700 Atherosclerosis of aorta: Secondary | ICD-10-CM | POA: Diagnosis not present

## 2021-03-21 NOTE — Progress Notes (Signed)
Hello,     This is a Psychologist, forensic to inform you the patient's Zio AT device is in leads off.  We attempted to troubleshoot but could not resolve the issue.  A replacement has been ordered per the instruction we have under this account as the patient wore for about 6 days out of the 14 day prescription.  There are notes to overnight the replacement.       Account: [place]  Patient Initials: DG  Patient ID: [id number]  Asset SN: [id number]     If you have any questions, please feel free to contact us at [phone] and reference: [id number]     Thank you,  [facility] Customer Care

## 2021-04-04 IMAGING — CR DG LUMBAR SPINE 2-3V
2 series · 2 of 2 positions shown · non-contrast
Comparison: 06/09/2019

CLINICAL DATA: Intraoperative localization for micro discectomy

EXAM:
LUMBAR SPINE - 2-3 VIEW

[xtable lateral (1 of 2)]
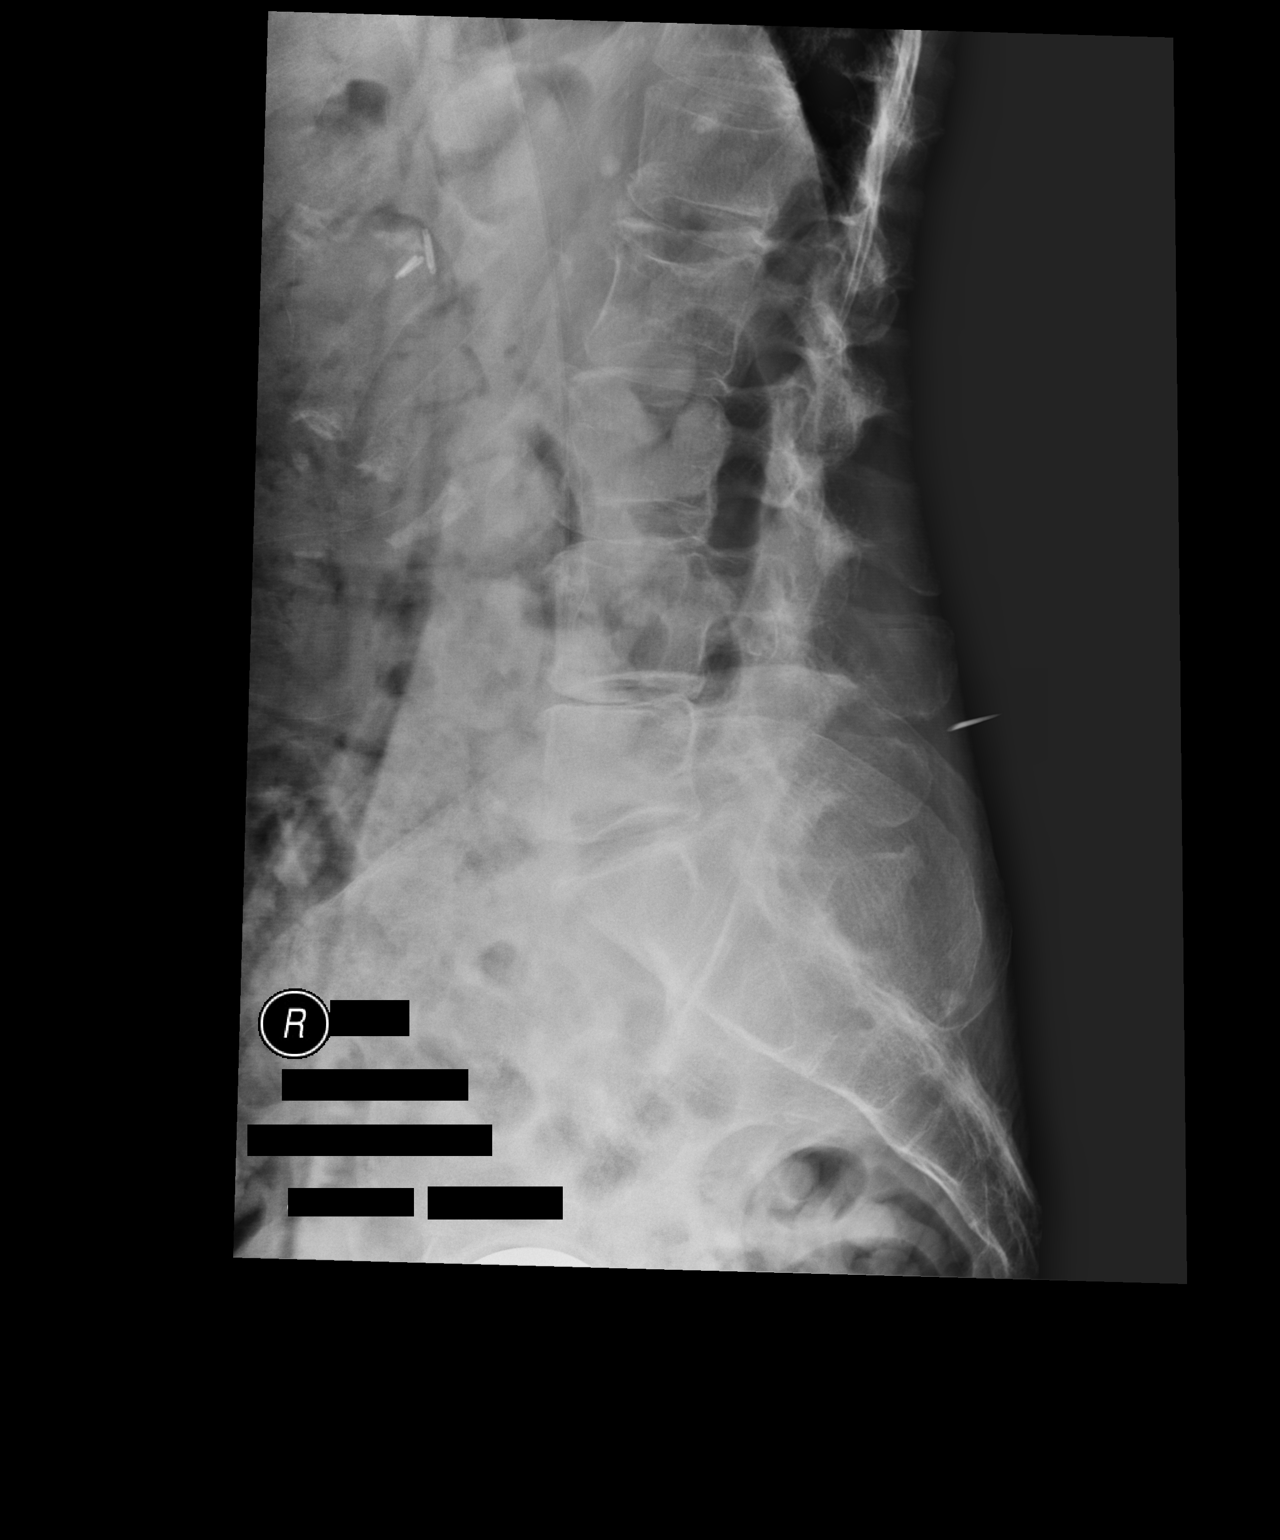

[xtable lateral (2 of 2)]
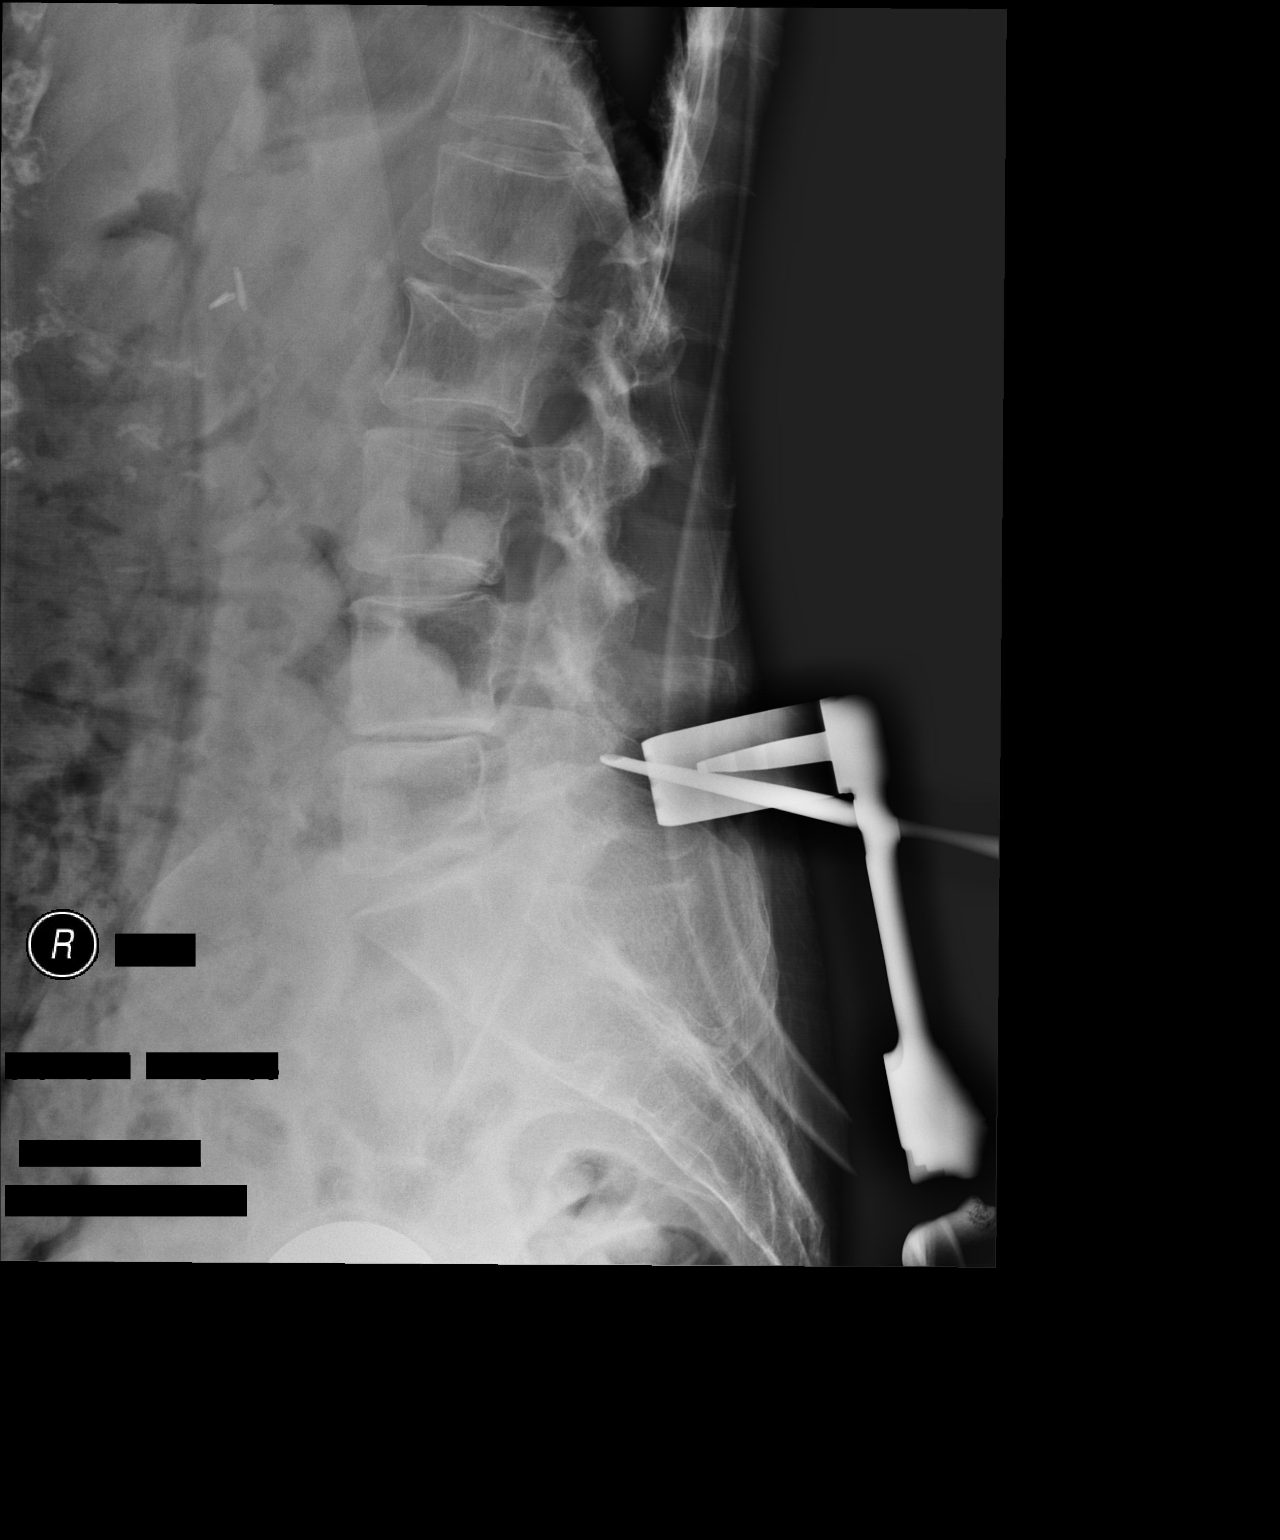

[2 of 2 positions shown; findings below may reference images not displayed]

FINDINGS: Five lumbar type vertebral bodies are well visualized. A needle is
noted within the posterior soft tissues between the L4 and L5
spinous processes. Subsequent images demonstrate surgical retractors
and instruments at L4-5.
IMPRESSION: Intraoperative localization at L4-5.

## 2021-04-05 ENCOUNTER — Ambulatory Visit (INDEPENDENT_AMBULATORY_CARE_PROVIDER_SITE_OTHER): Payer: Medicare Other | Admitting: *Deleted

## 2021-04-05 ENCOUNTER — Other Ambulatory Visit: Payer: Self-pay

## 2021-04-05 DIAGNOSIS — M810 Age-related osteoporosis without current pathological fracture: Secondary | ICD-10-CM

## 2021-04-05 MED ORDER — DENOSUMAB 60 MG/ML ~~LOC~~ SOSY
60.0000 mg | PREFILLED_SYRINGE | Freq: Once | SUBCUTANEOUS | Status: AC
  Administered 2021-04-05: 60 mg via SUBCUTANEOUS

## 2021-04-11 NOTE — Telephone Encounter (Signed)
PROLIA GIVEN 04/05/2021 NEXT INJECTION 10/04/2021

## 2021-04-25 DIAGNOSIS — K219 Gastro-esophageal reflux disease without esophagitis: Secondary | ICD-10-CM | POA: Diagnosis not present

## 2021-04-25 DIAGNOSIS — K589 Irritable bowel syndrome without diarrhea: Secondary | ICD-10-CM | POA: Diagnosis not present

## 2021-04-26 ENCOUNTER — Encounter: Payer: Self-pay | Admitting: Urology

## 2021-04-26 ENCOUNTER — Telehealth: Payer: Self-pay | Admitting: *Deleted

## 2021-04-26 ENCOUNTER — Ambulatory Visit (INDEPENDENT_AMBULATORY_CARE_PROVIDER_SITE_OTHER): Payer: Medicare Other | Admitting: Urology

## 2021-04-26 ENCOUNTER — Other Ambulatory Visit: Payer: Self-pay

## 2021-04-26 VITALS — BP 118/70 | HR 60

## 2021-04-26 DIAGNOSIS — R3915 Urgency of urination: Secondary | ICD-10-CM

## 2021-04-26 DIAGNOSIS — N200 Calculus of kidney: Secondary | ICD-10-CM | POA: Diagnosis not present

## 2021-04-26 DIAGNOSIS — N3941 Urge incontinence: Secondary | ICD-10-CM | POA: Diagnosis not present

## 2021-04-26 DIAGNOSIS — R351 Nocturia: Secondary | ICD-10-CM

## 2021-04-26 LAB — URINALYSIS, ROUTINE W REFLEX MICROSCOPIC
Bilirubin, UA: NEGATIVE
Glucose, UA: NEGATIVE
Ketones, UA: NEGATIVE
Leukocytes,UA: NEGATIVE
Nitrite, UA: NEGATIVE
Protein,UA: NEGATIVE
RBC, UA: NEGATIVE
Specific Gravity, UA: 1.02 (ref 1.005–1.030)
Urobilinogen, Ur: 0.2 mg/dL (ref 0.2–1.0)
pH, UA: 6 (ref 5.0–7.5)

## 2021-04-26 LAB — BLADDER SCAN AMB NON-IMAGING

## 2021-04-26 MED ORDER — GEMTESA 75 MG PO TABS: 75.0000 mg | ORAL_TABLET | Freq: Every day | ORAL | 0 refills | Status: DC

## 2021-04-26 NOTE — Progress Notes (Signed)
Subjective:  1. Urge incontinence   2. Urgency of urination   3. Nocturia   4. Renal stones      HPI: Debra Richards is a 87 year-old female established patient who is here for follow up of incontinence and stones. .   04/26/21: Debra Richards returns today today in f/u.  She was taken off of the Myrbetriq because of issues with orthostasis requiring hospitalization.   She has nocturia q1-2 hr and frequency.  She wears depends at night at pads during the day.  She has a history of stones but no flank pain or hematuria.  A CT in 8/22 showed stable bilateral renal stones with no change since 2017.    10/26/20: Debra Richards was given Myrbetriq 50mg  at her last visit and she is doing well with reduced incontinence.  She has cut it to 25mg  because of expense.  She held it and her symptoms recurred.   She didn't get the KUB that was ordered for prior to this visit.   She had a CT Chest in 4/22 that showed bilateral renal stones without obstruction.   A Lumbar MRI in 1/22 also showed the stone.  She has had no hematuria.  She has some right sided pain but she has a history of lumbar spine disease and had surgery in 8/21.    GU Hx: Debra Richards returns today with the complaint of progressive incontinence over the last 3 years but it has been present for a decade. She has tried [place] and [employer] without success. She remains Myrbetriq 50mg  which helps but she still has nocturia x 4-5x.  She has urgency with UUI.  She has had no flank pain or hematuria and her UA is ok today.    She has had PTNS and Botox discussed but hasn't done either. She has marked frequency and urgency and will have UUI. She doesn't have SUI. She has nocturia with enuresis. She has no dysuria or hematuria. She has a history of stones with occasional mild right flank pain. She last passed a stone 4+ years ago. She has DDD with chronic back pain and is recovering from recent surgery by Dr. Vertell Limber. Her UA is ok.        ROS:  ROS:  A complete review  of systems was performed.  All systems are negative except for pertinent findings as noted.   ROS  Allergies  Allergen Reactions   Penicillins Hives    03/04/13: Pt has tolerated Amoxicillin/Unsyn without reaction   Contrast Media [Iodinated Diagnostic Agents] Other (See Comments)    Arm swelled up and was painful after IVP years ago/    Levaquin [Levofloxacin In D5w] Other (See Comments)    Made her sick all over    Outpatient Encounter Medications as of 04/26/2021  Medication Sig Note   aspirin EC 81 MG tablet Take 81 mg by mouth daily. Swallow whole.    cholecalciferol (VITAMIN D) 1000 units tablet Take 1,000 Units by mouth daily.     gabapentin (NEURONTIN) 100 MG capsule Take 100 mg by mouth in the morning and at bedtime.    levothyroxine (SYNTHROID, LEVOTHROID) 75 MCG tablet Take 75 mcg by mouth daily before breakfast.    meclizine (ANTIVERT) 25 MG tablet Take 25 mg by mouth 2 (two) times daily as needed for dizziness. 03/07/2021: Takes rarely   midodrine (PROAMATINE) 2.5 MG tablet Take 1 tablet (2.5 mg total) by mouth 2 (two) times daily with a meal.    omeprazole (PRILOSEC) 40 MG  capsule Take 40 mg by mouth daily.    Vibegron (GEMTESA) 75 MG TABS Take 75 mg by mouth daily.    vitamin E 400 UNIT capsule Take 400 Units by mouth daily.     No facility-administered encounter medications on file as of 04/26/2021.    Past Medical History:  Diagnosis Date   Complication of anesthesia    Trouble waking up   GERD (gastroesophageal reflux disease)    History of kidney stones    Hypothyroidism    LBBB (left bundle branch block)    MVP (mitral valve prolapse)    Osteoporosis    Palpitations    RA (rheumatoid arthritis) ([place])    Right ureteral stone    Rosacea     Past Surgical History:  Procedure Laterality Date   BACK SURGERY     CATARACT EXTRACTION W/ INTRAOCULAR LENS  IMPLANT, BILATERAL  4 yrs ago   both eyes   CHOLECYSTECTOMY  2005   EXTRACORPOREAL SHOCK WAVE  LITHOTRIPSY  09-24-10   and 1990   Femur fx     HIP ARTHROPLASTY Right 10/12/2012   Procedure: ARTHROPLASTY BIPOLAR HIP;  Surgeon: Gearlean Alf, MD;  Location: [facility];  Service: Orthopedics;  Laterality: Right;   KNEE ARTHROSCOPY  04/17/2011, right   Procedure: ARTHROSCOPY KNEE;  Surgeon: Gearlean Alf;  Location: [place];  Service: Orthopedics;  Laterality: Right;  WITH LATERAL MENISCAL DEBRIDEMENT   LUMBAR LAMINECTOMY/DECOMPRESSION MICRODISCECTOMY Right 12/28/2019   Procedure: Right Lumbar Four-Lumbar Five Microdiscectomy;  Surgeon: Erline Levine, MD;  Location: [place];  Service: Neurosurgery;  Laterality: Right;  Right Lumbar Four-Lumbar Five Microdiscectomy   MOLES EXCISED  2012   on nose   Sternum Fx     TOTAL KNEE ARTHROPLASTY  03/23/2012   Procedure: TOTAL KNEE ARTHROPLASTY;  Surgeon: Gearlean Alf, MD;  Location: [facility];  Service: Orthopedics;  Laterality: Right;   VAGINAL HYSTERECTOMY  1970   Leiomyomata    Social History   Socioeconomic History   Marital status: Widowed    Spouse name: Not on file   Number of children: Not on file   Years of education: Not on file   Highest education level: Not on file  Occupational History   Not on file  Tobacco Use   Smoking status: Never   Smokeless tobacco: Never  Vaping Use   Vaping Use: Never used  Substance and Sexual Activity   Alcohol use: No    Alcohol/week: 0.0 standard drinks   Drug use: No   Sexual activity: Not Currently    Birth control/protection: Post-menopausal, Surgical    Comment: 1st intercourse 14 yo-2 partners  Other Topics Concern   Not on file  Social History Narrative   Lives in [place].   Lives c 2nd husband    NOK-Gina Axson   Social Determinants of Health   Financial Resource Strain: Not on file  Food Insecurity: Not on file  Transportation Needs: Not on file  Physical Activity: Not on file  Stress: Not on file  Social Connections: Not on file  Intimate Partner Violence:  Not on file    Family History  Problem Relation Age of Onset   Cancer Mother        COLON   Heart disease Father        STROKE   Stroke Father    Ovarian cancer Maternal Grandmother    Crohn's disease Daughter        Objective: Vitals:  04/26/21 1345  BP: 118/70  Pulse: 60     Physical Exam  Lab Results:  Results for orders placed or performed in visit on 04/26/21 (from the past 24 hour(s))  Urinalysis, Routine w reflex microscopic     Status: None   Collection Time: 04/26/21  3:28 PM  Result Value Ref Range   Specific Gravity, UA 1.020 1.005 - 1.030   pH, UA 6.0 5.0 - 7.5   Color, UA Yellow Yellow   Appearance Ur Clear Clear   Leukocytes,UA Negative Negative   Protein,UA Negative Negative/Trace   Glucose, UA Negative Negative   Ketones, UA Negative Negative   RBC, UA Negative Negative   Bilirubin, UA Negative Negative   Urobilinogen, Ur 0.2 0.2 - 1.0 mg/dL   Nitrite, UA Negative Negative   Microscopic Examination Comment    Narrative   Performed at:  [facility] [address], [place], [place]  [id number] Lab Director: [place], Phone:  [phone]     BMET   Cr was 0.80 on 12/30/19  UA has 6-10 WBC and mod bacteria. Results for orders placed or performed in visit on 04/26/21 (from the past 24 hour(s))  Urinalysis, Routine w reflex microscopic     Status: None   Collection Time: 04/26/21  3:28 PM  Result Value Ref Range   Specific Gravity, UA 1.020 1.005 - 1.030   pH, UA 6.0 5.0 - 7.5   Color, UA Yellow Yellow   Appearance Ur Clear Clear   Leukocytes,UA Negative Negative   Protein,UA Negative Negative/Trace   Glucose, UA Negative Negative   Ketones, UA Negative Negative   RBC, UA Negative Negative   Bilirubin, UA Negative Negative   Urobilinogen, Ur 0.2 0.2 - 1.0 mg/dL   Nitrite, UA Negative Negative   Microscopic Examination Comment    Narrative   Performed at:  [facility] [address],  [place], [place]  [id number] Lab Director: [place], Phone:  [phone]     Studies/Results:  I have reviewed her CT from August.   Assessment & Plan: OAB wet.   I have given her Gemtesa samples to take 1/2 - 1 tab daily.   It has a lower side effect profile than Gemtesa.   Kidney stones.   The bilateral stones were stable over the last 5 years on CT from 8/22.  Meds ordered this encounter  Medications   Vibegron (GEMTESA) 75 MG TABS    Sig: Take 75 mg by mouth daily.    Dispense:  28 tablet    Refill:  0      Orders Placed This Encounter  Procedures   Urinalysis, Routine w reflex microscopic   BLADDER SCAN AMB NON-IMAGING      Return in about 3 months (around 07/25/2021).   CC: Glenda Chroman, MD      Irine Seal 04/26/2021 Patient ID: Debra Richards, female   DOB: 1925-08-07, 87 y.o.   MRN: [id number] Patient ID: Debra Richards, female   DOB: 1925/12/06, 87 y.o.   MRN: [id number]

## 2021-04-26 NOTE — Progress Notes (Signed)
Urological Symptom Review  Patient is experiencing the following symptoms: Frequent urination Get up at night to urinate Leakage of urine   Review of Systems  Gastrointestinal (upper)  : Negative for upper GI symptoms  Gastrointestinal (lower) : Negative for lower GI symptoms  Constitutional : Negative for symptoms  Skin: Negative for skin symptoms  Eyes: Negative for eye symptoms  Ear/Nose/Throat : Negative for Ear/Nose/Throat symptoms  Hematologic/Lymphatic: Negative for Hematologic/Lymphatic symptoms  Cardiovascular : Negative for cardiovascular symptoms  Respiratory : Negative for respiratory symptoms  Endocrine: Negative for endocrine symptoms  Musculoskeletal: Joint pain  Neurological: Negative for neurological symptoms  Psychologic: Negative for psychiatric symptoms

## 2021-04-26 NOTE — Telephone Encounter (Signed)
-----   Message from Laurine Blazer, LPN sent at [id number]/38/3779  5:46 PM EST -----  ----- Message ----- From: Verta Ellen., NP Sent: 04/16/2021   4:56 PM EST To: Laurine Blazer, LPN  Please call the patient and let her know the cardiac monitor showed no significant arrhythmias or pauses.  She had some rare premature beats otherwise normal.  Verta Ellen, NP  04/16/2021 4:55 PM

## 2021-04-26 NOTE — Telephone Encounter (Signed)
Spoke with patient about 2nd monitor that was sent out due to being unable to wear first monitor due to causing skin to come off. Patient says she will not wear second monitor and will send back to [employer].

## 2021-05-17 ENCOUNTER — Ambulatory Visit: Payer: Medicare Other | Admitting: Cardiology

## 2021-05-25 DIAGNOSIS — K589 Irritable bowel syndrome without diarrhea: Secondary | ICD-10-CM | POA: Diagnosis not present

## 2021-06-12 DIAGNOSIS — N183 Chronic kidney disease, stage 3 unspecified: Secondary | ICD-10-CM | POA: Diagnosis not present

## 2021-06-12 DIAGNOSIS — I700 Atherosclerosis of aorta: Secondary | ICD-10-CM | POA: Diagnosis not present

## 2021-06-12 DIAGNOSIS — Z299 Encounter for prophylactic measures, unspecified: Secondary | ICD-10-CM | POA: Diagnosis not present

## 2021-06-12 DIAGNOSIS — E039 Hypothyroidism, unspecified: Secondary | ICD-10-CM | POA: Diagnosis not present

## 2021-06-12 DIAGNOSIS — M159 Polyosteoarthritis, unspecified: Secondary | ICD-10-CM | POA: Diagnosis not present

## 2021-06-13 ENCOUNTER — Other Ambulatory Visit: Payer: Self-pay | Admitting: *Deleted

## 2021-06-13 MED ORDER — MIDODRINE HCL 2.5 MG PO TABS: 2.5000 mg | ORAL_TABLET | Freq: Two times a day (BID) | ORAL | 1 refills | Status: DC

## 2021-06-26 DIAGNOSIS — K589 Irritable bowel syndrome without diarrhea: Secondary | ICD-10-CM | POA: Diagnosis not present

## 2021-06-26 DIAGNOSIS — I10 Essential (primary) hypertension: Secondary | ICD-10-CM | POA: Diagnosis not present

## 2021-07-11 ENCOUNTER — Encounter: Payer: Self-pay | Admitting: Cardiology

## 2021-07-11 ENCOUNTER — Ambulatory Visit: Payer: Medicare Other | Admitting: Cardiology

## 2021-07-11 ENCOUNTER — Other Ambulatory Visit: Payer: Self-pay

## 2021-07-11 VITALS — BP 104/62 | HR 56 | Ht 65.0 in | Wt 136.0 lb

## 2021-07-11 DIAGNOSIS — R002 Palpitations: Secondary | ICD-10-CM | POA: Diagnosis not present

## 2021-07-11 DIAGNOSIS — I951 Orthostatic hypotension: Secondary | ICD-10-CM

## 2021-07-11 DIAGNOSIS — R0609 Other forms of dyspnea: Secondary | ICD-10-CM

## 2021-07-11 MED ORDER — MIDODRINE HCL 2.5 MG PO TABS: 2.5000 mg | ORAL_TABLET | Freq: Two times a day (BID) | ORAL | 5 refills | Status: DC

## 2021-07-11 NOTE — Patient Instructions (Addendum)

## 2021-07-11 NOTE — Progress Notes (Signed)
Cardiology Office Note  Date: 07/11/2021   ID: Debra Richards, DOB 1925/09/02, MRN [id number]  PCP:  Glenda Chroman, MD  Cardiologist:  Rozann Lesches, MD Electrophysiologist:  None   Chief Complaint  Patient presents with   Cardiac follow-up    History of Present Illness: Debra Richards is a 88 y.o. female last seen in October 2022 by Mr. Leonides Sake NP.  She is here for a follow-up visit.  Reports no chest pain, stable dyspnea on exertion, remains fairly active as before with ADLs.  She sees her children regularly, eats lunch with her son once a week.  I reviewed her medications which are noted below.  She has been consistent with use of midodrine.  Blood pressure low normal range today.  No sudden dizziness or syncope.  I did review interval cardiac testing including her echocardiogram and cardiac monitor noted below.  Past Medical History:  Diagnosis Date   Complication of anesthesia    Trouble waking up   GERD (gastroesophageal reflux disease)    History of kidney stones    Hypothyroidism    LBBB (left bundle branch block)    MVP (mitral valve prolapse)    Osteoporosis    Palpitations    RA (rheumatoid arthritis) ([place])    Right ureteral stone    Rosacea     Past Surgical History:  Procedure Laterality Date   BACK SURGERY     CATARACT EXTRACTION W/ INTRAOCULAR LENS  IMPLANT, BILATERAL  4 yrs ago   both eyes   CHOLECYSTECTOMY  2005   EXTRACORPOREAL SHOCK WAVE LITHOTRIPSY  09-24-10   and 1990   Femur fx     HIP ARTHROPLASTY Right 10/12/2012   Procedure: ARTHROPLASTY BIPOLAR HIP;  Surgeon: Gearlean Alf, MD;  [facility];  Service: Orthopedics;  Laterality: Right;   KNEE ARTHROSCOPY  04/17/2011, right   Procedure: ARTHROSCOPY KNEE;  Surgeon: Gearlean Alf;  Location: [place];  Service: Orthopedics;  Laterality: Right;  WITH LATERAL MENISCAL DEBRIDEMENT   LUMBAR LAMINECTOMY/DECOMPRESSION MICRODISCECTOMY Right 12/28/2019   Procedure: Right  Lumbar Four-Lumbar Five Microdiscectomy;  Surgeon: Erline Levine, MD;  Location: [place];  Service: Neurosurgery;  Laterality: Right;  Right Lumbar Four-Lumbar Five Microdiscectomy   MOLES EXCISED  2012   on nose   Sternum Fx     TOTAL KNEE ARTHROPLASTY  03/23/2012   Procedure: TOTAL KNEE ARTHROPLASTY;  Surgeon: Gearlean Alf, MD;  [facility];  Service: Orthopedics;  Laterality: Right;   VAGINAL HYSTERECTOMY  1970   Leiomyomata    Current Outpatient Medications  Medication Sig Dispense Refill   aspirin EC 81 MG tablet Take 81 mg by mouth daily. Swallow whole.     cholecalciferol (VITAMIN D) 1000 units tablet Take 1,000 Units by mouth daily.      levothyroxine (SYNTHROID, LEVOTHROID) 75 MCG tablet Take 75 mcg by mouth daily before breakfast.     meclizine (ANTIVERT) 25 MG tablet Take 25 mg by mouth 2 (two) times daily as needed for dizziness.     midodrine (PROAMATINE) 2.5 MG tablet Take 1 tablet (2.5 mg total) by mouth 2 (two) times daily with a meal. 60 tablet 1   vitamin E 400 UNIT capsule Take 400 Units by mouth daily.      Zinc 50 MG CAPS Take 1 capsule by mouth daily.     gabapentin (NEURONTIN) 100 MG capsule Take 100 mg by mouth in the morning and at bedtime. (Patient not taking: Reported  on 07/11/2021)     omeprazole (PRILOSEC) 40 MG capsule Take 40 mg by mouth daily. (Patient not taking: Reported on 07/11/2021)     Vibegron (GEMTESA) 75 MG TABS Take 75 mg by mouth daily. (Patient not taking: Reported on 07/11/2021) 28 tablet 0   No current facility-administered medications for this visit.   Allergies:  Penicillins, Contrast media [iodinated contrast media], and Levaquin [levofloxacin in d5w]   ROS: No orthopnea or PND.  Physical Exam: VS:  BP 104/62    Pulse (!) 56    Ht 5\' 5"  (1.651 m)    Wt 136 lb (61.7 kg)    SpO2 100%    BMI 22.63 kg/m , BMI Body mass index is 22.63 kg/m.  Wt Readings from Last 3 Encounters:  07/11/21 136 lb (61.7 kg)  03/12/21 136 lb 6.4 oz (61.9  kg)  03/07/21 135 lb (61.2 kg)    General: Patient appears comfortable at rest. HEENT: Conjunctiva and lids normal, wearing a mask. Neck: Supple, no elevated JVP or carotid bruits, no thyromegaly. Lungs: Clear to auscultation, nonlabored breathing at rest. Cardiac: Regular rate and rhythm, no S3, 1/6 systolic murmur, no pericardial rub. Extremities: No pitting edema.  ECG:  An ECG dated 03/07/2021 was personally reviewed today and demonstrated:  Sinus rhythm with prolonged PR interval and left bundle branch block.  Recent Labwork: 12/25/2020: ALT 17; AST 31; TSH 6.103 03/07/2021: BUN 20; Creatinine, Ser 0.80; Hemoglobin 12.3; Platelets 155; Potassium 3.8; Sodium 139   Other Studies Reviewed Today:  Lexiscan Myoview 04/02/2019: Left bundle branch block present throughout with rare PACs and PVCs. Small, mild intensity, mid to basal inferoseptal defect that is partially reversible, suggestive of variable soft tissue attenuation versus mild ischemic territory. Increased TID ratio looks to be due to misregistration rather than actual dilatation. This is a low risk study. Nuclear stress EF: 73%.  Echocardiogram 10/30/2020:  1. Left ventricular ejection fraction, by estimation, is 60 to 65%. The  left ventricle has normal function. The left ventricle has no regional  wall motion abnormalities. Left ventricular diastolic parameters are  indeterminate.   2. Right ventricular systolic function is normal. The right ventricular  size is normal. There is normal pulmonary artery systolic pressure.   3. The mitral valve is normal in structure. No evidence of mitral valve  regurgitation.   4. The aortic valve is normal in structure. Aortic valve regurgitation is  not visualized.   5. The inferior vena cava is normal in size with greater than 50%  respiratory variability, suggesting right atrial pressure of 3 mmHg.   Cardiac monitor November 2022: ZIO AT reviewed.  21 hours analyzed after removal of  artifact.  Predominant rhythm is sinus with IVCD.  Heart rate ranged from 56 bpm up to 84 bpm and average heart rate 63 bpm.  There were rare PACs including triplets representing less than 1% total beats.  There were rare PVCs representing less than 1% total beats.  No significant arrhythmias or pauses.  Assessment and Plan:  1.  Intermittent palpitations, atrial and ventricular ectopy noted by cardiac monitor without sustained arrhythmias.  Holding off on any specific AV nodal blockers in light of low normal blood pressure and propensity to orthostasis.  LVEF normal as well.  Would continue with observation.  2.  Orthostatic hypotension, symptomatically stable on low-dose midodrine.  We will continue with current management.  I reinforced compliance with her medication.  Medication Adjustments/Labs and Tests Ordered: Current medicines are reviewed at length  with the patient today.  Concerns regarding medicines are outlined above.   Tests Ordered: No orders of the defined types were placed in this encounter.   Medication Changes: No orders of the defined types were placed in this encounter.   Disposition:  Follow up  6 months.  Signed, Satira Sark, MD, [facility] 07/11/2021 4:45 PM    [facility] [place], [place], [place] [address] Phone: [phone]; Fax: [phone]

## 2021-07-30 ENCOUNTER — Ambulatory Visit: Payer: Medicare Other

## 2021-08-02 ENCOUNTER — Ambulatory Visit: Payer: Medicare Other | Admitting: Urology

## 2021-09-20 ENCOUNTER — Ambulatory Visit (INDEPENDENT_AMBULATORY_CARE_PROVIDER_SITE_OTHER): Payer: Medicare Other | Admitting: Urology

## 2021-09-20 VITALS — BP 128/71 | HR 69

## 2021-09-20 DIAGNOSIS — R3915 Urgency of urination: Secondary | ICD-10-CM | POA: Diagnosis not present

## 2021-09-20 DIAGNOSIS — N3941 Urge incontinence: Secondary | ICD-10-CM | POA: Diagnosis not present

## 2021-09-20 DIAGNOSIS — N200 Calculus of kidney: Secondary | ICD-10-CM | POA: Diagnosis not present

## 2021-09-20 DIAGNOSIS — R351 Nocturia: Secondary | ICD-10-CM

## 2021-09-20 NOTE — Progress Notes (Signed)
?Subjective: ? ?1. Urge incontinence   ?2. Urgency of urination   ?3. Nocturia   ?4. Renal stones   ?  ? ?HPI: Latreshia Beauchaine is a 88 year-old female established patient who is here for follow up of incontinence and stones.  ? ?09/20/21: Jazline returns today in f/u for her history of incontinence and prior stones.  Her UA is clear.  She has frequency and nocturia q1-2 hrs.  She continues to wear pads. She was given Logan Bores and had some bloating so she stopped that.  She has had no hematuria or flank pain..  ? ?04/26/21: Orissa returns today today in f/u.  She was taken off of the Myrbetriq because of issues with orthostasis requiring hospitalization.   She has nocturia q1-2 hr and frequency.  She wears depends at night at pads during the day.  She has a history of stones but no flank pain or hematuria.  A CT in 8/22 showed stable bilateral renal stones with no change since 2017.   ? ?10/26/20: Naisha was given Myrbetriq '50mg'$  at her last visit and she is doing well with reduced incontinence.  She has cut it to '25mg'$  because of expense.  She held it and her symptoms recurred.   She didn't get the KUB that was ordered for prior to this visit.   She had a CT Chest in 4/22 that showed bilateral renal stones without obstruction.   A Lumbar MRI in 1/22 also showed the stone.  She has had no hematuria.  She has some right sided pain but she has a history of lumbar spine disease and had surgery in 8/21.   ? ?GU Hx: Kailoni returns today with the complaint of progressive incontinence over the last 3 years but it has been present for a decade. She has tried [place] and [employer] without success. She remains Myrbetriq '50mg'$  which helps but she still has nocturia x 4-5x.  She has urgency with UUI.  She has had no flank pain or hematuria and her UA is ok today.  ? ? She has had PTNS and Botox discussed but hasn't done either. She has marked frequency and urgency and will have UUI. She doesn't have SUI. She has nocturia with  enuresis. She has no dysuria or hematuria. She has a history of stones with occasional mild right flank pain. She last passed a stone 4+ years ago. She has DDD with chronic back pain and is recovering from recent surgery by Dr. Vertell Limber. Her UA is ok.  ?  ? ?  ? ?ROS: ? ?ROS:  ?A complete review of systems was performed.  All systems are negative except for pertinent findings as noted.  ? ?Review of Systems  ?Constitutional:  Positive for malaise/fatigue.  ?Respiratory:  Positive for shortness of breath.   ?Musculoskeletal:  Positive for joint pain.  ?Neurological:  Positive for weakness.  ?Endo/Heme/Allergies:  Bruises/bleeds easily.  ?Psychiatric/Behavioral:  The patient is nervous/anxious.   ? ?Allergies  ?Allergen Reactions  ? Penicillins Hives  ?  03/04/13: Pt has tolerated Amoxicillin/Unsyn without reaction  ? Contrast Media [Iodinated Contrast Media] Other (See Comments)  ?  Arm swelled up and was painful after IVP years ago/   ? Levaquin [Levofloxacin In D5w] Other (See Comments)  ?  Made her sick all over  ? ? ?Outpatient Encounter Medications as of 09/20/2021  ?Medication Sig Note  ? aspirin EC 81 MG tablet Take 81 mg by mouth daily. Swallow whole.   ? cholecalciferol (VITAMIN D)  1000 units tablet Take 1,000 Units by mouth daily.    ? levothyroxine (SYNTHROID, LEVOTHROID) 75 MCG tablet Take 75 mcg by mouth daily before breakfast.   ? meclizine (ANTIVERT) 25 MG tablet Take 25 mg by mouth 2 (two) times daily as needed for dizziness. 03/07/2021: Takes rarely  ? midodrine (PROAMATINE) 2.5 MG tablet Take 1 tablet (2.5 mg total) by mouth 2 (two) times daily with a meal.   ? vitamin E 400 UNIT capsule Take 400 Units by mouth daily.    ? Zinc 50 MG CAPS Take 1 capsule by mouth daily.   ? gabapentin (NEURONTIN) 100 MG capsule Take 100 mg by mouth in the morning and at bedtime. (Patient not taking: Reported on 07/11/2021)   ? omeprazole (PRILOSEC) 40 MG capsule Take 40 mg by mouth daily. (Patient not taking: Reported on  07/11/2021)   ? [DISCONTINUED] Vibegron (GEMTESA) 75 MG TABS Take 75 mg by mouth daily. (Patient not taking: Reported on 07/11/2021)   ? ?No facility-administered encounter medications on file as of 09/20/2021.  ? ? ?Past Medical History:  ?Diagnosis Date  ? Complication of anesthesia   ? Trouble waking up  ? GERD (gastroesophageal reflux disease)   ? History of kidney stones   ? Hypothyroidism   ? LBBB (left bundle branch block)   ? MVP (mitral valve prolapse)   ? Osteoporosis   ? Palpitations   ? RA (rheumatoid arthritis) ([place])   ? Right ureteral stone   ? Rosacea   ? ? ?Past Surgical History:  ?Procedure Laterality Date  ? BACK SURGERY    ? CATARACT EXTRACTION W/ INTRAOCULAR LENS  IMPLANT, BILATERAL  4 yrs ago  ? both eyes  ? CHOLECYSTECTOMY  2005  ? EXTRACORPOREAL SHOCK WAVE LITHOTRIPSY  09-24-10  ? and 1990  ? Femur fx    ? HIP ARTHROPLASTY Right 10/12/2012  ? Procedure: ARTHROPLASTY BIPOLAR HIP;  Surgeon: Gearlean Alf, MD;  Location: [facility];  Service: Orthopedics;  Laterality: Right;  ? KNEE ARTHROSCOPY  04/17/2011, right  ? Procedure: ARTHROSCOPY KNEE;  Surgeon: Gearlean Alf;  Location: [place];  Service: Orthopedics;  Laterality: Right;  WITH LATERAL MENISCAL DEBRIDEMENT  ? LUMBAR LAMINECTOMY/DECOMPRESSION MICRODISCECTOMY Right 12/28/2019  ? Procedure: Right Lumbar Four-Lumbar Five Microdiscectomy;  Surgeon: Erline Levine, MD;  Location: [place];  Service: Neurosurgery;  Laterality: Right;  Right Lumbar Four-Lumbar Five Microdiscectomy  ? MOLES EXCISED  2012  ? on nose  ? Sternum Fx    ? TOTAL KNEE ARTHROPLASTY  03/23/2012  ? Procedure: TOTAL KNEE ARTHROPLASTY;  Surgeon: Gearlean Alf, MD;  Location: [facility];  Service: Orthopedics;  Laterality: Right;  ? VAGINAL HYSTERECTOMY  1970  ? Leiomyomata  ? ? ?Social History  ? ?Socioeconomic History  ? Marital status: Widowed  ?  Spouse name: Not on file  ? Number of children: Not on file  ? Years of education: Not on file  ? Highest education  level: Not on file  ?Occupational History  ? Not on file  ?Tobacco Use  ? Smoking status: Never  ? Smokeless tobacco: Never  ?Vaping Use  ? Vaping Use: Never used  ?Substance and Sexual Activity  ? Alcohol use: No  ?  Alcohol/week: 0.0 standard drinks  ? Drug use: No  ? Sexual activity: Not Currently  ?  Birth control/protection: Post-menopausal, Surgical  ?  Comment: 1st intercourse 87 yo-2 partners  ?Other Topics Concern  ? Not on file  ?Social History  Narrative  ? Lives in [place]  ? Lives c 2nd husband   ? NOK-Gina Axson  ? ?Social Determinants of Health  ? ?Financial Resource Strain: Not on file  ?Food Insecurity: Not on file  ?Transportation Needs: Not on file  ?Physical Activity: Not on file  ?Stress: Not on file  ?Social Connections: Not on file  ?Intimate Partner Violence: Not on file  ? ? ?Family History  ?Problem Relation Age of Onset  ? Cancer Mother   ?     COLON  ? Heart disease Father   ?     STROKE  ? Stroke Father   ? Ovarian cancer Maternal Grandmother   ? Crohn's disease Daughter   ? ? ? ? ? ?Objective: ?Vitals:  ? 09/20/21 1452  ?BP: 128/71  ?Pulse: 69  ? ? ? ?Physical Exam ? ?Lab Results:  ?No results found for this or any previous visit (from the past 24 hour(s)). ?UA is clear.  ?  ?BMET ? ? ? ?No results found for this or any previous visit (from the past 24 hour(s)). ? ? ? ?Studies/Results: ? ?I have reviewed her CT from August.  ? ?Assessment & Plan: ?OAB wet.   She has failed or had side effects to all of our medication options.   I have recommended she consider PTNS.  ? ?Kidney stones.   The bilateral stones were stable over the last 5 years on CT from 8/22.  She has had no symptoms. ? ?No orders of the defined types were placed in this encounter. ? ?  ? ?Orders Placed This Encounter  ?Procedures  ? Urinalysis, Routine w reflex microscopic  ?  ? ? ?Return in about 6 months (around 03/22/2022) for She would like to consider PTNS if she can arrange transportation. . ? ? ?CC: Glenda Chroman, MD    ? ? ? ?Irine Seal ?09/20/2021 ?Patient ID: Asalee Barrette, female   DOB: [birth year], 88 y.o.   MRN: [id number] ?Patient ID: Cordella Nyquist, female   DOB: October 30, 1925, 88 y.o.   MRN: [id number] ? ?

## 2021-09-21 LAB — URINALYSIS, ROUTINE W REFLEX MICROSCOPIC
Bilirubin, UA: NEGATIVE
Glucose, UA: NEGATIVE
Ketones, UA: NEGATIVE
Leukocytes,UA: NEGATIVE
Nitrite, UA: NEGATIVE
Protein,UA: NEGATIVE
RBC, UA: NEGATIVE
Specific Gravity, UA: 1.02 (ref 1.005–1.030)
Urobilinogen, Ur: 1 mg/dL (ref 0.2–1.0)
pH, UA: 6 (ref 5.0–7.5)

## 2021-09-23 ENCOUNTER — Encounter: Payer: Self-pay | Admitting: Urology

## 2021-09-25 DIAGNOSIS — I951 Orthostatic hypotension: Secondary | ICD-10-CM | POA: Diagnosis not present

## 2021-09-25 DIAGNOSIS — Z299 Encounter for prophylactic measures, unspecified: Secondary | ICD-10-CM | POA: Diagnosis not present

## 2021-09-25 DIAGNOSIS — E538 Deficiency of other specified B group vitamins: Secondary | ICD-10-CM | POA: Diagnosis not present

## 2021-09-25 DIAGNOSIS — R5383 Other fatigue: Secondary | ICD-10-CM | POA: Diagnosis not present

## 2021-09-25 DIAGNOSIS — Z79899 Other long term (current) drug therapy: Secondary | ICD-10-CM | POA: Diagnosis not present

## 2021-09-25 DIAGNOSIS — Z789 Other specified health status: Secondary | ICD-10-CM | POA: Diagnosis not present

## 2021-09-26 ENCOUNTER — Telehealth: Payer: Self-pay | Admitting: *Deleted

## 2021-09-26 NOTE — Telephone Encounter (Signed)
Prol[employer] insurance verification has been sent awaiting Summary of benefits  

## 2021-10-08 NOTE — Telephone Encounter (Addendum)
Deductible $226 ($154.95)  OOP MAX n/a  Annual exam 10/12/2021  Calcium   9.6          Date 03/21/2021  Upcoming dental procedures NO  Prior Authorization needed NO  Pt estimated Cost $0      Coverage Details: 20% OF ONE DOSE, 20% ADMIN FEE This is a Medicare Supplement Plan G and it covers the Medicare Part B coinsurance and 100% of the excess charges

## 2021-10-12 ENCOUNTER — Ambulatory Visit (INDEPENDENT_AMBULATORY_CARE_PROVIDER_SITE_OTHER): Payer: Medicare Other | Admitting: Obstetrics & Gynecology

## 2021-10-12 ENCOUNTER — Encounter: Payer: Self-pay | Admitting: Obstetrics & Gynecology

## 2021-10-12 VITALS — BP 120/78 | HR 57 | Ht 63.75 in | Wt 135.0 lb

## 2021-10-12 DIAGNOSIS — N3946 Mixed incontinence: Secondary | ICD-10-CM | POA: Diagnosis not present

## 2021-10-12 DIAGNOSIS — M810 Age-related osteoporosis without current pathological fracture: Secondary | ICD-10-CM

## 2021-10-12 MED ORDER — DENOSUMAB 60 MG/ML ~~LOC~~ SOSY
60.0000 mg | PREFILLED_SYRINGE | Freq: Once | SUBCUTANEOUS | Status: AC
  Administered 2021-10-12: 60 mg via SUBCUTANEOUS

## 2021-10-12 NOTE — Progress Notes (Signed)
    Debra Richards 02/08/1926 [id number]   History:    88 y.o. G2P2L2  RP: Urinary incontinence and H/O Osteoporosis on Prolia  HPI: Urinary incontinence with Urgency and SUI.  H/O Osteoporosis, improved to Osteopenia on Prolia.  Physically active.  No recent fall.  No current fracture.  Past medical history,surgical history, family history and social history were all reviewed and documented in the EPIC chart.  Gynecologic History No LMP recorded. Patient has had a hysterectomy.  Obstetric History OB History  Gravida Para Term Preterm AB Living  '2 2 2     2  '$ SAB IAB Ectopic Multiple Live Births               # Outcome Date GA Lbr Len/2nd Weight Sex Delivery Anes PTL Lv  2 Term           1 Term              ROS: A ROS was performed and pertinent positives and negatives are included in the history. GENERAL: No fevers or chills. HEENT: No change in vision, no earache, sore throat or sinus congestion. NECK: No pain or stiffness. CARDIOVASCULAR: No chest pain or pressure. No palpitations. PULMONARY: No shortness of breath, cough or wheeze. GASTROINTESTINAL: No abdominal pain, nausea, vomiting or diarrhea, melena or bright red blood per rectum. GENITOURINARY: No urinary frequency, urgency, hesitancy or dysuria. MUSCULOSKELETAL: No joint or muscle pain, no back pain, no recent trauma. DERMATOLOGIC: No rash, no itching, no lesions. ENDOCRINE: No polyuria, polydipsia, no heat or cold intolerance. No recent change in weight. HEMATOLOGICAL: No anemia or easy bruising or bleeding. NEUROLOGIC: No headache, seizures, numbness, tingling or weakness. PSYCHIATRIC: No depression, no loss of interest in normal activity or change in sleep pattern.     Exam:   BP 120/78   Pulse (!) 57   Ht 5' 3.75" (1.619 m)   Wt 135 lb (61.2 kg)   BMI 23.35 kg/m   Body mass index is 23.35 kg/m.  General appearance : Well developed well nourished female. No acute distress HEENT: Eyes: no retinal hemorrhage  or exudates,  Neck supple, trachea midline, no carotid bruits, no thyroidmegaly Lungs: Clear to auscultation, no rhonchi or wheezes, or rib retractions  Heart: Regular rate and rhythm, no murmurs or gallops Breast:Examined in sitting and supine position were symmetrical in appearance, no palpable masses or tenderness,  no skin retraction, no nipple inversion, no nipple discharge, no skin discoloration, no axillary or supraclavicular lymphadenopathy Abdomen: no palpable masses or tenderness, no rebound or guarding Extremities: no edema or skin discoloration or tenderness  Pelvic: Vulva: Normal             Vagina: No gross lesions or discharge  Cervix/Uterus absent  Adnexa  Without masses or tenderness  Anus: Normal   Assessment/Plan:  88 y.o. female for annual exam   1. Postmenopausal osteoporosis H/O Osteoporosis improved on Prolia.  Bone Density [id number] showed Osteopenia with T-Score of -2.3 at the Left Total Femur.  Prolia injection today. PT, Vit D and Ca++.  2. Mixed stress and urge urinary incontinence Counseling done on Urinary Urgency and SUI.  The importance of avoiding overfilling of her bladder reviewed.  Kegels recommended.  Avoid pelvic floor pressure.  Refer to PT through Dr Jeffie Pollock Urologist.  Other orders - B Complex Vitamins (B-COMPLEX/B-12 PO); Take by mouth.   [name] MD, 11:05 AM 10/12/2021

## 2021-10-15 ENCOUNTER — Telehealth: Payer: Self-pay | Admitting: *Deleted

## 2021-10-15 NOTE — Telephone Encounter (Signed)
Referral faxed to [facility] for PT they will call patient to schedule.

## 2021-10-15 NOTE — Telephone Encounter (Signed)
-----   Message from [name], MD sent at 10/12/2021 11:08 AM EDT ----- Regarding: Refer to Physical Therapy for Pelvic Floor Reinforcement Mixed Urinary Incontinence followed by Uro Dr Jeffie Pollock.  Please refer to PT thru him. PT would be useful for balance and strength exercises as well given H/O Osteoporosis on Prolia.

## 2021-10-16 ENCOUNTER — Encounter: Payer: Self-pay | Admitting: Obstetrics & Gynecology

## 2021-10-18 NOTE — Telephone Encounter (Signed)
Prolia Given 10/12/2021 Next injection 04/15/2022

## 2021-10-26 NOTE — Telephone Encounter (Signed)
Urology reports they didn't receive fax, I received confirmation fax was received. Notes re-faxed.

## 2021-12-04 ENCOUNTER — Other Ambulatory Visit: Payer: Self-pay | Admitting: Cardiology

## 2021-12-12 DIAGNOSIS — M25551 Pain in right hip: Secondary | ICD-10-CM | POA: Diagnosis not present

## 2021-12-12 DIAGNOSIS — Z299 Encounter for prophylactic measures, unspecified: Secondary | ICD-10-CM | POA: Diagnosis not present

## 2021-12-12 DIAGNOSIS — Z789 Other specified health status: Secondary | ICD-10-CM | POA: Diagnosis not present

## 2021-12-12 DIAGNOSIS — N1831 Chronic kidney disease, stage 3a: Secondary | ICD-10-CM | POA: Diagnosis not present

## 2021-12-12 DIAGNOSIS — I700 Atherosclerosis of aorta: Secondary | ICD-10-CM | POA: Diagnosis not present

## 2021-12-12 NOTE — Telephone Encounter (Signed)
Urology unable to contact patient encounter closed.

## 2021-12-18 ENCOUNTER — Ambulatory Visit (INDEPENDENT_AMBULATORY_CARE_PROVIDER_SITE_OTHER): Payer: Medicare Other

## 2021-12-18 ENCOUNTER — Encounter: Payer: Self-pay | Admitting: Orthopaedic Surgery

## 2021-12-18 ENCOUNTER — Ambulatory Visit: Payer: Medicare Other | Admitting: Orthopaedic Surgery

## 2021-12-18 VITALS — BP 127/47 | HR 65 | Ht 64.0 in | Wt 135.0 lb

## 2021-12-18 DIAGNOSIS — M25551 Pain in right hip: Secondary | ICD-10-CM

## 2021-12-18 MED ORDER — PREDNISONE 5 MG (21) PO TBPK: ORAL_TABLET | ORAL | 0 refills | Status: DC

## 2021-12-18 NOTE — Progress Notes (Signed)
My right hip hurts.  She has had pain in the right hip and lower back area for the last three to four weeks.  Her pain stays in the right hip and lower back area with no radiation.  She has no falls.  She had back surgery about two years ago by Dr. Vertell Limber.  She has tried Tylenol and ice with no help.  She is post right hip surgery years ago.  She has no new trauma.  Right hip has decreased internal motion, external motion full, other motions full.  NV intact.  Spine/Pelvis examination:  Inspection:  Overall, sacoiliac joint benign and hips nontender; without crepitus or defects.   Thoracic spine inspection: Alignment normal without kyphosis present   Lumbar spine inspection:  Alignment  with normal lumbar lordosis, without scoliosis apparent.   Thoracic spine palpation:  without tenderness of spinal processes   Lumbar spine palpation: without tenderness of lumbar area; without tightness of lumbar muscles    Range of Motion:   Lumbar flexion, forward flexion is normal without pain or tenderness    Lumbar extension is full without pain or tenderness   Left lateral bend is normal without pain or tenderness   Right lateral bend is normal without pain or tenderness   Straight leg raising is normal  Strength & tone: normal   Stability overall normal stability  X-rays were done of the right hip, reported separately.  Encounter Diagnosis  Name Primary?   Pain in right hip Yes   I will give prednisone dose pack.  Return in two weeks.  Call if any problem.  Precautions discussed.  Electronically Signed Sanjuana Kava, MD 7/25/20233:50 PM

## 2022-01-01 ENCOUNTER — Ambulatory Visit: Payer: Medicare Other | Admitting: Orthopaedic Surgery

## 2022-01-14 NOTE — Progress Notes (Unsigned)
Cardiology Office Note  Date: 01/15/2022   ID: Debra Richards, DOB 07-Oct-1925, MRN [id number]  PCP:  Glenda Chroman, MD  Cardiologist:  Debra Lesches, MD Electrophysiologist:  None   Chief Complaint  Patient presents with   Cardiac follow-up    History of Present Illness: Debra Richards is a 88 y.o. female last seen in February.  She is here for a routine visit.  Reports no sudden dizziness or syncope, still living in her own home and functional with ADLs.  She has lunch with her son Monday through Friday and also with daughter nearby that checks on her.  She does not report any exertional chest pain, no increasing sense of palpitations.  We went over her medications and she has tolerated low-dose midodrine quite well.  Blood pressure today is normal.  Past Medical History:  Diagnosis Date   Arthritis    Complication of anesthesia    Trouble waking up   GERD (gastroesophageal reflux disease)    History of kidney stones    Hypothyroidism    LBBB (left bundle branch block)    MVP (mitral valve prolapse)    Osteoporosis    Palpitations    RA (rheumatoid arthritis) ([place])    Right ureteral stone    Rosacea     Past Surgical History:  Procedure Laterality Date   BACK SURGERY     CATARACT EXTRACTION W/ INTRAOCULAR LENS  IMPLANT, BILATERAL  4 yrs ago   both eyes   CHOLECYSTECTOMY  2005   EXTRACORPOREAL SHOCK WAVE LITHOTRIPSY  09-24-10   and 1990   Femur fx     HIP ARTHROPLASTY Right 10/12/2012   Procedure: ARTHROPLASTY BIPOLAR HIP;  Surgeon: Gearlean Alf, MD;  Location: [facility];  Service: Orthopedics;  Laterality: Right;   KNEE ARTHROSCOPY  04/17/2011, right   Procedure: ARTHROSCOPY KNEE;  Surgeon: Gearlean Alf;  Location: [place];  Service: Orthopedics;  Laterality: Right;  WITH LATERAL MENISCAL DEBRIDEMENT   LUMBAR LAMINECTOMY/DECOMPRESSION MICRODISCECTOMY Right 12/28/2019   Procedure: Right Lumbar Four-Lumbar Five Microdiscectomy;  Surgeon:  Erline Levine, MD;  Location: [place];  Service: Neurosurgery;  Laterality: Right;  Right Lumbar Four-Lumbar Five Microdiscectomy   MOLES EXCISED  2012   on nose   Sternum Fx     TOTAL KNEE ARTHROPLASTY  03/23/2012   Procedure: TOTAL KNEE ARTHROPLASTY;  Surgeon: Gearlean Alf, MD;  [facility];  Service: Orthopedics;  Laterality: Right;   VAGINAL HYSTERECTOMY  1970   Leiomyomata    Current Outpatient Medications  Medication Sig Dispense Refill   aspirin EC 81 MG tablet Take 81 mg by mouth daily. Swallow whole.     B Complex Vitamins (B-COMPLEX/B-12 PO) Take by mouth. occasionally     cholecalciferol (VITAMIN D) 1000 units tablet Take 1,000 Units by mouth daily.      levothyroxine (SYNTHROID, LEVOTHROID) 75 MCG tablet Take 75 mcg by mouth daily before breakfast.     meclizine (ANTIVERT) 25 MG tablet Take 25 mg by mouth 2 (two) times daily as needed for dizziness.     midodrine (PROAMATINE) 2.5 MG tablet TAKE 1 TABLET BY MOUTH TWICE DAILY WITH A MEAL. 60 tablet 6   Zinc 50 MG CAPS Take 1 capsule by mouth daily.     No current facility-administered medications for this visit.   Allergies:  Penicillins, Contrast media [iodinated contrast media], and Levaquin [levofloxacin in d5w]   ROS: Arthritic pains.  Physical Exam: VS:  BP 112/80  Pulse 63   Ht '5\' 4"'$  (1.626 m)   Wt 139 lb 9.6 oz (63.3 kg)   SpO2 100%   BMI 23.96 kg/m , BMI Body mass index is 23.96 kg/m.  Wt Readings from Last 3 Encounters:  01/15/22 139 lb 9.6 oz (63.3 kg)  12/18/21 135 lb (61.2 kg)  10/12/21 135 lb (61.2 kg)    General: Patient appears comfortable at rest. HEENT: Conjunctiva and lids normal. Neck: Supple, no elevated JVP or carotid bruits, no thyromegaly. Lungs: Clear to auscultation, nonlabored breathing at rest. Cardiac: Regular rate and rhythm, no S3, 1/6 systolic murmur. Extremities: No pitting edema.  ECG:  An ECG dated 03/07/2021 was personally reviewed today and demonstrated:  Sinus  rhythm with prolonged PR interval and left bundle branch block.  Recent Labwork: 03/07/2021: BUN 20; Creatinine, Ser 0.80; Hemoglobin 12.3; Platelets 155; Potassium 3.8; Sodium 139   Other Studies Reviewed Today:  Lexiscan Myoview 04/02/2019: Left bundle branch block present throughout with rare PACs and PVCs. Small, mild intensity, mid to basal inferoseptal defect that is partially reversible, suggestive of variable soft tissue attenuation versus mild ischemic territory. Increased TID ratio looks to be due to misregistration rather than actual dilatation. This is a low risk study. Nuclear stress EF: 73%.   Echocardiogram 10/30/2020:  1. Left ventricular ejection fraction, by estimation, is 60 to 65%. The  left ventricle has normal function. The left ventricle has no regional  wall motion abnormalities. Left ventricular diastolic parameters are  indeterminate.   2. Right ventricular systolic function is normal. The right ventricular  size is normal. There is normal pulmonary artery systolic pressure.   3. The mitral valve is normal in structure. No evidence of mitral valve  regurgitation.   4. The aortic valve is normal in structure. Aortic valve regurgitation is  not visualized.   5. The inferior vena cava is normal in size with greater than 50%  respiratory variability, suggesting right atrial pressure of 3 mmHg.    Cardiac monitor November 2022: ZIO AT reviewed.  21 hours analyzed after removal of artifact.  Predominant rhythm is sinus with IVCD.  Heart rate ranged from 56 bpm up to 84 bpm and average heart rate 63 bpm.  There were rare PACs including triplets representing less than 1% total beats.  There were rare PVCs representing less than 1% total beats.  No significant arrhythmias or pauses.  Assessment and Plan:  1.  Orthostatic hypotension, doing well on midodrine.  Blood pressure today is normal.  She has had no sudden dizziness or recent falls.  2.  History of palpitations  with atrial and ventricular ectopy documented by prior monitoring.  Continue to follow, currently not on AV nodal blockers and with no escalation in symptoms.  Medication Adjustments/Labs and Tests Ordered: Current medicines are reviewed at length with the patient today.  Concerns regarding medicines are outlined above.   Tests Ordered: No orders of the defined types were placed in this encounter.   Medication Changes: No orders of the defined types were placed in this encounter.   Disposition:  Follow up  6 months.  Signed, Satira Sark, MD, [facility] 01/15/2022 11:35 AM    [facility] [place], [place], [place] [address] Phone: [phone]; Fax: [phone]

## 2022-01-15 ENCOUNTER — Ambulatory Visit: Payer: Medicare Other | Admitting: Cardiology

## 2022-01-15 ENCOUNTER — Encounter: Payer: Self-pay | Admitting: Cardiology

## 2022-01-15 VITALS — BP 112/80 | HR 63 | Ht 64.0 in | Wt 139.6 lb

## 2022-01-15 DIAGNOSIS — I951 Orthostatic hypotension: Secondary | ICD-10-CM | POA: Diagnosis not present

## 2022-01-15 DIAGNOSIS — R002 Palpitations: Secondary | ICD-10-CM

## 2022-01-15 NOTE — Patient Instructions (Signed)

## 2022-02-05 DIAGNOSIS — Z6822 Body mass index (BMI) 22.0-22.9, adult: Secondary | ICD-10-CM | POA: Diagnosis not present

## 2022-02-05 DIAGNOSIS — D692 Other nonthrombocytopenic purpura: Secondary | ICD-10-CM | POA: Diagnosis not present

## 2022-02-05 DIAGNOSIS — Z299 Encounter for prophylactic measures, unspecified: Secondary | ICD-10-CM | POA: Diagnosis not present

## 2022-02-05 DIAGNOSIS — Z0000 Encounter for general adult medical examination without abnormal findings: Secondary | ICD-10-CM | POA: Diagnosis not present

## 2022-02-05 DIAGNOSIS — I700 Atherosclerosis of aorta: Secondary | ICD-10-CM | POA: Diagnosis not present

## 2022-02-05 DIAGNOSIS — Z7189 Other specified counseling: Secondary | ICD-10-CM | POA: Diagnosis not present

## 2022-02-05 DIAGNOSIS — N183 Chronic kidney disease, stage 3 unspecified: Secondary | ICD-10-CM | POA: Diagnosis not present

## 2022-02-20 DIAGNOSIS — M4319 Spondylolisthesis, multiple sites in spine: Secondary | ICD-10-CM | POA: Diagnosis not present

## 2022-02-21 ENCOUNTER — Other Ambulatory Visit (HOSPITAL_COMMUNITY): Payer: Self-pay | Admitting: Neurological Surgery

## 2022-02-21 ENCOUNTER — Other Ambulatory Visit: Payer: Self-pay | Admitting: Neurological Surgery

## 2022-02-21 DIAGNOSIS — M4319 Spondylolisthesis, multiple sites in spine: Secondary | ICD-10-CM

## 2022-03-19 ENCOUNTER — Ambulatory Visit (HOSPITAL_COMMUNITY)
Admission: RE | Admit: 2022-03-19 | Discharge: 2022-03-19 | Disposition: A | Discharge status: Home / Self Care | Payer: Medicare Other | Source: Ambulatory Visit | Attending: Neurological Surgery | Admitting: Neurological Surgery

## 2022-03-19 ENCOUNTER — Other Ambulatory Visit (HOSPITAL_COMMUNITY): Payer: Self-pay | Admitting: Neurological Surgery

## 2022-03-19 DIAGNOSIS — M48061 Spinal stenosis, lumbar region without neurogenic claudication: Secondary | ICD-10-CM | POA: Diagnosis not present

## 2022-03-19 DIAGNOSIS — M4319 Spondylolisthesis, multiple sites in spine: Secondary | ICD-10-CM

## 2022-03-19 DIAGNOSIS — M545 Low back pain, unspecified: Secondary | ICD-10-CM | POA: Diagnosis not present

## 2022-03-21 ENCOUNTER — Encounter: Payer: Self-pay | Admitting: Urology

## 2022-03-21 ENCOUNTER — Ambulatory Visit: Payer: Medicare Other | Admitting: Urology

## 2022-03-21 VITALS — BP 108/68 | HR 73

## 2022-03-21 DIAGNOSIS — N200 Calculus of kidney: Secondary | ICD-10-CM

## 2022-03-21 DIAGNOSIS — Z87442 Personal history of urinary calculi: Secondary | ICD-10-CM | POA: Diagnosis not present

## 2022-03-21 DIAGNOSIS — R3915 Urgency of urination: Secondary | ICD-10-CM

## 2022-03-21 DIAGNOSIS — N3941 Urge incontinence: Secondary | ICD-10-CM

## 2022-03-21 DIAGNOSIS — R351 Nocturia: Secondary | ICD-10-CM | POA: Diagnosis not present

## 2022-03-21 NOTE — Progress Notes (Signed)
Subjective:  1. Urge incontinence   2. Urgency of urination   3. Nocturia   4. Renal stones      HPI: Debra Richards is a 88 year-old female established patient who is here for follow up of incontinence and stones.   03/21/22: Debra Richards returns today in f/u.   She continues to have daytime frequency and nocturia and she leaks at night.  She has some dysuria.  She has had no hematuria or dysuria.     09/20/21: Debra Richards returns today in f/u for her history of incontinence and prior stones.  Her UA is clear.  She has frequency and nocturia q1-2 hrs.  She continues to wear pads. She was given Logan Bores and had some bloating so she stopped that.  She has had no hematuria or flank pain..   04/26/21: Debra Richards returns today today in f/u.  She was taken off of the Myrbetriq because of issues with orthostasis requiring hospitalization.   She has nocturia q1-2 hr and frequency.  She wears depends at night at pads during the day.  She has a history of stones but no flank pain or hematuria.  A CT in 8/22 showed stable bilateral renal stones with no change since 2017.    10/26/20: Debra Richards was given Myrbetriq '50mg'$  at her last visit and she is doing well with reduced incontinence.  She has cut it to '25mg'$  because of expense.  She held it and her symptoms recurred.   She didn't get the KUB that was ordered for prior to this visit.   She had a CT Chest in 4/22 that showed bilateral renal stones without obstruction.   A Lumbar MRI in 1/22 also showed the stone.  She has had no hematuria.  She has some right sided pain but she has a history of lumbar spine disease and had surgery in 8/21.    GU Hx: Debra Richards returns today with the complaint of progressive incontinence over the last 3 years but it has been present for a decade. She has tried [place] and [employer] without success. She remains Myrbetriq '50mg'$  which helps but she still has nocturia x 4-5x.  She has urgency with UUI.  She has had no flank pain or hematuria and her UA  is ok today.    She has had PTNS and Botox discussed but hasn't done either. She has marked frequency and urgency and will have UUI. She doesn't have SUI. She has nocturia with enuresis. She has no dysuria or hematuria. She has a history of stones with occasional mild right flank pain. She last passed a stone 4+ years ago. She has DDD with chronic back pain and is recovering from recent surgery by Dr. Vertell Limber. Her UA is ok.        ROS:  ROS:  A complete review of systems was performed.  All systems are negative except for pertinent findings as noted.   Review of Systems  Constitutional:  Positive for malaise/fatigue.  Respiratory:  Positive for shortness of breath.   Musculoskeletal:  Positive for joint pain.  Neurological:  Positive for weakness.  Endo/Heme/Allergies:  Bruises/bleeds easily.  Psychiatric/Behavioral:  The patient is nervous/anxious.     Allergies  Allergen Reactions   Penicillins Hives    03/04/13: Pt has tolerated Amoxicillin/Unsyn without reaction   Contrast Media [Iodinated Contrast Media] Other (See Comments)    Arm swelled up and was painful after IVP years ago/    Levaquin [Levofloxacin In D5w] Other (See Comments)  Made her sick all over    Outpatient Encounter Medications as of 03/21/2022  Medication Sig Note   aspirin EC 81 MG tablet Take 81 mg by mouth daily. Swallow whole.    B Complex Vitamins (B-COMPLEX/B-12 PO) Take by mouth. occasionally    cholecalciferol (VITAMIN D) 1000 units tablet Take 1,000 Units by mouth daily.     levothyroxine (SYNTHROID, LEVOTHROID) 75 MCG tablet Take 75 mcg by mouth daily before breakfast.    meclizine (ANTIVERT) 25 MG tablet Take 25 mg by mouth 2 (two) times daily as needed for dizziness. 03/07/2021: Takes rarely   midodrine (PROAMATINE) 2.5 MG tablet TAKE 1 TABLET BY MOUTH TWICE DAILY WITH A MEAL.    Zinc 50 MG CAPS Take 1 capsule by mouth daily.    No facility-administered encounter medications on file as of  03/21/2022.    Past Medical History:  Diagnosis Date   Arthritis    Complication of anesthesia    Trouble waking up   GERD (gastroesophageal reflux disease)    History of kidney stones    Hypothyroidism    LBBB (left bundle branch block)    MVP (mitral valve prolapse)    Osteoporosis    Palpitations    RA (rheumatoid arthritis) ([place])    Right ureteral stone    Rosacea     Past Surgical History:  Procedure Laterality Date   BACK SURGERY     CATARACT EXTRACTION W/ INTRAOCULAR LENS  IMPLANT, BILATERAL  4 yrs ago   both eyes   CHOLECYSTECTOMY  2005   EXTRACORPOREAL SHOCK WAVE LITHOTRIPSY  09-24-10   and 1990   Femur fx     HIP ARTHROPLASTY Right 10/12/2012   Procedure: ARTHROPLASTY BIPOLAR HIP;  Surgeon: Gearlean Alf, MD;  Location: [facility];  Service: Orthopedics;  Laterality: Right;   KNEE ARTHROSCOPY  04/17/2011, right   Procedure: ARTHROSCOPY KNEE;  Surgeon: Gearlean Alf;  Location: [place];  Service: Orthopedics;  Laterality: Right;  WITH LATERAL MENISCAL DEBRIDEMENT   LUMBAR LAMINECTOMY/DECOMPRESSION MICRODISCECTOMY Right 12/28/2019   Procedure: Right Lumbar Four-Lumbar Five Microdiscectomy;  Surgeon: Erline Levine, MD;  Location: [place];  Service: Neurosurgery;  Laterality: Right;  Right Lumbar Four-Lumbar Five Microdiscectomy   MOLES EXCISED  2012   on nose   Sternum Fx     TOTAL KNEE ARTHROPLASTY  03/23/2012   Procedure: TOTAL KNEE ARTHROPLASTY;  Surgeon: Gearlean Alf, MD;  Location: [facility];  Service: Orthopedics;  Laterality: Right;   VAGINAL HYSTERECTOMY  1970   Leiomyomata    Social History   Socioeconomic History   Marital status: Widowed    Spouse name: Not on file   Number of children: Not on file   Years of education: Not on file   Highest education level: Not on file  Occupational History   Not on file  Tobacco Use   Smoking status: Never   Smokeless tobacco: Never  Vaping Use   Vaping Use: Never used  Substance and Sexual  Activity   Alcohol use: No    Alcohol/week: 0.0 standard drinks of alcohol   Drug use: No   Sexual activity: Not Currently    Birth control/protection: Post-menopausal, Surgical    Comment: 1st intercourse 75 yo-2 partners, hysterectomy  Other Topics Concern   Not on file  Social History Narrative   Lives in [place].   Lives c 2nd husband    NOK-Gina Axson   Social Determinants of Health   Financial Resource Strain:  Not on file  Food Insecurity: Not on file  Transportation Needs: Not on file  Physical Activity: Not on file  Stress: Not on file  Social Connections: Not on file  Intimate Partner Violence: Not on file    Family History  Problem Relation Age of Onset   Cancer Mother        COLON   Heart disease Father        STROKE   Stroke Father    Ovarian cancer Maternal Grandmother    Crohn's disease Daughter        Objective: Vitals:   03/21/22 1501  BP: 108/68  Pulse: 73     Physical Exam  Lab Results:  Results for orders placed or performed in visit on 03/21/22 (from the past 24 hour(s))  Urinalysis, Routine w reflex microscopic     Status: None   Collection Time: 03/21/22  3:07 PM  Result Value Ref Range   Specific Gravity, UA 1.020 1.005 - 1.030   pH, UA 5.5 5.0 - 7.5   Color, UA Yellow Yellow   Appearance Ur Clear Clear   Leukocytes,UA Negative Negative   Protein,UA Negative Negative/Trace   Glucose, UA Negative Negative   Ketones, UA Negative Negative   RBC, UA Negative Negative   Bilirubin, UA Negative Negative   Urobilinogen, Ur 0.2 0.2 - 1.0 mg/dL   Nitrite, UA Negative Negative   Microscopic Examination Comment    Narrative   Performed at:  [facility] [address], [place], [place]  [id number] Lab Director: Mina Marble MT, Phone:  [phone]   UA is clear.    BMET    Results for orders placed or performed in visit on 03/21/22 (from the past 24 hour(s))  Urinalysis, Routine w reflex microscopic     Status: None    Collection Time: 03/21/22  3:07 PM  Result Value Ref Range   Specific Gravity, UA 1.020 1.005 - 1.030   pH, UA 5.5 5.0 - 7.5   Color, UA Yellow Yellow   Appearance Ur Clear Clear   Leukocytes,UA Negative Negative   Protein,UA Negative Negative/Trace   Glucose, UA Negative Negative   Ketones, UA Negative Negative   RBC, UA Negative Negative   Bilirubin, UA Negative Negative   Urobilinogen, Ur 0.2 0.2 - 1.0 mg/dL   Nitrite, UA Negative Negative   Microscopic Examination Comment    Narrative   Performed at:  [facility] [address] [place], [place]  [id number] Lab Director: Mina Marble MT, Phone:  [phone]      Studies/Results:  MR LUMBAR SPINE WO CONTRAST  Result Date: 03/20/2022 CLINICAL DATA:  Low back pain EXAM: MRI LUMBAR SPINE WITHOUT CONTRAST TECHNIQUE: Multiplanar, multisequence MR imaging of the lumbar spine was performed. No intravenous contrast was administered. The patient declined administration of intravenous contrast agent. COMPARISON:  05/30/2020 FINDINGS: Segmentation:  Standard. Alignment:  Grade 1 retrolisthesis at L4-5 Vertebrae: Superior endplate Schmorl's node at L2. No acute abnormality. Conus medullaris and cauda equina: Conus extends to the L1 level. Conus and cauda equina appear normal. Paraspinal and other soft tissues: Fatty atrophy of the paraspinous muscles Disc levels: T11-12 and T12-L1 are imaged only in the sagittal plane and are normal. L1-L2: Unchanged small disc bulge. No spinal canal stenosis. No neural foraminal stenosis. L2-L3: Unchanged small disc bulge with mild facet hypertrophy. No spinal canal stenosis. No neural foraminal stenosis. L3-L4: Unchanged small disc bulge with mild facet hypertrophy and ligamentum flavum redundancy.  Unchanged mild spinal canal stenosis. No neural foraminal stenosis. L4-L5: Right L4 laminotomy. Unchanged small disc bulge and mild facet hypertrophy. There is right-greater-than-left lateral recess  stenosis possible impingement of the right L5 nerve root. Mild central spinal canal stenosis. No neural foraminal stenosis. L5-S1: Unchanged small disc bulge with endplate spurring. Right lateral recess narrowing without central spinal canal stenosis. Unchanged mild bilateral neural foraminal stenosis. Visualized sacrum: Normal. IMPRESSION: 1. Unchanged examination with mild spinal canal stenosis at L3-4 and L4-5. 2. Right lateral recess stenosis at L4-5 with possible impingement of the right L5 nerve root, unchanged. 3. Unchanged mild bilateral L5-S1 neural foraminal stenosis. Electronically Signed   By: Ulyses Jarred M.D.   On: 03/20/2022 12:58     Assessment & Plan: OAB wet.   She has failed or had side effects to all of our medication options.   I have recommended she consider PTNS.   Kidney stones.   The bilateral stones were stable over the last 5 years on CT from 8/22. She had a lumbar MRI on 03/18/22 and the kidneys were well visualized and there are renal cysts but no obvious stones.    No orders of the defined types were placed in this encounter.     Orders Placed This Encounter  Procedures   PTNS-Percutaneous Tibial Nerve Stimulati    [place] and not [place].    Standing Status:   Future    Standing Expiration Date:   03/22/2023    Scheduling Instructions:     Needs to be done in [place]. Please schedule weekly x 12 weeks.    Order Specific Question:   Porcedure Location    Answer:   [facility]   Urinalysis, Routine w reflex microscopic      Return in about 3 months (around 06/21/2022) for Please start her on PTNS weekly for 12 weeks and then f/u with me in 3 months. .   CC: Glenda Chroman, MD      Irine Seal 03/22/2022 Patient ID: Debra Richards, female   DOB: 1925/07/06, 88 y.o.   MRN: [id number] Patient ID: Debra Richards, female   DOB: 1925-10-29, 88 y.o.   MRN: [id number]

## 2022-03-22 LAB — URINALYSIS, ROUTINE W REFLEX MICROSCOPIC
Bilirubin, UA: NEGATIVE
Glucose, UA: NEGATIVE
Ketones, UA: NEGATIVE
Leukocytes,UA: NEGATIVE
Nitrite, UA: NEGATIVE
Protein,UA: NEGATIVE
RBC, UA: NEGATIVE
Specific Gravity, UA: 1.02 (ref 1.005–1.030)
Urobilinogen, Ur: 0.2 mg/dL (ref 0.2–1.0)
pH, UA: 5.5 (ref 5.0–7.5)

## 2022-03-25 ENCOUNTER — Ambulatory Visit: Payer: Medicare Other

## 2022-03-27 DIAGNOSIS — M5416 Radiculopathy, lumbar region: Secondary | ICD-10-CM | POA: Diagnosis not present

## 2022-04-01 ENCOUNTER — Ambulatory Visit: Payer: Medicare Other

## 2022-04-02 IMAGING — CT CT ABD-PELV W/O CM
2 of 4 series · 16 of 46 positions shown, 18 images · non-contrast
Comparison: 12/13/2020

CLINICAL DATA: Abdominal pain. Acute. Nonlocalized. Vomiting.
Symptoms began 3 days ago.

EXAM:
CT ABDOMEN AND PELVIS WITHOUT CONTRAST
TECHNIQUE: Multidetector CT imaging of the abdomen and pelvis was performed
following the standard protocol without IV contrast.

[Series 2: axial st · axial · 0.74mm/px · z∈[-731,-381]mm · 13 of 80 slices shown, 15 images]
[im 5/80  soft-tissue]
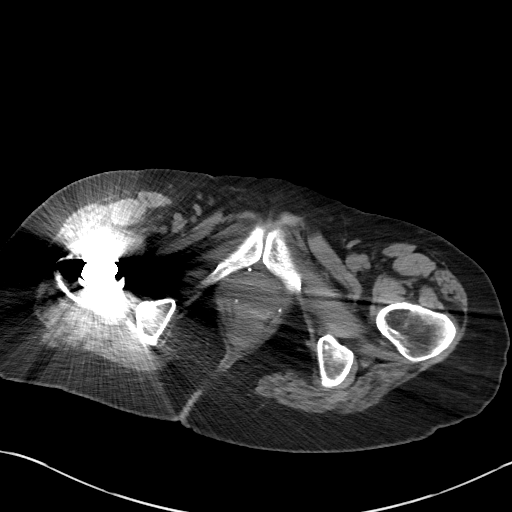
[im 5/80  bone]
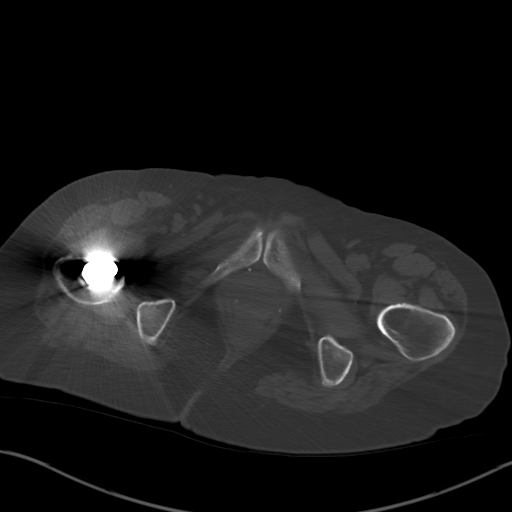
[im 10/80  soft-tissue]
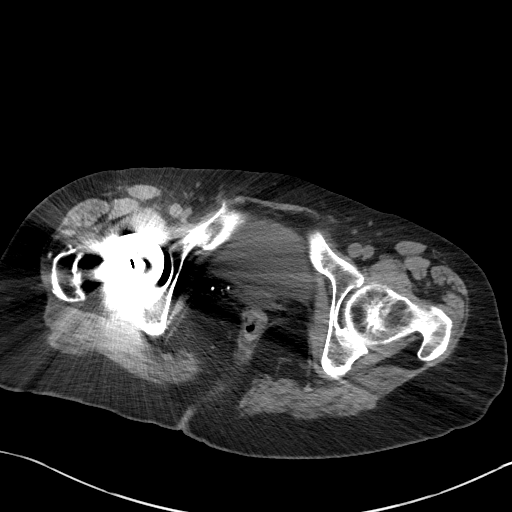
[im 19/80  soft-tissue]
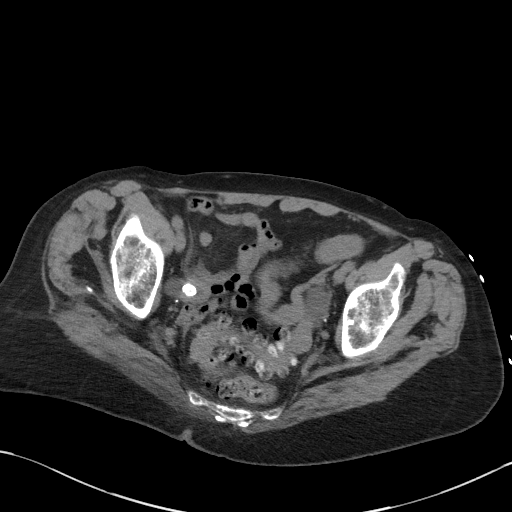
[im 24/80  soft-tissue]
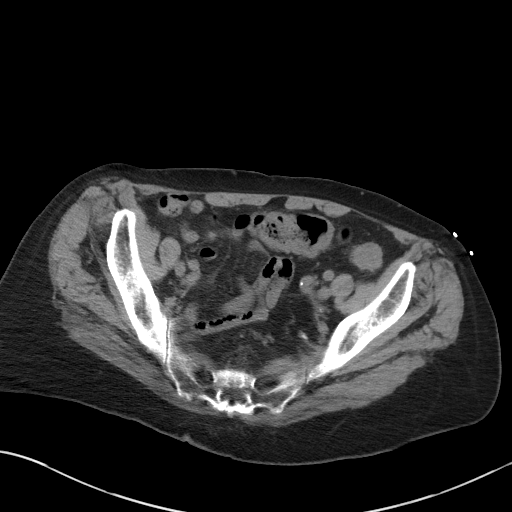
[im 28/80  soft-tissue]
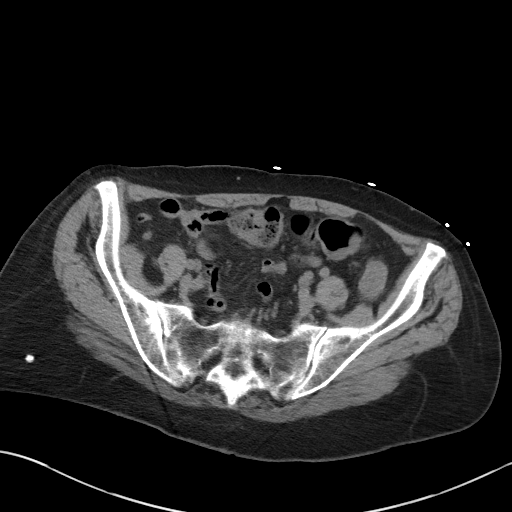
[im 33/80  soft-tissue]
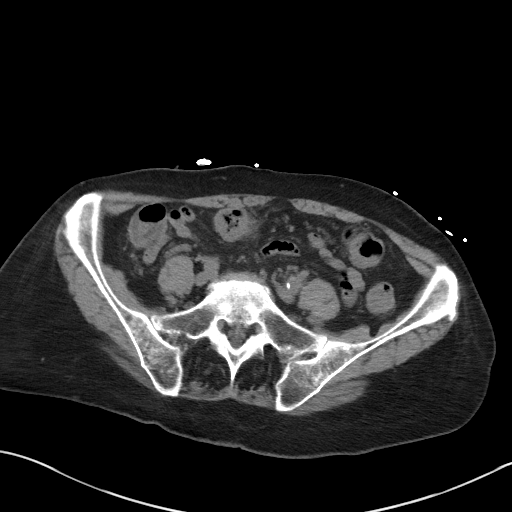
[im 42/80  soft-tissue]
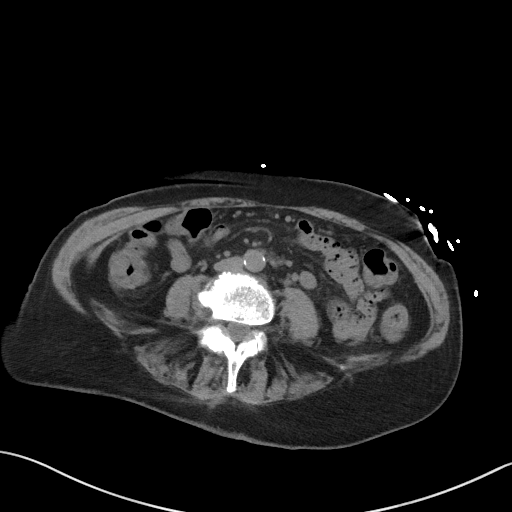
[im 47/80  soft-tissue]
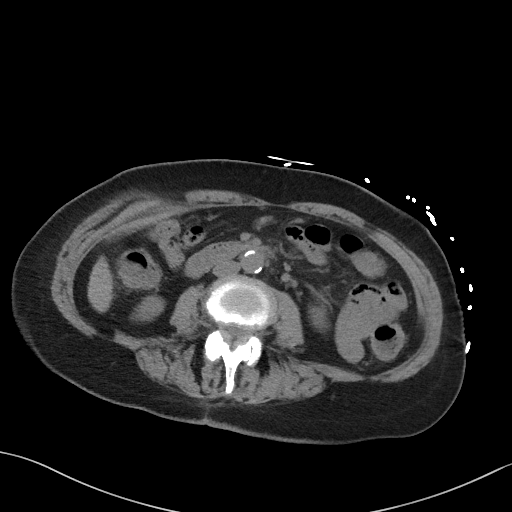
[im 52/80  soft-tissue]
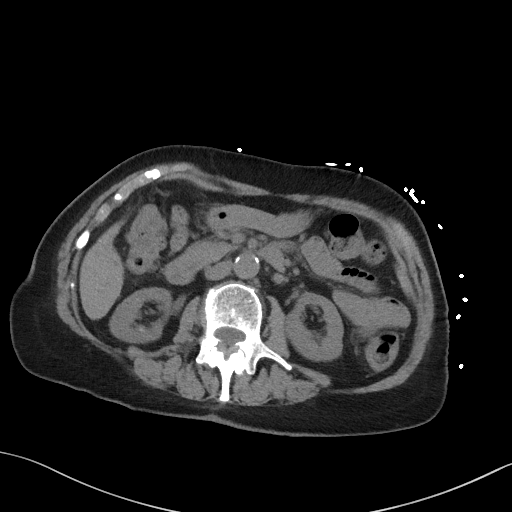
[im 52/80  bone]
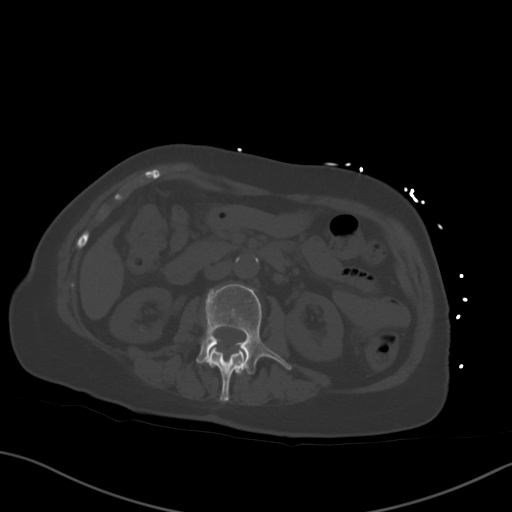
[im 56/80  soft-tissue]
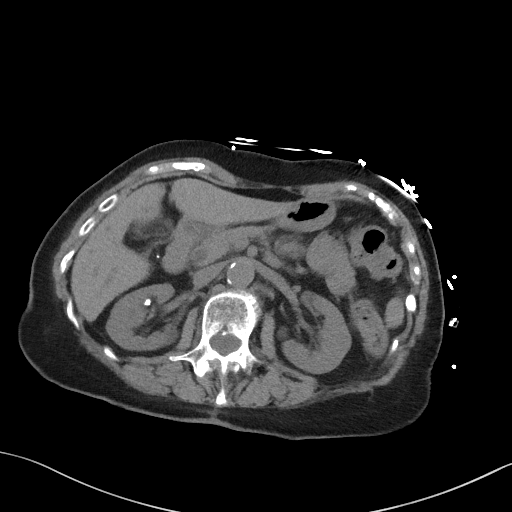
[im 61/80  soft-tissue]
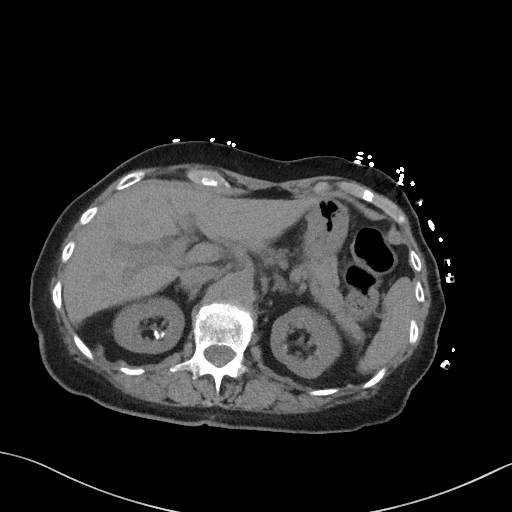
[im 70/80  soft-tissue]
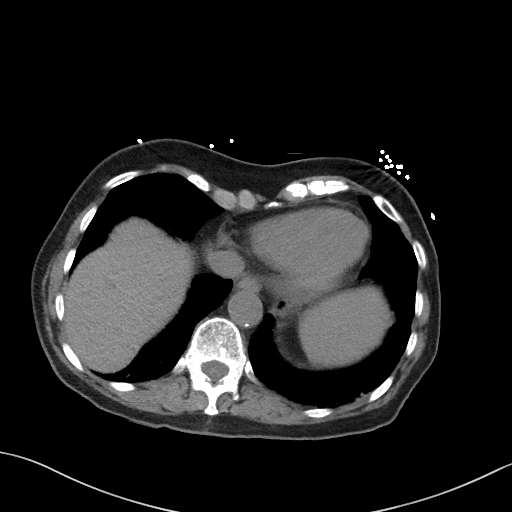
[im 75/80  soft-tissue]
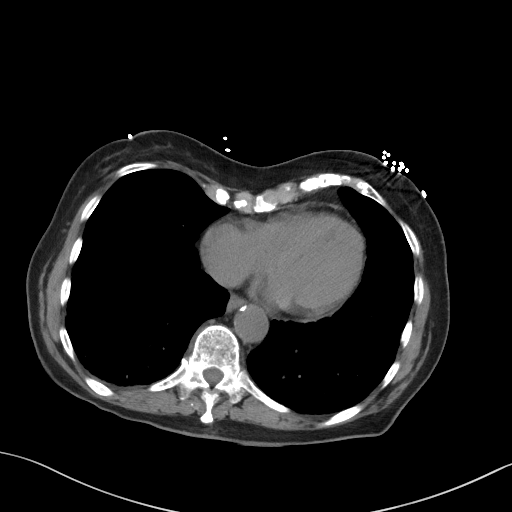

[Series 5: coronal st · coronal · 0.70mm/px · 3 of 83 slices shown]
[im 28/83  soft-tissue]
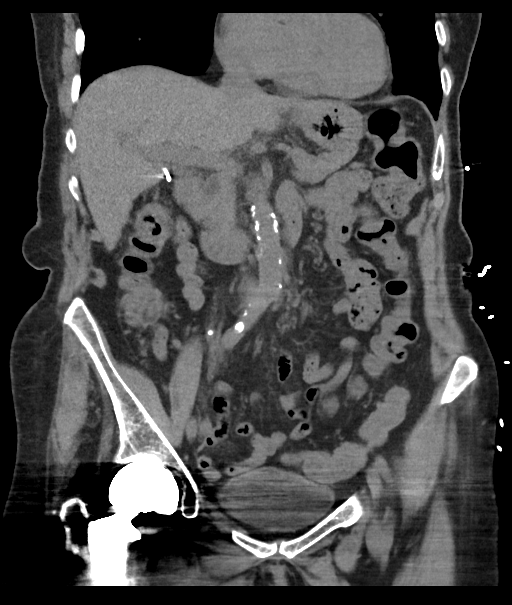
[im 37/83  soft-tissue]
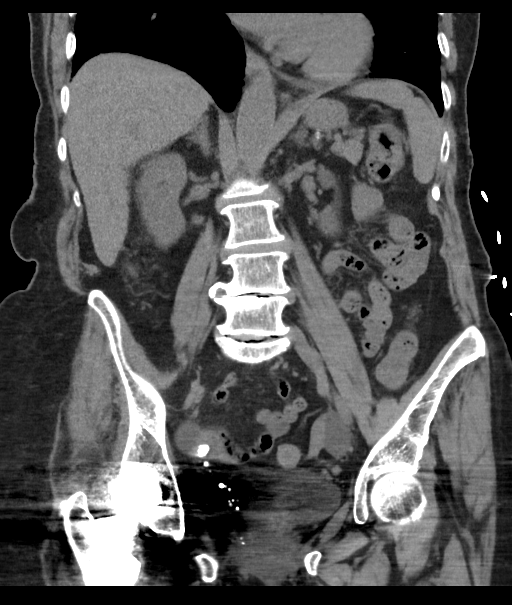
[im 46/83  soft-tissue]
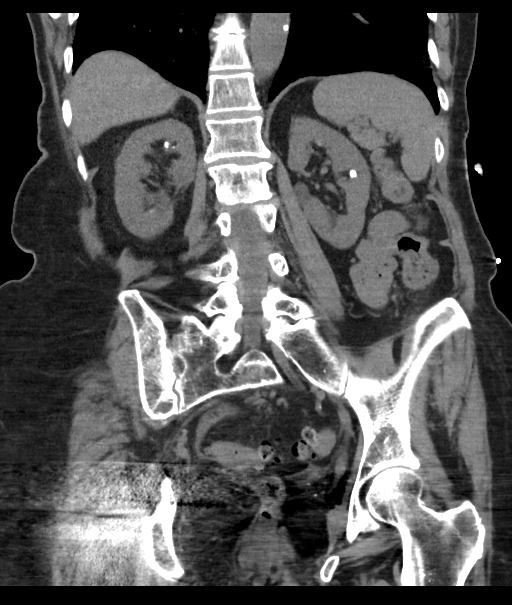

[16 of 46 positions shown; findings below may reference images not displayed]

FINDINGS: Lower chest: Newly seen patchy density at the lung bases that could
be atelectasis or minimal basilar pneumonia. No lobar consolidation
or collapse. No effusion.

Hepatobiliary: Previous cholecystectomy. Liver parenchyma appears
normal.

Pancreas: Normal

Spleen: Normal

Adrenals/Urinary Tract: Adrenal glands are normal. Multiple
bilateral renal calculi as seen previously. No
hydroureteronephrosis. No passing stone. No stone in the bladder.
There is streak artifact related to right hip replacement.

Stomach/Bowel: Stomach and small intestine are normal. There is
diverticulosis of the colon. No sign of diverticulitis. No sign of
obstruction or mass.

Vascular/Lymphatic: Aortic atherosclerosis. No aneurysm. IVC is
normal. No retroperitoneal adenopathy.

Reproductive: Previous hysterectomy.  No pelvic mass.

Other: No free fluid or air.

Musculoskeletal: Curvature and chronic degenerative changes of the
spine. Old minor superior endplate depression at L2. Previous hip
replacement on the right.
IMPRESSION: New minimal patchy density at the lung bases could be atelectasis or
minimal basilar pneumonia.

Nephrolithiasis without evidence of hydronephrosis or passing stone.

Diverticulosis without evidence of active diverticulitis. No bowel
obstruction or other acute bowel finding.

Aortic Atherosclerosis ([id number]).

## 2022-04-09 ENCOUNTER — Ambulatory Visit: Payer: Medicare Other

## 2022-04-15 ENCOUNTER — Ambulatory Visit: Payer: Medicare Other

## 2022-04-15 DIAGNOSIS — R0981 Nasal congestion: Secondary | ICD-10-CM | POA: Diagnosis not present

## 2022-04-15 DIAGNOSIS — J069 Acute upper respiratory infection, unspecified: Secondary | ICD-10-CM | POA: Diagnosis not present

## 2022-04-15 DIAGNOSIS — Z299 Encounter for prophylactic measures, unspecified: Secondary | ICD-10-CM | POA: Diagnosis not present

## 2022-05-07 ENCOUNTER — Telehealth: Payer: Self-pay | Admitting: *Deleted

## 2022-05-07 NOTE — Telephone Encounter (Signed)
Prol[employer] insurance verification has been sent awaiting Summary of benefits  

## 2022-05-14 NOTE — Telephone Encounter (Addendum)
Dual coverage   OV 10/12/2021  Calcium    9.8         Date 09/20/2021 KPN  Upcoming dental procedures no  Prior Authorization not required  Pt estimated Cost will file 1st  Appt 05/22/2022

## 2022-05-22 ENCOUNTER — Ambulatory Visit (INDEPENDENT_AMBULATORY_CARE_PROVIDER_SITE_OTHER): Payer: Medicare Other

## 2022-05-22 DIAGNOSIS — M810 Age-related osteoporosis without current pathological fracture: Secondary | ICD-10-CM

## 2022-05-22 MED ORDER — DENOSUMAB 60 MG/ML ~~LOC~~ SOSY
60.0000 mg | PREFILLED_SYRINGE | Freq: Once | SUBCUTANEOUS | Status: AC
  Administered 2022-05-22: 60 mg via SUBCUTANEOUS

## 2022-05-23 ENCOUNTER — Telehealth: Payer: Self-pay

## 2022-05-23 NOTE — Telephone Encounter (Signed)
yes

## 2022-05-23 NOTE — Telephone Encounter (Signed)
Pt calling to inquire if OK to receive flu vaccine today after receiving prolia injection yesterday (05/22/22). Please advise. Thanks.

## 2022-05-23 NOTE — Telephone Encounter (Signed)
No answer on both numbers provided in record, also unable to leave VM. Will try again shortly.

## 2022-05-29 NOTE — Telephone Encounter (Signed)
No returned call, will close encounter.

## 2022-06-05 DIAGNOSIS — I440 Atrioventricular block, first degree: Secondary | ICD-10-CM | POA: Diagnosis not present

## 2022-06-05 DIAGNOSIS — R1111 Vomiting without nausea: Secondary | ICD-10-CM | POA: Diagnosis not present

## 2022-06-05 DIAGNOSIS — W182XXA Fall in (into) shower or empty bathtub, initial encounter: Secondary | ICD-10-CM | POA: Diagnosis not present

## 2022-06-05 DIAGNOSIS — R9431 Abnormal electrocardiogram [ECG] [EKG]: Secondary | ICD-10-CM | POA: Diagnosis not present

## 2022-06-05 DIAGNOSIS — S0990XA Unspecified injury of head, initial encounter: Secondary | ICD-10-CM | POA: Diagnosis not present

## 2022-06-05 DIAGNOSIS — E860 Dehydration: Secondary | ICD-10-CM | POA: Diagnosis not present

## 2022-06-05 DIAGNOSIS — R001 Bradycardia, unspecified: Secondary | ICD-10-CM | POA: Diagnosis not present

## 2022-06-05 DIAGNOSIS — R110 Nausea: Secondary | ICD-10-CM | POA: Diagnosis not present

## 2022-06-05 DIAGNOSIS — W19XXXA Unspecified fall, initial encounter: Secondary | ICD-10-CM | POA: Diagnosis not present

## 2022-06-05 DIAGNOSIS — I700 Atherosclerosis of aorta: Secondary | ICD-10-CM | POA: Diagnosis not present

## 2022-06-05 DIAGNOSIS — K5730 Diverticulosis of large intestine without perforation or abscess without bleeding: Secondary | ICD-10-CM | POA: Diagnosis not present

## 2022-06-05 DIAGNOSIS — N200 Calculus of kidney: Secondary | ICD-10-CM | POA: Diagnosis not present

## 2022-06-05 DIAGNOSIS — J984 Other disorders of lung: Secondary | ICD-10-CM | POA: Diagnosis not present

## 2022-06-05 DIAGNOSIS — R109 Unspecified abdominal pain: Secondary | ICD-10-CM | POA: Diagnosis not present

## 2022-06-05 DIAGNOSIS — Z043 Encounter for examination and observation following other accident: Secondary | ICD-10-CM | POA: Diagnosis not present

## 2022-06-05 DIAGNOSIS — J479 Bronchiectasis, uncomplicated: Secondary | ICD-10-CM | POA: Diagnosis not present

## 2022-06-05 DIAGNOSIS — R0689 Other abnormalities of breathing: Secondary | ICD-10-CM | POA: Diagnosis not present

## 2022-06-05 DIAGNOSIS — R112 Nausea with vomiting, unspecified: Secondary | ICD-10-CM | POA: Diagnosis not present

## 2022-06-05 DIAGNOSIS — Z79899 Other long term (current) drug therapy: Secondary | ICD-10-CM | POA: Diagnosis not present

## 2022-06-05 DIAGNOSIS — R918 Other nonspecific abnormal finding of lung field: Secondary | ICD-10-CM | POA: Diagnosis not present

## 2022-06-05 DIAGNOSIS — E079 Disorder of thyroid, unspecified: Secondary | ICD-10-CM | POA: Diagnosis not present

## 2022-06-05 DIAGNOSIS — M47812 Spondylosis without myelopathy or radiculopathy, cervical region: Secondary | ICD-10-CM | POA: Diagnosis not present

## 2022-06-05 DIAGNOSIS — K449 Diaphragmatic hernia without obstruction or gangrene: Secondary | ICD-10-CM | POA: Diagnosis not present

## 2022-06-13 ENCOUNTER — Ambulatory Visit: Payer: Medicare Other | Admitting: Urology

## 2022-06-14 DIAGNOSIS — Z299 Encounter for prophylactic measures, unspecified: Secondary | ICD-10-CM | POA: Diagnosis not present

## 2022-06-14 DIAGNOSIS — M1990 Unspecified osteoarthritis, unspecified site: Secondary | ICD-10-CM | POA: Diagnosis not present

## 2022-06-14 DIAGNOSIS — R42 Dizziness and giddiness: Secondary | ICD-10-CM | POA: Diagnosis not present

## 2022-06-14 DIAGNOSIS — N1831 Chronic kidney disease, stage 3a: Secondary | ICD-10-CM | POA: Diagnosis not present

## 2022-06-14 DIAGNOSIS — I700 Atherosclerosis of aorta: Secondary | ICD-10-CM | POA: Diagnosis not present

## 2022-07-09 DIAGNOSIS — M4319 Spondylolisthesis, multiple sites in spine: Secondary | ICD-10-CM | POA: Diagnosis not present

## 2022-07-23 ENCOUNTER — Ambulatory Visit: Payer: Medicare HMO | Attending: Cardiology | Admitting: Cardiology

## 2022-07-23 ENCOUNTER — Encounter: Payer: Self-pay | Admitting: Cardiology

## 2022-07-23 VITALS — BP 124/80 | HR 56 | Ht 64.0 in | Wt 145.2 lb

## 2022-07-23 DIAGNOSIS — R002 Palpitations: Secondary | ICD-10-CM | POA: Diagnosis not present

## 2022-07-23 DIAGNOSIS — I951 Orthostatic hypotension: Secondary | ICD-10-CM

## 2022-07-23 DIAGNOSIS — R0609 Other forms of dyspnea: Secondary | ICD-10-CM | POA: Diagnosis not present

## 2022-07-23 NOTE — Patient Instructions (Addendum)

## 2022-07-23 NOTE — Progress Notes (Signed)
    Cardiology Office Note  Date: 07/23/2022   ID: Debra Richards, DOB Nov 01, 1925, MRN [id number]  History of Present Illness: Debra Richards is a 89 y.o. female last seen in August 2023.  She is here today for a follow-up visit.  Reports having a fall back in January, was standing up and dressing herself, apparently had a syncopal event at that time.  She was seen in the ER at [facility].  No acute findings by head CT.  I went over her medications, she reports compliance with therapy including midodrine.  Blood pressure today is normal.  She also has vertigo symptoms and uses as needed meclizine.  She still lives in her own home and functions with ADLs.  She tells me that she did look at an independent/assisted living situation and did not want to make a change as yet.  I reviewed her lab work from January as noted below.  Physical Exam: VS:  BP 124/80   Pulse (!) 56   Ht 5' 4"$  (1.626 m)   Wt 145 lb 3.2 oz (65.9 kg)   SpO2 99%   BMI 24.92 kg/m , BMI Body mass index is 24.92 kg/m.  Wt Readings from Last 3 Encounters:  07/23/22 145 lb 3.2 oz (65.9 kg)  01/15/22 139 lb 9.6 oz (63.3 kg)  12/18/21 135 lb (61.2 kg)    General: Patient appears comfortable at rest. HEENT: Conjunctiva and lids normal. Neck: Supple, no elevated JVP or carotid bruits. Lungs: Clear to auscultation, nonlabored breathing at rest. Cardiac: Regular rate and rhythm, no S3, 1/6 systolic murmur. Extremities: No pitting edema.  ECG:  An ECG dated 07/23/2022 was personally reviewed today and demonstrated:  Cardiac with left bundle branch block.  Labwork:  July 2022: Cholesterol 179, triglycerides 93, HDL 59, LDL 103 January 2024: Hemoglobin 14.2, platelets 148, potassium 4.0, BUN 16, creatinine 0.78, AST 23, ALT 21  Other Studies Reviewed Today:  Head CT 07-02-22 [facility]): 1. No acute intracranial pathology.  2. No acute fracture or traumatic malalignment of the cervical  spine.    Assessment and Plan:  1.  History of palpitations with previously documented rare atrial and ventricular ectopy.  She reports no increasing symptoms, did not have any palpitations in association with her fall/syncopal event in January per discussion today.  She does have a left bundle branch block by ECG and conduction system disease.  Continue to follow.  2.  Orthostatic hypotension.  Blood pressure today is normal.  Plan to keep her on midodrine 2.5 mg twice daily for now, can be further uptitrated if necessary.  3.  Chronic dyspnea on exertion.  Echocardiography from June 2022 revealed LVEF 60 to 65%, normal RV contraction and estimated RVSP, no major valvular abnormalities.  Ischemic testing from November 2020 showed potential mild ischemic territory in the mid to basal inferoseptum versus variable soft tissue attenuation, overall low risk.  She does not report any worsening symptoms, remains functional with ADLs.  Disposition:  Follow up  6 months.  Signed, Satira Sark, M.D., F.A.C.C.

## 2022-07-25 ENCOUNTER — Ambulatory Visit: Payer: Medicare Other | Admitting: Urology

## 2022-07-30 ENCOUNTER — Other Ambulatory Visit: Payer: Self-pay | Admitting: Cardiology

## 2022-08-01 ENCOUNTER — Ambulatory Visit: Payer: Medicare Other | Admitting: Urology

## 2022-08-07 ENCOUNTER — Ambulatory Visit (INDEPENDENT_AMBULATORY_CARE_PROVIDER_SITE_OTHER): Payer: Medicare HMO | Admitting: Obstetrics & Gynecology

## 2022-08-07 ENCOUNTER — Encounter: Payer: Self-pay | Admitting: Obstetrics & Gynecology

## 2022-08-07 VITALS — BP 114/72 | HR 64 | Wt 144.0 lb

## 2022-08-07 DIAGNOSIS — N3946 Mixed incontinence: Secondary | ICD-10-CM

## 2022-08-07 DIAGNOSIS — M810 Age-related osteoporosis without current pathological fracture: Secondary | ICD-10-CM

## 2022-08-07 NOTE — Progress Notes (Signed)
    Debra Richards 11-Oct-1925 [id number]        89 y.o.  G2P2L2   RP: Urinary incontinence and Osteoporosis on Prolia  HPI:  S/P Total Hysterectomy, benign.  Urinary incontinence with Urgency and SUI, wearing a pad at all times. H/O Osteoporosis, improved to Osteopenia on Prolia.  Last BD 02/2021 Lt Total Femur T- Score -2.3. Last Prolia injection 05/22/22.  Physically active.  No recent fall.  No current fracture. BMI 24.72.    OB History  Gravida Para Term Preterm AB Living  2 2 2     2   SAB IAB Ectopic Multiple Live Births               # Outcome Date GA Lbr Len/2nd Weight Sex Delivery Anes PTL Lv  2 Term           1 Term             Past medical history,surgical history, problem list, medications, allergies, family history and social history were all reviewed and documented in the EPIC chart.   Directed ROS with pertinent positives and negatives documented in the history of present illness/assessment and plan.  Exam:  08/07/2022 11:59 AM  BP 114/72  Pulse 64  SpO2 99 %  Weight 144 lb (65.3 kg)   Other Vitals  BMI 24.72 kg/m2  BSA 1.72 m2     General appearance:  Normal  Heart RCR Lungs clear  Breast exam:  RT and Lt Breasts normal.  No axillary LN felt.  Abdomen: Normal  Gynecologic exam: Vulva normal.  Bimanual exam:  No pelvic mass, NT.   Assessment/Plan:  89 y.o. G2P2002   1. Postmenopausal osteoporosis S/P Total Hysterectomy, benign.  Urinary incontinence with Urgency and SUI, wearing a pad at all times. H/O Osteoporosis, improved to Osteopenia on Prolia.  Last BD 02/2021 Lt Total Femur T- Score -2.3. Last Prolia injection 05/22/22.  Physically active.  No recent fall. No current fracture. BMI 24.72.  Next Prolia injection end of June 2024.  Schedule BD in 02/2023.  2. Mixed stress and urge urinary incontinence Will continue using pads.  Other orders - cephALEXin (KEFLEX) 500 MG capsule; SMARTSIG:4 Capsule(s) By Mouth (Patient not taking: Reported on  08/07/2022) - denosumab (PROLIA) 60 MG/ML SOSY injection; Inject 60 mg into the skin every 6 (six) months.   [name] MD, 12:07 PM 08/07/2022

## 2022-08-08 ENCOUNTER — Telehealth: Payer: Self-pay | Admitting: *Deleted

## 2022-08-08 NOTE — Telephone Encounter (Signed)
Patient seen 08-07-22 by Dr. Dellis Filbert. Patient not due for next prolia until after 11-22-22. Will contact patient closer to time to set up nurse visit.   Encounter closed.

## 2022-08-08 NOTE — Telephone Encounter (Signed)
-----   Message from Leamon Arnt, [place] sent at 08/05/2022 10:55 AM EDT ----- Regarding: AEX+ Prolia This patient called because she received a recall letter for her AEX. She came Amy 2023 for her aex but she is not high risk so medicare will not pay for it this year. She wants to know if she needs to schedule anything to be able to continue her prolia.

## 2022-08-26 DIAGNOSIS — M2041 Other hammer toe(s) (acquired), right foot: Secondary | ICD-10-CM | POA: Diagnosis not present

## 2022-08-26 DIAGNOSIS — M7751 Other enthesopathy of right foot: Secondary | ICD-10-CM | POA: Diagnosis not present

## 2022-08-26 DIAGNOSIS — M79674 Pain in right toe(s): Secondary | ICD-10-CM | POA: Diagnosis not present

## 2022-08-26 DIAGNOSIS — M79671 Pain in right foot: Secondary | ICD-10-CM | POA: Diagnosis not present

## 2022-08-29 ENCOUNTER — Ambulatory Visit: Payer: Medicare HMO | Admitting: Urology

## 2022-08-29 ENCOUNTER — Encounter: Payer: Self-pay | Admitting: Urology

## 2022-08-29 VITALS — BP 134/64 | HR 70 | Ht 64.0 in | Wt 144.0 lb

## 2022-08-29 DIAGNOSIS — N3941 Urge incontinence: Secondary | ICD-10-CM

## 2022-08-29 DIAGNOSIS — N3281 Overactive bladder: Secondary | ICD-10-CM

## 2022-08-29 DIAGNOSIS — N200 Calculus of kidney: Secondary | ICD-10-CM

## 2022-08-29 LAB — BLADDER SCAN AMB NON-IMAGING: Scan Result: 0

## 2022-08-29 NOTE — Progress Notes (Signed)
post void residual = 0 ml

## 2022-08-29 NOTE — Progress Notes (Signed)
Subjective:  1. Urge incontinence      HPI: Debra Richards is a 89 year-old female established patient who is here for follow up of incontinence and stones.   08/29/22: Debra Richards returns today in f/u.  She wasn't able to do the PTNS because of transportation issues.  She is no longer on any meds because of side effects.  She has had no stone symptoms.    03/21/22: Debra Richards returns today in f/u.   She continues to have daytime frequency and nocturia and she leaks at night.  She has some dysuria.  She has had no hematuria or dysuria.     09/20/21: Debra Richards returns today in f/u for her history of incontinence and prior stones.  Her UA is clear.  She has frequency and nocturia q1-2 hrs.  She continues to wear pads. She was given Logan Bores and had some bloating so she stopped that.  She has had no hematuria or flank pain..   04/26/21: Debra Richards returns today today in f/u.  She was taken off of the Myrbetriq because of issues with orthostasis requiring hospitalization.   She has nocturia q1-2 hr and frequency.  She wears depends at night at pads during the day.  She has a history of stones but no flank pain or hematuria.  A CT in 8/22 showed stable bilateral renal stones with no change since 2017.    10/26/20: Debra Richards was given Myrbetriq 50mg  at her last visit and she is doing well with reduced incontinence.  She has cut it to 25mg  because of expense.  She held it and her symptoms recurred.   She didn't get the KUB that was ordered for prior to this visit.   She had a CT Chest in 4/22 that showed bilateral renal stones without obstruction.   A Lumbar MRI in 1/22 also showed the stone.  She has had no hematuria.  She has some right sided pain but she has a history of lumbar spine disease and had surgery in 8/21.    GU Hx: Debra Richards returns today with the complaint of progressive incontinence over the last 3 years but it has been present for a decade. She has tried [place] and [employer] without success. She remains  Myrbetriq 50mg  which helps but she still has nocturia x 4-5x.  She has urgency with UUI.  She has had no flank pain or hematuria and her UA is ok today.    She has had PTNS and Botox discussed but hasn't done either. She has marked frequency and urgency and will have UUI. She doesn't have SUI. She has nocturia with enuresis. She has no dysuria or hematuria. She has a history of stones with occasional mild right flank pain. She last passed a stone 4+ years ago. She has DDD with chronic back pain and is recovering from recent surgery by Dr. Vertell Limber. Her UA is ok.        ROS:  ROS:  A complete review of systems was performed.  All systems are negative except for pertinent findings as noted.   Review of Systems  Constitutional:  Positive for malaise/fatigue.  Eyes:  Positive for blurred vision.  Respiratory:  Positive for shortness of breath.   Gastrointestinal:  Positive for nausea.  Musculoskeletal:  Positive for back pain and joint pain.  Neurological:  Positive for dizziness and weakness.  Endo/Heme/Allergies:  Bruises/bleeds easily.  Psychiatric/Behavioral:  The patient is nervous/anxious.     Allergies  Allergen Reactions   Penicillins Hives  03/04/13: Pt has tolerated Amoxicillin/Unsyn without reaction   Contrast Media [Iodinated Contrast Media] Other (See Comments)    Arm swelled up and was painful after IVP years ago/    Levaquin [Levofloxacin In D5w] Other (See Comments)    Made her sick all over    Outpatient Encounter Medications as of 08/29/2022  Medication Sig Note   aspirin EC 81 MG tablet Take 81 mg by mouth daily. Swallow whole.    B Complex Vitamins (B-COMPLEX/B-12 PO) Take by mouth. occasionally    cephALEXin (KEFLEX) 500 MG capsule     cholecalciferol (VITAMIN D) 1000 units tablet Take 1,000 Units by mouth daily.     denosumab (PROLIA) 60 MG/ML SOSY injection Inject 60 mg into the skin every 6 (six) months.    levothyroxine (SYNTHROID, LEVOTHROID) 75 MCG tablet  Take 75 mcg by mouth daily before breakfast.    meclizine (ANTIVERT) 25 MG tablet Take 25 mg by mouth 2 (two) times daily as needed for dizziness. 03/07/2021: Takes rarely   midodrine (PROAMATINE) 2.5 MG tablet TAKE 1 TABLET BY MOUTH TWICE DAILY WITH A MEAL    Zinc 50 MG CAPS Take 1 capsule by mouth daily.    No facility-administered encounter medications on file as of 08/29/2022.    Past Medical History:  Diagnosis Date   Arthritis    Complication of anesthesia    Trouble waking up   GERD (gastroesophageal reflux disease)    History of kidney stones    Hypothyroidism    LBBB (left bundle branch block)    MVP (mitral valve prolapse)    Osteoporosis    Palpitations    RA (rheumatoid arthritis)    Right ureteral stone    Rosacea     Past Surgical History:  Procedure Laterality Date   BACK SURGERY     CATARACT EXTRACTION W/ INTRAOCULAR LENS  IMPLANT, BILATERAL  4 yrs ago   both eyes   CHOLECYSTECTOMY  2005   EXTRACORPOREAL SHOCK WAVE LITHOTRIPSY  09-24-10   and 1990   Femur fx     HIP ARTHROPLASTY Right 10/12/2012   Procedure: ARTHROPLASTY BIPOLAR HIP;  Surgeon: Gearlean Alf, MD;  Location: [facility];  Service: Orthopedics;  Laterality: Right;   KNEE ARTHROSCOPY  04/17/2011, right   Procedure: ARTHROSCOPY KNEE;  Surgeon: Gearlean Alf;  Location: [place];  Service: Orthopedics;  Laterality: Right;  WITH LATERAL MENISCAL DEBRIDEMENT   LUMBAR LAMINECTOMY/DECOMPRESSION MICRODISCECTOMY Right 12/28/2019   Procedure: Right Lumbar Four-Lumbar Five Microdiscectomy;  Surgeon: Erline Levine, MD;  Location: [facility];  Service: Neurosurgery;  Laterality: Right;  Right Lumbar Four-Lumbar Five Microdiscectomy   MOLES EXCISED  2012   on nose   Sternum Fx     TOTAL KNEE ARTHROPLASTY  03/23/2012   Procedure: TOTAL KNEE ARTHROPLASTY;  Surgeon: Gearlean Alf, MD;  Location: [facility];  Service: Orthopedics;  Laterality: Right;   VAGINAL HYSTERECTOMY  1970   Leiomyomata     Social History   Socioeconomic History   Marital status: Widowed    Spouse name: Not on file   Number of children: Not on file   Years of education: Not on file   Highest education level: Not on file  Occupational History   Not on file  Tobacco Use   Smoking status: Never   Smokeless tobacco: Never  Vaping Use   Vaping Use: Never used  Substance and Sexual Activity   Alcohol use: No    Alcohol/week: 0.0 standard drinks  of alcohol   Drug use: No   Sexual activity: Not Currently    Partners: Male    Birth control/protection: Post-menopausal, Surgical    Comment: 1st intercourse 45 yo-2 partners, hysterectomy  Other Topics Concern   Not on file  Social History Narrative   Lives in [place].   Lives c 2nd husband    NOK-Gina Axson   Social Determinants of Health   Financial Resource Strain: Not on file  Food Insecurity: Not on file  Transportation Needs: Not on file  Physical Activity: Not on file  Stress: Not on file  Social Connections: Not on file  Intimate Partner Violence: Not on file    Family History  Problem Relation Age of Onset   Cancer Mother        COLON   Heart disease Father        STROKE   Stroke Father    Ovarian cancer Maternal Grandmother    Crohn's disease Daughter        Objective: Vitals:   08/29/22 1447  BP: 134/64  Pulse: 70     Physical Exam  Lab Results:  Results for orders placed or performed in visit on 08/29/22 (from the past 24 hour(s))  Urinalysis, Routine w reflex microscopic     Status: None   Collection Time: 08/29/22  2:49 PM  Result Value Ref Range   Specific Gravity, UA 1.020 1.005 - 1.030   pH, UA 5.5 5.0 - 7.5   Color, UA Yellow Yellow   Appearance Ur Clear Clear   Leukocytes,UA Negative Negative   Protein,UA Negative Negative/Trace   Glucose, UA Negative Negative   Ketones, UA Negative Negative   RBC, UA Negative Negative   Bilirubin, UA Negative Negative   Urobilinogen, Ur 0.2 0.2 - 1.0 mg/dL    Nitrite, UA Negative Negative   Microscopic Examination Comment    Narrative   Performed at:  [address] - [facility] [address], [place], [place]  [id number] Lab Director: Chinita Pester MT, Phone:  [phone]    UA is clear.    BMET    Results for orders placed or performed in visit on 08/29/22 (from the past 24 hour(s))  Urinalysis, Routine w reflex microscopic     Status: None   Collection Time: 08/29/22  2:49 PM  Result Value Ref Range   Specific Gravity, UA 1.020 1.005 - 1.030   pH, UA 5.5 5.0 - 7.5   Color, UA Yellow Yellow   Appearance Ur Clear Clear   Leukocytes,UA Negative Negative   Protein,UA Negative Negative/Trace   Glucose, UA Negative Negative   Ketones, UA Negative Negative   RBC, UA Negative Negative   Bilirubin, UA Negative Negative   Urobilinogen, Ur 0.2 0.2 - 1.0 mg/dL   Nitrite, UA Negative Negative   Microscopic Examination Comment    Narrative   Performed at:  [address] - [facility] [address], [place], [place]  [id number] Lab Director: Chinita Pester MT, Phone:  [phone]       Studies/Results:  No results found.   Assessment & Plan: OAB wet.   She has failed or had side effects to all of our medication options.   I have recommended she consider PTNS but she wasn't able to get transportation.  I have discussed interstim and botox but would want urodynamics prior to that.  We also discussed the Purewick.  She is interested in seeing if she could get a Purewick device.  I gave her some  information to give her daughter about the Purewick.    Kidney stones.  She has had no flank pain.  The bilateral stones were stable over the last 5 years on CT from 8/22. She had a lumbar MRI on 03/18/22 and the kidneys were well visualized and there are renal cysts but no obvious stones.    No orders of the defined types were placed in this encounter.     Orders Placed This Encounter  Procedures   Urinalysis, Routine w reflex microscopic    BLADDER SCAN AMB NON-IMAGING      Return in about 6 months (around 02/28/2023).   CC: Ignatius SpeckingVyas, Dhruv B, MD      Bjorn PippinJohn Riddik Senna 08/30/2022 Patient ID: Megan Mansorothy Hanaway, female   DOB: Oct 07, 1925, 89 y.o.   MRN: [id number] Patient ID: Megan MansDorothy Coccia, female   DOB: Oct 07, 1925, 89 y.o.   MRN: [id number] Patient ID: Megan MansDorothy Ovens, female   DOB: Oct 07, 1925, 89 y.o.   MRN: [id number]

## 2022-08-30 LAB — URINALYSIS, ROUTINE W REFLEX MICROSCOPIC
Bilirubin, UA: NEGATIVE
Glucose, UA: NEGATIVE
Ketones, UA: NEGATIVE
Leukocytes,UA: NEGATIVE
Nitrite, UA: NEGATIVE
Protein,UA: NEGATIVE
RBC, UA: NEGATIVE
Specific Gravity, UA: 1.02 (ref 1.005–1.030)
Urobilinogen, Ur: 0.2 mg/dL (ref 0.2–1.0)
pH, UA: 5.5 (ref 5.0–7.5)

## 2022-09-20 DIAGNOSIS — Z1331 Encounter for screening for depression: Secondary | ICD-10-CM | POA: Diagnosis not present

## 2022-09-20 DIAGNOSIS — Z299 Encounter for prophylactic measures, unspecified: Secondary | ICD-10-CM | POA: Diagnosis not present

## 2022-09-20 DIAGNOSIS — Z0000 Encounter for general adult medical examination without abnormal findings: Secondary | ICD-10-CM | POA: Diagnosis not present

## 2022-09-20 DIAGNOSIS — I700 Atherosclerosis of aorta: Secondary | ICD-10-CM | POA: Diagnosis not present

## 2022-09-20 DIAGNOSIS — N183 Chronic kidney disease, stage 3 unspecified: Secondary | ICD-10-CM | POA: Diagnosis not present

## 2022-09-20 DIAGNOSIS — Z1339 Encounter for screening examination for other mental health and behavioral disorders: Secondary | ICD-10-CM | POA: Diagnosis not present

## 2022-09-20 DIAGNOSIS — D692 Other nonthrombocytopenic purpura: Secondary | ICD-10-CM | POA: Diagnosis not present

## 2022-09-20 DIAGNOSIS — Z7189 Other specified counseling: Secondary | ICD-10-CM | POA: Diagnosis not present

## 2022-10-22 ENCOUNTER — Telehealth: Payer: Self-pay | Admitting: *Deleted

## 2022-10-22 NOTE — Telephone Encounter (Signed)
Call to [employer], spoke with [place], insurance information updated to [employer].   Insurance information submitted to [employer] portal. Will await summary of benefits for prolia.

## 2022-10-22 NOTE — Telephone Encounter (Signed)
PA for prolia signed by Dr. Seymour Bars and faxed to [facility] Precertification Department at [phone]. Will await response.

## 2022-10-23 DIAGNOSIS — Z0000 Encounter for general adult medical examination without abnormal findings: Secondary | ICD-10-CM | POA: Diagnosis not present

## 2022-10-23 DIAGNOSIS — E039 Hypothyroidism, unspecified: Secondary | ICD-10-CM | POA: Diagnosis not present

## 2022-10-23 DIAGNOSIS — I700 Atherosclerosis of aorta: Secondary | ICD-10-CM | POA: Diagnosis not present

## 2022-10-23 DIAGNOSIS — R5383 Other fatigue: Secondary | ICD-10-CM | POA: Diagnosis not present

## 2022-10-23 DIAGNOSIS — Z299 Encounter for prophylactic measures, unspecified: Secondary | ICD-10-CM | POA: Diagnosis not present

## 2022-10-28 DIAGNOSIS — I739 Peripheral vascular disease, unspecified: Secondary | ICD-10-CM | POA: Diagnosis not present

## 2022-10-28 DIAGNOSIS — M79672 Pain in left foot: Secondary | ICD-10-CM | POA: Diagnosis not present

## 2022-10-28 DIAGNOSIS — M79674 Pain in right toe(s): Secondary | ICD-10-CM | POA: Diagnosis not present

## 2022-10-28 DIAGNOSIS — L110 Acquired keratosis follicularis: Secondary | ICD-10-CM | POA: Diagnosis not present

## 2022-10-28 DIAGNOSIS — M79671 Pain in right foot: Secondary | ICD-10-CM | POA: Diagnosis not present

## 2022-10-28 DIAGNOSIS — M79675 Pain in left toe(s): Secondary | ICD-10-CM | POA: Diagnosis not present

## 2022-11-04 DIAGNOSIS — E7800 Pure hypercholesterolemia, unspecified: Secondary | ICD-10-CM | POA: Diagnosis not present

## 2022-11-04 DIAGNOSIS — R5383 Other fatigue: Secondary | ICD-10-CM | POA: Diagnosis not present

## 2022-11-04 DIAGNOSIS — Z79899 Other long term (current) drug therapy: Secondary | ICD-10-CM | POA: Diagnosis not present

## 2022-11-04 DIAGNOSIS — E039 Hypothyroidism, unspecified: Secondary | ICD-10-CM | POA: Diagnosis not present

## 2022-11-06 ENCOUNTER — Telehealth: Payer: Self-pay | Admitting: *Deleted

## 2022-11-06 MED ORDER — DENOSUMAB 60 MG/ML ~~LOC~~ SOSY
60.0000 mg | PREFILLED_SYRINGE | Freq: Once | SUBCUTANEOUS | Status: AC
  Administered 2022-11-25: 60 mg via SUBCUTANEOUS

## 2022-11-06 NOTE — Telephone Encounter (Signed)
Deductible:  20% Coinsurance. No deductible.   OOP MAX: 4500.00, $585 met  Annual exam: OV 08/07/22 ML  Calcium:    8.9        Date: 06/05/22  Upcoming dental procedures: No   Hx of Kidney Disease: No   Last Bone Density Scan: 03/09/21  Is Prior Authorization needed: yes  Pt estimated Cost: $384  Coverage Details:Patient aware this is an estimate of benefits.  Spoke with patient, advised as seen above. Patient request to proceed with scheduling Prolia. Nurse visit scheduled for 11/25/22 at 1115. Patient verbalizes understanding and is agreeable.  Cc: Petra Kuba.

## 2022-11-11 NOTE — Telephone Encounter (Signed)
See telephone encounter dated 11/06/22.   Encounter closed.

## 2022-11-14 ENCOUNTER — Other Ambulatory Visit: Payer: Self-pay | Admitting: Cardiology

## 2022-11-25 ENCOUNTER — Ambulatory Visit (INDEPENDENT_AMBULATORY_CARE_PROVIDER_SITE_OTHER): Payer: Medicare HMO | Admitting: *Deleted

## 2022-11-25 DIAGNOSIS — M810 Age-related osteoporosis without current pathological fracture: Secondary | ICD-10-CM | POA: Diagnosis not present

## 2022-12-24 ENCOUNTER — Encounter (HOSPITAL_COMMUNITY): Payer: Self-pay | Admitting: Emergency Medicine

## 2022-12-24 ENCOUNTER — Other Ambulatory Visit: Payer: Self-pay

## 2022-12-24 ENCOUNTER — Emergency Department (HOSPITAL_COMMUNITY): Payer: Medicare HMO

## 2022-12-24 ENCOUNTER — Emergency Department (HOSPITAL_COMMUNITY)
Admission: EM | Admit: 2022-12-24 | Discharge: 2022-12-24 | Disposition: A | Discharge status: Home / Self Care | Payer: Medicare HMO | Attending: Emergency Medicine | Admitting: Emergency Medicine

## 2022-12-24 DIAGNOSIS — R29818 Other symptoms and signs involving the nervous system: Secondary | ICD-10-CM | POA: Diagnosis not present

## 2022-12-24 DIAGNOSIS — R42 Dizziness and giddiness: Secondary | ICD-10-CM | POA: Insufficient documentation

## 2022-12-24 DIAGNOSIS — R112 Nausea with vomiting, unspecified: Secondary | ICD-10-CM | POA: Diagnosis not present

## 2022-12-24 DIAGNOSIS — Z7982 Long term (current) use of aspirin: Secondary | ICD-10-CM | POA: Diagnosis not present

## 2022-12-24 DIAGNOSIS — Z743 Need for continuous supervision: Secondary | ICD-10-CM | POA: Diagnosis not present

## 2022-12-24 DIAGNOSIS — R110 Nausea: Secondary | ICD-10-CM | POA: Diagnosis not present

## 2022-12-24 DIAGNOSIS — I6782 Cerebral ischemia: Secondary | ICD-10-CM | POA: Diagnosis not present

## 2022-12-24 DIAGNOSIS — R1111 Vomiting without nausea: Secondary | ICD-10-CM | POA: Diagnosis not present

## 2022-12-24 LAB — URINALYSIS, ROUTINE W REFLEX MICROSCOPIC
Bilirubin Urine: NEGATIVE
Glucose, UA: NEGATIVE mg/dL
Hgb urine dipstick: NEGATIVE
Ketones, ur: 5 mg/dL — AB
Leukocytes,Ua: NEGATIVE
Nitrite: NEGATIVE
Protein, ur: NEGATIVE mg/dL
Specific Gravity, Urine: 1.009 (ref 1.005–1.030)
pH: 8 (ref 5.0–8.0)

## 2022-12-24 LAB — COMPREHENSIVE METABOLIC PANEL
ALT: 16 U/L (ref 0–44)
AST: 18 U/L (ref 15–41)
Albumin: 3.5 g/dL (ref 3.5–5.0)
Alkaline Phosphatase: 59 U/L (ref 38–126)
Anion gap: 5 (ref 5–15)
BUN: 14 mg/dL (ref 8–23)
CO2: 24 mmol/L (ref 22–32)
Calcium: 8.4 mg/dL — ABNORMAL LOW (ref 8.9–10.3)
Chloride: 106 mmol/L (ref 98–111)
Creatinine, Ser: 0.74 mg/dL (ref 0.44–1.00)
GFR, Estimated: >60 mL/min (ref >60)
Glucose, Bld: 99 mg/dL (ref 70–99)
Potassium: 4.1 mmol/L (ref 3.5–5.1)
Sodium: 135 mmol/L (ref 135–145)
Total Bilirubin: 0.8 mg/dL (ref 0.3–1.2)
Total Protein: 6.3 g/dL — ABNORMAL LOW (ref 6.5–8.1)

## 2022-12-24 LAB — CBC WITH DIFFERENTIAL/PLATELET
Abs Immature Granulocytes: 0.02 10*3/uL (ref 0.00–0.07)
Basophils Absolute: 0 10*3/uL (ref 0.0–0.1)
Basophils Relative: 0 %
Eosinophils Absolute: 0 10*3/uL (ref 0.0–0.5)
Eosinophils Relative: 0 %
HCT: 43.8 % (ref 36.0–46.0)
Hemoglobin: 14 g/dL (ref 12.0–15.0)
Immature Granulocytes: 0 %
Lymphocytes Relative: 10 %
Lymphs Abs: 0.7 10*3/uL (ref 0.7–4.0)
MCH: 31.8 pg (ref 26.0–34.0)
MCHC: 32 g/dL (ref 30.0–36.0)
MCV: 99.5 fL (ref 80.0–100.0)
Monocytes Absolute: 0.3 10*3/uL (ref 0.1–1.0)
Monocytes Relative: 5 %
Neutro Abs: 5.4 10*3/uL (ref 1.7–7.7)
Neutrophils Relative %: 85 %
Platelets: 137 10*3/uL — ABNORMAL LOW (ref 150–400)
RBC: 4.4 MIL/uL (ref 3.87–5.11)
RDW: 13.4 % (ref 11.5–15.5)
WBC: 6.4 10*3/uL (ref 4.0–10.5)
nRBC: 0 % (ref 0.0–0.2)

## 2022-12-24 LAB — LIPASE, BLOOD: Lipase: 30 U/L (ref 11–51)

## 2022-12-24 MED ORDER — ONDANSETRON HCL 4 MG/2ML IJ SOLN
4.0000 mg | Freq: Once | INTRAMUSCULAR | Status: AC
  Administered 2022-12-24: 4 mg via INTRAVENOUS
  Filled 2022-12-24: qty 2

## 2022-12-24 MED ORDER — MECLIZINE HCL 25 MG PO TABS: 25.0000 mg | ORAL_TABLET | Freq: Two times a day (BID) | ORAL | 0 refills | Status: AC | PRN

## 2022-12-24 MED ORDER — LORAZEPAM 0.5 MG PO TABS
0.5000 mg | ORAL_TABLET | Freq: Once | ORAL | Status: AC
  Administered 2022-12-24: 0.5 mg via ORAL
  Filled 2022-12-24: qty 1

## 2022-12-24 MED ORDER — SODIUM CHLORIDE 0.9 % IV BOLUS
500.0000 mL | Freq: Once | INTRAVENOUS | Status: AC
  Administered 2022-12-24: 500 mL via INTRAVENOUS

## 2022-12-24 MED ORDER — ONDANSETRON 4 MG PO TBDP
4.0000 mg | ORAL_TABLET | Freq: Four times a day (QID) | ORAL | 0 refills | Status: AC | PRN
Start: 2022-12-24 — End: ?

## 2022-12-24 MED ORDER — LORAZEPAM 1 MG PO TABS: 0.5000 mg | ORAL_TABLET | Freq: Three times a day (TID) | ORAL | 0 refills | Status: DC | PRN

## 2022-12-24 MED ORDER — MECLIZINE HCL 12.5 MG PO TABS
25.0000 mg | ORAL_TABLET | Freq: Once | ORAL | Status: AC
  Administered 2022-12-24: 25 mg via ORAL
  Filled 2022-12-24: qty 2

## 2022-12-24 NOTE — ED Provider Notes (Signed)
[place] EMERGENCY DEPARTMENT AT [facility] Provider Note   CSN: [id number] Arrival date & time: 12/24/22  1511     History  Chief Complaint  Patient presents with   Vomiting    Debra Richards is a 89 y.o. female.  The ER complaining of dizziness described as spinning and vertigo with nausea and vomiting.  States she went to bed around 10 PM last night and felt like her usual self.  She woke around 2 AM and when she stood up to go the bathroom felt everything spinning.  She states she had episodes like this before.  States they were usually due to dehydration.  She states every time she moves her head she gets very dizzy and vomits today.  Denies headache, no numbness ting or weakness, no chest pain or shortness of breath or other complaints.  She tried taking meclizine at home but immediately vomited after trying to take it.  HPI     Home Medications Prior to Admission medications   Medication Sig Start Date End Date Taking? Authorizing Provider  LORazepam (ATIVAN) 1 MG tablet Take 0.5 tablets (0.5 mg total) by mouth every 8 (eight) hours as needed for anxiety. 12/24/22  Yes Maria Gallicchio A, PA-C  ondansetron (ZOFRAN-ODT) 4 MG disintegrating tablet Take 1 tablet (4 mg total) by mouth every 6 (six) hours as needed for nausea or vomiting. 12/24/22  Yes Va Broadwell A, PA-C  aspirin EC 81 MG tablet Take 81 mg by mouth daily. Swallow whole.    [provider]  B Complex Vitamins (B-COMPLEX/B-12 PO) Take by mouth. occasionally    [provider]  cephALEXin (KEFLEX) 500 MG capsule  07/30/22   [provider]  cholecalciferol (VITAMIN D) 1000 units tablet Take 1,000 Units by mouth daily.     [provider]  denosumab (PROLIA) 60 MG/ML SOSY injection Inject 60 mg into the skin every 6 (six) months.    [provider]  levothyroxine (SYNTHROID, LEVOTHROID) 75 MCG tablet Take 75 mcg by mouth daily before breakfast.    [provider]  meclizine (ANTIVERT) 25 MG tablet Take 1 tablet (25 mg total) by mouth 2 (two) times daily as needed for dizziness. 12/24/22   Vinette Crites A, PA-C  midodrine (PROAMATINE) 2.5 MG tablet TAKE 1 TABLET BY MOUTH TWICE A DAY WITH FOOD 11/14/22   Jonelle Sidle, MD  Zinc 50 MG CAPS Take 1 capsule by mouth daily.    [provider]      Allergies    Penicillins, Contrast media [iodinated contrast media], and Levaquin [levofloxacin in d5w]    Review of Systems   Review of Systems  Physical Exam Updated Vital Signs BP (!) 118/52   Pulse 60   Temp (!) 97.3 F (36.3 C) (Oral)   Resp 16   Ht 5\' 4"  (1.626 m)   Wt 65.3 kg   SpO2 98%   BMI 24.71 kg/m  Physical Exam Vitals and nursing note reviewed.  Constitutional:      General: She is not in acute distress.    Appearance: She is well-developed.  HENT:     Head: Normocephalic and atraumatic.     Mouth/Throat:     Mouth: Mucous membranes are moist.  Eyes:     Conjunctiva/sclera: Conjunctivae normal.  Cardiovascular:     Rate and Rhythm: Normal rate and regular rhythm.     Heart sounds: No murmur heard. Pulmonary:     Effort: Pulmonary effort  is normal. No respiratory distress.     Breath sounds: Normal breath sounds.  Abdominal:     Palpations: Abdomen is soft.     Tenderness: There is no abdominal tenderness.  Musculoskeletal:        General: No swelling.     Cervical back: Neck supple.  Skin:    General: Skin is warm and dry.     Capillary Refill: Capillary refill takes less than 2 seconds.  Neurological:     General: No focal deficit present.     Mental Status: She is alert and oriented to person, place, and time.  Psychiatric:        Mood and Affect: Mood normal.     ED Results / Procedures / Treatments   Labs (all labs ordered are listed, but only abnormal results are displayed) Labs Reviewed  CBC WITH DIFFERENTIAL/PLATELET - Abnormal; Notable for the following components:      Result  Value   Platelets 137 (*)    All other components within normal limits  COMPREHENSIVE METABOLIC PANEL - Abnormal; Notable for the following components:   Calcium 8.4 (*)    Total Protein 6.3 (*)    All other components within normal limits  URINALYSIS, ROUTINE W REFLEX MICROSCOPIC - Abnormal; Notable for the following components:   APPearance HAZY (*)    Ketones, ur 5 (*)    All other components within normal limits  LIPASE, BLOOD    EKG None  Radiology MR BRAIN WO CONTRAST  Result Date: 12/24/2022 CLINICAL DATA:  Neuro deficit, acute, stroke suspected EXAM: MRI HEAD WITHOUT CONTRAST TECHNIQUE: Multiplanar, multiecho pulse sequences of the brain and surrounding structures were obtained without intravenous contrast. COMPARISON:  CT head 06/05/22 FINDINGS: Brain: Negative for an acute infarct. No hemorrhage. No hydrocephalus. No extra-axial fluid collection. Sequela of moderate chronic microvascular ischemic change. No mass effect. No mass lesion. Vascular: Normal arterial flow voids. Asymmetrically decreased flow void in the left transverse and sigmoid sinuses most likely physiologic. Skull and upper cervical spine: Normal marrow signal. Sinuses/Orbits: Small bilateral mastoid effusions. No middle ear effusion. Paranasal sinuses are clear. Bilateral lens replacement. Orbits are otherwise unremarkable. Other: None IMPRESSION: No acute intracranial abnormality. Electronically Signed   By: Lorenza Cambridge M.D.   On: 12/24/2022 19:59    Procedures Procedures    Medications Ordered in ED Medications  sodium chloride 0.9 % bolus 500 mL (0 mLs Intravenous Stopped 12/24/22 1814)  ondansetron (ZOFRAN) injection 4 mg (4 mg Intravenous Given 12/24/22 1714)  LORazepam (ATIVAN) tablet 0.5 mg (0.5 mg Oral Given 12/24/22 1717)  meclizine (ANTIVERT) tablet 25 mg (25 mg Oral Given 12/24/22 1717)    ED Course/ Medical Decision Making/ A&P                                 Medical Decision Making DDx: CVA,  BPPV, dehydration, gastroenteritis, other ED course: Patient presents with vertigo and nausea vomiting associated with this, worse anytime she sits up or moves her head.  Not able to ambulate upon arrival.  She states is the same as prior episodes of vertigo.  No nystagmus on exam, ANO x 4 with no other focal deficits.  Given patient's age and risk factors MRI ordered and shows no stroke and no other abnormalities.  Given half milligram of Ativan with meclizine and Zofran and she is feeling much better, able to walk without difficulty.  Discussed supportive care  at home.  Given meclizine and Zofran.  Advise she could take a half a milligram of Ativan at home with these medications do not help by themselves.  Given precautions for this, advised to take this if there is somebody else in the house with her as it could make her sleepy and increased fall risk.  Given strict return precautions.  Amount and/or Complexity of Data Reviewed External Data Reviewed: labs and notes. Labs: ordered.    Details: BC, CMP lipase and urinalysis were all reassuring Radiology: ordered and independent interpretation performed.    Details: MRI brain shows no stroke, no hemorrhage, no exact brain lesions, ECG/medicine tests: ordered and independent interpretation performed.    Details: Sinus rhythm left bundle branch block  Risk Prescription drug management.           Final Clinical Impression(s) / ED Diagnoses Final diagnoses:  Vertigo    Rx / DC Orders ED Discharge Orders          Ordered    ondansetron (ZOFRAN-ODT) 4 MG disintegrating tablet  Every 6 hours PRN        12/24/22 2014    meclizine (ANTIVERT) 25 MG tablet  2 times daily PRN        12/24/22 2014    LORazepam (ATIVAN) 1 MG tablet  Every 8 hours PRN        12/24/22 2016              Josem Kaufmann 12/24/22 2021    Rondel Baton, MD 12/25/22 1226

## 2022-12-24 NOTE — Discharge Instructions (Addendum)
Pleasure taking care of you today.  You were seen for dizziness.  We gave you fluids, Ativan, Zofran and meclizine.  Your blood work was reassuring.  Your MRI was normal and did not show signs of stroke as a cause of your dizziness.  Since you are feeling better we will discharge you home with nausea medicine and meclizine for dizziness.  I also prescribed a small amount of Ativan.  If you are having dizziness that does not get better with the meclizine you can try a half a tablet of the Ativan.  Be very careful with this as it can make you sleepy.  Come back to the ER for new or worsening symptoms.

## 2022-12-24 NOTE — ED Triage Notes (Signed)
Pt c/o n/v and dizziness that started yesterday.

## 2023-01-02 ENCOUNTER — Telehealth: Payer: Self-pay | Admitting: *Deleted

## 2023-01-02 NOTE — Telephone Encounter (Signed)
Transition Care Management Unsuccessful Follow-up Telephone Call  Date of discharge and from where:  [facility] 12/[facility]/20[facility]  Attempts:  2nd Attempt  Reason for unsuccessful TCM follow-up call:  No answer/busy

## 2023-01-07 DIAGNOSIS — M159 Polyosteoarthritis, unspecified: Secondary | ICD-10-CM | POA: Diagnosis not present

## 2023-01-07 DIAGNOSIS — G5600 Carpal tunnel syndrome, unspecified upper limb: Secondary | ICD-10-CM | POA: Diagnosis not present

## 2023-01-07 DIAGNOSIS — R42 Dizziness and giddiness: Secondary | ICD-10-CM | POA: Diagnosis not present

## 2023-01-07 DIAGNOSIS — Z299 Encounter for prophylactic measures, unspecified: Secondary | ICD-10-CM | POA: Diagnosis not present

## 2023-01-23 ENCOUNTER — Encounter: Payer: Self-pay | Admitting: Urology

## 2023-01-23 ENCOUNTER — Ambulatory Visit: Payer: Medicare HMO | Admitting: Urology

## 2023-01-23 VITALS — BP 107/72 | HR 85 | Ht 64.0 in | Wt 143.9 lb

## 2023-01-23 DIAGNOSIS — R3915 Urgency of urination: Secondary | ICD-10-CM

## 2023-01-23 DIAGNOSIS — R351 Nocturia: Secondary | ICD-10-CM

## 2023-01-23 DIAGNOSIS — Z87442 Personal history of urinary calculi: Secondary | ICD-10-CM | POA: Diagnosis not present

## 2023-01-23 DIAGNOSIS — N3941 Urge incontinence: Secondary | ICD-10-CM

## 2023-01-23 DIAGNOSIS — N200 Calculus of kidney: Secondary | ICD-10-CM

## 2023-01-23 LAB — URINALYSIS, ROUTINE W REFLEX MICROSCOPIC
Bilirubin, UA: NEGATIVE
Glucose, UA: NEGATIVE
Ketones, UA: NEGATIVE
Nitrite, UA: NEGATIVE
Protein,UA: NEGATIVE
Specific Gravity, UA: 1.02 (ref 1.005–1.030)
Urobilinogen, Ur: 1 mg/dL (ref 0.2–1.0)
pH, UA: 6 (ref 5.0–7.5)

## 2023-01-23 LAB — MICROSCOPIC EXAMINATION: Bacteria, UA: NONE SEEN

## 2023-01-23 LAB — BLADDER SCAN AMB NON-IMAGING: Scan Result: 0

## 2023-01-23 NOTE — Progress Notes (Signed)
post void residual = 0 ml

## 2023-01-23 NOTE — Progress Notes (Signed)
Subjective:  1. Urge incontinence   2. Urgency of urination   3. Nocturia   4. Renal stones      HPI: Debra Richards is a 89 year-old female established patient who is here for follow up of incontinence and stones.   01/23/23: Debra Richards returns today in f/u. She continues to have marked incontinence.  She is using Mx pads and she leaks through the pads.   The Myrbetriq was the last thing we tried and it didn't work.  She has had no flank pain or hematuria.  She had a normal CMP and CBC on 12/24/22.   08/29/22: Debra Richards returns today in f/u.  She wasn't able to do the PTNS because of transportation issues.  She is no longer on any meds because of side effects.  She has had no stone symptoms.    03/21/22: Debra Richards returns today in f/u.   She continues to have daytime frequency and nocturia and she leaks at night.  She has some dysuria.  She has had no hematuria or dysuria.     09/20/21: Debra Richards returns today in f/u for her history of incontinence and prior stones.  Her UA is clear.  She has frequency and nocturia q1-2 hrs.  She continues to wear pads. She was given Leslye Peer and had some bloating so she stopped that.  She has had no hematuria or flank pain..   04/26/21: Debra Richards returns today today in f/u.  She was taken off of the Myrbetriq because of issues with orthostasis requiring hospitalization.   She has nocturia q1-2 hr and frequency.  She wears depends at night at pads during the day.  She has a history of stones but no flank pain or hematuria.  A CT in 8/22 showed stable bilateral renal stones with no change since 2017.    10/26/20: Debra Richards was given Myrbetriq 50mg  at her last visit and she is doing well with reduced incontinence.  She has cut it to 25mg  because of expense.  She held it and her symptoms recurred.   She didn't get the KUB that was ordered for prior to this visit.   She had a CT Chest in 4/22 that showed bilateral renal stones without obstruction.   A Lumbar MRI in 1/22 also showed  the stone.  She has had no hematuria.  She has some right sided pain but she has a history of lumbar spine disease and had surgery in 8/21.    GU Hx: Debra Richards returns today with the complaint of progressive incontinence over the last 3 years but it has been present for a decade. She has tried Reunion and [employer] without success. She remains Myrbetriq 50mg  which helps but she still has nocturia x 4-5x.  She has urgency with UUI.  She has had no flank pain or hematuria and her UA is ok today.    She has had PTNS and Botox discussed but hasn't done either. She has marked frequency and urgency and will have UUI. She doesn't have SUI. She has nocturia with enuresis. She has no dysuria or hematuria. She has a history of stones with occasional mild right flank pain. She last passed a stone 4+ years ago. She has DDD with chronic back pain and is recovering from recent surgery by Dr. Venetia Maxon. Her UA is ok.        ROS:  ROS:  A complete review of systems was performed.  All systems are negative except for pertinent findings as noted.   Review of  Systems  Musculoskeletal:  Positive for back pain.  Neurological:  Positive for dizziness.    Allergies  Allergen Reactions   Penicillins Hives    03/04/13: Pt has tolerated Amoxicillin/Unsyn without reaction   Contrast Media [Iodinated Contrast Media] Other (See Comments)    Arm swelled up and was painful after IVP years ago/    Levaquin [Levofloxacin In D5w] Other (See Comments)    Made her sick all over    Outpatient Encounter Medications as of 01/23/2023  Medication Sig   aspirin EC 81 MG tablet Take 81 mg by mouth daily. Swallow whole.   B Complex Vitamins (B-COMPLEX/B-12 PO) Take by mouth. occasionally   cephALEXin (KEFLEX) 500 MG capsule    cholecalciferol (VITAMIN D) 1000 units tablet Take 1,000 Units by mouth daily.    denosumab (PROLIA) 60 MG/ML SOSY injection Inject 60 mg into the skin every 6 (six) months.   levothyroxine (SYNTHROID,  LEVOTHROID) 75 MCG tablet Take 75 mcg by mouth daily before breakfast.   LORazepam (ATIVAN) 1 MG tablet Take 0.5 tablets (0.5 mg total) by mouth every 8 (eight) hours as needed for anxiety.   meclizine (ANTIVERT) 25 MG tablet Take 1 tablet (25 mg total) by mouth 2 (two) times daily as needed for dizziness.   midodrine (PROAMATINE) 2.5 MG tablet TAKE 1 TABLET BY MOUTH TWICE A DAY WITH FOOD   ondansetron (ZOFRAN-ODT) 4 MG disintegrating tablet Take 1 tablet (4 mg total) by mouth every 6 (six) hours as needed for nausea or vomiting.   Zinc 50 MG CAPS Take 1 capsule by mouth daily.   No facility-administered encounter medications on file as of 01/23/2023.    Past Medical History:  Diagnosis Date   Arthritis    Complication of anesthesia    Trouble waking up   GERD (gastroesophageal reflux disease)    History of kidney stones    Hypothyroidism    LBBB (left bundle branch block)    MVP (mitral valve prolapse)    Osteoporosis    Palpitations    RA (rheumatoid arthritis) (HCC)    Right ureteral stone    Rosacea     Past Surgical History:  Procedure Laterality Date   BACK SURGERY     CATARACT EXTRACTION W/ INTRAOCULAR LENS  IMPLANT, BILATERAL  4 yrs ago   both eyes   CHOLECYSTECTOMY  2005   EXTRACORPOREAL SHOCK WAVE LITHOTRIPSY  09-24-10   and 1990   Femur fx     HIP ARTHROPLASTY Right 10/12/2012   Procedure: ARTHROPLASTY BIPOLAR HIP;  Surgeon: Loanne Drilling, MD;  Location: [facility];  Service: Orthopedics;  Laterality: Right;   KNEE ARTHROSCOPY  04/17/2011, right   Procedure: ARTHROSCOPY KNEE;  Surgeon: Loanne Drilling;  Location: [facility];  Service: Orthopedics;  Laterality: Right;  WITH LATERAL MENISCAL DEBRIDEMENT   LUMBAR LAMINECTOMY/DECOMPRESSION MICRODISCECTOMY Right 12/28/2019   Procedure: Right Lumbar Four-Lumbar Five Microdiscectomy;  Surgeon: Maeola Harman, MD;  Location: [facility];  Service: Neurosurgery;  Laterality: Right;  Right Lumbar Four-Lumbar Five  Microdiscectomy   MOLES EXCISED  2012   on nose   Sternum Fx     TOTAL KNEE ARTHROPLASTY  03/23/2012   Procedure: TOTAL KNEE ARTHROPLASTY;  Surgeon: Loanne Drilling, MD;  Location: [facility];  Service: Orthopedics;  Laterality: Right;   VAGINAL HYSTERECTOMY  1970   Leiomyomata    Social History   Socioeconomic History   Marital status: Widowed    Spouse name: Not on file  Number of children: Not on file   Years of education: Not on file   Highest education level: Not on file  Occupational History   Not on file  Tobacco Use   Smoking status: Never   Smokeless tobacco: Never  Vaping Use   Vaping status: Never Used  Substance and Sexual Activity   Alcohol use: No    Alcohol/week: 0.0 standard drinks of alcohol   Drug use: No   Sexual activity: Not Currently    Partners: Male    Birth control/protection: Post-menopausal, Surgical    Comment: 1st intercourse 73 yo-2 partners, hysterectomy  Other Topics Concern   Not on file  Social History Narrative   Lives in [place].   Lives c 2nd husband    NOK-Gina Axson   Social Determinants of Health   Financial Resource Strain: Not on file  Food Insecurity: Not on file  Transportation Needs: Not on file  Physical Activity: Not on file  Stress: Not on file  Social Connections: Not on file  Intimate Partner Violence: Not on file    Family History  Problem Relation Age of Onset   Cancer Mother        COLON   Heart disease Father        STROKE   Stroke Father    Ovarian cancer Maternal Grandmother    Crohn's disease Daughter        Objective: Vitals:   01/23/23 0942  BP: 107/72  Pulse: 85     Physical Exam  Lab Results:  Results for orders placed or performed in visit on 01/23/23 (from the past 24 hour(s))  Urinalysis, Routine w reflex microscopic     Status: Abnormal   Collection Time: 01/23/23 10:06 AM  Result Value Ref Range   Specific Gravity, UA 1.020 1.005 - 1.030   pH, UA 6.0 5.0 - 7.5   Color, UA  Yellow Yellow   Appearance Ur Clear Clear   Leukocytes,UA Trace (A) Negative   Protein,UA Negative Negative/Trace   Glucose, UA Negative Negative   Ketones, UA Negative Negative   RBC, UA Trace (A) Negative   Bilirubin, UA Negative Negative   Urobilinogen, Ur 1.0 0.2 - 1.0 mg/dL   Nitrite, UA Negative Negative   Microscopic Examination See below:    Narrative   Performed at:  [address] - [facility] [address] [place], [place]  [id number] Lab Director: Chinita Pester MT, Phone:  [phone]  Microscopic Examination     Status: Abnormal   Collection Time: 01/23/23 10:06 AM   Urine  Result Value Ref Range   WBC, UA 6-10 (A) 0 - 5 /hpf   RBC, Urine 0-2 0 - 2 /hpf   Epithelial Cells (non renal) 0-10 0 - 10 /hpf   Bacteria, UA None seen None seen/Few   Narrative   Performed at:  [address] - [facility] [address], [place], [place]  [id number] Lab Director: Chinita Pester MT, Phone:  [phone]     UA is clear.    BMET    Results for orders placed or performed in visit on 01/23/23 (from the past 24 hour(s))  Urinalysis, Routine w reflex microscopic     Status: Abnormal   Collection Time: 01/23/23 10:06 AM  Result Value Ref Range   Specific Gravity, UA 1.020 1.005 - 1.030   pH, UA 6.0 5.0 - 7.5   Color, UA Yellow Yellow   Appearance Ur Clear Clear   Leukocytes,UA Trace (A) Negative  Protein,UA Negative Negative/Trace   Glucose, UA Negative Negative   Ketones, UA Negative Negative   RBC, UA Trace (A) Negative   Bilirubin, UA Negative Negative   Urobilinogen, Ur 1.0 0.2 - 1.0 mg/dL   Nitrite, UA Negative Negative   Microscopic Examination See below:    Narrative   Performed at:  [address] - [facility] [address], [place], [place]  [id number] Lab Director: Chinita Pester MT, Phone:  [phone]  Microscopic Examination     Status: Abnormal   Collection Time: 01/23/23 10:06 AM   Urine  Result Value Ref Range   WBC, UA 6-10 (A) 0 - 5 /hpf   RBC,  Urine 0-2 0 - 2 /hpf   Epithelial Cells (non renal) 0-10 0 - 10 /hpf   Bacteria, UA None seen None seen/Few   Narrative   Performed at:  [address] - [facility] [address] [place], [place]  [id number] Lab Director: Chinita Pester MT, Phone:  [phone]    UA has 6-10 WBC but no bacteria.     Studies/Results:  MR BRAIN WO CONTRAST  Result Date: 12/24/2022 CLINICAL DATA:  Neuro deficit, acute, stroke suspected EXAM: MRI HEAD WITHOUT CONTRAST TECHNIQUE: Multiplanar, multiecho pulse sequences of the brain and surrounding structures were obtained without intravenous contrast. COMPARISON:  CT head 06/05/22 FINDINGS: Brain: Negative for an acute infarct. No hemorrhage. No hydrocephalus. No extra-axial fluid collection. Sequela of moderate chronic microvascular ischemic change. No mass effect. No mass lesion. Vascular: Normal arterial flow voids. Asymmetrically decreased flow void in the left transverse and sigmoid sinuses most likely physiologic. Skull and upper cervical spine: Normal marrow signal. Sinuses/Orbits: Small bilateral mastoid effusions. No middle ear effusion. Paranasal sinuses are clear. Bilateral lens replacement. Orbits are otherwise unremarkable. Other: None IMPRESSION: No acute intracranial abnormality. Electronically Signed   By: Lorenza Cambridge M.D.   On: 12/24/2022 19:59     Assessment & Plan: OAB wet.   She has failed or had side effects to all of our medication options.   I am going to get her set up for Urodynamics and then return to discuss options.   Kidney stones.  She has had no flank pain or hematuria.  The bilateral stones were stable over the last 5 years on CT from 8/22. She had a lumbar MRI on 03/18/22 and the kidneys were well visualized and there are renal cysts but no obvious stones.    No orders of the defined types were placed in this encounter.     Orders Placed This Encounter  Procedures   Microscopic Examination   Urinalysis, Routine w reflex  microscopic   Ambulatory referral to Urology    Referral Priority:   Urgent    Referral Type:   Consultation    Referral Reason:   Specialty Services Required    Referred to Provider:   Bjorn Pippin, MD    Requested Specialty:   Urology    Number of Visits Requested:   1   BLADDER SCAN AMB NON-IMAGING      Return for Next available f/u with results of the Urodyamics. .   CC: Ignatius Specking, MD      Bjorn Pippin 01/24/2023 Patient ID: Megan Mans, female   DOB: [birth year], 89 y.o.   MRN: [id number]

## 2023-01-29 ENCOUNTER — Encounter: Payer: Self-pay | Admitting: Cardiology

## 2023-01-29 ENCOUNTER — Ambulatory Visit: Payer: Medicare HMO | Attending: Cardiology | Admitting: Cardiology

## 2023-01-29 VITALS — BP 102/72 | HR 59 | Ht 64.0 in | Wt 149.0 lb

## 2023-01-29 DIAGNOSIS — R002 Palpitations: Secondary | ICD-10-CM

## 2023-01-29 DIAGNOSIS — I951 Orthostatic hypotension: Secondary | ICD-10-CM | POA: Diagnosis not present

## 2023-01-29 MED ORDER — MIDODRINE HCL 5 MG PO TABS: 5.0000 mg | ORAL_TABLET | Freq: Two times a day (BID) | ORAL | 6 refills | Status: DC

## 2023-01-29 NOTE — Progress Notes (Signed)
    Cardiology Office Note  Date: 01/29/2023   ID: Jaide Deveaux, DOB 1925-12-17, MRN [id number]  History of Present Illness: Debra Richards is a 89 y.o. female last seen in February.  She is here today with her daughter for a follow-up visit.  She continues to have episodes of lightheadedness, also being treated for possible vertigo with meclizine.  She was seen in the ER in July for similar symptoms.  No frank syncope.  She states she is careful when she sits up or gets out of bed.  She does not report any falls.  I reviewed her medications and we discussed increasing midodrine to 5 mg twice daily as she does have relatively low blood pressure at baseline and propensity to orthostasis.  Physical Exam: VS:  BP 102/72   Pulse (!) 59   Ht 5\' 4"  (1.626 m)   Wt 149 lb (67.6 kg)   SpO2 100%   BMI 25.58 kg/m , BMI Body mass index is 25.58 kg/m.  Wt Readings from Last 3 Encounters:  01/29/23 149 lb (67.6 kg)  01/23/23 143 lb 14.4 oz (65.3 kg)  12/24/22 143 lb 15.4 oz (65.3 kg)    General: Patient appears comfortable at rest. HEENT: Conjunctiva and lids normal. Lungs: Clear to auscultation, nonlabored breathing at rest. Cardiac: Regular rate and rhythm, no S3, 1/6 systolic murmur..  ECG:  An ECG dated 12/24/2022 was personally reviewed today and demonstrated:  Sinus rhythm with left bundle branch block.  Labwork: June 2024: Hemoglobin 13.6, platelets 191, BUN 20, creatinine 1.04, potassium 4.3, AST 20, ALT 11, cholesterol 201, triglycerides 89, HDL 57, LDL 128, TSH 4.45 1/32/4401: ALT 16; AST 18; BUN 14; Creatinine, Ser 0.74; Hemoglobin 14.0; Platelets 137; Potassium 4.1; Sodium 135   Other Studies Reviewed Today:  No interval cardiac testing for review today.  Assessment and Plan:  1.  History of palpitations with previously documented rare atrial and ventricular ectopy.  No worsening symptoms or association with lightheadedness.   2.  Orthostatic hypotension.  Given her  continued spells as discussed above, we will increase her midodrine to 5 mg twice daily for now.   3.  Chronic dyspnea on exertion.  Echocardiography from June 2022 revealed LVEF 60 to 65%, normal RV contraction and estimated RVSP, no major valvular abnormalities.  Ischemic testing from November 2020 showed potential mild ischemic territory in the mid to basal inferoseptum versus variable soft tissue attenuation, overall low risk.  She does not describe any recurrent exertional chest discomfort.  She remains on aspirin daily.  Disposition:  Follow up  6 months.  Signed, Jonelle Sidle, M.D., F.A.C.C. [facility]

## 2023-01-29 NOTE — Patient Instructions (Signed)
Medication Instructions:   Increase Midodrine to 5mg  twice a day   Continue all other medications.     Labwork:  none  Testing/Procedures:  none  Follow-Up:  6 months   Any Other Special Instructions Will Be Listed Below (If Applicable).   If you need a refill on your cardiac medications before your next appointment, please call your pharmacy.

## 2023-03-03 DIAGNOSIS — R350 Frequency of micturition: Secondary | ICD-10-CM | POA: Diagnosis not present

## 2023-03-06 ENCOUNTER — Ambulatory Visit: Payer: Medicare HMO | Admitting: Urology

## 2023-03-20 ENCOUNTER — Encounter: Payer: Self-pay | Admitting: Urology

## 2023-03-20 ENCOUNTER — Ambulatory Visit: Payer: Medicare HMO | Admitting: Urology

## 2023-03-20 VITALS — BP 133/58 | HR 59 | Ht 64.0 in | Wt 149.0 lb

## 2023-03-20 DIAGNOSIS — N3281 Overactive bladder: Secondary | ICD-10-CM

## 2023-03-20 DIAGNOSIS — R3129 Other microscopic hematuria: Secondary | ICD-10-CM | POA: Diagnosis not present

## 2023-03-20 DIAGNOSIS — N3941 Urge incontinence: Secondary | ICD-10-CM | POA: Diagnosis not present

## 2023-03-20 DIAGNOSIS — N200 Calculus of kidney: Secondary | ICD-10-CM

## 2023-03-20 DIAGNOSIS — Z87442 Personal history of urinary calculi: Secondary | ICD-10-CM

## 2023-03-20 DIAGNOSIS — R351 Nocturia: Secondary | ICD-10-CM | POA: Diagnosis not present

## 2023-03-20 LAB — URINALYSIS, ROUTINE W REFLEX MICROSCOPIC
Bilirubin, UA: NEGATIVE
Glucose, UA: NEGATIVE
Ketones, UA: NEGATIVE
Nitrite, UA: NEGATIVE
Protein,UA: NEGATIVE
Specific Gravity, UA: 1.02 (ref 1.005–1.030)
Urobilinogen, Ur: 2 mg/dL — ABNORMAL HIGH (ref 0.2–1.0)
pH, UA: 6 (ref 5.0–7.5)

## 2023-03-20 LAB — MICROSCOPIC EXAMINATION: Bacteria, UA: NONE SEEN

## 2023-03-20 NOTE — Progress Notes (Signed)
Subjective:  1. Urge incontinence   2. Nocturia   3. Renal stones   4. Microhematuria      HPI: Debra Richards is a 89 year-old female established patient who is here for follow up of incontinence and stones.   03/20/23: Debra Richards returns today in f/u.  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  Her flowrate was reduced but she emptied well.  She has 6-10 WBC and 11-30 RBC's in the UA today but does have a history of stones with a right renal stone on her last imaging.  She has no flank pain or gross hematuria.   01/23/23: Debra Richards returns today in f/u. She continues to have marked incontinence.  She is using Mx pads and she leaks through the pads.   The Myrbetriq was the last thing we tried and it didn't work.  She has had no flank pain or hematuria.  She had a normal CMP and CBC on 12/24/22.   08/29/22: Debra Richards returns today in f/u.  She wasn't able to do the PTNS because of transportation issues.  She is no longer on any meds because of side effects.  She has had no stone symptoms.    03/21/22: Debra Richards returns today in f/u.   She continues to have daytime frequency and nocturia and she leaks at night.  She has some dysuria.  She has had no hematuria or dysuria.     09/20/21: Debra Richards returns today in f/u for her history of incontinence and prior stones.  Her UA is clear.  She has frequency and nocturia q1-2 hrs.  She continues to wear pads. She was given Leslye Peer and had some bloating so she stopped that.  She has had no hematuria or flank pain..   04/26/21: Debra Richards returns today today in f/u.  She was taken off of the Myrbetriq because of issues with orthostasis requiring hospitalization.   She has nocturia q1-2 hr and frequency.  She wears depends at night at pads during the day.  She has a history of stones but no flank pain or hematuria.  A CT in 8/22 showed stable bilateral renal stones with no change since 2017.    10/26/20:  Debra Richards was given Myrbetriq 50mg  at her last visit and she is doing well with reduced incontinence.  She has cut it to 25mg  because of expense.  She held it and her symptoms recurred.   She didn't get the KUB that was ordered for prior to this visit.   She had a CT Chest in 4/22 that showed bilateral renal stones without obstruction.   A Lumbar MRI in 1/22 also showed the stone.  She has had no hematuria.  She has some right sided pain but she has a history of lumbar spine disease and had surgery in 8/21.    GU Hx: Debra Richards returns today with the complaint of progressive incontinence over the last 3 years but it has been present for a decade. She has tried Reunion and [employer] without success. She remains Myrbetriq 50mg  which helps but she still has nocturia x 4-5x.  She has urgency with UUI.  She has had no flank pain or hematuria and her UA is ok today.    She has had PTNS and Botox discussed but hasn't done either. She has marked frequency and urgency and will have UUI. She doesn't have SUI. She has nocturia with enuresis. She has no dysuria or  hematuria. She has a history of stones with occasional mild right flank pain. She last passed a stone 4+ years ago. She has DDD with chronic back pain and is recovering from recent surgery by Dr. Venetia Maxon. Her UA is ok.        ROS:  ROS:  A complete review of systems was performed.  All systems are negative except for pertinent findings as noted.   Review of Systems  All other systems reviewed and are negative.   Allergies  Allergen Reactions   Penicillins Hives    03/04/13: Pt has tolerated Amoxicillin/Unsyn without reaction   Contrast Media [Iodinated Contrast Media] Other (See Comments)    Arm swelled up and was painful after IVP years ago/    Levaquin [Levofloxacin In D5w] Other (See Comments)    Made her sick all over    Outpatient Encounter Medications as of 03/20/2023  Medication Sig   aspirin EC 81 MG tablet Take 81 mg by mouth daily.  Swallow whole.   B Complex Vitamins (B-COMPLEX/B-12 PO) Take by mouth. occasionally   cephALEXin (KEFLEX) 500 MG capsule    cholecalciferol (VITAMIN D) 1000 units tablet Take 1,000 Units by mouth daily.    denosumab (PROLIA) 60 MG/ML SOSY injection Inject 60 mg into the skin every 6 (six) months.   levothyroxine (SYNTHROID, LEVOTHROID) 75 MCG tablet Take 75 mcg by mouth daily before breakfast.   meclizine (ANTIVERT) 25 MG tablet Take 1 tablet (25 mg total) by mouth 2 (two) times daily as needed for dizziness.   midodrine (PROAMATINE) 5 MG tablet Take 1 tablet (5 mg total) by mouth 2 (two) times daily.   ondansetron (ZOFRAN-ODT) 4 MG disintegrating tablet Take 1 tablet (4 mg total) by mouth every 6 (six) hours as needed for nausea or vomiting.   predniSONE (DELTASONE) 10 MG tablet Take 10 mg by mouth 2 (two) times daily.   Zinc 50 MG CAPS Take 1 capsule by mouth daily.   No facility-administered encounter medications on file as of 03/20/2023.    Past Medical History:  Diagnosis Date   Arthritis    Complication of anesthesia    Trouble waking up   GERD (gastroesophageal reflux disease)    History of kidney stones    Hypothyroidism    LBBB (left bundle branch block)    MVP (mitral valve prolapse)    Osteoporosis    Palpitations    RA (rheumatoid arthritis) (HCC)    Right ureteral stone    Rosacea     Past Surgical History:  Procedure Laterality Date   BACK SURGERY     CATARACT EXTRACTION W/ INTRAOCULAR LENS  IMPLANT, BILATERAL  4 yrs ago   both eyes   CHOLECYSTECTOMY  2005   EXTRACORPOREAL SHOCK WAVE LITHOTRIPSY  09-24-10   and 1990   Femur fx     HIP ARTHROPLASTY Right 10/12/2012   Procedure: ARTHROPLASTY BIPOLAR HIP;  Surgeon: Loanne Drilling, MD;  Location: [facility];  Service: Orthopedics;  Laterality: Right;   KNEE ARTHROSCOPY  04/17/2011, right   Procedure: ARTHROSCOPY KNEE;  Surgeon: Loanne Drilling;  Location: [facility];  Service: Orthopedics;   Laterality: Right;  WITH LATERAL MENISCAL DEBRIDEMENT   LUMBAR LAMINECTOMY/DECOMPRESSION MICRODISCECTOMY Right 12/28/2019   Procedure: Right Lumbar Four-Lumbar Five Microdiscectomy;  Surgeon: Maeola Harman, MD;  Location: [facility];  Service: Neurosurgery;  Laterality: Right;  Right Lumbar Four-Lumbar Five Microdiscectomy   MOLES EXCISED  2012   on nose   Sternum Fx  TOTAL KNEE ARTHROPLASTY  03/23/2012   Procedure: TOTAL KNEE ARTHROPLASTY;  Surgeon: Loanne Drilling, MD;  Location: [facility];  Service: Orthopedics;  Laterality: Right;   VAGINAL HYSTERECTOMY  1970   Leiomyomata    Social History   Socioeconomic History   Marital status: Widowed    Spouse name: Not on file   Number of children: Not on file   Years of education: Not on file   Highest education level: Not on file  Occupational History   Not on file  Tobacco Use   Smoking status: Never   Smokeless tobacco: Never  Vaping Use   Vaping status: Never Used  Substance and Sexual Activity   Alcohol use: No    Alcohol/week: 0.0 standard drinks of alcohol   Drug use: No   Sexual activity: Not Currently    Partners: Male    Birth control/protection: Post-menopausal, Surgical    Comment: 1st intercourse 21 yo-2 partners, hysterectomy  Other Topics Concern   Not on file  Social History Narrative   Lives in [place].   Lives c 2nd husband    NOK-Gina Axson   Social Determinants of Health   Financial Resource Strain: Not on file  Food Insecurity: Not on file  Transportation Needs: Not on file  Physical Activity: Not on file  Stress: Not on file  Social Connections: Not on file  Intimate Partner Violence: Not on file    Family History  Problem Relation Age of Onset   Cancer Mother        COLON   Heart disease Father        STROKE   Stroke Father    Ovarian cancer Maternal Grandmother    Crohn's disease Daughter        Objective: Vitals:   03/20/23 0954  BP: (!) 133/58  Pulse: (!) 59     Physical  Exam Vitals reviewed.  Constitutional:      Appearance: Normal appearance.  Neurological:     Mental Status: She is alert.     Lab Results:  Results for orders placed or performed in visit on 03/20/23 (from the past 24 hour(s))  Urinalysis, Routine w reflex microscopic     Status: Abnormal   Collection Time: 03/20/23  9:56 AM  Result Value Ref Range   Specific Gravity, UA 1.020 1.005 - 1.030   pH, UA 6.0 5.0 - 7.5   Color, UA Yellow Yellow   Appearance Ur Clear Clear   Leukocytes,UA 1+ (A) Negative   Protein,UA Negative Negative/Trace   Glucose, UA Negative Negative   Ketones, UA Negative Negative   RBC, UA 2+ (A) Negative   Bilirubin, UA Negative Negative   Urobilinogen, Ur 2.0 (H) 0.2 - 1.0 mg/dL   Nitrite, UA Negative Negative   Microscopic Examination See below:    Narrative   Performed at:  [address] - [facility] [address], [place], [place]  [id number] Lab Director: Chinita Pester MT, Phone:  [phone]  Microscopic Examination     Status: Abnormal   Collection Time: 03/20/23  9:56 AM   Urine  Result Value Ref Range   WBC, UA 6-10 (A) 0 - 5 /hpf   RBC, Urine 11-30 (A) 0 - 2 /hpf   Epithelial Cells (non renal) 0-10 0 - 10 /hpf   Crystals Present (A) N/A   Crystal Type Amorphous Sediment N/A   Bacteria, UA None seen None seen/Few   Narrative   Performed at:  01 - [facility]  [id number], [place], [place]  [id number] Lab Director: Chinita Pester MT, Phone:  [phone]      UA is has 6-10 WBC and 11-30 RBC's.         Studies/Results:  MR BRAIN WO CONTRAST  Result Date: 12/24/2022 CLINICAL DATA:  Neuro deficit, acute, stroke suspected EXAM: MRI HEAD WITHOUT CONTRAST TECHNIQUE: Multiplanar, multiecho pulse sequences of the brain and surrounding structures were obtained without intravenous contrast. COMPARISON:  CT head 06/05/22 FINDINGS: Brain: Negative for an acute infarct. No hemorrhage. No hydrocephalus. No extra-axial fluid  collection. Sequela of moderate chronic microvascular ischemic change. No mass effect. No mass lesion. Vascular: Normal arterial flow voids. Asymmetrically decreased flow void in the left transverse and sigmoid sinuses most likely physiologic. Skull and upper cervical spine: Normal marrow signal. Sinuses/Orbits: Small bilateral mastoid effusions. No middle ear effusion. Paranasal sinuses are clear. Bilateral lens replacement. Orbits are otherwise unremarkable. Other: None IMPRESSION: No acute intracranial abnormality. Electronically Signed   By: Lorenza Cambridge M.D.   On: 12/24/2022 19:59    Urodynamics reviewed.   Assessment & Plan: OAB wet.   She has a small capacity bladder with DI and UUI on urodynamics and primarily has problems at night.   I discussed Botox since she has failed meds and PTNS and she is agreeable to consider it.  I will refer her to Dr. Arita Miss.  Kidney stones.  She has had no flank pain or hematuria.  The bilateral stones were stable over the last 5 years on CT from 8/22. She had a lumbar MRI on 03/18/22 and the kidneys were well visualized and there are renal cysts but no obvious stones.    Microhematuria.  If she has botox she will get cystoscopy.   Her kidneys were ok a year ago so I don't think she needs upper tract imaging. .  No orders of the defined types were placed in this encounter.     Orders Placed This Encounter  Procedures   Microscopic Examination   Abdomen 1 view (KUB)    Standing Status:   Future    Standing Expiration Date:   03/19/2024    Order Specific Question:   Reason for Exam (SYMPTOM  OR DIAGNOSIS REQUIRED)    Answer:   renal stones    Order Specific Question:   Preferred imaging location?    Answer:   [facility]   Urinalysis, Routine w reflex microscopic   Ambulatory referral to Urology    Referral Priority:   Routine    Referral Type:   Consultation    Referral Reason:   Specialty Services Required    Referred to Provider:   Noel Christmas, MD    Requested Specialty:   Urology    Number of Visits Requested:   1      Return in about 3 months (around 06/20/2023).   CC: Ignatius Specking, MD      Bjorn Pippin 03/21/2023 Patient ID: Megan Mans, female   DOB: 03/24/1926, 89 y.o.   MRN: [id number]

## 2023-03-28 ENCOUNTER — Telehealth: Payer: Self-pay | Admitting: Urology

## 2023-03-28 NOTE — Telephone Encounter (Signed)
Patient daughter called to follow up on referral to Dr Arita Miss ? She called [facility] trying to schedule but they have not received referral or notes on why she needs to go there.

## 2023-04-02 DIAGNOSIS — M25551 Pain in right hip: Secondary | ICD-10-CM | POA: Diagnosis not present

## 2023-04-02 DIAGNOSIS — R262 Difficulty in walking, not elsewhere classified: Secondary | ICD-10-CM | POA: Diagnosis not present

## 2023-04-02 DIAGNOSIS — M79604 Pain in right leg: Secondary | ICD-10-CM | POA: Diagnosis not present

## 2023-04-02 NOTE — Telephone Encounter (Signed)
Daughter called this morning about getting appointment schedule , she was checking on referral to [facility] , I transferred her to [facility].  Patient daughter very upset that no one is returning her call, its delaying patient's care.

## 2023-04-02 NOTE — Telephone Encounter (Signed)
Daughter called back.  [facility] has told her that there was not a referral to their office for patient.  Please contact daughter today  please Almira Coaster [phone]

## 2023-04-03 ENCOUNTER — Ambulatory Visit: Payer: Medicare HMO | Admitting: Urology

## 2023-04-03 DIAGNOSIS — R310 Gross hematuria: Secondary | ICD-10-CM

## 2023-04-03 DIAGNOSIS — N3281 Overactive bladder: Secondary | ICD-10-CM | POA: Diagnosis not present

## 2023-04-03 DIAGNOSIS — N3091 Cystitis, unspecified with hematuria: Secondary | ICD-10-CM

## 2023-04-03 LAB — MICROSCOPIC EXAMINATION: RBC, Urine: >30 /[HPF] — AB (ref 0–2)

## 2023-04-03 LAB — URINALYSIS, ROUTINE W REFLEX MICROSCOPIC
Bilirubin, UA: NEGATIVE
Glucose, UA: NEGATIVE
Ketones, UA: NEGATIVE
Nitrite, UA: POSITIVE — AB
Specific Gravity, UA: 1.015 (ref 1.005–1.030)
Urobilinogen, Ur: 1 mg/dL (ref 0.2–1.0)
pH, UA: 6 (ref 5.0–7.5)

## 2023-04-03 MED ORDER — SULFAMETHOXAZOLE-TRIMETHOPRIM 800-160 MG PO TABS
0.5000 | ORAL_TABLET | Freq: Two times a day (BID) | ORAL | 0 refills | Status: DC
Start: 2023-04-03 — End: 2023-08-06

## 2023-04-03 NOTE — Progress Notes (Signed)
Subjective:  1. Gross hematuria   2. Hemorrhagic cystitis      HPI: Debra Richards is a 89 year-old female established patient who is here for follow up of incontinence and stones.   04/03/23: Debra Richards returns today with the onset this morning of dysuria and gross hematuria.  She has persistent frequency and urgency with UUI.  She has had no fever.  Her UA looks infected.   03/20/23: Debra Richards returns today in f/u.  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  Her flowrate was reduced but she emptied well.  She has 6-10 WBC and 11-30 RBC's in the UA today but does have a history of stones with a right renal stone on her last imaging.  She has no flank pain or gross hematuria.   01/23/23: Debra Richards returns today in f/u. She continues to have marked incontinence.  She is using Mx pads and she leaks through the pads.   The Myrbetriq was the last thing we tried and it didn't work.  She has had no flank pain or hematuria.  She had a normal CMP and CBC on 12/24/22.   08/29/22: Debra Richards returns today in f/u.  She wasn't able to do the PTNS because of transportation issues.  She is no longer on any meds because of side effects.  She has had no stone symptoms.    03/21/22: Debra Richards returns today in f/u.   She continues to have daytime frequency and nocturia and she leaks at night.  She has some dysuria.  She has had no hematuria or dysuria.     09/20/21: Debra Richards returns today in f/u for her history of incontinence and prior stones.  Her UA is clear.  She has frequency and nocturia q1-2 hrs.  She continues to wear pads. She was given Leslye Peer and had some bloating so she stopped that.  She has had no hematuria or flank pain..   04/26/21: Debra Richards returns today today in f/u.  She was taken off of the Myrbetriq because of issues with orthostasis requiring hospitalization.   She has nocturia q1-2 hr and frequency.  She wears depends at night at pads  during the day.  She has a history of stones but no flank pain or hematuria.  A CT in 8/22 showed stable bilateral renal stones with no change since 2017.    10/26/20: Debra Richards was given Myrbetriq 50mg  at her last visit and she is doing well with reduced incontinence.  She has cut it to 25mg  because of expense.  She held it and her symptoms recurred.   She didn't get the KUB that was ordered for prior to this visit.   She had a CT Chest in 4/22 that showed bilateral renal stones without obstruction.   A Lumbar MRI in 1/22 also showed the stone.  She has had no hematuria.  She has some right sided pain but she has a history of lumbar spine disease and had surgery in 8/21.    GU Hx: Debra Richards returns today with the complaint of progressive incontinence over the last 3 years but it has been present for a decade. She has tried Reunion and [employer] without success. She remains Myrbetriq 50mg  which helps but she still has nocturia x 4-5x.  She has urgency with UUI.  She has had no flank pain or hematuria and her UA is ok today.    She has had PTNS and Botox discussed  but hasn't done either. She has marked frequency and urgency and will have UUI. She doesn't have SUI. She has nocturia with enuresis. She has no dysuria or hematuria. She has a history of stones with occasional mild right flank pain. She last passed a stone 4+ years ago. She has DDD with chronic back pain and is recovering from recent surgery by Dr. Venetia Maxon. Her UA is ok.        ROS:  ROS:  A complete review of systems was performed.  All systems are negative except for pertinent findings as noted.   Review of Systems  All other systems reviewed and are negative.   Allergies  Allergen Reactions   Penicillins Hives    03/04/13: Pt has tolerated Amoxicillin/Unsyn without reaction   Contrast Media [Iodinated Contrast Media] Other (See Comments)    Arm swelled up and was painful after IVP years ago/    Levaquin [Levofloxacin In D5w] Other (See  Comments)    Made her sick all over    Outpatient Encounter Medications as of 04/03/2023  Medication Sig   sulfamethoxazole-trimethoprim (BACTRIM DS) 800-160 MG tablet Take 0.5 tablets by mouth 2 (two) times daily.   aspirin EC 81 MG tablet Take 81 mg by mouth daily. Swallow whole.   B Complex Vitamins (B-COMPLEX/B-12 PO) Take by mouth. occasionally   cephALEXin (KEFLEX) 500 MG capsule    cholecalciferol (VITAMIN D) 1000 units tablet Take 1,000 Units by mouth daily.    denosumab (PROLIA) 60 MG/ML SOSY injection Inject 60 mg into the skin every 6 (six) months.   levothyroxine (SYNTHROID, LEVOTHROID) 75 MCG tablet Take 75 mcg by mouth daily before breakfast.   meclizine (ANTIVERT) 25 MG tablet Take 1 tablet (25 mg total) by mouth 2 (two) times daily as needed for dizziness.   midodrine (PROAMATINE) 5 MG tablet Take 1 tablet (5 mg total) by mouth 2 (two) times daily.   ondansetron (ZOFRAN-ODT) 4 MG disintegrating tablet Take 1 tablet (4 mg total) by mouth every 6 (six) hours as needed for nausea or vomiting.   predniSONE (DELTASONE) 10 MG tablet Take 10 mg by mouth 2 (two) times daily.   Zinc 50 MG CAPS Take 1 capsule by mouth daily.   No facility-administered encounter medications on file as of 04/03/2023.    Past Medical History:  Diagnosis Date   Arthritis    Complication of anesthesia    Trouble waking up   GERD (gastroesophageal reflux disease)    History of kidney stones    Hypothyroidism    LBBB (left bundle branch block)    MVP (mitral valve prolapse)    Osteoporosis    Palpitations    RA (rheumatoid arthritis) (HCC)    Right ureteral stone    Rosacea     Past Surgical History:  Procedure Laterality Date   BACK SURGERY     CATARACT EXTRACTION W/ INTRAOCULAR LENS  IMPLANT, BILATERAL  4 yrs ago   both eyes   CHOLECYSTECTOMY  2005   EXTRACORPOREAL SHOCK WAVE LITHOTRIPSY  09-24-10   and 1990   Femur fx     HIP ARTHROPLASTY Right 10/12/2012   Procedure: ARTHROPLASTY  BIPOLAR HIP;  Surgeon: Loanne Drilling, MD;  Location: [facility];  Service: Orthopedics;  Laterality: Right;   KNEE ARTHROSCOPY  04/17/2011, right   Procedure: ARTHROSCOPY KNEE;  Surgeon: Loanne Drilling;  Location: [facility];  Service: Orthopedics;  Laterality: Right;  WITH LATERAL MENISCAL DEBRIDEMENT   LUMBAR LAMINECTOMY/DECOMPRESSION MICRODISCECTOMY Right  12/28/2019   Procedure: Right Lumbar Four-Lumbar Five Microdiscectomy;  Surgeon: Maeola Harman, MD;  Location: [facility];  Service: Neurosurgery;  Laterality: Right;  Right Lumbar Four-Lumbar Five Microdiscectomy   MOLES EXCISED  2012   on nose   Sternum Fx     TOTAL KNEE ARTHROPLASTY  03/23/2012   Procedure: TOTAL KNEE ARTHROPLASTY;  Surgeon: Loanne Drilling, MD;  Location: [facility];  Service: Orthopedics;  Laterality: Right;   VAGINAL HYSTERECTOMY  1970   Leiomyomata    Social History   Socioeconomic History   Marital status: Widowed    Spouse name: Not on file   Number of children: Not on file   Years of education: Not on file   Highest education level: Not on file  Occupational History   Not on file  Tobacco Use   Smoking status: Never   Smokeless tobacco: Never  Vaping Use   Vaping status: Never Used  Substance and Sexual Activity   Alcohol use: No    Alcohol/week: 0.0 standard drinks of alcohol   Drug use: No   Sexual activity: Not Currently    Partners: Male    Birth control/protection: Post-menopausal, Surgical    Comment: 1st intercourse 57 yo-2 partners, hysterectomy  Other Topics Concern   Not on file  Social History Narrative   Lives in [place].   Lives c 2nd husband    NOK-Gina Axson   Social Determinants of Health   Financial Resource Strain: Not on file  Food Insecurity: Not on file  Transportation Needs: Not on file  Physical Activity: Not on file  Stress: Not on file  Social Connections: Not on file  Intimate Partner Violence: Not on file    Family History  Problem Relation Age of Onset    Cancer Mother        COLON   Heart disease Father        STROKE   Stroke Father    Ovarian cancer Maternal Grandmother    Crohn's disease Daughter        Objective: There were no vitals filed for this visit.    Physical Exam Vitals reviewed.  Constitutional:      Appearance: Normal appearance.  Neurological:     Mental Status: She is alert.     Lab Results:  Results for orders placed or performed in visit on 04/03/23 (from the past 24 hour(s))  Urinalysis, Routine w reflex microscopic     Status: Abnormal   Collection Time: 04/03/23  3:17 PM  Result Value Ref Range   Specific Gravity, UA 1.015 1.005 - 1.030   pH, UA 6.0 5.0 - 7.5   Color, UA Yellow Yellow   Appearance Ur Clear Clear   Leukocytes,UA 1+ (A) Negative   Protein,UA Trace Negative/Trace   Glucose, UA Negative Negative   Ketones, UA Negative Negative   RBC, UA 3+ (A) Negative   Bilirubin, UA Negative Negative   Urobilinogen, Ur 1.0 0.2 - 1.0 mg/dL   Nitrite, UA Positive (A) Negative   Microscopic Examination See below:    Narrative   Performed at:  [address] - [facility] [address], [place], [place]  [id number] Lab Director: Chinita Pester MT, Phone:  [phone]  Microscopic Examination     Status: Abnormal   Collection Time: 04/03/23  3:17 PM   Urine  Result Value Ref Range   WBC, UA 6-10 (A) 0 - 5 /hpf   RBC, Urine >30 (A) 0 - 2 /hpf   Epithelial  Cells (non renal) 0-10 0 - 10 /hpf   Bacteria, UA Many (A) None seen/Few   Narrative   Performed at:  [address] - [facility] [address] [place], [place]  [id number] Lab Director: Chinita Pester MT, Phone:  [phone]       UA is has 6-10 WBC and 11-30 RBC's.         Studies/Results:  No results found.  Urodynamics reviewed.   Assessment & Plan: Hemorrhagic cystitis.   I will culture the urine and give her bactrim.    OAB wet.   She has a small capacity bladder with DI and UUI on urodynamics and primarily has  problems at night.   I discussed Botox since she has failed meds and PTNS and she is agreeable to consider it.  I will refer her to Dr. Arita Miss.  I will try to expedite.   Kidney stones.  She has had no flank pain or hematuria.  The bilateral stones were stable over the last 5 years on CT from 8/22. She had a lumbar MRI on 03/18/22 and the kidneys were well visualized and there are renal cysts but no obvious stones.    Microhematuria.  If she has botox she will get cystoscopy.   Her kidneys were ok a year ago so I don't think she needs upper tract imaging. .  Meds ordered this encounter  Medications   sulfamethoxazole-trimethoprim (BACTRIM DS) 800-160 MG tablet    Sig: Take 0.5 tablets by mouth 2 (two) times daily.    Dispense:  7 tablet    Refill:  0    Please split pills for patient.      Orders Placed This Encounter  Procedures   Urine Culture   Microscopic Examination   Urinalysis, Routine w reflex microscopic      Return for I will try to get her set up with Dr. Arita Miss in [place] for the Botox.  .   CC: Ignatius Specking, MD      Bjorn Pippin 04/04/2023 Patient ID: Megan Mans, female   DOB: 05/09/1926, 89 y.o.   MRN: [id number]

## 2023-04-03 NOTE — Telephone Encounter (Signed)
Did you ever reach back out to patient after talking with Marchelle Folks? Just wondering if I can complete this task

## 2023-04-04 ENCOUNTER — Encounter: Payer: Self-pay | Admitting: Urology

## 2023-04-07 DIAGNOSIS — M25551 Pain in right hip: Secondary | ICD-10-CM | POA: Diagnosis not present

## 2023-04-07 DIAGNOSIS — M79604 Pain in right leg: Secondary | ICD-10-CM | POA: Diagnosis not present

## 2023-04-07 DIAGNOSIS — R262 Difficulty in walking, not elsewhere classified: Secondary | ICD-10-CM | POA: Diagnosis not present

## 2023-04-07 LAB — URINE CULTURE

## 2023-04-09 DIAGNOSIS — R262 Difficulty in walking, not elsewhere classified: Secondary | ICD-10-CM | POA: Diagnosis not present

## 2023-04-09 DIAGNOSIS — M79604 Pain in right leg: Secondary | ICD-10-CM | POA: Diagnosis not present

## 2023-04-09 DIAGNOSIS — M25551 Pain in right hip: Secondary | ICD-10-CM | POA: Diagnosis not present

## 2023-04-14 DIAGNOSIS — M25551 Pain in right hip: Secondary | ICD-10-CM | POA: Diagnosis not present

## 2023-04-14 DIAGNOSIS — R262 Difficulty in walking, not elsewhere classified: Secondary | ICD-10-CM | POA: Diagnosis not present

## 2023-04-14 DIAGNOSIS — M79604 Pain in right leg: Secondary | ICD-10-CM | POA: Diagnosis not present

## 2023-04-16 ENCOUNTER — Telehealth: Payer: Self-pay | Admitting: *Deleted

## 2023-04-16 DIAGNOSIS — M810 Age-related osteoporosis without current pathological fracture: Secondary | ICD-10-CM

## 2023-04-16 DIAGNOSIS — R7989 Other specified abnormal findings of blood chemistry: Secondary | ICD-10-CM

## 2023-04-16 NOTE — Telephone Encounter (Signed)
Call returned to patient. Patient inquiring about scheduling next Prolia injection. Last injection received 11/25/22. Patient asking if next injection will be received in December? Advised 180 days from 11/25/22 is 05/24/23, can receive injection once benefits are reviewed. Advised I will forward to [facility] for return call in December. Patient appreciative of call.   Routing to [employer].

## 2023-04-21 ENCOUNTER — Telehealth: Payer: Self-pay

## 2023-04-21 DIAGNOSIS — R262 Difficulty in walking, not elsewhere classified: Secondary | ICD-10-CM | POA: Diagnosis not present

## 2023-04-21 DIAGNOSIS — M79604 Pain in right leg: Secondary | ICD-10-CM | POA: Diagnosis not present

## 2023-04-21 DIAGNOSIS — M25551 Pain in right hip: Secondary | ICD-10-CM | POA: Diagnosis not present

## 2023-04-21 MED ORDER — CEPHALEXIN 500 MG PO CAPS: 500.0000 mg | ORAL_CAPSULE | Freq: Three times a day (TID) | ORAL | 0 refills | Status: DC

## 2023-04-21 NOTE — Telephone Encounter (Signed)
Patient notified of prescription sent to pharmacy. Patient voiced understanding.  Patient currently scheduled for [facility] referral.

## 2023-04-21 NOTE — Telephone Encounter (Signed)
-----   Message from Bjorn Pippin sent at 04/08/2023  7:33 AM EST ----- She needs to stop the bactrim and get started on cephalexin 500mg  po tid #21.   She also needs to get posted for Botox with Dr. Ronne Binning.   I won't be there until Thursday so if he can put in the posting request, that will expedite the process.   I thought he was doing them in the office, but Kourtney sent me a message that the first would be in the OR and he had time on 12/19.  I don't won't to miss the opportunity for her. ----- Message ----- From: Ferdinand Lango, RN Sent: 04/07/2023   6:40 PM EST To: Bjorn Pippin, MD; Grier Rocher, CMA  Please review- please send back to clinical pool

## 2023-04-28 DIAGNOSIS — M79604 Pain in right leg: Secondary | ICD-10-CM | POA: Diagnosis not present

## 2023-04-28 DIAGNOSIS — R262 Difficulty in walking, not elsewhere classified: Secondary | ICD-10-CM | POA: Diagnosis not present

## 2023-04-28 DIAGNOSIS — M25551 Pain in right hip: Secondary | ICD-10-CM | POA: Diagnosis not present

## 2023-05-16 NOTE — Telephone Encounter (Signed)
Please advise on calcium.

## 2023-05-16 NOTE — Telephone Encounter (Signed)
Call returned to patient. Patient requesting status of Prolia.   Annual exam: OV 08/07/2022 -ML   Calcium: 8.4        Date: 12/24/2022     Upcoming dental procedures: No   Hx of Kidney Disease: No   Last Bone Density Scan: 03/09/21 with PCP -[facility]. Advised overdue for BMD. Patient will contact PCP to schedule. Patient aware to return call if unable to schedule.    Advised I have submitted for review of benefits.   Will send to Dr. Karma Greaser to review calcium and recommendations. Advised patient our office will f/u with recommendations once completed. Patient appreciative of f/u.   Dr. Karma Greaser -please review and advise.   Cc: Petra Kuba

## 2023-05-26 NOTE — Telephone Encounter (Signed)
Patient calling for update on Prolia.  Benefits -pending review for 05/2023 per [employer]   Dr. Edward Jolly -please advise on calcium. Last calcium 12/24/22, 8.4.

## 2023-05-26 NOTE — Telephone Encounter (Signed)
Patient's corrected calcium level (using calculator to check calcium and albumin level) is low at 8.8.   The corrected calcium level is low for some time.   I recommend she see an endocrinologist for further evaluation and treatment of her bone health.   She is due for a bone density at [facility].

## 2023-05-27 NOTE — Telephone Encounter (Signed)
 Patients daughter, Tillman, returned call, ok per dpr.   Request to review plan of care, advised as seen below. Tillman states she is sure BMD completed this year. Tillman will contact PCP to request and have copy of report faxed to [facility], number provided. Gina aware to return call if any further assistance needed or questions.   Routing FYI.

## 2023-05-27 NOTE — Telephone Encounter (Signed)
 Spoke with patient. Advised per Dr. Nikki. Patient agreeable to referral to endocrinology, request provider in or around [place].   Referral placed to [facility].   Patient will call PCP to schedule BMD, is aware to return call if further assistance is needed. Patient verbalizes understanding and is agreeable.   Routing to provider for final review. Patient is agreeable to disposition. Will close encounter.  Cc: Damien KYM Hedge

## 2023-06-10 ENCOUNTER — Other Ambulatory Visit: Payer: Self-pay | Admitting: Cardiology

## 2023-06-20 DIAGNOSIS — K080 Exfoliation of teeth due to systemic causes: Secondary | ICD-10-CM | POA: Diagnosis not present

## 2023-06-20 NOTE — Telephone Encounter (Signed)
BMD not received to date.

## 2023-06-24 ENCOUNTER — Telehealth: Payer: Self-pay | Admitting: "Endocrinology

## 2023-06-24 NOTE — Telephone Encounter (Signed)
Dr Fransico Him Please review referral from [place]. She sent a message on 1/9. She is asking to please take a look so that she can schedule pt if need be  Thanks

## 2023-06-25 ENCOUNTER — Ambulatory Visit: Payer: Medicare HMO | Admitting: Urology

## 2023-06-25 DIAGNOSIS — K080 Exfoliation of teeth due to systemic causes: Secondary | ICD-10-CM | POA: Diagnosis not present

## 2023-08-06 ENCOUNTER — Encounter: Payer: Self-pay | Admitting: Cardiology

## 2023-08-06 ENCOUNTER — Ambulatory Visit: Payer: Medicare HMO | Attending: Cardiology | Admitting: Cardiology

## 2023-08-06 VITALS — BP 108/78 | HR 52 | Ht 64.0 in | Wt 150.0 lb

## 2023-08-06 DIAGNOSIS — R0609 Other forms of dyspnea: Secondary | ICD-10-CM

## 2023-08-06 DIAGNOSIS — R002 Palpitations: Secondary | ICD-10-CM | POA: Diagnosis not present

## 2023-08-06 DIAGNOSIS — I951 Orthostatic hypotension: Secondary | ICD-10-CM | POA: Diagnosis not present

## 2023-08-06 NOTE — Patient Instructions (Signed)
 Medication Instructions:  Your physician recommends that you continue on your current medications as directed. Please refer to the Current Medication list given to you today.   Labwork: None today  Testing/Procedures: None today  Follow-Up: 6 months  Any Other Special Instructions Will Be Listed Below (If Applicable).  If you need a refill on your cardiac medications before your next appointment, please call your pharmacy.

## 2023-08-06 NOTE — Progress Notes (Signed)
    Cardiology Office Note  Date: 08/06/2023   ID: Debra Richards, DOB [birth year], MRN [id number]  History of Present Illness: Debra Richards is a 89 y.o. female last seen in September 2024.  She is here today with her daughter for a follow-up visit.  Doing relatively well, no recurring spells of dizziness and no falls.  She has tolerated the increase in midodrine to 5 mg twice daily very well.  We went over her medications.  She also remains on low-dose aspirin.  Physical Exam: VS:  BP 108/78   Pulse (!) 52   Ht 5\' 4"  (1.626 m)   Wt 150 lb (68 kg)   SpO2 94%   BMI 25.75 kg/m , BMI Body mass index is 25.75 kg/m.  Wt Readings from Last 3 Encounters:  08/06/23 150 lb (68 kg)  03/20/23 149 lb (67.6 kg)  01/29/23 149 lb (67.6 kg)    General: Patient appears comfortable at rest. HEENT: Conjunctiva and lids normal. Neck: Supple, no elevated JVP or carotid bruits. Lungs: Clear to auscultation, nonlabored breathing at rest. Cardiac: Regular rate and rhythm, no S3, 1/6 systolic murmur. Extremities: No pitting edema.  ECG:  An ECG dated 12/24/2022 was personally reviewed today and demonstrated:  Sinus rhythm with left bundle branch block.  Labwork: 12/24/2022: ALT 16; AST 18; BUN 14; Creatinine, Ser 0.74; Hemoglobin 14.0; Platelets 137; Potassium 4.1; Sodium 135   Other Studies Reviewed Today:  No interval cardiac testing for review today.  Assessment and Plan:  1.  History of palpitations with previously documented rare atrial and ventricular ectopy.  No progressive symptoms.   2.  Orthostatic hypotension.  Well-controlled at this time on midodrine 5 mg twice daily, no changes were made.   3.  Chronic dyspnea on exertion.  Echocardiography from June 2022 revealed LVEF 60 to 65%, normal RV contraction and estimated RVSP, no major valvular abnormalities.  Ischemic testing from November 2020 showed potential mild ischemic territory in the mid to basal inferoseptum versus variable  soft tissue attenuation, overall low risk.  She does not describe any recurrent exertional chest discomfort.  She remains on aspirin 81 mg daily.  Disposition:  Follow up  6 months.  Signed, Jonelle Sidle, M.D., F.A.C.C. [facility]

## 2023-08-12 DIAGNOSIS — M79674 Pain in right toe(s): Secondary | ICD-10-CM | POA: Diagnosis not present

## 2023-08-12 DIAGNOSIS — M79671 Pain in right foot: Secondary | ICD-10-CM | POA: Diagnosis not present

## 2023-08-12 DIAGNOSIS — L110 Acquired keratosis follicularis: Secondary | ICD-10-CM | POA: Diagnosis not present

## 2023-08-12 DIAGNOSIS — M2041 Other hammer toe(s) (acquired), right foot: Secondary | ICD-10-CM | POA: Diagnosis not present

## 2023-08-22 ENCOUNTER — Other Ambulatory Visit: Payer: Self-pay

## 2023-08-22 ENCOUNTER — Encounter: Payer: Self-pay | Admitting: "Endocrinology

## 2023-08-22 ENCOUNTER — Ambulatory Visit: Payer: Medicare HMO | Admitting: "Endocrinology

## 2023-08-22 VITALS — BP 110/76 | HR 68 | Ht 64.0 in | Wt 150.0 lb

## 2023-08-22 DIAGNOSIS — M810 Age-related osteoporosis without current pathological fracture: Secondary | ICD-10-CM

## 2023-08-22 MED ORDER — PROLIA 60 MG/ML ~~LOC~~ SOSY: 60.0000 mg | PREFILLED_SYRINGE | SUBCUTANEOUS | 1 refills | Status: DC

## 2023-08-22 NOTE — Progress Notes (Signed)
 Endocrinology Consult Note                                            08/22/2023, 7:43 PM   Subjective:    Patient ID: Debra Richards, female    DOB: 1926-05-24, PCP Ignatius Specking, MD   Past Medical History:  Diagnosis Date   Arthritis    Complication of anesthesia    Trouble waking up   GERD (gastroesophageal reflux disease)    History of kidney stones    Hypothyroidism    LBBB (left bundle branch block)    MVP (mitral valve prolapse)    Osteoporosis    Palpitations    RA (rheumatoid arthritis) (HCC)    Right ureteral stone    Rosacea    Past Surgical History:  Procedure Laterality Date   BACK SURGERY     CATARACT EXTRACTION W/ INTRAOCULAR LENS  IMPLANT, BILATERAL  4 yrs ago   both eyes   CHOLECYSTECTOMY  2005   EXTRACORPOREAL SHOCK WAVE LITHOTRIPSY  09-24-10   and 1990   Femur fx     HIP ARTHROPLASTY Right 10/12/2012   Procedure: ARTHROPLASTY BIPOLAR HIP;  Surgeon: Loanne Drilling, MD;  Location: [facility];  Service: Orthopedics;  Laterality: Right;   KNEE ARTHROSCOPY  04/17/2011, right   Procedure: ARTHROSCOPY KNEE;  Surgeon: Loanne Drilling;  Location: [facility];  Service: Orthopedics;  Laterality: Right;  WITH LATERAL MENISCAL DEBRIDEMENT   LUMBAR LAMINECTOMY/DECOMPRESSION MICRODISCECTOMY Right 12/28/2019   Procedure: Right Lumbar Four-Lumbar Five Microdiscectomy;  Surgeon: Maeola Harman, MD;  Location: [facility] OR;  Service: Neurosurgery;  Laterality: Right;  Right Lumbar Four-Lumbar Five Microdiscectomy   MOLES EXCISED  2012   on nose   Sternum Fx     TOTAL KNEE ARTHROPLASTY  03/23/2012   Procedure: TOTAL KNEE ARTHROPLASTY;  Surgeon: Loanne Drilling, MD;  Location: [facility];  Service: Orthopedics;  Laterality: Right;   VAGINAL HYSTERECTOMY  1970   Leiomyomata   Social History   Socioeconomic History   Marital status: Widowed    Spouse name: Not on file   Number of children: Not on file   Years of education: Not on file   Highest education  level: Not on file  Occupational History   Not on file  Tobacco Use   Smoking status: Never   Smokeless tobacco: Never  Vaping Use   Vaping status: Never Used  Substance and Sexual Activity   Alcohol use: No    Alcohol/week: 0.0 standard drinks of alcohol   Drug use: No   Sexual activity: Not Currently    Partners: Male    Birth control/protection: Post-menopausal, Surgical    Comment: 1st intercourse 55 yo-2 partners, hysterectomy  Other Topics Concern   Not on file  Social History Narrative   Lives in [place].   Lives c 2nd husband    NOK-Gina Axson   Social Drivers of Corporate investment banker Strain: Not on file  Food Insecurity: Not on file  Transportation Needs: Not on file  Physical Activity: Not on file  Stress: Not on file  Social Connections: Not on file   Family History  Problem Relation Age of Onset   Cancer Mother        COLON   Heart disease Father        STROKE  Stroke Father    Ovarian cancer Maternal Grandmother    Crohn's disease Daughter    Outpatient Encounter Medications as of 08/22/2023  Medication Sig   VITAMIN E PO Take 1 tablet by mouth daily.   aspirin EC 81 MG tablet Take 81 mg by mouth daily. Swallow whole.   B Complex Vitamins (B-COMPLEX/B-12 PO) Take by mouth. occasionally   cholecalciferol (VITAMIN D) 1000 units tablet Take 1,000 Units by mouth daily.    meclizine (ANTIVERT) 25 MG tablet Take 1 tablet (25 mg total) by mouth 2 (two) times daily as needed for dizziness.   midodrine (PROAMATINE) 5 MG tablet TAKE 1 TABLET BY MOUTH TWICE A DAY   ondansetron (ZOFRAN-ODT) 4 MG disintegrating tablet Take 1 tablet (4 mg total) by mouth every 6 (six) hours as needed for nausea or vomiting.   predniSONE (DELTASONE) 10 MG tablet Take 10 mg by mouth 2 (two) times daily as needed.   Zinc 50 MG CAPS Take 1 capsule by mouth daily.   No facility-administered encounter medications on file as of 08/22/2023.   ALLERGIES: Allergies  Allergen Reactions    Penicillins Hives    03/04/13: Pt has tolerated Amoxicillin/Unsyn without reaction   Contrast Media [Iodinated Contrast Media] Other (See Comments)    Arm swelled up and was painful after IVP years ago/    Levaquin [Levofloxacin In D5w] Other (See Comments)    Made her sick all over    VACCINATION STATUS: Immunization History  Administered Date(s) Administered   Influenza,inj,Quad PF,6+ Mos 03/01/2013    HPI Debra Richards is 89 y.o. female who presents today with a medical history as above. she is being seen in consultation for osteoporosis requested by Ignatius Specking, MD.  Patient is accompanied by her grown daughter to clinic.  History is obtained from the family as well as chart review.  Reportedly, she was diagnosed with osteoporosis more than 20 years ago.  She has taken various forms of antidepressant medications until she was switched to Prolia 5 years ago.  After taking it for 4 years, it was interrupted for unclear reasons.  Her last treatment was summer 2024.  She reports various forms of fractures including hip fracture over the years. She did not remember any side effects from Prolia.  She does not have any planned dental work.  She does not have acute complaints today. She is active and ambulatory at baseline.  She reports various and diffuse large joint arthritic pains. She is also on intermittent prednisone orally for pain control. She also has some disequilibrium with intermittent drop in her blood pressure currently on midodrine 5 mg twice daily. She does not have recent labs.  She also did not have recent bone density, last bone density seems to be in from 2022.  Review of Systems  Constitutional: +mildly fluctuating body weight , no fatigue, no subjective hyperthermia, no subjective hypothermia Eyes: no blurry vision, no xerophthalmia ENT: no sore throat, no nodules palpated in throat, no dysphagia/odynophagia, no hoarseness Cardiovascular: no Chest Pain, no  Shortness of Breath, no palpitations, no leg swelling Respiratory: no cough, no shortness of breath Gastrointestinal: no Nausea/Vomiting/Diarhhea Musculoskeletal: no muscle/joint aches Skin: no rashes Neurological: no tremors, no numbness, no tingling, no dizziness Psychiatric: no depression, no anxiety  Objective:       08/22/2023    9:09 AM 08/06/2023   10:24 AM 03/20/2023    9:54 AM  Vitals with BMI  Height 5\' 4"  5\' 4"  5\' 4"   Weight  150 lbs 150 lbs 149 lbs  BMI 25.73 25.73 25.56  Systolic 110 108 161  Diastolic 76 78 58  Pulse 68 52 59    BP 110/76   Pulse 68   Ht 5\' 4"  (1.626 m)   Wt 150 lb (68 kg)   BMI 25.75 kg/m   Wt Readings from Last 3 Encounters:  08/22/23 150 lb (68 kg)  08/06/23 150 lb (68 kg)  03/20/23 149 lb (67.6 kg)    Physical Exam  Constitutional:  Body mass index is 25.75 kg/m.,  not in acute distress, normal state of mind Eyes: PERRLA, EOMI, no exophthalmos ENT: moist mucous membranes, no gross thyromegaly, no gross cervical lymphadenopathy Cardiovascular: normal precordial activity, Regular Rate and Rhythm, no Murmur/Rubs/Gallops Respiratory:  adequate breathing efforts, no gross chest deformity, Clear to auscultation bilaterally Gastrointestinal: abdomen soft, Non -tender, No distension, Bowel Sounds present, no gross organomegaly Musculoskeletal: no gross deformities, strength intact in all four extremities, no peripheral edema Skin: moist, warm, no rashes Neurological: no tremor with outstretched hands, Deep tendon reflexes normal in bilateral lower extremities.  CMP ( most recent) CMP     Component Value Date/Time   NA 135 12/24/2022 1645   NA 142 02/17/2018 1549   K 4.1 12/24/2022 1645   CL 106 12/24/2022 1645   CO2 24 12/24/2022 1645   GLUCOSE 99 12/24/2022 1645   BUN 14 12/24/2022 1645   BUN 15 02/17/2018 1549   CREATININE 0.74 12/24/2022 1645   CREATININE 0.83 04/05/2014 1147   CALCIUM 8.4 (L) 12/24/2022 1645   CALCIUM 9.6  01/22/2012 1411   PROT 6.3 (L) 12/24/2022 1645   PROT 6.3 02/17/2018 1549   ALBUMIN 3.5 12/24/2022 1645   ALBUMIN 4.4 02/17/2018 1549   AST 18 12/24/2022 1645   ALT 16 12/24/2022 1645   ALKPHOS 59 12/24/2022 1645   BILITOT 0.8 12/24/2022 1645   BILITOT 0.4 02/17/2018 1549   GFRNONAA >60 12/24/2022 1645    Lab Results  Component Value Date   TSH 6.103 (H) 12/25/2020   TSH 6.797 (H) 09/15/2020   TSH 2.180 02/17/2018   TSH 2.360 03/06/2016   TSH 2.900 01/12/2015   FREET4 1.19 (H) 12/25/2020     Assessment & Plan:   1. Age-related osteoporosis without current pathological fracture (Primary)  - Kymari Lollis  is being seen at a kind request of Vyas, Dhruv B, MD. - I have reviewed her available  records and clinically evaluated the patient. - Based on these reviews, she has osteoporosis treated with various locations over the years.  5 years ago, she was switched to Prolia subcutaneously.  After taking it for the first 4 years, she there was unexplained interruption.  Her last treatment was from the summer 2024.  Accordingly, she is an immediate candidate for resumption and continuation of Prolia which I discussed with the family and she is in agreement. This treatment will be initiated as soon as her prescription is procured and to continue every 6 months. I discussed fracture risk with this patient particularly from Prolia withdrawal. -She appears to be due for her next bone density which she will have before her next visit.  Next visit will also include the following labs: - Comprehensive metabolic panel with GFR - TSH - T4, free - VITAMIN D 25 Hydroxy (Vit-D Deficiency, Fractures) - T3, free - DG Bone Density -I discussed proper nutrition and fall precautions with the family.  According to patient's daughter, she refused to use any ambulatory support  including canes if that she has various kinds of canes at home.  - she is advised to maintain close follow up with Ignatius Specking, MD for primary care needs.   -Thank you for involving me in the care of this pleasant patient.  Time spent with the patient: 46  minutes, of which >50% was spent in  counseling her about her osteoporosis and the rest in obtaining information about her symptoms, reviewing her previous labs/studies ( including abstractions from other facilities),  evaluations, and treatments,  and developing a plan to confirm diagnosis and long term treatment based on the latest standards of care/guidelines; and documenting her care.  Debra Richards participated in the discussions, expressed understanding, and voiced agreement with the above plans.  All questions were answered to her satisfaction. she is encouraged to contact clinic should she have any questions or concerns prior to her return visit.  Follow up plan: Return in about 6 months (around 02/22/2024) for Prolia Today (ASAP) and Prolia NV.   Marquis Lunch, MD [facility] [address] [place], [place] [address] Phone: [phone]  Fax: [phone]     08/22/2023, 7:43 PM  This note was partially dictated with voice recognition software. Similar sounding words can be transcribed inadequately or may not  be corrected upon review.

## 2023-08-27 ENCOUNTER — Other Ambulatory Visit (HOSPITAL_COMMUNITY): Payer: Self-pay

## 2023-08-27 ENCOUNTER — Telehealth: Payer: Self-pay | Admitting: "Endocrinology

## 2023-08-27 ENCOUNTER — Telehealth: Payer: Self-pay

## 2023-08-27 NOTE — Telephone Encounter (Signed)
 Pharmacy Patient Advocate Encounter   Received notification from Pt Calls Messages that prior authorization for Prolia is required/requested.   Insurance verification completed.   The patient is insured through [facility] .   Per test claim: PA required; However, NEW/RECENT labs/notes are needed to complete & submit PA request. Please see below.  Per most recent chart note:  "She does not have recent labs. She also did not have recent bone density, last bone density seems to be in from 2022."

## 2023-08-27 NOTE — Telephone Encounter (Signed)
 Pt called and stated that pt needs a PA on her Proila.

## 2023-09-01 ENCOUNTER — Other Ambulatory Visit: Payer: Self-pay | Admitting: "Endocrinology

## 2023-09-01 DIAGNOSIS — M810 Age-related osteoporosis without current pathological fracture: Secondary | ICD-10-CM

## 2023-09-01 NOTE — Telephone Encounter (Signed)
 Left a message for pt's daughter to call the office so we can direct her to have labs drawn. I let you know as soon as she does her labs.

## 2023-09-04 NOTE — Telephone Encounter (Signed)
 Spoke with pt's daughter Almira Coaster) making her aware pt needs labs drawn so we can send results to insurance company for prior auth. Understanding voiced.

## 2023-09-04 NOTE — Telephone Encounter (Signed)
 Debra Richards is asking for a call back - [phone]

## 2023-09-08 DIAGNOSIS — M810 Age-related osteoporosis without current pathological fracture: Secondary | ICD-10-CM | POA: Diagnosis not present

## 2023-09-09 LAB — T4, FREE: Free T4: 0.81 ng/dL — ABNORMAL LOW (ref 0.82–1.77)

## 2023-09-09 LAB — COMPREHENSIVE METABOLIC PANEL WITH GFR
ALT: 11 IU/L (ref 0–32)
AST: 22 IU/L (ref 0–40)
Albumin: 4 g/dL (ref 3.6–4.6)
Alkaline Phosphatase: 65 IU/L (ref 44–121)
BUN/Creatinine Ratio: 17 (ref 12–28)
BUN: 17 mg/dL (ref 10–36)
Bilirubin Total: 0.4 mg/dL (ref 0.0–1.2)
CO2: 25 mmol/L (ref 20–29)
Calcium: 9.6 mg/dL (ref 8.7–10.3)
Chloride: 106 mmol/L (ref 96–106)
Creatinine, Ser: 0.99 mg/dL (ref 0.57–1.00)
Globulin, Total: 1.8 g/dL (ref 1.5–4.5)
Glucose: 84 mg/dL (ref 70–99)
Potassium: 4.4 mmol/L (ref 3.5–5.2)
Sodium: 143 mmol/L (ref 134–144)
Total Protein: 5.8 g/dL — ABNORMAL LOW (ref 6.0–8.5)
eGFR: 53 mL/min/{1.73_m2} — ABNORMAL LOW (ref >59)

## 2023-09-09 LAB — VITAMIN D 25 HYDROXY (VIT D DEFICIENCY, FRACTURES): Vit D, 25-Hydroxy: 30.3 ng/mL (ref 30.0–100.0)

## 2023-09-09 LAB — TSH: TSH: 20.5 u[IU]/mL — ABNORMAL HIGH (ref 0.450–4.500)

## 2023-09-09 NOTE — Telephone Encounter (Signed)
 Labs have been done.

## 2023-09-09 NOTE — Telephone Encounter (Signed)
 Pt has had lab work performed, results are back. Will you add these to her information for prior authorization to see if we can get her Prolia finally approved.

## 2023-09-11 ENCOUNTER — Telehealth: Payer: Self-pay

## 2023-09-11 NOTE — Telephone Encounter (Signed)
 Pharmacy Patient Advocate Encounter   Received notification from Pt Calls Messages that prior authorization for Prolia is required/requested.   Insurance verification completed.   The patient is insured through [facility] .   Per test claim: PA required; PA started via CoverMyMeds. KEY [id number] . Please see clinical question(s) below that I am not finding the answer to in her chart and advise.     It looks like if it will be administered by the office, it needs to be billed to part B

## 2023-09-15 NOTE — Telephone Encounter (Signed)
 PA has been submitted. Key: [id number]

## 2023-09-15 NOTE — Telephone Encounter (Signed)
 Our office policy is for the pt to provide the Prolia  through her pharmacy benefits. We do not participate in the "buy-and-bill".

## 2023-09-18 ENCOUNTER — Ambulatory Visit: Payer: Medicare HMO | Admitting: Urology

## 2023-09-18 VITALS — BP 120/73 | HR 79

## 2023-09-18 DIAGNOSIS — N3941 Urge incontinence: Secondary | ICD-10-CM | POA: Diagnosis not present

## 2023-09-18 DIAGNOSIS — N3281 Overactive bladder: Secondary | ICD-10-CM | POA: Diagnosis not present

## 2023-09-18 DIAGNOSIS — Z87440 Personal history of urinary (tract) infections: Secondary | ICD-10-CM

## 2023-09-18 DIAGNOSIS — R3915 Urgency of urination: Secondary | ICD-10-CM

## 2023-09-18 DIAGNOSIS — R1031 Right lower quadrant pain: Secondary | ICD-10-CM

## 2023-09-18 DIAGNOSIS — N200 Calculus of kidney: Secondary | ICD-10-CM | POA: Diagnosis not present

## 2023-09-18 LAB — URINALYSIS, ROUTINE W REFLEX MICROSCOPIC
Bilirubin, UA: NEGATIVE
Glucose, UA: NEGATIVE
Ketones, UA: NEGATIVE
Nitrite, UA: NEGATIVE
Protein,UA: NEGATIVE
RBC, UA: NEGATIVE
Specific Gravity, UA: 1.01 (ref 1.005–1.030)
Urobilinogen, Ur: 1 mg/dL (ref 0.2–1.0)
pH, UA: 6 (ref 5.0–7.5)

## 2023-09-18 LAB — MICROSCOPIC EXAMINATION

## 2023-09-18 NOTE — Progress Notes (Unsigned)
 Subjective:  1. Urge incontinence   2. Renal stones   3. Urgency of urination   4. Personal history of urinary infection   5. Right lower quadrant abdominal pain      HPI: Debra Richards is a 89 year-old female established patient who is here for follow up of incontinence and stones.   09/18/23: Debra Richards returns in f/u for her history of UUI and UTI's.  Her UA has 0-5 WBC and a few bacteria.  She has persistent severe incontinence and has to use multiple pads.  She has been having a sveral day history right lower quadrant and flank pain that has been intermittent but not associated with nausea or hematuria.  Her last CT in 2022 showed bilateral renal stones.   04/03/23: Debra Richards returns today with the onset this morning of dysuria and gross hematuria.  She has persistent frequency and urgency with UUI.  She has had no fever.  Her UA looks infected.   03/20/23: Debra Richards returns today in f/u.  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  Her flowrate was reduced but she emptied well.  She has 6-10 WBC and 11-30 RBC's in the UA today but does have a history of stones with a right renal stone on her last imaging.  She has no flank pain or gross hematuria.   01/23/23: Debra Richards returns today in f/u. She continues to have marked incontinence.  She is using Mx pads and she leaks through the pads.   The Myrbetriq  was the last thing we tried and it didn't work.  She has had no flank pain or hematuria.  She had a normal CMP and CBC on 12/24/22.   08/29/22: Debra Richards returns today in f/u.  She wasn't able to do the PTNS because of transportation issues.  She is no longer on any meds because of side effects.  She has had no stone symptoms.    03/21/22: Debra Richards returns today in f/u.   She continues to have daytime frequency and nocturia and she leaks at night.  She has some dysuria.  She has had no hematuria or dysuria.     09/20/21: Debra Richards returns  today in f/u for her history of incontinence and prior stones.  Her UA is clear.  She has frequency and nocturia q1-2 hrs.  She continues to wear pads. She was given Gemtesa  and had some bloating so she stopped that.  She has had no hematuria or flank pain.Aaron Aas Her Cr was 0.99 on 09/08/23.   04/26/21: Debra Richards returns today today in f/u.  She was taken off of the Myrbetriq  because of issues with orthostasis requiring hospitalization.   She has nocturia q1-2 hr and frequency.  She wears depends at night at pads during the day.  She has a history of stones but no flank pain or hematuria.  A CT in 8/22 showed stable bilateral renal stones with no change since 2017.    10/26/20: Debra Richards was given Myrbetriq  50mg  at her last visit and she is doing well with reduced incontinence.  She has cut it to 25mg  because of expense.  She held it and her symptoms recurred.   She didn't get the KUB that was ordered for prior to this visit.   She had a CT Chest in 4/22 that showed bilateral renal stones without obstruction.   A Lumbar MRI in 1/22 also showed the stone.  She has had no hematuria.  She has some right sided pain but she has a history of lumbar spine disease and had surgery in 8/21.    GU Hx: Debra Richards returns today with the complaint of progressive incontinence over the last 3 years but it has been present for a decade. She has tried Sanctura and Enablex without success. She remains Myrbetriq  50mg  which helps but she still has nocturia x 4-5x.  She has urgency with UUI.  She has had no flank pain or hematuria and her UA is ok today.    She has had PTNS and Botox discussed but hasn't done either. She has marked frequency and urgency and will have UUI. She doesn't have SUI. She has nocturia with enuresis. She has no dysuria or hematuria. She has a history of stones with occasional mild right flank pain. She last passed a stone 4+ years ago. She has DDD with chronic back pain and is recovering from recent surgery by Dr. Nigel Bart.  Her UA is ok.        ROS:  ROS:  A complete review of systems was performed.  All systems are negative except for pertinent findings as noted.   Review of Systems  Respiratory:  Positive for shortness of breath.   Musculoskeletal:  Positive for joint pain.  All other systems reviewed and are negative.   Allergies  Allergen Reactions   Penicillins Hives    03/04/13: Pt has tolerated Amoxicillin Orysia Blas without reaction   Contrast Media [Iodinated Contrast Media] Other (See Comments)    Arm swelled up and was painful after IVP years ago/    Levaquin  [Levofloxacin  In D5w] Other (See Comments)    Made her sick all over    Outpatient Encounter Medications as of 09/18/2023  Medication Sig   aspirin  EC 81 MG tablet Take 81 mg by mouth daily. Swallow whole.   B Complex Vitamins (B-COMPLEX/B-12 PO) Take by mouth. occasionally   cholecalciferol  (VITAMIN D ) 1000 units tablet Take 1,000 Units by mouth daily.    denosumab  (PROLIA ) 60 MG/ML SOSY injection Inject 60 mg into the skin every 6 (six) months.   meclizine  (ANTIVERT ) 25 MG tablet Take 1 tablet (25 mg total) by mouth 2 (two) times daily as needed for dizziness.   midodrine  (PROAMATINE ) 5 MG tablet TAKE 1 TABLET BY MOUTH TWICE A DAY   ondansetron  (ZOFRAN -ODT) 4 MG disintegrating tablet Take 1 tablet (4 mg total) by mouth every 6 (six) hours as needed for nausea or vomiting.   predniSONE  (DELTASONE ) 10 MG tablet Take 10 mg by mouth 2 (two) times daily as needed.   VITAMIN E  PO Take 1 tablet by mouth daily.   Zinc  50 MG CAPS Take 1 capsule by mouth daily.   No facility-administered encounter medications on file as of 09/18/2023.    Past Medical History:  Diagnosis Date   Arthritis    Complication of anesthesia    Trouble waking up   GERD (gastroesophageal reflux disease)    History of kidney stones    Hypothyroidism    LBBB (left bundle branch block)    MVP (mitral valve prolapse)    Osteoporosis    Palpitations    RA  (rheumatoid arthritis) (HCC)    Right ureteral stone    Rosacea     Past Surgical History:  Procedure Laterality Date   BACK SURGERY     CATARACT EXTRACTION W/ INTRAOCULAR LENS  IMPLANT, BILATERAL  4 yrs ago   both eyes   CHOLECYSTECTOMY  2005   EXTRACORPOREAL  SHOCK WAVE LITHOTRIPSY  09-24-10   and 1990   Femur fx     HIP ARTHROPLASTY Right 10/12/2012   Procedure: ARTHROPLASTY BIPOLAR HIP;  Surgeon: Aurther Blue, MD;  Location: [facility];  Service: Orthopedics;  Laterality: Right;   KNEE ARTHROSCOPY  04/17/2011, right   Procedure: ARTHROSCOPY KNEE;  Surgeon: Aurther Blue;  Location: [facility];  Service: Orthopedics;  Laterality: Right;  WITH LATERAL MENISCAL DEBRIDEMENT   LUMBAR LAMINECTOMY/DECOMPRESSION MICRODISCECTOMY Right 12/28/2019   Procedure: Right Lumbar Four-Lumbar Five Microdiscectomy;  Surgeon: Manya Sells, MD;  Location: [facility];  Service: Neurosurgery;  Laterality: Right;  Right Lumbar Four-Lumbar Five Microdiscectomy   MOLES EXCISED  2012   on nose   Sternum Fx     TOTAL KNEE ARTHROPLASTY  03/23/2012   Procedure: TOTAL KNEE ARTHROPLASTY;  Surgeon: Aurther Blue, MD;  Location: [facility];  Service: Orthopedics;  Laterality: Right;   VAGINAL HYSTERECTOMY  1970   Leiomyomata    Social History   Socioeconomic History   Marital status: Widowed    Spouse name: Not on file   Number of children: Not on file   Years of education: Not on file   Highest education level: Not on file  Occupational History   Not on file  Tobacco Use   Smoking status: Never   Smokeless tobacco: Never  Vaping Use   Vaping status: Never Used  Substance and Sexual Activity   Alcohol  use: No    Alcohol /week: 0.0 standard drinks of alcohol    Drug use: No   Sexual activity: Not Currently    Partners: Male    Birth control/protection: Post-menopausal, Surgical    Comment: 1st intercourse 56 yo-2 partners, hysterectomy  Other Topics Concern   Not on file  Social History  Narrative   Lives in [place].   Lives c 2nd husband    NOK-Gina Axson   Social Drivers of Corporate investment banker Strain: Not on file  Food Insecurity: Not on file  Transportation Needs: Not on file  Physical Activity: Not on file  Stress: Not on file  Social Connections: Not on file  Intimate Partner Violence: Not on file    Family History  Problem Relation Age of Onset   Cancer Mother        COLON   Heart disease Father        STROKE   Stroke Father    Ovarian cancer Maternal Grandmother    Crohn's disease Daughter        Objective: Vitals:   09/18/23 1108  BP: 120/73  Pulse: 79      Physical Exam Vitals reviewed.  Constitutional:      Appearance: Normal appearance.  Abdominal:     Tenderness: There is abdominal tenderness (right lower quadrant pain.).  Neurological:     Mental Status: She is alert.     Lab Results:  Results for orders placed or performed in visit on 09/18/23 (from the past 24 hours)  Urinalysis, Routine w reflex microscopic     Status: Abnormal   Collection Time: 09/18/23 11:35 AM  Result Value Ref Range   Specific Gravity, UA 1.010 1.005 - 1.030   pH, UA 6.0 5.0 - 7.5   Color, UA Yellow Yellow   Appearance Ur Clear Clear   Leukocytes,UA Trace (A) Negative   Protein,UA Negative Negative/Trace   Glucose, UA Negative Negative   Ketones, UA Negative Negative   RBC, UA Negative Negative   Bilirubin, UA  Negative Negative   Urobilinogen, Ur 1.0 0.2 - 1.0 mg/dL   Nitrite, UA Negative Negative   Microscopic Examination See below:    Narrative   Performed at:  [address] - [facility] [address], [place], [place]  [id number] Lab Director: Liliana Regulus MT, Phone:  [phone]  Microscopic Examination     Status: None   Collection Time: 09/18/23 11:35 AM   Urine  Result Value Ref Range   WBC, UA 0-5 0 - 5 /hpf   Epithelial Cells (non renal) CANCELED    Bacteria, UA Few None seen/Few   Narrative   Performed at:  [address] -  [facility] [address], [place], [place]  [id number] Lab Director: Liliana Regulus MT, Phone:  [phone]        UA has 0-5 WBC and few bacteria.         Studies/Results:  No results found.  CT from 2022 reviewed.   Assessment & Plan: History of UTI's.   UA is ok today.    OAB wet.   She has a small capacity bladder with DI and UUI on urodynamics and primarily has problems at night.  She decided against Botox and has failed meds and PTNS .   Kidney stones.  She has RLQ and right flank pain.  I will get a stone.     No orders of the defined types were placed in this encounter.     Orders Placed This Encounter  Procedures   Microscopic Examination   CT RENAL STONE STUDY    Preferred imaging location?:   [facility]    Radiology Contrast Protocol - do NOT remove file path:   [email or web address]   Urinalysis, Routine w reflex microscopic      Return in about 3 months (around 12/18/2023) for with available provider. .   CC: Orlena Bitters, MD      Homero Luster 09/19/2023 Patient ID: Debra Richards, female   DOB: Apr 11, 1926, 89 y.o.   MRN: [id number]

## 2023-09-18 NOTE — Telephone Encounter (Signed)
Left a message requesting pt's daughter to return call to the office. 

## 2023-09-18 NOTE — Telephone Encounter (Signed)
 Any update?

## 2023-09-19 ENCOUNTER — Encounter: Payer: Self-pay | Admitting: Urology

## 2023-09-22 NOTE — Telephone Encounter (Signed)
Left a message requesting pt's daughter to return call to the office. 

## 2023-09-23 DIAGNOSIS — I739 Peripheral vascular disease, unspecified: Secondary | ICD-10-CM | POA: Diagnosis not present

## 2023-09-23 DIAGNOSIS — M79671 Pain in right foot: Secondary | ICD-10-CM | POA: Diagnosis not present

## 2023-09-23 DIAGNOSIS — M79672 Pain in left foot: Secondary | ICD-10-CM | POA: Diagnosis not present

## 2023-09-23 DIAGNOSIS — L110 Acquired keratosis follicularis: Secondary | ICD-10-CM | POA: Diagnosis not present

## 2023-09-23 NOTE — Telephone Encounter (Signed)
Left a message requesting pt's daughter to return call to the office. 

## 2023-09-24 ENCOUNTER — Ambulatory Visit (HOSPITAL_COMMUNITY)

## 2023-10-02 ENCOUNTER — Ambulatory Visit (HOSPITAL_COMMUNITY)
Admission: RE | Admit: 2023-10-02 | Discharge: 2023-10-02 | Disposition: A | Discharge status: Home / Self Care | Source: Ambulatory Visit | Attending: Urology | Admitting: Urology

## 2023-10-02 DIAGNOSIS — R1031 Right lower quadrant pain: Secondary | ICD-10-CM | POA: Diagnosis present

## 2023-10-03 ENCOUNTER — Telehealth: Payer: Self-pay

## 2023-10-03 NOTE — Telephone Encounter (Signed)
-----   Message from Homero Luster sent at 10/03/2023  8:20 AM EDT ----- She has stable kidney stone but no obstructing stones.  She has no other acute findings.  F/u as planned. ----- Message ----- From: Dyke Glasser, CMA Sent: 10/02/2023   3:35 PM EDT To: Homero Luster, MD  Please review.

## 2023-10-03 NOTE — Telephone Encounter (Signed)
 Called to relay message per MD Inga Manges pt stated she will be at her f/u appt

## 2023-10-06 NOTE — Telephone Encounter (Signed)
 Is there any update on prior auth appeal for Prolia ?

## 2023-10-08 ENCOUNTER — Telehealth: Payer: Self-pay | Admitting: Pharmacist

## 2023-10-08 ENCOUNTER — Other Ambulatory Visit (HOSPITAL_COMMUNITY): Payer: Self-pay

## 2023-10-08 NOTE — Telephone Encounter (Signed)
 Appeal has been submitted for Prolia . Will advise when response is received, please be advised that most companies may take up to 30 days to make a decision. Appeal letter and supporting documentation have been faxed to [phone] on 10/08/2023 @12 :05pm.  Thank you, Dene Fines, PharmD Clinical Pharmacist  [place]  Direct Dial: [phone]

## 2023-10-08 NOTE — Telephone Encounter (Signed)
Left a message requesting pt's daughter to return call to the office. 

## 2023-10-09 NOTE — Telephone Encounter (Signed)
Left a message requesting pt's daughter return call to the office. 

## 2023-10-13 ENCOUNTER — Ambulatory Visit (HOSPITAL_COMMUNITY)

## 2023-10-13 NOTE — Telephone Encounter (Signed)
 Spoke with pt's daughter making her aware the pharmacist with our Rx Prior Authorization Team has sent an appeal letter along with supporting documentation to [employer]. Advised her I would get in contact with her as soon as we have a response. Understanding voiced.

## 2023-10-14 ENCOUNTER — Telehealth: Payer: Self-pay | Admitting: Pharmacist

## 2023-10-14 NOTE — Telephone Encounter (Signed)
 Per [employer] the appeal for Prolia  has been approved from 09/12/2023 through 10/12/2024.  Approval letter has been uploaded under the Media tab.  Thank you, Dene Fines, PharmD Clinical Pharmacist  [place]  Direct Dial: [phone]

## 2023-10-15 ENCOUNTER — Other Ambulatory Visit (HOSPITAL_COMMUNITY): Payer: Self-pay

## 2023-10-15 NOTE — Telephone Encounter (Signed)
Pt's daughter made aware.

## 2023-10-21 ENCOUNTER — Ambulatory Visit: Admitting: "Endocrinology

## 2023-10-23 ENCOUNTER — Ambulatory Visit: Admitting: "Endocrinology

## 2023-10-23 ENCOUNTER — Telehealth: Payer: Self-pay | Admitting: "Endocrinology

## 2023-10-23 NOTE — Telephone Encounter (Signed)
 I called pt's daughter Bonnell Butcher and she said that she called [facility] and they canceled her shipment for the Proila because they never got an okay from her regarding the copay. She said that she was not advised on that. She said she called the insurance company about the copay and is waiting on a call back

## 2023-10-23 NOTE — Telephone Encounter (Signed)
 Noted

## 2023-11-03 ENCOUNTER — Ambulatory Visit (INDEPENDENT_AMBULATORY_CARE_PROVIDER_SITE_OTHER)

## 2023-11-03 VITALS — BP 110/62 | HR 44 | Ht 64.0 in | Wt 146.2 lb

## 2023-11-03 DIAGNOSIS — M810 Age-related osteoporosis without current pathological fracture: Secondary | ICD-10-CM | POA: Diagnosis not present

## 2023-11-03 MED ORDER — DENOSUMAB 60 MG/ML ~~LOC~~ SOSY
60.0000 mg | PREFILLED_SYRINGE | SUBCUTANEOUS | Status: DC
  Administered 2023-11-03: 60 mg via SUBCUTANEOUS

## 2023-11-03 NOTE — Progress Notes (Signed)
 Pt seen today for Nurses Visit to receive her Prolia  injection. Pt provided her Prolia  ( Lot # [id number], Exp. Date 01/24/2026). Discussed with pt possible side effects, answered all pt questions. Prolia  60mg  given SQ in upper L arm without difficulty. Pt waited 15 minutes post injection, no adverse reactions noted.

## 2023-12-05 ENCOUNTER — Other Ambulatory Visit: Payer: Self-pay | Admitting: "Endocrinology

## 2023-12-22 NOTE — Progress Notes (Signed)
 HPI: Debra Richards is a 89 year-old female established patient who is here for follow up of incontinence and stones.   7.29.2025 here today for routine check.  After her visit in April she had CT scan which showed bilateral renal calculi.  Dominant stone was right interpolar, 7 mm in size.  The stones were all calyceal in nature.  She has had no recent flank pain.  She still has daytime frequency and urgency, small amount of leakage during the day.  Worse urgency and urgency incontinence at night.  She has tried Myrbetriq  in the past without significant improvement.  09/18/23: Debra Richards returns in f/u for her history of UUI and UTI's.  Her UA has 0-5 WBC and a few bacteria.  She has persistent severe incontinence and has to use multiple pads.  She has been having a sveral day history right lower quadrant and flank pain that has been intermittent but not associated with nausea or hematuria.  Her last CT in 2022 showed bilateral renal stones.   04/03/23: Debra Richards returns today with the onset this morning of dysuria and gross hematuria.  She has persistent frequency and urgency with UUI.  She has had no fever.  Her UA looks infected.   03/20/23: Debra Richards returns today in f/u.  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  Her flowrate was reduced but she emptied well.  She has 6-10 WBC and 11-30 RBC's in the UA today but does have a history of stones with a right renal stone on her last imaging.  She has no flank pain or gross hematuria.   01/23/23: Debra Richards returns today in f/u. She continues to have marked incontinence.  She is using Mx pads and she leaks through the pads.   The Myrbetriq  was the last thing we tried and it didn't work.  She has had no flank pain or hematuria.  She had a normal CMP and CBC on 12/24/22.   08/29/22: Debra Richards returns today in f/u.  She wasn't able to do the PTNS because of transportation issues.  She is no longer on  any meds because of side effects.  She has had no stone symptoms.    03/21/22: Debra Richards returns today in f/u.   She continues to have daytime frequency and nocturia and she leaks at night.  She has some dysuria.  She has had no hematuria or dysuria.     09/20/21: Debra Richards returns today in f/u for her history of incontinence and prior stones.  Her UA is clear.  She has frequency and nocturia q1-2 hrs.  She continues to wear pads. She was given Gemtesa  and had some bloating so she stopped that.  She has had no hematuria or flank pain.SABRA Her Cr was 0.99 on 09/08/23.   04/26/21: Debra Richards returns today today in f/u.  She was taken off of the Myrbetriq  because of issues with orthostasis requiring hospitalization.   She has nocturia q1-2 hr and frequency.  She wears depends at night at pads during the day.  She has a history of stones but no flank pain or hematuria.  A CT in 8/22 showed stable bilateral renal stones with no change since 2017.    10/26/20: Debra Richards was given Myrbetriq  50mg  at her last visit and she is doing well with reduced incontinence.  She has cut it to 25mg  because of expense.  She held it and her symptoms recurred.   She  didn't get the KUB that was ordered for prior to this visit.   She had a CT Chest in 4/22 that showed bilateral renal stones without obstruction.   A Lumbar MRI in 1/22 also showed the stone.  She has had no hematuria.  She has some right sided pain but she has a history of lumbar spine disease and had surgery in 8/21.    GU Hx: Debra Richards returns today with the complaint of progressive incontinence over the last 3 years but it has been present for a decade. She has tried Sanctura and Enablex without success. She remains Myrbetriq  50mg  which helps but she still has nocturia x 4-5x.  She has urgency with UUI.  She has had no flank pain or hematuria and her UA is ok today.    She has had PTNS and Botox discussed but hasn't done either. She has marked frequency and urgency and will have  UUI. She doesn't have SUI. She has nocturia with enuresis. She has no dysuria or hematuria. She has a history of stones with occasional mild right flank pain. She last passed a stone 4+ years ago. She has DDD with chronic back pain and is recovering from recent surgery by Dr. Unice. Her UA is ok.        ROS:  ROS:  A complete review of systems was performed.  All systems are negative except for pertinent findings as noted.   Review of Systems  Respiratory:  Positive for shortness of breath.   Musculoskeletal:  Positive for joint pain.  All other systems reviewed and are negative.   Allergies  Allergen Reactions   Penicillins Hives    03/04/13: Pt has tolerated Amoxicillin Pascal without reaction   Contrast Media [Iodinated Contrast Media] Other (See Comments)    Arm swelled up and was painful after IVP years ago/    Levaquin  [Levofloxacin  In D5w] Other (See Comments)    Made her sick all over    Outpatient Encounter Medications as of 12/23/2023  Medication Sig   aspirin  EC 81 MG tablet Take 81 mg by mouth daily. Swallow whole.   B Complex Vitamins (B-COMPLEX/B-12 PO) Take by mouth. occasionally   cholecalciferol  (VITAMIN D ) 1000 units tablet Take 1,000 Units by mouth daily.    denosumab  (PROLIA ) 60 MG/ML SOSY injection Inject 60 mg into the skin every 6 (six) months.   meclizine  (ANTIVERT ) 25 MG tablet Take 1 tablet (25 mg total) by mouth 2 (two) times daily as needed for dizziness.   midodrine  (PROAMATINE ) 5 MG tablet TAKE 1 TABLET BY MOUTH TWICE A DAY   ondansetron  (ZOFRAN -ODT) 4 MG disintegrating tablet Take 1 tablet (4 mg total) by mouth every 6 (six) hours as needed for nausea or vomiting.   predniSONE  (DELTASONE ) 10 MG tablet Take 10 mg by mouth 2 (two) times daily as needed.   VITAMIN E  PO Take 1 tablet by mouth daily.   Zinc  50 MG CAPS Take 1 capsule by mouth daily.   Facility-Administered Encounter Medications as of 12/23/2023  Medication   denosumab  (PROLIA ) injection  60 mg    Past Medical History:  Diagnosis Date   Arthritis    Complication of anesthesia    Trouble waking up   GERD (gastroesophageal reflux disease)    History of kidney stones    Hypothyroidism    LBBB (left bundle branch block)    MVP (mitral valve prolapse)    Osteoporosis    Palpitations    RA (rheumatoid arthritis) (HCC)  Right ureteral stone    Rosacea     Past Surgical History:  Procedure Laterality Date   BACK SURGERY     CATARACT EXTRACTION W/ INTRAOCULAR LENS  IMPLANT, BILATERAL  4 yrs ago   both eyes   CHOLECYSTECTOMY  2005   EXTRACORPOREAL SHOCK WAVE LITHOTRIPSY  09-24-10   and 1990   Femur fx     HIP ARTHROPLASTY Right 10/12/2012   Procedure: ARTHROPLASTY BIPOLAR HIP;  Surgeon: Dempsey LULLA Moan, MD;  Location: [facility];  Service: Orthopedics;  Laterality: Right;   KNEE ARTHROSCOPY  04/17/2011, right   Procedure: ARTHROSCOPY KNEE;  Surgeon: Dempsey LULLA Moan;  Location: [facility];  Service: Orthopedics;  Laterality: Right;  WITH LATERAL MENISCAL DEBRIDEMENT   LUMBAR LAMINECTOMY/DECOMPRESSION MICRODISCECTOMY Right 12/28/2019   Procedure: Right Lumbar Four-Lumbar Five Microdiscectomy;  Surgeon: Unice Pac, MD;  [facility];  Service: Neurosurgery;  Laterality: Right;  Right Lumbar Four-Lumbar Five Microdiscectomy   MOLES EXCISED  2012   on nose   Sternum Fx     TOTAL KNEE ARTHROPLASTY  03/23/2012   Procedure: TOTAL KNEE ARTHROPLASTY;  Surgeon: Dempsey LULLA Moan, MD;  Location: [facility];  Service: Orthopedics;  Laterality: Right;   VAGINAL HYSTERECTOMY  1970   Leiomyomata    Social History   Socioeconomic History   Marital status: Widowed    Spouse name: Not on file   Number of children: Not on file   Years of education: Not on file   Highest education level: Not on file  Occupational History   Not on file  Tobacco Use   Smoking status: Never   Smokeless tobacco: Never  Vaping Use   Vaping status: Never Used  Substance and Sexual  Activity   Alcohol  use: No    Alcohol /week: 0.0 standard drinks of alcohol    Drug use: No   Sexual activity: Not Currently    Partners: Male    Birth control/protection: Post-menopausal, Surgical    Comment: 1st intercourse 32 yo-2 partners, hysterectomy  Other Topics Concern   Not on file  Social History Narrative   Lives in [place].   Lives c 2nd husband    NOK-Gina Axson   Social Drivers of Corporate investment banker Strain: Not on file  Food Insecurity: Not on file  Transportation Needs: Not on file  Physical Activity: Not on file  Stress: Not on file  Social Connections: Not on file  Intimate Partner Violence: Not on file    Family History  Problem Relation Age of Onset   Cancer Mother        COLON   Heart disease Father        STROKE   Stroke Father    Ovarian cancer Maternal Grandmother    Crohn's disease Daughter        Objective: There were no vitals filed for this visit.     Physical Exam Vitals reviewed.  Constitutional:      Appearance: Normal appearance.  Abdominal:     Tenderness: There is abdominal tenderness (right lower quadrant pain.).  Neurological:     Mental Status: She is alert.     Lab Results:  No results found for this or any previous visit (from the past 24 hours).       UA has 0-5 WBC and few bacteria.         Studies/Results:  CT RENAL STONE STUDY Result Date: 10/02/2023 CLINICAL DATA:  Abdominal/flank pain, stone suspected Right lower quadrant abdominal  pain. EXAM: CT ABDOMEN AND PELVIS WITHOUT CONTRAST TECHNIQUE: Multidetector CT imaging of the abdomen and pelvis was performed following the standard protocol without IV contrast. RADIATION DOSE REDUCTION: This exam was performed according to the departmental dose-optimization program which includes automated exposure control, adjustment of the mA and/or kV according to patient size and/or use of iterative reconstruction technique. COMPARISON:  Abdominopelvic CT  12/25/2020. FINDINGS: Lower chest: Unchanged 6 mm left lower lobe pulmonary nodule on image 18/4, considered benign based on stability. No confluent airspace disease or suspicious nodularity. There is no pleural effusion or pneumothorax. Aortic atherosclerosis and pectus deformity of the sternum noted. Hepatobiliary: The liver appears stable, without focal abnormality on noncontrast imaging. Status post cholecystectomy with stable mild intra and extrahepatic biliary dilatation, likely physiologic. Pancreas: Unremarkable. No pancreatic ductal dilatation or surrounding inflammatory changes. Spleen: Normal in size without focal abnormality. Adrenals/Urinary Tract: Both adrenal glands appear normal. Multiple nonobstructing renal calculi are again noted, measuring up to 7 mm in diameter in the upper pole of the right kidney. No evidence of ureteral calculus, hydronephrosis or perinephric soft tissue stranding. Multiple pelvic phleboliths are grossly stable. The distal ureters are partially obscured by artifact from the right hip arthroplasty. There are small renal cysts bilaterally for which no specific imaging follow-up is recommended. The bladder appears unremarkable for its degree of distention. Stomach/Bowel: No enteric contrast administered. The stomach appears unremarkable for its degree of distension. No evidence of bowel wall thickening, distention or surrounding inflammatory change. The appendix appears normal. Moderate stool throughout the colon. There are diverticular changes throughout the sigmoid colon. Vascular/Lymphatic: There are no enlarged abdominal or pelvic lymph nodes. Aortic and branch vessel atherosclerosis without evidence of aneurysm. Reproductive: Status post hysterectomy. Unchanged cystic appearing adnexal lesions bilaterally, measuring 2.3 cm on the right (image 63/2) and 3.0 cm on the left (image 64/2). These demonstrate no aggressive characteristics and are likely clinically insignificant  given the patient's age. Other: No evidence of abdominal wall mass or hernia. No ascites or pneumoperitoneum. Musculoskeletal: No acute or significant osseous findings. Previous right hip hemiarthroplasty. Multilevel spondylosis with chronic superior endplate compression deformity at L2. IMPRESSION: 1. No acute findings or explanation for the patient's symptoms. 2. Multiple nonobstructing renal calculi bilaterally. No evidence of ureteral calculus, hydronephrosis or perinephric soft tissue stranding. 3. Sigmoid diverticulosis without evidence of acute diverticulitis. 4. Stable cystic appearing adnexal lesions bilaterally, likely clinically insignificant given the patient's age. No specific follow-up imaging recommended. 5.  Aortic Atherosclerosis (ICD10-I70.0). Electronically Signed   By: Elsie Perone M.D.   On: 10/02/2023 12:49    Above CT images reviewed with patient  Urinalysis today clear  Prior urology notes reviewed  Assessment: - Bilateral renal calculi, currently asymptomatic  -Overactive bladder-wet.  Quite bothersome to the patient, especially at night   Plan: - For now, we will not proceed with further imaging of her kidneys as she is asymptomatic  -She would like to try samples of Gemtesa -I gave her 4 boxes, take 75 mg tablets 1 a day  -If improvement in symptoms, she will let us  know.  Otherwise, I will have her come back in 6 months

## 2023-12-23 ENCOUNTER — Ambulatory Visit: Admitting: Urology

## 2023-12-23 VITALS — BP 120/66 | HR 62

## 2023-12-23 DIAGNOSIS — N3281 Overactive bladder: Secondary | ICD-10-CM

## 2023-12-23 DIAGNOSIS — R3915 Urgency of urination: Secondary | ICD-10-CM

## 2023-12-23 DIAGNOSIS — N200 Calculus of kidney: Secondary | ICD-10-CM | POA: Diagnosis not present

## 2023-12-23 DIAGNOSIS — N3941 Urge incontinence: Secondary | ICD-10-CM

## 2023-12-23 LAB — URINALYSIS, ROUTINE W REFLEX MICROSCOPIC
Bilirubin, UA: NEGATIVE
Glucose, UA: NEGATIVE
Ketones, UA: NEGATIVE
Leukocytes,UA: NEGATIVE
Nitrite, UA: NEGATIVE
Specific Gravity, UA: 1.02 (ref 1.005–1.030)
Urobilinogen, Ur: 2 mg/dL — ABNORMAL HIGH (ref 0.2–1.0)
pH, UA: 6 (ref 5.0–7.5)

## 2023-12-23 LAB — MICROSCOPIC EXAMINATION: Epithelial Cells (non renal): >10 /HPF — ABNORMAL HIGH (ref 0–10)

## 2024-01-16 ENCOUNTER — Encounter: Payer: Self-pay | Admitting: Radiology

## 2024-02-23 ENCOUNTER — Ambulatory Visit: Admitting: "Endocrinology

## 2024-03-10 ENCOUNTER — Encounter: Payer: Self-pay | Admitting: Cardiology

## 2024-03-10 ENCOUNTER — Ambulatory Visit: Attending: Cardiology | Admitting: Cardiology

## 2024-03-10 VITALS — BP 118/64 | HR 53 | Ht 64.0 in | Wt 140.4 lb

## 2024-03-10 DIAGNOSIS — I447 Left bundle-branch block, unspecified: Secondary | ICD-10-CM

## 2024-03-10 DIAGNOSIS — R002 Palpitations: Secondary | ICD-10-CM

## 2024-03-10 DIAGNOSIS — I951 Orthostatic hypotension: Secondary | ICD-10-CM | POA: Diagnosis not present

## 2024-03-10 NOTE — Patient Instructions (Addendum)

## 2024-03-10 NOTE — Progress Notes (Signed)
    Cardiology Office Note  Date: 03/10/2024   ID: Debra Richards, DOB November 19, 1925, MRN [id number]  History of Present Illness: Debra Richards is a 89 y.o. female last seen in March.  She is here for a routine visit.  Reports an occasional sense of brief palpitations, no sudden dizziness or syncope.  Prior episodes of orthostasis have not been recurring on midodrine .  She remains functional with ADLs and still lives in her own home.  I reviewed her medications, remains on midodrine  5 mg twice daily.  She is following with Dr. Rosamond as before.  I went over her interval lab work.  I reviewed her ECG today which shows sinus rhythm with unifocal PVCs and left bundle branch block.  Physical Exam: VS:  BP 118/64   Pulse (!) 53   Ht 5' 4 (1.626 m)   Wt 140 lb 6.4 oz (63.7 kg)   SpO2 99%   BMI 24.10 kg/m , BMI Body mass index is 24.1 kg/m.  Wt Readings from Last 3 Encounters:  03/10/24 140 lb 6.4 oz (63.7 kg)  11/03/23 146 lb 3.2 oz (66.3 kg)  08/22/23 150 lb (68 kg)    General: Patient appears comfortable at rest. HEENT: Conjunctiva and lids normal. Neck: Supple, no elevated JVP or carotid bruits. Lungs: Clear to auscultation, nonlabored breathing at rest. Cardiac: Regular rate and rhythm, no S3, 1/6 systolic murmur.  ECG:  An ECG dated 12/24/2022 was personally reviewed today and demonstrated:  Sinus rhythm with left bundle branch block.  Labwork: 09/08/2023: ALT 11; AST 22; BUN 17; Creatinine, Ser 0.99; Potassium 4.4; Sodium 143; TSH 20.500  September 2025: Hemoglobin 14, platelets 216, BUN 19, creatinine 0.86, potassium 4.3, AST 19, ALT 13, cholesterol 198, triglycerides 97, HDL 56, LDL 125, TSH 19.1  Other Studies Reviewed Today:  No interval cardiac testing for review today.  Assessment and Plan:  1.  History of palpitations with previously documented rare atrial and ventricular ectopy.  No increasing pattern.  ECG reviewed.  Continue observation.   2.  Orthostatic  hypotension.  Well-controlled at this time on midodrine  5 mg twice daily, no changes were made.  Blood pressure is normal today.   3.  Chronic dyspnea on exertion.  Echocardiography from June 2022 revealed LVEF 60 to 65%, normal RV contraction and estimated RVSP, no major valvular abnormalities.  Ischemic testing from November 2020 showed potential mild ischemic territory in the mid to basal inferoseptum versus variable soft tissue attenuation, overall low risk.  She does not describe any recurrent exertional chest discomfort.  Following conservatively at this point.  She is on aspirin  81 mg daily.  Disposition:  Follow up 6 months.  Signed, Jayson JUDITHANN Sierras, M.D., F.A.C.C. [facility]

## 2024-03-27 LAB — LAB REPORT - SCANNED: EGFR: 55

## 2024-03-29 ENCOUNTER — Encounter: Payer: Self-pay | Admitting: Radiology

## 2024-04-15 ENCOUNTER — Telehealth: Payer: Self-pay | Admitting: "Endocrinology

## 2024-04-15 DIAGNOSIS — M810 Age-related osteoporosis without current pathological fracture: Secondary | ICD-10-CM

## 2024-04-15 NOTE — Telephone Encounter (Signed)
 Called to make sure Prolia  would be here for her appt on 05/04/24

## 2024-04-15 NOTE — Telephone Encounter (Signed)
Left a message requesting pt's daughter to return call to the office. 

## 2024-04-16 NOTE — Telephone Encounter (Signed)
Left a message requesting pt's daughter return call to the office. 

## 2024-04-19 NOTE — Telephone Encounter (Signed)
 Patient's daughter called back and made her aware to call over to [facility] to get them to send that to us 

## 2024-04-21 NOTE — Telephone Encounter (Signed)
 [facility] called back and said that it is not covered and Jubbonti is covered and preferred on formulary. Please Advise

## 2024-04-26 ENCOUNTER — Other Ambulatory Visit (HOSPITAL_COMMUNITY): Payer: Self-pay

## 2024-04-26 ENCOUNTER — Telehealth: Payer: Self-pay

## 2024-04-26 ENCOUNTER — Other Ambulatory Visit: Payer: Self-pay

## 2024-04-26 DIAGNOSIS — M810 Age-related osteoporosis without current pathological fracture: Secondary | ICD-10-CM

## 2024-04-26 MED ORDER — JUBBONTI 60 MG/ML ~~LOC~~ SOSY: 60.0000 mg | PREFILLED_SYRINGE | SUBCUTANEOUS | 0 refills | Status: AC

## 2024-04-26 NOTE — Telephone Encounter (Signed)
 Pharmacy Patient Advocate Encounter   Received notification from Pt Calls Messages that prior authorization for Jubbonti 60mg /ml is required/requested.   Insurance verification completed.   The patient is insured through [employer]   Per test claim: The current 180 day co-pay is, $644.99.  No PA needed at this time. This test claim was processed through [facility]- copay amounts may vary at other pharmacies due to pharmacy/plan contracts, or as the patient moves through the different stages of their insurance plan.     Test claim is successful, despite high copay. Insurance is covering $918.96. This could be due to a high deductible.

## 2024-04-26 NOTE — Telephone Encounter (Signed)
 Rx for Debra Richards 60mg  SQ every 6 months sent to [facility].

## 2024-04-26 NOTE — Telephone Encounter (Signed)
 Pt needs prior auth for Jubbonti 60mg .

## 2024-04-26 NOTE — Addendum Note (Signed)
 Addended by: CLAUDENE ZADA POUR on: 04/26/2024 03:04 PM   Modules accepted: Orders

## 2024-04-27 NOTE — Telephone Encounter (Signed)
 Spoke with representative with [facility], scheduled delivery of Jubbonti 60mg  to office on 05/09/24.

## 2024-05-04 ENCOUNTER — Encounter: Payer: Self-pay | Admitting: "Endocrinology

## 2024-05-04 ENCOUNTER — Ambulatory Visit: Admitting: "Endocrinology

## 2024-05-04 VITALS — BP 130/82 | HR 72 | Ht 64.0 in | Wt 143.4 lb

## 2024-05-04 DIAGNOSIS — M810 Age-related osteoporosis without current pathological fracture: Secondary | ICD-10-CM

## 2024-05-04 DIAGNOSIS — E039 Hypothyroidism, unspecified: Secondary | ICD-10-CM

## 2024-05-04 MED ORDER — DENOSUMAB-BBDZ 60 MG/ML ~~LOC~~ SOSY
60.0000 mg | PREFILLED_SYRINGE | SUBCUTANEOUS | Status: AC
  Administered 2024-05-04: 60 mg via SUBCUTANEOUS

## 2024-05-04 NOTE — Progress Notes (Signed)
 Pt visit today with Dr. Lenis and to receive Jubbonti  injection. Pt supplied Jubbonti  60mg  (Lot #[id number], Exp. Date 08/24/2025). Jubbonti  60mg  injected SQ in upper L arm without difficulty. Pt waited the required 15 minutes post injection, no adverse reactions noted.

## 2024-05-04 NOTE — Progress Notes (Signed)
 05/04/2024, 2:09 PM   Endocrinology follow-up note   Subjective:    Patient ID: Debra Richards, female    DOB: [birth year], PCP Rosamond Leta NOVAK, MD   Past Medical History:  Diagnosis Date   Arthritis    Complication of anesthesia    Trouble waking up   GERD (gastroesophageal reflux disease)    History of kidney stones    Hypothyroidism    LBBB (left bundle branch block)    MVP (mitral valve prolapse)    Osteoporosis    Palpitations    RA (rheumatoid arthritis) (HCC)    Right ureteral stone    Rosacea    Past Surgical History:  Procedure Laterality Date   BACK SURGERY     CATARACT EXTRACTION W/ INTRAOCULAR LENS  IMPLANT, BILATERAL  4 yrs ago   both eyes   CHOLECYSTECTOMY  2005   EXTRACORPOREAL SHOCK WAVE LITHOTRIPSY  09-24-10   and 1990   Femur fx     HIP ARTHROPLASTY Right 10/12/2012   Procedure: ARTHROPLASTY BIPOLAR HIP;  Surgeon: Dempsey LULLA Moan, MD;  Location: [facility];  Service: Orthopedics;  Laterality: Right;   KNEE ARTHROSCOPY  04/17/2011, right   Procedure: ARTHROSCOPY KNEE;  Surgeon: Dempsey LULLA Moan;  Location: [facility];  Service: Orthopedics;  Laterality: Right;  WITH LATERAL MENISCAL DEBRIDEMENT   LUMBAR LAMINECTOMY/DECOMPRESSION MICRODISCECTOMY Right 12/28/2019   Procedure: Right Lumbar Four-Lumbar Five Microdiscectomy;  Surgeon: Unice Pac, MD;  Location: [facility];  Service: Neurosurgery;  Laterality: Right;  Right Lumbar Four-Lumbar Five Microdiscectomy   MOLES EXCISED  2012   on nose   Sternum Fx     TOTAL KNEE ARTHROPLASTY  03/23/2012   Procedure: TOTAL KNEE ARTHROPLASTY;  Surgeon: Dempsey LULLA Moan, MD;  Location: [facility];  Service: Orthopedics;  Laterality: Right;   VAGINAL HYSTERECTOMY  1970   Leiomyomata   Social History   Socioeconomic History   Marital status: Widowed    Spouse name: Not on file   Number of children: Not on file   Years of education: Not on file   Highest  education level: Not on file  Occupational History   Not on file  Tobacco Use   Smoking status: Never   Smokeless tobacco: Never  Vaping Use   Vaping status: Never Used  Substance and Sexual Activity   Alcohol  use: No    Alcohol /week: 0.0 standard drinks of alcohol    Drug use: No   Sexual activity: Not Currently    Partners: Male    Birth control/protection: Post-menopausal, Surgical    Comment: 1st intercourse 69 yo-2 partners, hysterectomy  Other Topics Concern   Not on file  Social History Narrative   Lives in [place].   Lives c 2nd husband    NOK-Gina Axson   Social Drivers of Health   Financial Resource Strain: Not on file  Food Insecurity: Not on file  Transportation Needs: Not on file  Physical Activity: Not on file  Stress: Not on file  Social Connections: Not on file   Family History  Problem Relation Age of Onset   Cancer Mother        COLON   Heart disease Father  STROKE   Stroke Father    Ovarian cancer Maternal Grandmother    Crohn's disease Daughter    Outpatient Encounter Medications as of 05/04/2024  Medication Sig   aspirin  EC 81 MG tablet Take 81 mg by mouth daily. Swallow whole.   B Complex Vitamins (B-COMPLEX/B-12 PO) Take by mouth. occasionally   cholecalciferol  (VITAMIN D ) 1000 units tablet Take 1,000 Units by mouth daily.    denosumab -bbdz (JUBBONTI ) 60 MG/ML SOSY injection Inject 60 mg into the skin every 6 (six) months.   levothyroxine  (SYNTHROID ) 50 MCG tablet Take 50 mcg by mouth daily.   meclizine  (ANTIVERT ) 25 MG tablet Take 1 tablet (25 mg total) by mouth 2 (two) times daily as needed for dizziness.   midodrine  (PROAMATINE ) 5 MG tablet TAKE 1 TABLET BY MOUTH TWICE A DAY   ondansetron  (ZOFRAN -ODT) 4 MG disintegrating tablet Take 1 tablet (4 mg total) by mouth every 6 (six) hours as needed for nausea or vomiting.   predniSONE  (DELTASONE ) 10 MG tablet Take 10 mg by mouth 2 (two) times daily as needed.   VITAMIN E  PO Take 1 tablet by  mouth daily.   Zinc  50 MG CAPS Take 1 capsule by mouth daily.   No facility-administered encounter medications on file as of 05/04/2024.   ALLERGIES: Allergies  Allergen Reactions   Penicillins Hives    03/04/13: Pt has tolerated Amoxicillin Pascal without reaction   Contrast Media [Iodinated Contrast Media] Other (See Comments)    Arm swelled up and was painful after IVP years ago/    Levaquin  [Levofloxacin  In D5w] Other (See Comments)    Made her sick all over    VACCINATION STATUS: Immunization History  Administered Date(s) Administered   Influenza,inj,Quad PF,6+ Mos 03/01/2013    HPI Debra Richards is 89 y.o. female who presents today with a medical history as above. she is accompanied by her daughter, being seen in follow-up after she was seen in consultation for osteoporosis  requested by Rosamond Leta NOVAK, MD.  Patient is accompanied by her grown daughter to clinic.  History is obtained from the family as well as chart review.  Reportedly, she was diagnosed with osteoporosis more than 20 years ago.  She has taken various forms of antidepressant medications until she was switched to Prolia  5 years ago.  After a brief interruption, she was resumed on Prolia  in March 2025, returning to receive her next treatment.  She denies any interval falls or fractures.  She did not get her bone density yet.  She did not have any side effects from Prolia  during her prior treatments.     She reports various forms of fractures including hip fracture over the years. She does not have any planned dental work.  She does not have acute complaints today. She is active and ambulatory at baseline.  She reports various and diffuse large joint arthritic pains. She is also on intermittent prednisone  orally for pain control. She also has some disequilibrium with intermittent drop in her blood pressure currently on midodrine  5 mg twice daily. She does not have recent labs.  She also did not have recent bone  density, last bone density seems to be in from 2022.  Review of Systems  Constitutional: +mildly fluctuating body weight , no fatigue, no subjective hyperthermia, no subjective hypothermia Eyes: no blurry vision, no xerophthalmia ENT: no sore throat, no nodules palpated in throat, no dysphagia/odynophagia, no hoarseness   Objective:       05/04/2024    1:15 PM  03/10/2024   10:56 AM 12/23/2023   11:26 AM  Vitals with BMI  Height 5' 4 5' 4   Weight 143 lbs 6 oz 140 lbs 6 oz   BMI 24.6 24.09   Systolic 130 118 879  Diastolic 82 64 66  Pulse 72 53 62    BP 130/82   Pulse 72   Ht 5' 4 (1.626 m)   Wt 143 lb 6.4 oz (65 kg)   BMI 24.61 kg/m   Wt Readings from Last 3 Encounters:  05/04/24 143 lb 6.4 oz (65 kg)  03/10/24 140 lb 6.4 oz (63.7 kg)  11/03/23 146 lb 3.2 oz (66.3 kg)    Physical Exam  Constitutional:  Body mass index is 24.61 kg/m.,  not in acute distress, normal state of mind Eyes: PERRLA, EOMI, no exophthalmos ENT: moist mucous membranes, no gross thyromegaly, no gross cervical lymphadenopathy Musculoskeletal: Ambulates with mild disequilibrium  CMP ( most recent) CMP     Component Value Date/Time   NA 143 09/08/2023 1059   K 4.4 09/08/2023 1059   CL 106 09/08/2023 1059   CO2 25 09/08/2023 1059   GLUCOSE 84 09/08/2023 1059   GLUCOSE 99 12/24/2022 1645   BUN 17 09/08/2023 1059   CREATININE 0.99 09/08/2023 1059   CREATININE 0.83 04/05/2014 1147   CALCIUM  9.6 09/08/2023 1059   CALCIUM  9.6 01/22/2012 1411   PROT 5.8 (L) 09/08/2023 1059   ALBUMIN 4.0 09/08/2023 1059   AST 22 09/08/2023 1059   ALT 11 09/08/2023 1059   ALKPHOS 65 09/08/2023 1059   BILITOT 0.4 09/08/2023 1059   EGFR 53 (L) 09/08/2023 1059   GFRNONAA >60 12/24/2022 1645    Lab Results  Component Value Date   TSH 20.500 (H) 09/08/2023   TSH 6.103 (H) 12/25/2020   TSH 6.797 (H) 09/15/2020   TSH 2.180 02/17/2018   TSH 2.360 03/06/2016   FREET4 0.81 (L) 09/08/2023   FREET4 1.19 (H)  12/25/2020     Assessment & Plan:   1. Age-related osteoporosis without current pathological fracture (Primary)  - Cherrish Vitali  is being seen at a kind request of Vyas, Dhruv B, MD. - I have reviewed her available  records and clinically evaluated the patient. - Based on these reviews, she has osteoporosis treated with various locations over the years.  5 years ago, she was switched to Prolia  subcutaneously.  She has tolerated her treatment in March 2025, and will receive her next treatment today May 04, 2024. I discussed fracture risk with this patient particularly from Prolia  withdrawal.  She is due for her next bone density which she had at her PMD in [place], will get her repeat study and send us  a copy.   Her CMP and labs are favorable. -I discussed proper nutrition and fall precautions with the family.  According to patient's daughter, she refused to use any ambulatory support including canes if that she has various kinds of canes at home.  2.  Hypothyroidism: This is a recent diagnosis. She is currently on levothyroxine  50 mcg p.o. daily before breakfast.  She is advised to continue.  - she is advised to maintain close follow up with Rosamond Leta NOVAK, MD for primary care needs.   I spent  20  minutes in the care of the patient today including review of labs from Thyroid  Function, CMP, and other relevant labs ; imaging/biopsy records (current and previous including abstractions from other facilities); face-to-face time discussing  her lab results and symptoms,  medications doses, her options of short and long term treatment based on the latest standards of care / guidelines;   and documenting the encounter.  Debra Richards  participated in the discussions, expressed understanding, and voiced agreement with the above plans.  All questions were answered to her satisfaction. she is encouraged to contact clinic should she have any questions or concerns prior to her return visit.   Follow  up plan: Return in about 6 months (around 11/02/2024), or DXA anytime, for F/U with Pre-visit Labs, DXA Scan B4 NV, Prolia (Jubbonti ) Today (ASAP) and P(J) NV.   Ranny Earl, MD [facility] [address] [place], [place] [address] Phone: [phone]  Fax: [phone]     05/04/2024, 2:09 PM  This note was partially dictated with voice recognition software. Similar sounding words can be transcribed inadequately or may not  be corrected upon review.

## 2024-05-04 NOTE — Addendum Note (Signed)
 Addended by: CLAUDENE ZADA POUR on: 05/04/2024 03:17 PM   Modules accepted: Orders

## 2024-06-27 ENCOUNTER — Other Ambulatory Visit: Payer: Self-pay | Admitting: Cardiology

## 2024-07-06 ENCOUNTER — Ambulatory Visit: Admitting: Urology

## 2024-10-04 ENCOUNTER — Ambulatory Visit: Admitting: Urology

## 2024-11-03 ENCOUNTER — Ambulatory Visit: Admitting: "Endocrinology
